# Patient Record
Sex: Female | Born: 1945
Health system: Southern US, Community
[De-identification: ages and names within clinical notes are randomized; demographics above are authoritative.]

## PROBLEM LIST (undated history)

## (undated) DIAGNOSIS — E785 Hyperlipidemia, unspecified: Secondary | ICD-10-CM

## (undated) DIAGNOSIS — M797 Fibromyalgia: Secondary | ICD-10-CM

## (undated) DIAGNOSIS — F32A Depression, unspecified: Secondary | ICD-10-CM

## (undated) DIAGNOSIS — N189 Chronic kidney disease, unspecified: Secondary | ICD-10-CM

## (undated) DIAGNOSIS — R519 Headache, unspecified: Secondary | ICD-10-CM

## (undated) DIAGNOSIS — F419 Anxiety disorder, unspecified: Secondary | ICD-10-CM

## (undated) DIAGNOSIS — F329 Major depressive disorder, single episode, unspecified: Secondary | ICD-10-CM

## (undated) DIAGNOSIS — K635 Polyp of colon: Secondary | ICD-10-CM

## (undated) DIAGNOSIS — N8110 Cystocele, unspecified: Secondary | ICD-10-CM

## (undated) DIAGNOSIS — I10 Essential (primary) hypertension: Secondary | ICD-10-CM

## (undated) DIAGNOSIS — R51 Headache: Secondary | ICD-10-CM

## (undated) DIAGNOSIS — E119 Type 2 diabetes mellitus without complications: Secondary | ICD-10-CM

## (undated) HISTORY — DX: Depression, unspecified: F32.A

## (undated) HISTORY — DX: Cystocele, unspecified: N81.10

## (undated) HISTORY — DX: Headache: R51

## (undated) HISTORY — DX: Type 2 diabetes mellitus without complications: E11.9

## (undated) HISTORY — DX: Chronic kidney disease, unspecified: N18.9

## (undated) HISTORY — DX: Hyperlipidemia, unspecified: E78.5

## (undated) HISTORY — DX: Polyp of colon: K63.5

## (undated) HISTORY — DX: Fibromyalgia: M79.7

## (undated) HISTORY — DX: Headache, unspecified: R51.9

## (undated) HISTORY — DX: Essential (primary) hypertension: I10

## (undated) HISTORY — DX: Anxiety disorder, unspecified: F41.9

---
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
## 1967-08-05 HISTORY — PX: WRIST SURGERY: SHX841

## 1998-07-19 ENCOUNTER — Other Ambulatory Visit: Admission: RE | Admit: 1998-07-19 | Discharge: 1998-07-19 | Payer: Self-pay | Admitting: *Deleted

## 1999-01-30 ENCOUNTER — Ambulatory Visit (HOSPITAL_COMMUNITY): Admission: RE | Admit: 1999-01-30 | Discharge: 1999-01-30 | Payer: Self-pay | Admitting: Gastroenterology

## 1999-07-22 ENCOUNTER — Other Ambulatory Visit: Admission: RE | Admit: 1999-07-22 | Discharge: 1999-07-22 | Payer: Self-pay | Admitting: *Deleted

## 2000-02-14 ENCOUNTER — Other Ambulatory Visit: Admission: RE | Admit: 2000-02-14 | Discharge: 2000-02-14 | Payer: Self-pay | Admitting: *Deleted

## 2000-08-11 ENCOUNTER — Other Ambulatory Visit: Admission: RE | Admit: 2000-08-11 | Discharge: 2000-08-11 | Payer: Self-pay | Admitting: *Deleted

## 2001-09-06 ENCOUNTER — Other Ambulatory Visit: Admission: RE | Admit: 2001-09-06 | Discharge: 2001-09-06 | Payer: Self-pay | Admitting: *Deleted

## 2002-09-08 ENCOUNTER — Other Ambulatory Visit: Admission: RE | Admit: 2002-09-08 | Discharge: 2002-09-08 | Payer: Self-pay | Admitting: *Deleted

## 2003-04-21 ENCOUNTER — Encounter: Payer: Self-pay | Admitting: Gastroenterology

## 2003-04-21 ENCOUNTER — Encounter: Admission: RE | Admit: 2003-04-21 | Discharge: 2003-04-21 | Payer: Self-pay | Admitting: Gastroenterology

## 2003-05-12 ENCOUNTER — Ambulatory Visit (HOSPITAL_COMMUNITY): Admission: RE | Admit: 2003-05-12 | Discharge: 2003-05-12 | Payer: Self-pay | Admitting: Gastroenterology

## 2007-08-19 ENCOUNTER — Other Ambulatory Visit: Admission: RE | Admit: 2007-08-19 | Discharge: 2007-08-19 | Payer: Self-pay | Admitting: Family Medicine

## 2008-08-14 LAB — CONVERTED CEMR LAB: Pap Smear: NORMAL

## 2008-10-31 ENCOUNTER — Other Ambulatory Visit: Admission: RE | Admit: 2008-10-31 | Discharge: 2008-10-31 | Payer: Self-pay | Admitting: Family Medicine

## 2009-01-04 ENCOUNTER — Encounter: Admission: RE | Admit: 2009-01-04 | Discharge: 2009-01-04 | Payer: Self-pay | Admitting: Family Medicine

## 2009-07-25 ENCOUNTER — Encounter: Payer: Self-pay | Admitting: Family Medicine

## 2009-07-25 ENCOUNTER — Ambulatory Visit: Payer: Self-pay | Admitting: Family Medicine

## 2009-07-25 DIAGNOSIS — I152 Hypertension secondary to endocrine disorders: Secondary | ICD-10-CM | POA: Insufficient documentation

## 2009-07-25 DIAGNOSIS — I10 Essential (primary) hypertension: Secondary | ICD-10-CM | POA: Insufficient documentation

## 2009-07-25 DIAGNOSIS — E1129 Type 2 diabetes mellitus with other diabetic kidney complication: Secondary | ICD-10-CM | POA: Insufficient documentation

## 2009-07-25 DIAGNOSIS — N952 Postmenopausal atrophic vaginitis: Secondary | ICD-10-CM

## 2009-07-25 DIAGNOSIS — E785 Hyperlipidemia, unspecified: Secondary | ICD-10-CM

## 2009-07-25 DIAGNOSIS — Z860100 Personal history of colon polyps, unspecified: Secondary | ICD-10-CM | POA: Insufficient documentation

## 2009-07-25 DIAGNOSIS — M79609 Pain in unspecified limb: Secondary | ICD-10-CM

## 2009-07-26 LAB — CONVERTED CEMR LAB
AST: 19 units/L (ref 0–37)
Albumin: 3.8 g/dL (ref 3.5–5.2)
Chloride: 110 meq/L (ref 96–112)
Cholesterol: 125 mg/dL (ref 0–200)
GFR calc non Af Amer: 59.77 mL/min (ref >60)
Hgb A1c MFr Bld: 6.6 % — ABNORMAL HIGH (ref 4.6–6.5)
LDL Cholesterol: 56 mg/dL (ref 0–99)
Potassium: 4.7 meq/L (ref 3.5–5.1)
Sodium: 144 meq/L (ref 135–145)
Total CHOL/HDL Ratio: 4
Total Protein: 7.5 g/dL (ref 6.0–8.3)

## 2009-11-21 ENCOUNTER — Ambulatory Visit: Payer: Self-pay | Admitting: Family Medicine

## 2009-11-21 DIAGNOSIS — R5381 Other malaise: Secondary | ICD-10-CM | POA: Insufficient documentation

## 2009-11-21 DIAGNOSIS — R5383 Other fatigue: Secondary | ICD-10-CM

## 2009-11-21 DIAGNOSIS — G4700 Insomnia, unspecified: Secondary | ICD-10-CM

## 2009-11-21 DIAGNOSIS — F5104 Psychophysiologic insomnia: Secondary | ICD-10-CM | POA: Insufficient documentation

## 2009-11-21 LAB — CONVERTED CEMR LAB
Basophils Absolute: 0 10*3/uL (ref 0.0–0.1)
CO2: 30 meq/L (ref 19–32)
Calcium: 10 mg/dL (ref 8.4–10.5)
Chloride: 111 meq/L (ref 96–112)
Creatinine, Ser: 1 mg/dL (ref 0.4–1.2)
Eosinophils Relative: 5.9 % — ABNORMAL HIGH (ref 0.0–5.0)
Glucose, Bld: 91 mg/dL (ref 70–99)
HCT: 35.3 % — ABNORMAL LOW (ref 36.0–46.0)
Hemoglobin: 12.1 g/dL (ref 12.0–15.0)
Hgb A1c MFr Bld: 6.6 % — ABNORMAL HIGH (ref 4.6–6.5)
Lymphs Abs: 1.7 10*3/uL (ref 0.7–4.0)
MCHC: 34.3 g/dL (ref 30.0–36.0)
Monocytes Relative: 8.7 % (ref 3.0–12.0)
Neutrophils Relative %: 60.9 % (ref 43.0–77.0)
Platelets: 284 10*3/uL (ref 150.0–400.0)
Potassium: 5.7 meq/L — ABNORMAL HIGH (ref 3.5–5.1)
RDW: 13.7 % (ref 11.5–14.6)
Sodium: 148 meq/L — ABNORMAL HIGH (ref 135–145)
TSH: 3.28 microintl units/mL (ref 0.35–5.50)
WBC: 7.3 10*3/uL (ref 4.5–10.5)

## 2009-11-30 ENCOUNTER — Ambulatory Visit: Payer: Self-pay | Admitting: Family Medicine

## 2009-11-30 DIAGNOSIS — E875 Hyperkalemia: Secondary | ICD-10-CM

## 2009-12-04 LAB — CONVERTED CEMR LAB
BUN: 17 mg/dL (ref 6–23)
Chloride: 107 meq/L (ref 96–112)
Creatinine, Ser: 0.9 mg/dL (ref 0.4–1.2)
Potassium: 5 meq/L (ref 3.5–5.1)

## 2010-01-02 ENCOUNTER — Ambulatory Visit: Payer: Self-pay | Admitting: Family Medicine

## 2010-01-02 DIAGNOSIS — J209 Acute bronchitis, unspecified: Secondary | ICD-10-CM | POA: Insufficient documentation

## 2010-01-09 ENCOUNTER — Telehealth: Payer: Self-pay | Admitting: Family Medicine

## 2010-02-11 ENCOUNTER — Telehealth: Payer: Self-pay | Admitting: Family Medicine

## 2010-02-12 ENCOUNTER — Encounter: Payer: Self-pay | Admitting: Family Medicine

## 2010-02-20 ENCOUNTER — Telehealth (INDEPENDENT_AMBULATORY_CARE_PROVIDER_SITE_OTHER): Payer: Self-pay | Admitting: *Deleted

## 2010-02-21 ENCOUNTER — Ambulatory Visit: Payer: Self-pay | Admitting: Family Medicine

## 2010-02-21 ENCOUNTER — Telehealth (INDEPENDENT_AMBULATORY_CARE_PROVIDER_SITE_OTHER): Payer: Self-pay | Admitting: *Deleted

## 2010-02-24 LAB — CONVERTED CEMR LAB
Bilirubin, Direct: 0.1 mg/dL (ref 0.0–0.3)
Chloride: 110 meq/L (ref 96–112)
Cholesterol: 169 mg/dL (ref 0–200)
Creatinine,U: 230.8 mg/dL
Glucose, Bld: 125 mg/dL — ABNORMAL HIGH (ref 70–99)
Hgb A1c MFr Bld: 7.1 % — ABNORMAL HIGH (ref 4.6–6.5)
LDL Cholesterol: 97 mg/dL (ref 0–99)
Microalb, Ur: 2.6 mg/dL — ABNORMAL HIGH (ref 0.0–1.9)
Potassium: 4.8 meq/L (ref 3.5–5.1)
Sodium: 145 meq/L (ref 135–145)
Total CHOL/HDL Ratio: 5
Triglycerides: 197 mg/dL — ABNORMAL HIGH (ref 0.0–149.0)
VLDL: 39.4 mg/dL (ref 0.0–40.0)

## 2010-02-28 ENCOUNTER — Ambulatory Visit: Payer: Self-pay | Admitting: Family Medicine

## 2010-02-28 ENCOUNTER — Other Ambulatory Visit: Admission: RE | Admit: 2010-02-28 | Discharge: 2010-02-28 | Payer: Self-pay | Admitting: Family Medicine

## 2010-03-06 ENCOUNTER — Encounter: Payer: Self-pay | Admitting: Family Medicine

## 2010-03-06 LAB — CONVERTED CEMR LAB: Pap Smear: NEGATIVE

## 2010-05-09 ENCOUNTER — Encounter: Payer: Self-pay | Admitting: Family Medicine

## 2010-05-31 ENCOUNTER — Ambulatory Visit: Payer: Self-pay | Admitting: Family Medicine

## 2010-05-31 LAB — CONVERTED CEMR LAB
HDL: 30.8 mg/dL — ABNORMAL LOW (ref >39.00)
Hgb A1c MFr Bld: 7.1 % — ABNORMAL HIGH (ref 4.6–6.5)
Total CHOL/HDL Ratio: 6
Triglycerides: 140 mg/dL (ref 0.0–149.0)

## 2010-06-03 ENCOUNTER — Ambulatory Visit: Payer: Self-pay | Admitting: Family Medicine

## 2010-07-10 ENCOUNTER — Encounter: Payer: Self-pay | Admitting: Family Medicine

## 2010-07-25 ENCOUNTER — Telehealth: Payer: Self-pay | Admitting: Family Medicine

## 2010-08-06 ENCOUNTER — Ambulatory Visit
Admission: RE | Admit: 2010-08-06 | Discharge: 2010-08-06 | Payer: Self-pay | Source: Home / Self Care | Attending: Internal Medicine | Admitting: Internal Medicine

## 2010-08-06 DIAGNOSIS — M542 Cervicalgia: Secondary | ICD-10-CM | POA: Insufficient documentation

## 2010-08-16 ENCOUNTER — Ambulatory Visit
Admission: RE | Admit: 2010-08-16 | Discharge: 2010-08-16 | Payer: Self-pay | Source: Home / Self Care | Attending: Family Medicine | Admitting: Family Medicine

## 2010-08-21 ENCOUNTER — Ambulatory Visit (HOSPITAL_COMMUNITY)
Admission: RE | Admit: 2010-08-21 | Discharge: 2010-08-21 | Payer: Self-pay | Source: Home / Self Care | Attending: Family Medicine | Admitting: Family Medicine

## 2010-08-22 ENCOUNTER — Telehealth: Payer: Self-pay | Admitting: Family Medicine

## 2010-09-03 NOTE — Assessment & Plan Note (Signed)
Summary: PAP SMEAR AND CPX/CLE   Vital Signs:  Patient profile:   65 year old female Height:      64 inches Weight:      136 pounds BMI:     23.43 Temp:     97.6 degrees F oral Pulse rate:   60 / minute Pulse rhythm:   regular BP sitting:   100 / 66  (right arm) Cuff size:   regular  Vitals Entered By: Linde Gillis CMA Duncan Dull) (February 28, 2010 9:08 AM) CC: complete physical with pap   History of Present Illness: 65 yo here for CPX, pap.    DM II- diagnosed 10 years ago.  On Metformin 1500 mg daily, Januvia 100 mg daily, Glipizide 2.5 mg daily. Dilated eye exam 09/2007, no retinopathy. Hga1c increased this month to 7.1 (was 6.6 in 11/2009).    Had pneumovax on 08/19/2007. Admits to not walking as much or watching her diet.    HTN- at goal for diabetic. On Lisinopril 10 mg daily. No CP, SOB, blurred vision or headaches.  HLD- Stopped taking Simvastatin on her own 3 weeks ago because of myalgias.  Improved since she stopped taking it.  TG elevated, HDL low.  Well woman- no h/o abnormal pap smears.  Mammogram normal this month. Due for zostavax.  Current Medications (verified): 1)  Metformin Hcl 1000 Mg Tabs (Metformin Hcl) .... Take One Tablet By Mouth Once Daily 2)  Metformin Hcl 500 Mg Tabs (Metformin Hcl) .... Take One Tablet By Mouth Once Daily 3)  Januvia 100 Mg Tabs (Sitagliptin Phosphate) .... Take One Tablet By Mouth Once Daily 4)  Glipizide Xl 2.5 Mg Xr24h-Tab (Glipizide) .... Take One Tablet By Mouth Once Daily 5)  Lisinopril 10 Mg Tabs (Lisinopril) .... Take One Tablet By Mouth Once Daily 6)  Lorazepam 0.5 Mg Tabs (Lorazepam) .Marland Kitchen.. 1 Tab By Mouth At Bedtime As Needed Insomnia 7)  Niaspan 500 Mg Tbcr (Niacin (Antihyperlipidemic)) .Marland Kitchen.. 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 Never Smoked Drug use-no Regular exercise-yes  Risk Factors: Exercise: yes (07/25/2009)  Risk Factors: Smoking Status: never (07/25/2009)  Review of Systems      See HPI General:  Denies malaise. Eyes:  Denies blurring. ENT:  Denies difficulty swallowing. CV:  Denies chest pain or discomfort. Resp:  Denies shortness of breath. GI:  Denies abdominal pain, nausea, and vomiting. GU:  Denies abnormal vaginal bleeding, discharge, and dysuria. MS:  Denies joint pain, joint redness, and joint swelling. Derm:  Denies rash. Neuro:  Denies headaches. Psych:  Denies anxiety and depression. Endo:  Denies cold intolerance, excessive thirst, excessive urination, and heat intolerance. Heme:  Denies abnormal bruising and bleeding.  Physical Exam  General:  Well-developed,well-nourished,in no acute distress; alert,appropriate and cooperative throughout examination  Head:  Normocephalic and atraumatic without obvious abnormalities. No apparent alopecia or balding. Eyes:  vision grossly intact, pupils equal, pupils round, and pupils reactive to light.   Ears:  R ear normal and L ear normal.   Nose:  no external deformity.   Mouth:  good dentition.   Neck:  No deformities, masses, or tenderness noted. Chest Wall:  No deformities, masses, or tenderness noted. Breasts:  No mass, nodules, thickening, tenderness, bulging, retraction, inflamation, nipple discharge or skin changes noted.   Lungs:  Normal respiratory effort, chest expands symmetrically. Lungs are clear to auscultation, no crackles or wheezes. Heart:  Normal rate and regular rhythm. S1 and S2 normal without gallop, murmur, click, rub or other extra  sounds. Abdomen:  Bowel sounds positive,abdomen soft and non-tender without masses, organomegaly or hernias noted. Rectal:  no external abnormalities, no hemorrhoids, and normal sphincter tone.   Genitalia:  Pelvic Exam:        External: normal female genitalia without lesions or masses        Vagina: normal without lesions or masses        Cervix: normal without lesions or masses        Adnexa: normal bimanual exam without masses or fullness        Uterus: normal by palpation        Pap smear: performed Msk:  No deformity or scoliosis noted of thoracic or lumbar spine.   Feet- callusnormal ROM, no joint tenderness, and no joint swelling.   Extremities:  no edema Neurologic:  alert & oriented X3 and gait normal.   Skin:  Intact without suspicious lesions or rashes Psych:  Cognition and judgment appear intact. Alert and cooperative with normal attention span and concentration. No apparent delusions, illusions, hallucinations  Diabetes Management Exam:    Foot Exam (with socks and/or shoes not present):       Sensory-Pinprick/Light touch:          Left medial foot (L-4): normal          Left dorsal foot (L-5): normal          Left lateral foot (S-1): normal          Right medial foot (L-4): normal          Right dorsal foot (L-5): normal          Right lateral foot (S-1): normal       Sensory-Monofilament:          Left foot: normal          Right foot: normal       Inspection:          Left foot: normal          Right foot: normal       Nails:          Left foot: normal          Right foot: normal   Impression & Recommendations:  Problem # 1:  Preventive Health Care (ICD-V70.0) Reviewed preventive care protocols, scheduled due services, and updated immunizations Discussed nutrition, exercise, diet, and healthy lifestyle.  Set up DEXA today. Pap today.  Problem # 2:  DIABETES MELLITUS, TYPE II (ICD-250.00) Assessment: Deteriorated Discussed dietary changes, follow up in  one month. Her updated medication list for this problem includes:    Metformin Hcl 1000 Mg Tabs (Metformin hcl) .Marland Kitchen... Take one tablet by mouth once daily    Metformin Hcl 500 Mg Tabs (Metformin hcl) .Marland Kitchen... Take one tablet by mouth once daily    Januvia 100 Mg Tabs (Sitagliptin phosphate) .Marland Kitchen... Take one tablet by mouth once daily    Glipizide Xl 2.5 Mg Xr24h-tab (Glipizide) .Marland Kitchen... Take one tablet by mouth once daily    Lisinopril 10 Mg Tabs (Lisinopril) .Marland Kitchen... Take one tablet by mouth  once daily  Problem # 3:  HYPERLIPIDEMIA (ICD-272.4) Assessment: Unchanged Stop Simvastatin (pt already stopped it). Start Niaspan, follow up in one month. The following medications were removed from the medication list:    Simvastatin 40 Mg Tabs (Simvastatin) .Marland Kitchen... Take one table by mouth once daily Her updated medication list for this problem includes:    Niaspan 500 Mg Tbcr (Niacin (antihyperlipidemic)) .Marland Kitchen... 1 tab by mouth nightly  Complete Medication List: 1)  Metformin Hcl 1000 Mg Tabs (Metformin hcl) .... Take one tablet by mouth once daily 2)  Metformin Hcl 500 Mg Tabs (Metformin hcl) .... Take one tablet by mouth once daily 3)  Januvia 100 Mg Tabs (Sitagliptin phosphate) .... Take one tablet by mouth once daily 4)  Glipizide Xl 2.5 Mg Xr24h-tab (Glipizide) .... Take one tablet by mouth once daily 5)  Lisinopril 10 Mg Tabs (Lisinopril) .... Take one tablet by mouth once daily 6)  Lorazepam 0.5 Mg Tabs (Lorazepam) .Marland Kitchen.. 1 tab by mouth at bedtime as needed insomnia 7)  Niaspan 500 Mg Tbcr (Niacin (antihyperlipidemic)) .Marland Kitchen.. 1 tab by mouth nightly  Other Orders: Radiology Referral (Radiology)  Patient Instructions: 1)  Start taking Niaspan. 2)  Come in to see me in 3 months, fasting labs prior to appointment (a1c 250.00), fasting lipid panel (272.4). 3)  Stop by to see Aram Beecham on your way out to set up your bone density. 4)    Decrease added sugars, eliminate trans fats, increase fiber and limit alcohol.   All these changes together can drop cholesterol  by almost 50%. Prescriptions: NIASPAN 500 MG TBCR (NIACIN (ANTIHYPERLIPIDEMIC)) 1 tab by mouth nightly  #30 x 1   Entered and Authorized by:   Ruthe Mannan MD   Signed by:   Ruthe Mannan MD on 02/28/2010   Method used:   Electronically to        [facility]* (retail)       [address] [place], [place]  [address]       Ph: [phone] or [phone]       Fax: [phone]   RxID:   [id number]   Current Allergies (reviewed today): No known allergies   Last Mammogram:  normal (10/13/2008 11:43:21 AM) Mammogram Result Date:  02/12/2010 Mammogram Result:  normal Mammogram Next Due:  1 yr     Prevention & Chronic Care Immunizations   Influenza vaccine: Not documented    Tetanus booster: 08/19/2007: historical   Tetanus booster due: 08/18/2017    Pneumococcal vaccine: Not documented    H. zoster vaccine: Not documented   H. zoster vaccine deferral: Deferred  (02/28/2010)  Colorectal Screening   Hemoccult: Not documented   Hemoccult due: Not Indicated    Colonoscopy: polyps  (01/26/2008)   Colonoscopy due: 01/25/2013  Other Screening   Pap smear: normal  (08/14/2008)   Pap smear action/deferral: Ordered  (02/28/2010)   Pap smear due: 08/14/2010    Mammogram: normal  (02/12/2010)   Mammogram due: 02/13/2011    DXA bone density scan: Not documented   DXA bone density action/deferral: Ordered  (02/28/2010)   Smoking status: never  (07/25/2009)  Diabetes Mellitus   HgbA1C: 7.1  (02/21/2010)   Hemoglobin A1C due: 05/24/2010    Eye exam: normal  (11/10/2007)   Eye exam due: 11/2008    Foot exam: yes  (02/28/2010)   High risk foot: Not documented  Foot care education: Not documented   Foot exam due: 07/25/2010    Urine microalbumin/creatinine ratio: 1.1  (02/21/2010)   Urine microalbumin/cr due: 02/22/2011    Diabetes flowsheet reviewed?:  Yes   Progress toward A1C goal: Deteriorated  Lipids   Total Cholesterol: 169  (02/21/2010)   LDL: 97  (02/21/2010)   LDL Direct: Not documented   HDL: 32.40  (02/21/2010)   Triglycerides: 197.0  (02/21/2010)    SGOT (AST): 20  (02/21/2010)   SGPT (ALT): 20  (02/21/2010)   Alkaline phosphatase: 67  (02/21/2010)   Total bilirubin: 0.4  (02/21/2010)    Lipid flowsheet reviewed?: Yes   Progress toward LDL goal: Deteriorated    Stage of readiness to change (lipid management): Action  Hypertension   Last Blood Pressure: 100 / 66  (02/28/2010)   Serum creatinine: 0.9  (02/21/2010)   Serum potassium 4.8  (02/21/2010)  Self-Management Support :    Diabetes self-management support: Not documented    Hypertension self-management support: Not documented    Lipid self-management support: Not documented    Nursing Instructions: Pap smear today Schedule screening DXA bone density scan (see order)

## 2010-09-03 NOTE — Progress Notes (Signed)
Summary: wants to stop simvastatin  Phone Note Call from Patient Call back at Home Phone [phone]   Caller: Patient Call For: Ruthe Mannan MD Summary of Call: Pt has been having a lot of muscle pain and wants to stop taking her simvastin to see if the pain improves.  She has an appt with you at the end of the month and will follow up then.  Please advise. Initial call taken by: Lowella Petties CMA,  February 11, 2010 4:36 PM  Follow-up for Phone Call        ok, stopping it for 2 weeks should not be a problem. Ruthe Mannan MD  February 12, 2010 7:33 AM  Left message on machine for patient to return call.  Linde Gillis CMA Duncan Dull)  February 12, 2010 7:59 AM   Left message with spouse to have patient return call.  Linde Gillis CMA Duncan Dull)  February 12, 2010 2:09 PM   Additional Follow-up for Phone Call Additional follow up Details #1::        Advised pt. Additional Follow-up by: Lowella Petties CMA,  February 12, 2010 3:53 PM

## 2010-09-03 NOTE — Progress Notes (Signed)
----   Converted from flag ---- ---- 02/21/2010 9:18 AM, Ruthe Mannan MD wrote: yes  ---- 02/21/2010 9:06 AM, Liane Comber CMA (AAMA) wrote: pt asks about having a urine microalbumin done, is this ok? we did collect urine just in case. Thanks ------------------------------

## 2010-09-03 NOTE — Assessment & Plan Note (Signed)
Summary: 3 M F/U DLO   Vital Signs:  Patient profile:   65 year old female Height:      64 inches Weight:      139 pounds BMI:     23.95 Temp:     98.2 degrees F oral Pulse rate:   64 / minute Pulse rhythm:   regular BP sitting:   102 / 64  (left arm) Cuff size:   regular  Vitals Entered By: Linde Gillis CMA Duncan Dull) (June 03, 2010 9:04 AM) CC: 3 month follow up   History of Present Illness: 65 yo here for follow up DM, HLD. DM II- diagnosed 10 years ago.  On Metformin 500 mg daily, Januvia 100 mg daily, Glipizide 2.5 mg daily. Dilated eye exam 09/2007, no retinopathy. Hga1c remains elevated at 7.1.    Had pneumovax on 08/19/2007. Admits to not walking as much or watching her diet.    HTN- at goal for a diabetic. On Lisinopril 10 mg daily. No CP, SOB, blurred vision or headaches.  HLD-Could not tolerate Niaspan or statins.  LDL not at goal for a diabetic.   Admits to not eating well,under a great deal of stress at work.  Wants to retired early and hopes this will help her lipids and diabetes management.    Current Medications (verified): 1)  Metformin Hcl 500 Mg Tabs (Metformin Hcl) .... Take One Tablet By Mouth Two Times A Day 2)  Januvia 100 Mg Tabs (Sitagliptin Phosphate) .... Take One Tablet By Mouth Once Daily 3)  Glipizide Xl 2.5 Mg Xr24h-Tab (Glipizide) .... Take One Tablet By Mouth Once Daily 4)  Lisinopril 10 Mg Tabs (Lisinopril) .... Take One Tablet By Mouth Once Daily 5)  Lorazepam 0.5 Mg Tabs (Lorazepam) .Marland Kitchen.. 1 Tab By Mouth At Bedtime As Needed Insomnia  Allergies (verified): 1)  ! 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 Never Smoked Drug use-no Regular exercise-yes  Risk Factors: Exercise: yes (07/25/2009)  Risk Factors: Smoking Status: never (07/25/2009)  Review of Systems      See HPI General:  Denies malaise. Eyes:  Denies blurring. ENT:  Denies difficulty swallowing. CV:  Denies chest pain or discomfort. GU:  Denies urinary frequency and urinary hesitancy.  Physical Exam  General:  Well-developed,well-nourished,in no acute distress; alert,appropriate and cooperative throughout examination  Head:  Normocephalic and atraumatic without obvious abnormalities. No apparent alopecia or balding. Eyes:  vision grossly intact, pupils equal, pupils round, and pupils reactive to light.   Ears:  R ear normal and L ear normal.   Nose:  no external deformity.   Mouth:  good dentition.   Lungs:  Normal respiratory effort, chest expands symmetrically. Lungs are clear to auscultation, no crackles or wheezes. Heart:  Normal rate and regular rhythm. S1 and S2 normal without gallop, murmur, click, rub or other extra sounds. Extremities:  no edema Psych:  Cognition and judgment appear intact. Alert and cooperative with normal attention span and concentration. No apparent delusions, illusions, hallucinations   Impression & Recommendations:  Problem # 1:  DIABETES MELLITUS, TYPE II (ICD-250.00) Assessment Unchanged Still not at goal.  Will increase Metformin to 500 mg  two times a day.  Follow up in 3 months.  The following medications were removed from the medication list:    Metformin Hcl 1000 Mg Tabs (Metformin hcl) .Marland Kitchen... Take one tablet by mouth once daily Her updated medication list for this problem includes:    Metformin Hcl 500 Mg Tabs (Metformin hcl) .Marland Kitchen... Take one tablet by mouth two times a day    Januvia 100 Mg Tabs (Sitagliptin phosphate) .Marland Kitchen... Take one tablet by mouth once daily    Glipizide Xl 2.5 Mg Xr24h-tab  (Glipizide) .Marland Kitchen... Take one tablet by mouth once daily    Lisinopril 10 Mg Tabs (Lisinopril) .Marland Kitchen... Take one tablet by mouth once daily  Problem # 2:  HYPERTENSION (ICD-401.9) Assessment: Unchanged At goal, continue Lisinopril 10 mg daily.   Her updated medication list for this problem includes:    Lisinopril 10 Mg Tabs (Lisinopril) .Marland Kitchen... Take one tablet by mouth once daily  Problem # 3:  HYPERLIPIDEMIA (ICD-272.4) Assessment: Deteriorated Could not tolerate statins or Niacin.  Wants to work on diet.  Follow up in 3 months. The following medications were removed from the medication list:    Niaspan 500 Mg Tbcr (Niacin (antihyperlipidemic)) .Marland Kitchen... 1 tab by mouth nightly  Complete Medication List: 1)  Metformin Hcl 500 Mg Tabs (Metformin hcl) .... Take one tablet by mouth two times a day 2)  Januvia 100 Mg Tabs (Sitagliptin phosphate) .... Take one tablet by mouth once daily 3)  Glipizide Xl 2.5 Mg Xr24h-tab (Glipizide) .... Take one tablet by mouth once daily 4)  Lisinopril 10 Mg Tabs (Lisinopril) .... Take one tablet by mouth once daily 5)  Lorazepam 0.5 Mg Tabs (Lorazepam) .Marland Kitchen.. 1 tab by mouth at bedtime as needed insomnia  Patient Instructions: 1)  please make an appointment to come see me in 3 months. Prescriptions: METFORMIN HCL 500 MG TABS (METFORMIN HCL) take one tablet by mouth two times a day  #60 x 3   Entered and Authorized by:   Ruthe Mannan MD   Signed by:   Ruthe Mannan MD on 06/03/2010   Method used:   Electronically to        [facility]* (retail)       [address] [place], [place]  [address]       Ph: [phone] or [phone]       Fax: [phone]   RxID:   [id number]    Orders Added: 1)  Est. Patient Level IV [50093]    Current Allergies (reviewed today): ! NIACIN

## 2010-09-03 NOTE — Miscellaneous (Signed)
Summary: Controlled Substance Agreement  Controlled Substance Agreement   Imported By: Lanelle Bal 01/09/2010 08:38:44  _____________________________________________________________________  External Attachment:    Type:   Image     Comment:   External Document

## 2010-09-03 NOTE — Miscellaneous (Signed)
Summary: Vaccine Record/[facility]  Vaccine Record/[facility]   Imported By: Lanelle Bal 08/07/2009 08:58:54  _____________________________________________________________________  External Attachment:    Type:   Image     Comment:   External Document

## 2010-09-03 NOTE — Letter (Signed)
Summary: Records Dated 12-16-06 thru 02-09-08/[facility]   Records Dated 12-16-06 thru 02-09-08/[facility]   Imported By: Lanelle Bal 08/07/2009 08:57:21  _____________________________________________________________________  External Attachment:    Type:   Image     Comment:   External Document

## 2010-09-03 NOTE — Progress Notes (Signed)
Summary: still has cough  Phone Note Call from Patient Call back at Work Phone [phone]   Caller: Patient Call For: Ruthe Mannan MD Summary of Call: Pt was seen for bronchitis on 6/1.  She is better but still has cough with yellow mucous.  No fever.  Should she have more abx?  Uses [facility]. Initial call taken by: Lowella Petties CMA,  January 09, 2010 10:50 AM  Follow-up for Phone Call        I would not take more abx.  Can take over a month for cough to resolve. Ruthe Mannan MD  January 09, 2010 10:54 AM   Additional Follow-up for Phone Call Additional follow up Details #1::        Left message on home number for pt to call.  I tried work number multiple times but it was always busy.   Lowella Petties CMA  January 09, 2010 4:27 PM   Advised pt.              Lowella Petties CMA  January 10, 2010 8:28 AM

## 2010-09-03 NOTE — Letter (Signed)
Summary: Results Follow up Letter  [place] [facility]  [address] [place], [place] [address]   Phone: [phone]  Fax: [phone]    03/06/2010 MRN: [id number]  [facility] [address] [place], [place]  [address]  Dear Ms. Hagemann,  The following are the results of your recent test(s):  Test         Result    Pap Smear:        Normal __X___  Not Normal _____ Comments: ______________________________________________________ Cholesterol: LDL(Bad cholesterol):         Your goal is less than:         HDL (Good cholesterol):       Your goal is more than: Comments:  ______________________________________________________ Mammogram:        Normal _____  Not Normal _____ Comments:  ___________________________________________________________________ Hemoccult:        Normal _____  Not normal _______ Comments:    _____________________________________________________________________ Other Tests:    We routinely do not discuss normal results over the telephone.  If you desire a copy of the results, or you have any questions about this information we can discuss them at your next office visit.   Sincerely,      Ruthe Mannan, MD

## 2010-09-03 NOTE — Progress Notes (Signed)
----   Converted from flag ---- ---- 02/20/2010 4:32 PM, Ruthe Mannan MD wrote: BMET (401.9), flp (272.4), a1c (250.00), liver function tests (272.4)  ---- 02/20/2010 1:07 PM, Mills Koller wrote: Patient is coming in the AM for fasting CPX labs, need orders and dx.Thanks, Terri ------------------------------

## 2010-09-03 NOTE — Letter (Signed)
Summary: Marlaine Hind MD Optometrist  Marlaine Hind MD Optometrist   Imported By: Lanelle Bal 05/21/2010 09:39:53  _____________________________________________________________________  External Attachment:    Type:   Image     Comment:   External Document

## 2010-09-03 NOTE — Miscellaneous (Signed)
Summary: freestyle test strips  Clinical Lists Changes  Medications: Added new medication of * FREESTYLE FREEDOM LYTE TEST STRIPS check blood sugar once a day - Signed Rx of FREESTYLE FREEDOM LYTE TEST STRIPS check blood sugar once a day;  #100 x prn;  Signed;  Entered by: Lowella Petties CMA, AAMA;  Authorized by: Lowella Petties CMA, AAMA;  Method used: Telephoned to [facility]*, [address], [place], [place]  [address], Ph: [phone] or [phone], Fax: [phone]    Prescriptions: FREESTYLE FREEDOM LYTE TEST STRIPS check blood sugar once a day  #100 x prn   Entered and Authorized by:   Lowella Petties CMA, AAMA   Signed by:   Lowella Petties CMA, AAMA on 07/10/2010   Method used:   Telephoned to ...       [facility]* (retail)       [address] [place], [place]  [address]       Ph: [phone] or [phone]       Fax: [phone]   RxID:   [id number]   Prior Medications: METFORMIN HCL 500 MG TABS (METFORMIN HCL) take one tablet by mouth two times a day JANUVIA 100 MG TABS (SITAGLIPTIN PHOSPHATE) take one tablet by mouth once daily GLIPIZIDE XL 2.5 MG XR24H-TAB (GLIPIZIDE) take one tablet by mouth once daily LISINOPRIL 10 MG TABS (LISINOPRIL) take one tablet by mouth once daily LORAZEPAM 0.5 MG TABS (LORAZEPAM) 1 tab by mouth at bedtime as needed insomnia Current Allergies: ! NIACIN

## 2010-09-03 NOTE — Assessment & Plan Note (Signed)
Summary: FOLLOW-UP ON DIABETIC CARE/JRR   Vital Signs:  Patient profile:   65 year old female Height:      64 inches Weight:      136.38 pounds BMI:     23.49 Temp:     98.8 degrees F oral Pulse rate:   84 / minute Pulse rhythm:   regular BP sitting:   104 / 64  (left arm) Cuff size:   regular  Vitals Entered By: Lewanda Rife LPN (November 21, 2009 8:53 AM) CC: follow up on diabetic care   History of Present Illness: 65 yo here for follow DM.  DM II- diagnosed 10 years ago.  Fasting CBGs 90s-110. On Metformin 1500 mg daily, Januvia 100 mg daily, Glipizide 2.5 mg daily. Dilated eye exam 09/2007, no retinopathy. Hga1c was 6.5 in 12/2008, 6.6 in 07/2009.   Had pneumovax on 08/19/2007.  HTN- at goal for diabetic. On Lisinopril 10 mg daily. No CP, SOB, blurred vision or headaches.  Insomnia- difficulty falling asleep for over 6 months.  Has tried everything, including taking her husband's Remus Loffler which worked but made her feel hung over the next day.  Feels like her mind is racing, anxious about things she has to do.  Stopped reading, watching TV or eating before bed.  Once she is asleep, she can stay asleep.  Fatigue- very fatigued lately, not sure if its just from not sleeping well. NO CP or DOE.  No LE edema.  Current Medications (verified): 1)  Metformin Hcl 1000 Mg Tabs (Metformin Hcl) .... Take One Tablet By Mouth Once Daily 2)  Metformin Hcl 500 Mg Tabs (Metformin Hcl) .... Take One Tablet By Mouth Once Daily 3)  Januvia 100 Mg Tabs (Sitagliptin Phosphate) .... Take One Tablet By Mouth Once Daily 4)  Glipizide Xl 2.5 Mg Xr24h-Tab (Glipizide) .... Take One Tablet By Mouth Once Daily 5)  Simvastatin 40 Mg Tabs (Simvastatin) .... Take One Table By Mouth Once Daily 6)  Lisinopril 10 Mg Tabs (Lisinopril) .... Take One Tablet By Mouth Once Daily 7)  Femhrt Low Dose 0.5-2.5 Mg-Mcg Tabs (Norethindrone-Eth Estradiol) .... Take One Tablet By Mouth Once Daily 8)  Lorazepam 0.5 Mg Tabs  (Lorazepam) .Marland Kitchen.. 1 Tab By Mouth At Bedtime As Needed Insomnia  Allergies (verified): No Known Drug Allergies  Review of Systems      See HPI General:  Complains of fatigue; denies chills and fever. Eyes:  Denies blurring. ENT:  Denies difficulty swallowing. CV:  Denies chest pain or discomfort. Resp:  Denies shortness of breath. GI:  Denies abdominal pain and change in bowel habits. GU:  Denies abnormal vaginal bleeding. MS:  Denies muscle weakness. Derm:  Denies rash. Endo:  Denies excessive thirst and excessive urination.  Physical Exam  General:  Well-developed,well-nourished,in no acute distress; alert,appropriate and cooperative throughout examination Eyes:  No corneal or conjunctival inflammation noted. EOMI. Perrla. Funduscopic exam benign, without hemorrhages, exudates or papilledema. Vision grossly normal. Ears:  External ear exam shows no significant lesions or deformities.  Otoscopic examination reveals clear canals, tympanic membranes are intact bilaterally without bulging, retraction, inflammation or discharge. Hearing is grossly normal bilaterally. Mouth:  Oral mucosa and oropharynx without lesions or exudates.  Teeth in good repair. Lungs:  Normal respiratory effort, chest expands symmetrically. Lungs are clear to auscultation, no crackles or wheezes. Heart:  Normal rate and regular rhythm. S1 and S2 normal without gallop, murmur, click, rub or other extra sounds. Abdomen:  Bowel sounds positive,abdomen soft and non-tender without masses,  organomegaly or hernias noted. Extremities:  no edema Psych:  Cognition and judgment appear intact. Alert and cooperative with normal attention span and concentration. No apparent delusions, illusions, hallucinations   Impression & Recommendations:  Problem # 1:  INSOMNIA (ICD-780.52) Assessment New Most likely related to anxiety.  will try low dose lorazepam as needed at bedtime as she does not need something long acting (see  HPI).  Problem # 2:  FATIGUE (ICD-780.79) Assessment: New LIkely related to #1.  will check BMET, CBC, TSH to rule out other causes. Orders: Venipuncture (37169) TLB-BMP (Basic Metabolic Panel-BMET) (80048-METABOL) TLB-CBC Platelet - w/Differential (85025-CBCD) TLB-TSH (Thyroid Stimulating Hormone) (84443-TSH)  Problem # 3:  DIABETES MELLITUS, TYPE II (ICD-250.00) Assessment: Unchanged Recheck TSH today. Her updated medication list for this problem includes:    Metformin Hcl 1000 Mg Tabs (Metformin hcl) .Marland Kitchen... Take one tablet by mouth once daily    Metformin Hcl 500 Mg Tabs (Metformin hcl) .Marland Kitchen... Take one tablet by mouth once daily    Januvia 100 Mg Tabs (Sitagliptin phosphate) .Marland Kitchen... Take one tablet by mouth once daily    Glipizide Xl 2.5 Mg Xr24h-tab (Glipizide) .Marland Kitchen... Take one tablet by mouth once daily    Lisinopril 10 Mg Tabs (Lisinopril) .Marland Kitchen... Take one tablet by mouth once daily  Problem # 4:  HYPERTENSION (ICD-401.9) Assessment: Unchanged stable.  Continue low dose Lisinopril. Her updated medication list for this problem includes:    Lisinopril 10 Mg Tabs (Lisinopril) .Marland Kitchen... Take one tablet by mouth once daily  Complete Medication List: 1)  Metformin Hcl 1000 Mg Tabs (Metformin hcl) .... Take one tablet by mouth once daily 2)  Metformin Hcl 500 Mg Tabs (Metformin hcl) .... Take one tablet by mouth once daily 3)  Januvia 100 Mg Tabs (Sitagliptin phosphate) .... Take one tablet by mouth once daily 4)  Glipizide Xl 2.5 Mg Xr24h-tab (Glipizide) .... Take one tablet by mouth once daily 5)  Simvastatin 40 Mg Tabs (Simvastatin) .... Take one table by mouth once daily 6)  Lisinopril 10 Mg Tabs (Lisinopril) .... Take one tablet by mouth once daily 7)  Femhrt Low Dose 0.5-2.5 Mg-mcg Tabs (Norethindrone-eth estradiol) .... Take one tablet by mouth once daily 8)  Lorazepam 0.5 Mg Tabs (Lorazepam) .Marland Kitchen.. 1 tab by mouth at bedtime as needed insomnia  Other Orders: TLB-A1C / Hgb A1C  (Glycohemoglobin) (83036-A1C) Prescriptions: LORAZEPAM 0.5 MG TABS (LORAZEPAM) 1 tab by mouth at bedtime as needed insomnia  #30 x 0   Entered and Authorized by:   Ruthe Mannan MD   Signed by:   Ruthe Mannan MD on 11/21/2009   Method used:   Print then Give to Patient   RxID:   [id number]   Current Allergies (reviewed today): No known allergies

## 2010-09-03 NOTE — Assessment & Plan Note (Signed)
Summary: ONE WEEK FOLLOW UP/NT   Vital Signs:  Patient profile:   65 year old female Height:      64 inches Weight:      136.38 pounds BMI:     23.49 Temp:     98 degrees F oral Pulse rate:   76 / minute Pulse rhythm:   regular BP sitting:   102 / 72  (left arm) Cuff size:   regular  Vitals Entered By: Delilah Shan CMA Duncan Dull) (November 30, 2009 12:13 PM) CC: One week follow up   History of Present Illness: 65 yo here for follow up labs.  Potassium was elevated at 5.7.  Given 15 g of Kayexelate and advised to return today for follow up. BMET on 07/25/09 showed potassium of 4.7.  Glucose was normal. Admits to eating some Bananas and other foods with potassium but not excessive.  She is on Lisinopril 10 mg daily.  Current Medications (verified): 1)  Metformin Hcl 1000 Mg Tabs (Metformin Hcl) .... Take One Tablet By Mouth Once Daily 2)  Metformin Hcl 500 Mg Tabs (Metformin Hcl) .... Take One Tablet By Mouth Once Daily 3)  Januvia 100 Mg Tabs (Sitagliptin Phosphate) .... Take One Tablet By Mouth Once Daily 4)  Glipizide Xl 2.5 Mg Xr24h-Tab (Glipizide) .... Take One Tablet By Mouth Once Daily 5)  Simvastatin 40 Mg Tabs (Simvastatin) .... Take One Table By Mouth Once Daily 6)  Lisinopril 10 Mg Tabs (Lisinopril) .... Take One Tablet By Mouth Once Daily 7)  Femhrt Low Dose 0.5-2.5 Mg-Mcg Tabs (Norethindrone-Eth Estradiol) .... Take One Tablet By Mouth Once Daily 8)  Lorazepam 0.5 Mg Tabs (Lorazepam) .Marland Kitchen.. 1 Tab By Mouth At Bedtime As Needed Insomnia 9)  Kayexalate  Powd (Sodium Polystyrene Sulfonate) .Marland Kitchen.. 15 Grams Now, Do Not Repeat  Allergies (verified): No Known Drug Allergies  Review of Systems      See HPI CV:  Denies chest pain or discomfort, difficulty breathing at night, difficulty breathing while lying down, fainting, fatigue, leg cramps with exertion, near fainting, palpitations, shortness of breath with exertion, swelling of feet, swelling of hands, and weight gain. Resp:   Denies shortness of breath.  Physical Exam  General:  Well-developed,well-nourished,in no acute distress; alert,appropriate and cooperative throughout examination Lungs:  Normal respiratory effort, chest expands symmetrically. Lungs are clear to auscultation, no crackles or wheezes. Heart:  Normal rate and regular rhythm. S1 and S2 normal without gallop, murmur, click, rub or other extra sounds. Abdomen:  Bowel sounds positive,abdomen soft and non-tender without masses, organomegaly or hernias noted. Extremities:  no edema Psych:  Cognition and judgment appear intact. Alert and cooperative with normal attention span and concentration. No apparent delusions, illusions, hallucinations   Impression & Recommendations:  Problem # 1:  HYPERKALEMIA (ICD-276.7) Assessment New Time spent with patient 25 minutes, more than 50% of this time was spent counseling patient on hyperkalemia.  She is very anxious about the cause.  Renal function is normal and glucose was ok.  I suspect this was a combination of lab error and dietary potassium intake.  Will repeat today, if remains elevated, d/c ACEI.  BP was 102/72 and could certainly tolerate being off the ACEI.  I would like to verify results before doing that given the kidney protection it provides her as a diabetic.  Orders: Venipuncture (16109) TLB-BMP (Basic Metabolic Panel-BMET) (80048-METABOL)  Complete Medication List: 1)  Metformin Hcl 1000 Mg Tabs (Metformin hcl) .... Take one tablet by mouth once daily 2)  Metformin Hcl 500 Mg Tabs (Metformin hcl) .... Take one tablet by mouth once daily 3)  Januvia 100 Mg Tabs (Sitagliptin phosphate) .... Take one tablet by mouth once daily 4)  Glipizide Xl 2.5 Mg Xr24h-tab (Glipizide) .... Take one tablet by mouth once daily 5)  Simvastatin 40 Mg Tabs (Simvastatin) .... Take one table by mouth once daily 6)  Lisinopril 10 Mg Tabs (Lisinopril) .... Take one tablet by mouth once daily 7)  Femhrt Low Dose  0.5-2.5 Mg-mcg Tabs (Norethindrone-eth estradiol) .... Take one tablet by mouth once daily 8)  Lorazepam 0.5 Mg Tabs (Lorazepam) .Marland Kitchen.. 1 tab by mouth at bedtime as needed insomnia 9)  Kayexalate Powd (Sodium polystyrene sulfonate) .Marland Kitchen.. 15 grams now, do not repeat  Current Allergies (reviewed today): No known allergies

## 2010-09-03 NOTE — Assessment & Plan Note (Signed)
Summary: BRONCHITIS?  DLO   Vital Signs:  Patient profile:   65 year old female Height:      64 inches Weight:      136.13 pounds BMI:     23.45 Temp:     98.3 degrees F oral Pulse rate:   72 / minute Pulse rhythm:   regular BP sitting:   120 / 80  (left arm) Cuff size:   regular  Vitals Entered By: Linde Gillis CMA Duncan Dull) (January 02, 2010 2:59 PM) CC: ? bronchitis   History of Present Illness: 65 yo here for 1 1/2 week of progressive URI symptoms.  Started with runny nose, sinus congestion and pressure. Last few days, has worseing cough, productive of thick sputum and wheezing. No fevers or shortness of breath.  OTC cough medicine not helping.    Current Medications (verified): 1)  Metformin Hcl 1000 Mg Tabs (Metformin Hcl) .... Take One Tablet By Mouth Once Daily 2)  Metformin Hcl 500 Mg Tabs (Metformin Hcl) .... Take One Tablet By Mouth Once Daily 3)  Januvia 100 Mg Tabs (Sitagliptin Phosphate) .... Take One Tablet By Mouth Once Daily 4)  Glipizide Xl 2.5 Mg Xr24h-Tab (Glipizide) .... Take One Tablet By Mouth Once Daily 5)  Simvastatin 40 Mg Tabs (Simvastatin) .... Take One Table By Mouth Once Daily 6)  Lisinopril 10 Mg Tabs (Lisinopril) .... Take One Tablet By Mouth Once Daily 7)  Femhrt Low Dose 0.5-2.5 Mg-Mcg Tabs (Norethindrone-Eth Estradiol) .... Take One Tablet By Mouth Once Daily 8)  Lorazepam 0.5 Mg Tabs (Lorazepam) .Marland Kitchen.. 1 Tab By Mouth At Bedtime As Needed Insomnia 9)  Kayexalate  Powd (Sodium Polystyrene Sulfonate) .Marland Kitchen.. 15 Grams Now, Do Not Repeat 10)  Azithromycin 250 Mg  Tabs (Azithromycin) .... 2 By  Mouth Today and Then 1 Daily For 4 Days  Allergies (verified): No Known Drug Allergies  Review of Systems      See HPI General:  Denies fever. CV:  Denies chest pain or discomfort. Resp:  Complains of cough, sputum productive, and wheezing; denies shortness of breath.  Physical Exam  General:  Well-developed,well-nourished,in no acute distress;  alert,appropriate and cooperative throughout examination non toxic appearing, afebrile Ears:  External ear exam shows no significant lesions or deformities.  Otoscopic examination reveals clear canals, tympanic membranes are intact bilaterally without bulging, retraction, inflammation or discharge. Hearing is grossly normal bilaterally. Mouth:  pharyngeal erythema.   Lungs:  normal respiratory effort and no intercostal retractions.   bilateral basal exp wheezes, no crackles. Heart:  Normal rate and regular rhythm. S1 and S2 normal without gallop, murmur, click, rub or other extra sounds. Extremities:  no edema Psych:  Cognition and judgment appear intact. Alert and cooperative with normal attention span and concentration. No apparent delusions, illusions, hallucinations   Impression & Recommendations:  Problem # 1:  ACUTE BRONCHITIS (ICD-466.0) Assessment New Zpack, Ventolin as needed wheezing, cough. See pt instructions for details. Her updated medication list for this problem includes:    Azithromycin 250 Mg Tabs (Azithromycin) .Marland Kitchen... 2 by  mouth today and then 1 daily for 4 days    Proair Hfa 108 (90 Base) Mcg/act Aers (Albuterol sulfate) .Marland Kitchen... 2 inh q4h as needed shortness of breath  Complete Medication List: 1)  Metformin Hcl 1000 Mg Tabs (Metformin hcl) .... Take one tablet by mouth once daily 2)  Metformin Hcl 500 Mg Tabs (Metformin hcl) .... Take one tablet by mouth once daily 3)  Januvia 100 Mg Tabs (Sitagliptin phosphate) .Marland KitchenMarland KitchenMarland Kitchen  Take one tablet by mouth once daily 4)  Glipizide Xl 2.5 Mg Xr24h-tab (Glipizide) .... Take one tablet by mouth once daily 5)  Simvastatin 40 Mg Tabs (Simvastatin) .... Take one table by mouth once daily 6)  Lisinopril 10 Mg Tabs (Lisinopril) .... Take one tablet by mouth once daily 7)  Femhrt Low Dose 0.5-2.5 Mg-mcg Tabs (Norethindrone-eth estradiol) .... Take one tablet by mouth once daily 8)  Lorazepam 0.5 Mg Tabs (Lorazepam) .Marland Kitchen.. 1 tab by mouth at  bedtime as needed insomnia 9)  Kayexalate Powd (Sodium polystyrene sulfonate) .Marland Kitchen.. 15 grams now, do not repeat 10)  Azithromycin 250 Mg Tabs (Azithromycin) .... 2 by  mouth today and then 1 daily for 4 days 11)  Proair Hfa 108 (90 Base) Mcg/act Aers (Albuterol sulfate) .... 2 inh q4h as needed shortness of breath  Patient Instructions: 1)  Use ventolin inhaler - 2 puffs every 4-6 hours as needed for wheezing or cough. 2)  Take Zpack as directed. 3)  Call if no improvement in 5-7 days, sooner if increasing cough, fever, or new symptoms ( shortness of breath, chest pain) .  Prescriptions: AZITHROMYCIN 250 MG  TABS (AZITHROMYCIN) 2 by  mouth today and then 1 daily for 4 days  #6 x 0   Entered and Authorized by:   Ruthe Mannan MD   Signed by:   Ruthe Mannan MD on 01/02/2010   Method used:   Electronically to        [facility]* (retail)       [address] [place], [place]  [address]       Ph: [phone] or [phone]       Fax: [phone]   RxID:   [id number]   Current Allergies (reviewed today): No known allergies

## 2010-09-04 ENCOUNTER — Encounter: Payer: Self-pay | Admitting: Family Medicine

## 2010-09-04 ENCOUNTER — Telehealth: Payer: Self-pay | Admitting: Family Medicine

## 2010-09-05 ENCOUNTER — Ambulatory Visit: Payer: Self-pay | Admitting: Family Medicine

## 2010-09-05 ENCOUNTER — Encounter (INDEPENDENT_AMBULATORY_CARE_PROVIDER_SITE_OTHER): Payer: Self-pay | Admitting: *Deleted

## 2010-09-05 ENCOUNTER — Other Ambulatory Visit (INDEPENDENT_AMBULATORY_CARE_PROVIDER_SITE_OTHER): Payer: BC Managed Care – PPO

## 2010-09-05 ENCOUNTER — Other Ambulatory Visit: Payer: Self-pay | Admitting: Family Medicine

## 2010-09-05 DIAGNOSIS — E119 Type 2 diabetes mellitus without complications: Secondary | ICD-10-CM

## 2010-09-05 LAB — HEMOGLOBIN A1C: Hgb A1c MFr Bld: 6.6 % — ABNORMAL HIGH (ref 4.6–6.5)

## 2010-09-05 LAB — BASIC METABOLIC PANEL
CO2: 27 mEq/L (ref 19–32)
Chloride: 108 mEq/L (ref 96–112)
Creatinine, Ser: 1.1 mg/dL (ref 0.4–1.2)
Potassium: 5.8 mEq/L — ABNORMAL HIGH (ref 3.5–5.1)

## 2010-09-05 NOTE — Progress Notes (Signed)
Summary: refill request for lorazepam  Phone Note Refill Request Message from:  Fax from Pharmacy  Refills Requested: Medication #1:  LORAZEPAM 0.5 MG TABS 1 tab by mouth at bedtime as needed insomnia   Last Refilled: 11/21/2009 Faxed request from [facility], [phone].  Initial call taken by: Lowella Petties CMA, AAMA,  July 25, 2010 4:42 PM  Follow-up for Phone Call        Called to pharmacy. Follow-up by: Lowella Petties CMA, AAMA,  July 26, 2010 8:54 AM    Prescriptions: LORAZEPAM 0.5 MG TABS (LORAZEPAM) 1 tab by mouth at bedtime as needed insomnia  #30 x 0   Entered and Authorized by:   Ruthe Mannan MD   Signed by:   Ruthe Mannan MD on 07/26/2010   Method used:   Telephoned to ...       [facility]* (retail)       [address] [place], [place]  [address]       Ph: [phone] or [phone]       Fax: [phone]   RxID:   [id number]

## 2010-09-05 NOTE — Progress Notes (Signed)
Summary: wants to change from [place]  Phone Note Call from Patient Call back at Home Phone [phone]   Caller: Patient Call For: Ruthe Mannan MD Summary of Call: Pt is asking if she can change from [place] to something else in generic.  She has recently retired and is trying to save money.  Uses [facility].                   Lowella Petties CMA, AAMA  August 22, 2010 12:39 PM   Follow-up for Phone Call        unfortuatenly there is nothing in that class of drug that is generic.  We could increase her metformin or her glipizide.  Other than that, only choice at this point would be to add insulin. Ruthe Mannan MD  August 22, 2010 12:42 PM  Advised pt, she said she will discuss this with you at her next office visit. Follow-up by: Lowella Petties CMA, AAMA,  August 22, 2010 3:36 PM  Additional Follow-up for Phone Call Additional follow up Details #1::        ok. Ruthe Mannan MD  August 22, 2010 3:40 PM

## 2010-09-05 NOTE — Assessment & Plan Note (Signed)
Summary: ONE WK FOLLOW UP / LFW   Vital Signs:  Patient profile:   65 year old female Weight:      143.75 pounds Temp:     98.3 degrees F oral Pulse rate:   78 / minute Pulse rhythm:   regular BP sitting:   104 / 80  (left arm) Cuff size:   regular  Vitals Entered By: Selena Batten Dance CMA (AAMA) (August 16, 2010 2:01 PM) CC: 1 week recheck   History of Present Illness: CC: L neck/shoulder pain  with meds much better now but still feeling discomfort there.  Feels soreness underneath shoulder bone.  No more tingling/numbness of left fingers.  Still feels twinge when turning certain way.  Last visit thought cervical radiculopathy with ? C7 given decreased triceps reflex.  treated conservatively with scheduled NSAID/muscle relaxant for 1 wk.  Cspine xray with DDD at C6-7 and multifacet arthropathy.  No SOB, chest pain or pressure.  No shooting pain down L arm.  No numbness down arms.  No weakness in arms/hands.  Denies recent trauma/injury, although has been packing some heavy crates recently.    stable weight.  no f/c.  not immunosuppressed.  mild reflux with naprosyn, taking with food.  taking tums to help as well.  flexeril not making her sleepy.  Current Medications (verified): 1)  Metformin Hcl 500 Mg Tabs (Metformin Hcl) .... Take One Tablet By Mouth Two Times A Day 2)  Januvia 100 Mg Tabs (Sitagliptin Phosphate) .... Take One Tablet By Mouth Once Daily 3)  Glipizide Xl 2.5 Mg Xr24h-Tab (Glipizide) .... Take One Tablet By Mouth Once Daily 4)  Lisinopril 10 Mg Tabs (Lisinopril) .... Take One Tablet By Mouth Once Daily 5)  Lorazepam 0.5 Mg Tabs (Lorazepam) .Marland Kitchen.. 1 Tab By Mouth At Bedtime As Needed Insomnia 6)  Freestyle Freedom Lyte Test Strips .... Check Blood Sugar Once A Day 7)  Flexeril 10 Mg Tabs (Cyclobenzaprine Hcl) .... Take One By Mouth Two Times A Day X 10 Days Then As Needed Muscle Spasm 8)  Naprosyn 500 Mg Tabs (Naproxen) .... One By Mouth Two Times A Day With Food X 10  Days Then As Needed Neck Pain  Allergies: 1)  ! 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 Never Smoked Drug use-no Regular exercise-yes  Review of Systems       per HPI  Physical Exam  General:  Well-developed,well-nourished,in no acute distress; alert,appropriate and cooperative throughout examination.  improved movement  Mouth:  good dentition.  MMM Neck:  No deformities, masses, or tenderness noted.  see MSK exam Msk:  neck - improved ROM compared to last visit.  no lhermitte phenomenon.  midline no spinal tenderness.  negative spurling now.  + L tight lower cervical paraspinous mm but better than last visit  R shoulder - FROM, no deformity L shoulder - FROM, no pain/weakness with testing of SITS, neg empty can sign.  mild pain with hawkins.  limited motion with scratching back compared to R side.  no wasting/atrophy of muscles Pulses:  2+ rad pulses Neurologic:  2+ DTRs x diminished L triceps reflex.  sensation intact, strength intact.  bilaterally equally strong wrist flexors/extensors, elbow extensors/flexors, and hand intrinsics   Impression & Recommendations:  Problem # 1:  NECK PAIN, LEFT (ICD-723.1) much improved with conservative therapy.  still some L triceps reflex weakness pointing to lower motor neuron issue but no evident weakness on strength testing.  continue conservative therapy for now.  red flags to return discussed as well as to call us if not improving as expected.  Her updated medication list for this problem includes:    Flexeril 10 Mg Tabs (Cyclobenzaprine hcl) .Marland Kitchen... Take one by mouth two times a day x 10 days then as needed muscle spasm    Naprosyn 500 Mg Tabs (Naproxen) ..... One by mouth two times a day with food x 10 days then  as needed neck pain  Complete Medication List: 1)  Metformin Hcl 500 Mg Tabs (Metformin hcl) .... Take one tablet by mouth two times a day 2)  Januvia 100 Mg Tabs (Sitagliptin phosphate) .... Take one tablet by mouth once daily 3)  Glipizide Xl 2.5 Mg Xr24h-tab (Glipizide) .... Take one tablet by mouth once daily 4)  Lisinopril 10 Mg Tabs (Lisinopril) .... Take one tablet by mouth once daily 5)  Lorazepam 0.5 Mg Tabs (Lorazepam) .Marland Kitchen.. 1 tab by mouth at bedtime as needed insomnia 6)  Freestyle Freedom Lyte Test Strips  .... Check blood sugar once a day 7)  Flexeril 10 Mg Tabs (Cyclobenzaprine hcl) .... Take one by mouth two times a day x 10 days then as needed muscle spasm 8)  Naprosyn 500 Mg Tabs (Naproxen) .... One by mouth two times a day with food x 10 days then as needed neck pain  Patient Instructions: 1)  Continue naprosyn and flexeril for the next week, then just use as needed. 2)  Return if not improving as expected or if any worsening (numbness, shooting pains, weakness). 3)  Good to see you today.   Orders Added: 1)  Est. Patient Level III [[address]]    Current Allergies (reviewed today): ! NIACIN

## 2010-09-05 NOTE — Assessment & Plan Note (Signed)
Summary: PAIN DOWN LEFT SIDE OF BODY X 1 WK/   Vital Signs:  Patient profile:   65 year old female Weight:      140.25 pounds Temp:     98.1 degrees F oral Pulse rate:   80 / minute Pulse rhythm:   regular BP sitting:   108 / 78  (left arm) Cuff size:   regular  Vitals Entered By: Selena Batten Dance CMA (AAMA) (August 06, 2010 11:35 AM) CC: Pain in neck and down left arm   History of Present Illness: CC: L neck/shoulder pain  progressively worsening L shoulder/neck pain for 6 wks.  Mainly shoulder, also feels some prickly pain in L hip and in L arm.  + L fingers tingle.  Took advil 800mg  last night.  Has been increasing dose.  Bengay rub helps some as well.  No tylenol.  + L chest soreness, reproducible with palpation.  Started with L neck pain.  Worse with movements.  not pleuritic.  improved with advil.  no current jaw pain.  No SOB, sweating.  + nausea off and on for last 4 months.  No shooting pain down L arm.  No numbness down arms.  No weakness in arms/hands.  Denies recent trauma/injury, although has been packing some heavy crates recently.  + fall 2 years ago, hit back of head, no problems after that.    on lorazepam for insomnia, rarely uses.  doesn't help pain.  hasn't been very active as far as exercise.  stable weight.  no f/c.  not immunosuppressed.  Current Medications (verified): 1)  Metformin Hcl 500 Mg Tabs (Metformin Hcl) .... Take One Tablet By Mouth Two Times A Day 2)  Januvia 100 Mg Tabs (Sitagliptin Phosphate) .... Take One Tablet By Mouth Once Daily 3)  Glipizide Xl 2.5 Mg Xr24h-Tab (Glipizide) .... Take One Tablet By Mouth Once Daily 4)  Lisinopril 10 Mg Tabs (Lisinopril) .... Take One Tablet By Mouth Once Daily 5)  Lorazepam 0.5 Mg Tabs (Lorazepam) .Marland Kitchen.. 1 Tab By Mouth At Bedtime As Needed Insomnia 6)  Freestyle Freedom Lyte Test Strips .... Check Blood Sugar Once A Day  Allergies: 1)  ! 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 Never Smoked Drug use-no Regular exercise-yes  Review of Systems       per HPI  Physical Exam  General:  Well-developed,well-nourished,in no acute distress; alert,appropriate and cooperative throughout examination.  somewhat stiff  Head:  Normocephalic and atraumatic without obvious abnormalities. No apparent alopecia or balding. Mouth:  good dentition.  MMM Neck:  No deformities, masses, or tenderness noted.  see MSK exam Chest Wall:  + tender to palpation L upper outer chest, reproducible Lungs:  Normal respiratory effort, chest expands symmetrically. Lungs are clear to auscultation, no crackles or wheezes. Heart:  Normal rate and regular rhythm. S1 and S2 normal without gallop, murmur, click, rub or other extra sounds. Msk:  neck - limited ROM lateral rotation to left, also in flexion/extension.  no lhermitte phenomenon.  midline no spinal tenderness.  + spurling with test L.  + L tight lower cervical paraspinous mm.  pain relieved with shoulder abduction test  R shoulder - FROM, no deformity L shoulder - FROM, no pain/weakness with testing of SITS, neg empty can sign.  mild pain with hawkins.  limited motion  with scratching back compared to R side.  no wasting/atrophy of muscles Neurologic:  2+ DTRs x diminished L triceps reflex.  sensation intact, strength intact.  bilaterally equally strong wrist flexors/extensors, elbow extensors/flexors, and hand intrinsics   Impression & Recommendations:  Problem # 1:  NECK PAIN, LEFT (ICD-723.1) presumed shoulder/arm pain coming from neck.  cervical radiculopathy - positive spurling and relief with shoulder abduction.  ? C7 given decreased triceps.  no clinical evidence of weakness today.  rec conservative treatment with scheduled  NSAID/muscle relaxant for 1 wk.  Return in 1-2 wks for f/u, re eval L triceps reflex.  If pain worse, course of steroids.  If not improving with conservative therapy, likely NSG referral for eval.  consider trial of neck collar if no better.  considered cardiac but very musculoskeletal and reproducible on palpation.  Orders: T-Cervical Spine Comp 4 Views [id number])  Her updated medication list for this problem includes:    Flexeril 10 Mg Tabs (Cyclobenzaprine hcl) .Marland Kitchen... Take one by mouth two times a day x 10 days then as needed muscle spasm    Naprosyn 500 Mg Tabs (Naproxen) ..... One by mouth two times a day with food x 10 days then as needed neck pain  Complete Medication List: 1)  Metformin Hcl 500 Mg Tabs (Metformin hcl) .... Take one tablet by mouth two times a day 2)  Januvia 100 Mg Tabs (Sitagliptin phosphate) .... Take one tablet by mouth once daily 3)  Glipizide Xl 2.5 Mg Xr24h-tab (Glipizide) .... Take one tablet by mouth once daily 4)  Lisinopril 10 Mg Tabs (Lisinopril) .... Take one tablet by mouth once daily 5)  Lorazepam 0.5 Mg Tabs (Lorazepam) .Marland Kitchen.. 1 tab by mouth at bedtime as needed insomnia 6)  Freestyle Freedom Lyte Test Strips  .... Check blood sugar once a day 7)  Flexeril 10 Mg Tabs (Cyclobenzaprine hcl) .... Take one by mouth two times a day x 10 days then as needed muscle spasm 8)  Naprosyn 500 Mg Tabs (Naproxen) .... One by mouth two times a day with food x 10 days then as needed neck pain  Patient Instructions: 1)  return in 1-2 wks for follow up. 2)  Xray of neck today. 3)  Treat with naprosyn 500mg  twice daily as well as flexeril twice daily for next week, then as needed. 4)  Stop advil as I have prescribed naprosyn. 5)  Call us if worsening, any numbness/weakness of L hand, or not improving as expected. 6)  If worsening pain, call us for steroid course. 7)  we will call you at [phone] if any change in plan based on radiologist read. Prescriptions: NAPROSYN  500 MG TABS (NAPROXEN) one by mouth two times a day with food x 10 days then as needed neck pain  #30 x 0   Entered and Authorized by:   Eustaquio Boyden  MD   Signed by:   Eustaquio Boyden  MD on 08/06/2010   Method used:   Electronically to        [facility]* (retail)       [address] [place], [place]  [address]       Ph: [phone] or [phone]       Fax: [phone]   RxID:   [id number] FLEXERIL 10 MG TABS (CYCLOBENZAPRINE HCL) take one by mouth two times a day x 10 days then  as needed muscle spasm  #30 x 0   Entered and Authorized by:   Eustaquio Boyden  MD   Signed by:   Eustaquio Boyden  MD on 08/06/2010   Method used:   Electronically to        [facility]* (retail)       [address] [place], [place]  [address]       Ph: [phone] or [phone]       Fax: [phone]   RxID:   [id number]    Orders Added: 1)  T-Cervical Spine Comp 4 Views [72050TC] 2)  Est. Patient Level III [[address]]    Current Allergies (reviewed today): ! NIACIN  Appended Document: PAIN DOWN LEFT SIDE OF BODY X 1 WK/ on my read of c-spine, nothing acute.  await rad read, if abnl, change in plan, will call pt.

## 2010-09-06 ENCOUNTER — Encounter: Payer: Self-pay | Admitting: Family Medicine

## 2010-09-06 ENCOUNTER — Ambulatory Visit (INDEPENDENT_AMBULATORY_CARE_PROVIDER_SITE_OTHER): Payer: BC Managed Care – PPO | Admitting: Family Medicine

## 2010-09-06 DIAGNOSIS — E119 Type 2 diabetes mellitus without complications: Secondary | ICD-10-CM

## 2010-09-06 DIAGNOSIS — E875 Hyperkalemia: Secondary | ICD-10-CM

## 2010-09-09 ENCOUNTER — Ambulatory Visit: Payer: Self-pay | Admitting: Family Medicine

## 2010-09-09 LAB — CONVERTED CEMR LAB
Glucose, Bld: 119 mg/dL — ABNORMAL HIGH (ref 70–99)
Potassium: 4.7 meq/L (ref 3.5–5.3)
Sodium: 142 meq/L (ref 135–145)

## 2010-09-11 NOTE — Assessment & Plan Note (Signed)
Summary: diabetes follow up   Vital Signs:  Patient profile:   65 year old female Height:      64 inches Weight:      139.50 pounds BMI:     24.03 Temp:     98.4 degrees F oral Pulse rate:   71 / minute Pulse rhythm:   regular BP sitting:   138 / 80  (left arm) Cuff size:   regular  Vitals Entered By: Linde Gillis CMA Duncan Dull) (September 06, 2010 2:53 PM) CC: diabetes follow up   History of Present Illness: 65 yo here for follow up DM, HLD. DM II- diagnosed 10 years ago.  On Metformin 500 mg daily, Januvia 100 mg daily, Glipizide 2.5 mg daily. a1c is much improved, now 6.6 ( was 7.1)  Feels it is much better because she finally retired.  Much less stress in her life.  Potassium was elevated at 5.8.  No CP or SOB.  Not on any potassium sparing diuretics.  Does not eat that many bananas.  Current Medications (verified): 1)  Metformin Hcl 500 Mg Tabs (Metformin Hcl) .... Take One Tablet By Mouth Two Times A Day 2)  Januvia 100 Mg Tabs (Sitagliptin Phosphate) .... Take One Tablet By Mouth Once Daily 3)  Glipizide Xl 2.5 Mg Xr24h-Tab (Glipizide) .... Take One Tablet By Mouth Once Daily 4)  Lisinopril 10 Mg Tabs (Lisinopril) .... Take One Tablet By Mouth Once Daily 5)  Lorazepam 0.5 Mg Tabs (Lorazepam) .Marland Kitchen.. 1 Tab By Mouth At Bedtime As Needed Insomnia 6)  Freestyle Freedom Lyte Test Strips .... Check Blood Sugar Once A Day 7)  Flexeril 10 Mg Tabs (Cyclobenzaprine Hcl) .... Take One By Mouth Two Times A Day X 10 Days Then As Needed Muscle Spasm 8)  Naprosyn 500 Mg Tabs (Naproxen) .... One By Mouth Two Times A Day With Food X 10 Days Then As Needed Neck Pain  Allergies: 1)  ! 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 Never Smoked Drug use-no Regular exercise-yes  Risk Factors: Exercise: yes (07/25/2009)  Risk Factors: Smoking Status: never (07/25/2009)  Review of Systems      See HPI General:  Denies malaise. Eyes:  Denies blurring. CV:  Denies chest pain or discomfort.  Physical Exam  General:  Well-developed,well-nourished,in no acute distress; alert,appropriate and cooperative throughout examination.  Lungs:  Normal respiratory effort, chest expands symmetrically. Lungs are clear to auscultation, no crackles or wheezes. Heart:  Normal rate and regular rhythm. S1 and S2 normal without gallop, murmur, click, rub or other extra sounds. Psych:  Cognition and judgment appear intact. Alert and cooperative with normal attention span and concentration. No apparent delusions, illusions, hallucinations   Impression & Recommendations:  Problem # 1:  DIABETES MELLITUS, TYPE II (ICD-250.00) Assessment Improved Congratulated her on her success! Follow up in 3 months, if continues to improve or stay at goal, will d/c Januvia (too costly). Her updated medication list for this problem includes:    Metformin Hcl 500 Mg Tabs (Metformin hcl) .Marland Kitchen... Take one tablet by mouth two times a day    Januvia 100 Mg Tabs (Sitagliptin phosphate) .Marland Kitchen... Take one  tablet by mouth once daily    Glipizide Xl 2.5 Mg Xr24h-tab (Glipizide) .Marland Kitchen... Take one tablet by mouth once daily    Lisinopril 10 Mg Tabs (Lisinopril) .Marland Kitchen... Take one tablet by mouth once daily  Problem # 2:  HYPERKALEMIA (ICD-276.7) Assessment: New recheck BMET today to verify value, may be lab error. advised no food high in potassium- such as dried fruits, beans, bananas. Orders: T-Basic Metabolic Panel [id number])  Complete Medication List: 1)  Metformin Hcl 500 Mg Tabs (Metformin hcl) .... Take one tablet by mouth  two times a day 2)  Januvia 100 Mg Tabs (Sitagliptin phosphate) .... Take one tablet by mouth once daily 3)  Glipizide Xl 2.5 Mg Xr24h-tab (Glipizide) .... Take one tablet by mouth once daily 4)  Lisinopril 10 Mg Tabs (Lisinopril) .... Take one tablet by mouth once daily 5)  Lorazepam 0.5 Mg Tabs (Lorazepam) .Marland Kitchen.. 1 tab by mouth at bedtime as needed insomnia 6)  Freestyle Freedom Lyte Test Strips  .... Check blood sugar once a day 7)  Flexeril 10 Mg Tabs (Cyclobenzaprine hcl) .... Take one by mouth two times a day x 10 days then as needed muscle spasm 8)  Naprosyn 500 Mg Tabs (Naproxen) .... One by mouth two times a day with food x 10 days then as needed neck pain  Patient Instructions: 1)  come back in for fasting labs in 3 months- a1c, BMET (250.00), and lipid panel (272.4)   Orders Added: 1)  T-Basic Metabolic Panel [[id number]] 2)  Est. Patient Level IV [[address]782]    Current Allergies (reviewed today): ! NIACIN

## 2010-09-11 NOTE — Progress Notes (Signed)
Summary: does she need labs  Phone Note Call from Patient Call back at Home Phone [phone]   Caller: Patient Call For: Ruthe Mannan MD Summary of Call: Patient has an appt tomorrow for diabetes follow up. She is asking if she needed to have labs done before coming in. If so should she reschedule tomorrows appt or could she just do labs when she comes in tomrrow.Please advise.  Initial call taken by: Melody Comas,  September 04, 2010 9:21 AM  Follow-up for Phone Call        either one but it is often easier to reschedule the appt for tomorrow so labs can be discussed at her appt.  if she would like to do it that way, please make a lab appt - a1c (250.00), BMET. Ruthe Mannan MD  September 04, 2010 9:41 AM  F/U appt rescheduled for Friday. Lab appt scheduled for tomorrow  morning.  Follow-up by: Melody Comas,  September 04, 2010 9:55 AM

## 2010-10-10 ENCOUNTER — Ambulatory Visit (INDEPENDENT_AMBULATORY_CARE_PROVIDER_SITE_OTHER): Payer: BC Managed Care – PPO | Admitting: Family Medicine

## 2010-10-10 ENCOUNTER — Telehealth: Payer: Self-pay | Admitting: Family Medicine

## 2010-10-10 ENCOUNTER — Encounter: Payer: Self-pay | Admitting: Family Medicine

## 2010-10-10 DIAGNOSIS — M542 Cervicalgia: Secondary | ICD-10-CM

## 2010-10-15 NOTE — Assessment & Plan Note (Signed)
Summary: arm pain, tingling fingers x 3 weeks/alc   Vital Signs:  Patient profile:   65 year old female Height:      64 inches Weight:      142.50 pounds BMI:     24.55 Temp:     98.7 degrees F oral Pulse rate:   72 / minute Pulse rhythm:   regular BP sitting:   122 / 84  (left arm) Cuff size:   regular  Vitals Entered By: Linde Gillis CMA Duncan Dull) (October 10, 2010 11:43 AM) CC: arm pain, tingling in fingers   History of Present Illness: Here for persistent left shoulder/neck pain.   Saw Dr. Reece Agar on 08/06/2010 for these symptoms. cervical spine film unremarkable except for some minimal degenerative disc disease at C6-C7. Given Naprosyn and Flexeril and had some mild improvement of symptoms.  She is concerned that it's just not going away.  no current jaw pain.  No SOB, sweating. Had had some intermittent left sided CP.   No  palpitations.  Not worsened by exertion or relieved by rest.     No numbness down arms.  No weakness in arms/hands.  Denies recent trauma/injury, although has been packing some heavy crates recently.  + fall 2 years ago, hit back of head, no problems after that.      Current Medications (verified): 1)  Metformin Hcl 500 Mg Tabs (Metformin Hcl) .... Take One Tablet By Mouth Two Times A Day 2)  Januvia 100 Mg Tabs (Sitagliptin Phosphate) .... Take One Tablet By Mouth Once Daily 3)  Glipizide Xl 2.5 Mg Xr24h-Tab (Glipizide) .... Take One Tablet By Mouth Once Daily 4)  Lisinopril 10 Mg Tabs (Lisinopril) .... Take One Tablet By Mouth Once Daily 5)  Lorazepam 0.5 Mg Tabs (Lorazepam) .Marland Kitchen.. 1 Tab By Mouth At Bedtime As Needed Insomnia 6)  Freestyle Freedom Lyte Test Strips .... Check Blood Sugar Once A Day 7)  Flexeril 10 Mg Tabs (Cyclobenzaprine Hcl) .... Take One By Mouth Two Times A Day X 10 Days Then As Needed Muscle Spasm 8)  Naprosyn 500 Mg Tabs (Naproxen) .... One By Mouth Two Times A Day With Food X 10 Days Then As Needed Neck Pain  Allergies: 1)  ! 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 Never Smoked Drug use-no Regular exercise-yes  Risk Factors: Exercise: yes (07/25/2009)  Risk Factors: Smoking Status: never (07/25/2009)  Review of Systems      See HPI General:  Denies malaise. Eyes:  Denies blurring. CV:  Denies chest pain or discomfort. MS:  Denies muscle weakness.  Physical Exam  General:  Well-developed,well-nourished,in no acute distress; alert,appropriate and cooperative throughout examination.  Msk:  neck -   no lhermitte phenomenon.  midline no spinal tenderness.  positive spurling.  + L tight lower cervical paraspinous   R shoulder - FROM, no deformity L shoulder - FROM, no pain/weakness with testing of SITS, neg empty can sign. Neurologic:  2+ DTRs x diminished L triceps reflex.  sensation intact, strength intact.  bilaterally equally strong wrist flexors/extensors, elbow extensors/flexors, and hand intrinsics Psych:  Cognition and judgment appear intact. Alert and cooperative with normal  attention span and concentration. No apparent delusions, illusions, hallucinations   Impression & Recommendations:  Problem # 1:  NECK PAIN, LEFT (ICD-723.1) Assessment Unchanged seems most consistent with cervical radiculopathy. will try short course of prednisone, refer to NSG if no improvement. EKG NSR, refer to cardiology for stress test/further work up.  Although this is unlikely cardiac, she does have risk factors. Her updated medication list for this problem includes:    Flexeril 10 Mg Tabs (Cyclobenzaprine hcl) .Marland Kitchen... Take one by  mouth two times a day x 10 days then as needed muscle spasm    Naprosyn 500 Mg Tabs (Naproxen) ..... One by mouth two times a day with food x 10 days then as needed neck pain  Orders: EKG w/ Interpretation (93000) Cardiology Referral (Cardiology)  Complete Medication List: 1)  Metformin Hcl 500 Mg Tabs (Metformin hcl) .... Take one tablet by mouth two times a day 2)  Januvia 100 Mg Tabs (Sitagliptin phosphate) .... Take one tablet by mouth once daily 3)  Glipizide Xl 2.5 Mg Xr24h-tab (Glipizide) .... Take one tablet by mouth once daily 4)  Lisinopril 10 Mg Tabs (Lisinopril) .... Take one tablet by mouth once daily 5)  Lorazepam 0.5 Mg Tabs (Lorazepam) .Marland Kitchen.. 1 tab by mouth at bedtime as needed insomnia 6)  Freestyle Freedom Lyte Test Strips  .... Check blood sugar once a day 7)  Flexeril 10 Mg Tabs (Cyclobenzaprine hcl) .... Take one by mouth two times a day x 10 days then as needed muscle spasm 8)  Naprosyn 500 Mg Tabs (Naproxen) .... One by mouth two times a day with food x 10 days then as needed neck pain 9)  Prednisone 20 Mg Tabs (Prednisone) .... 3 tabs by mouth x 3 days, 2 tabs by mouth x 2 days, 1 tab by mouth x 2 days, 1/2 tab by mouth x 1 day.  dispense qs 10)  Femhrt Low Dose 0.5-2.5 Mg-mcg Tabs (Norethindrone-eth estradiol) .Marland Kitchen.. 1 tab by mouth daily. 11)  Omeprazole 20 Mg Cpdr (Omeprazole) .... One by mouth daily  Patient Instructions: 1)  Please stop by to see Shirlee Limerick on your way out.   Prescriptions: OMEPRAZOLE 20 MG CPDR (OMEPRAZOLE) one by mouth daily  #90 x 3   Entered and Authorized by:   Ruthe Mannan MD   Signed by:   Ruthe Mannan MD on 10/10/2010   Method used:   Electronically to        [facility]* (retail)       [address] [place], [place]  [address]       Ph: [phone] or [phone]       Fax: [phone]   RxID:   [id number] FEMHRT LOW DOSE 0.5-2.5 MG-MCG TABS (NORETHINDRONE-ETH ESTRADIOL) 1  tab by mouth daily.  #30 x 3   Entered and Authorized by:   Ruthe Mannan MD   Signed by:   Ruthe Mannan MD on 10/10/2010   Method used:   Electronically to        [facility]* (retail)       [address] [place], [place]  [address]       Ph: [phone] or [phone]       Fax: [phone]   RxID:   [id number] PREDNISONE 20  MG TABS (PREDNISONE) 3 tabs by mouth x 3 days, 2 tabs by mouth x 2 days, 1 tab by mouth x 2 days, 1/2 tab by mouth x 1 day.  dispense qs  #1 x 0   Entered and Authorized by:   Ruthe Mannan MD   Signed by:   Ruthe Mannan MD on 10/10/2010   Method used:   Electronically to        [facility]* (retail)       [address] [place], [place]  [address]       Ph: [phone] or [phone]       Fax: [phone]   RxID:   [id number]    Orders Added: 1)  EKG w/ Interpretation [93000] 2)  Est. Patient Level IV [[address]] 3)  Cardiology Referral [Cardiology]    Current Allergies (reviewed today): ! NIACIN

## 2010-10-15 NOTE — Progress Notes (Signed)
Summary: doesnt want referral  Phone Note Call from Patient   Caller: Patient Call For: Ruthe Mannan MD Summary of Call: Pt was seen this morning and was to be referred to cardiologist.  She doesnt want this referral, states her husband is in the hospital and she has to watch her money.  States she is going to take the meds given.        Lowella Petties CMA, AAMA  October 10, 2010 2:36 PM   Follow-up for Phone Call        ok I will forward to [facility]. Ruthe Mannan MD  October 10, 2010 2:41 PM

## 2010-10-21 ENCOUNTER — Telehealth: Payer: Self-pay | Admitting: Family Medicine

## 2010-10-21 DIAGNOSIS — R0781 Pleurodynia: Secondary | ICD-10-CM | POA: Insufficient documentation

## 2010-10-21 DIAGNOSIS — R079 Chest pain, unspecified: Secondary | ICD-10-CM | POA: Insufficient documentation

## 2010-10-31 NOTE — Progress Notes (Signed)
Summary: leg cramps, pain under breast  Phone Note Call from Patient Call back at Home Phone [phone] Call back at [phone]   Caller: Patient Call For: Ruthe Mannan MD Summary of Call: Patient wanted to let you know that she has severe leg pain on saturday night. She had trouble with leg cramping for about 12 hrs., then yesterday she was having pain under her breast that lasted all day. She says that she just wanted you to know.  Initial call taken by: Melody Comas,  October 21, 2010 3:57 PM  Follow-up for Phone Call        How is she feeling now?  Does she need to be seen? Ruthe Mannan MD  October 21, 2010 4:02 PM   She says that she is fine now, she is just asking if she should have a stress test done. Please advise.  Melody Comas  October 21, 2010 4:29 PM   Additional Follow-up for Phone Call Additional follow up Details #1::        yes, cardiology referral placed. Ruthe Mannan MD  October 21, 2010 4:31 PM   Patient notified. Additional Follow-up by: Melody Comas,  October 21, 2010 4:35 PM  New Problems: CHEST PAIN UNSPECIFIED (ICD-786.50)   New Problems: CHEST PAIN UNSPECIFIED (ICD-786.50)

## 2010-11-19 ENCOUNTER — Institutional Professional Consult (permissible substitution): Payer: BC Managed Care – PPO | Admitting: Cardiovascular Disease

## 2010-11-23 ENCOUNTER — Encounter: Payer: Self-pay | Admitting: Cardiovascular Disease

## 2010-11-26 ENCOUNTER — Institutional Professional Consult (permissible substitution): Payer: BC Managed Care – PPO | Admitting: Cardiovascular Disease

## 2010-12-18 ENCOUNTER — Other Ambulatory Visit: Payer: Self-pay | Admitting: *Deleted

## 2010-12-18 MED ORDER — SITAGLIPTIN PHOSPHATE 100 MG PO TABS: 100.0000 mg | ORAL_TABLET | Freq: Every day | ORAL | Status: DC

## 2010-12-20 NOTE — Op Note (Signed)
NAMESAMYA, Wanda [name]                            ACCOUNT NO.:  [id number]   MEDICAL RECORD NO.:  [id number]                   PATIENT TYPE:  AMB   LOCATION:  ENDO                                 FACILITY:  [facility]   PHYSICIAN:  Anselmo Rod, M.D.               DATE OF BIRTH:  11-27-1945   DATE OF PROCEDURE:  05/12/2003  DATE OF DISCHARGE:                                 OPERATIVE REPORT   PROCEDURE:  Screening colonoscopy.   ENDOSCOPIST:  Charna Elizabeth, M.D.   INSTRUMENT USED:  Olympus video colonoscope.   INDICATIONS FOR PROCEDURE:  65 year old white female with a personal history  of colonic polyps and guaiac positive stool undergoing colonoscopy to rule  out colonic polyps, masses, etc.   PREPROCEDURE PREPARATION:  Informed consent was obtained from the patient.  The patient was fasted for eight hours prior to the procedure and prepped  with a bottle of magnesium citrate and a gallon of GoLYTELY the night prior  to the procedure.  The patient could not consume the entire gallon because  of nausea associated with the GoLYTELY and only consumed about half the  gallon of GoLYTELY.   PREPROCEDURE PHYSICAL:  Patient with stable vital signs.  Neck supple.  Chest clear to auscultation.  S1 and S2 regular.  Abdomen soft with normal  bowel sounds.   DESCRIPTION OF PROCEDURE:  The patient was placed in the left lateral  decubitus position, sedated with 70 mg of Demerol and 7 mg Versed  intravenously.  Once the patient was adequately sedated, maintained on low  flow oxygen and continuous cardiac monitoring, the Olympus video colonoscope  was advanced into the rectum to the cecum with difficulty, there was a large  amount of stool in the colon, multiple washes were done.  No large polyps or  masses were seen, however, small lesions could have been missed secondary to  a relatively poor prep.  The terminal ileum appeared normal.  The  appendiceal orifice and ileocecal valve were  visualized and photographed.  Small internal hemorrhoids were appreciated on retroflexion of the rectum.   IMPRESSION:  1. Inadequate visualization of the colon secondary to poor prep.  2. Normal appearing terminal ileum.  3. Small internal hemorrhoids.   RECOMMENDATIONS:  1. Repeat guaiacs on an outpatient basis.  2.     Continue high fiber diet with liberal fluid intake.  3. Repeat colonoscopy in the near future.  4. Outpatient follow up in the next two weeks for further recommendations.                                               Anselmo Rod, M.D.    JNM/MEDQ  D:  05/12/2003  T:  05/12/2003  Job:  [id number]  cc:   Lilla Shook, M.D.  [address], [address]  [place]  [place]  [address]  Fax: [phone]   Pershing Cox, M.D.  [address]  [address]  [place]  [place] [address]  Fax: [phone]

## 2010-12-31 ENCOUNTER — Other Ambulatory Visit: Payer: Self-pay | Admitting: *Deleted

## 2010-12-31 MED ORDER — METFORMIN HCL 500 MG PO TABS: 500.0000 mg | ORAL_TABLET | Freq: Two times a day (BID) | ORAL | Status: DC

## 2011-01-02 ENCOUNTER — Ambulatory Visit (INDEPENDENT_AMBULATORY_CARE_PROVIDER_SITE_OTHER): Payer: BC Managed Care – PPO | Admitting: Family Medicine

## 2011-01-02 ENCOUNTER — Other Ambulatory Visit: Payer: Self-pay | Admitting: Family Medicine

## 2011-01-02 ENCOUNTER — Encounter: Payer: Self-pay | Admitting: Family Medicine

## 2011-01-02 VITALS — BP 110/70 | HR 82 | Temp 98.6°F | Ht 64.0 in | Wt 141.0 lb

## 2011-01-02 DIAGNOSIS — E119 Type 2 diabetes mellitus without complications: Secondary | ICD-10-CM

## 2011-01-02 DIAGNOSIS — I10 Essential (primary) hypertension: Secondary | ICD-10-CM

## 2011-01-02 LAB — BASIC METABOLIC PANEL
BUN: 17 mg/dL (ref 6–23)
CO2: 27 mEq/L (ref 19–32)
Calcium: 9.6 mg/dL (ref 8.4–10.5)
Creatinine, Ser: 1 mg/dL (ref 0.4–1.2)

## 2011-01-02 LAB — HEMOGLOBIN A1C: Hgb A1c MFr Bld: 7.2 % — ABNORMAL HIGH (ref 4.6–6.5)

## 2011-01-02 NOTE — Assessment & Plan Note (Signed)
Stable.  Recheck a1c today. 

## 2011-01-02 NOTE — Assessment & Plan Note (Signed)
Stable on Lisinopril 10 mg daily. Recheck BMET today.

## 2011-01-02 NOTE — Progress Notes (Signed)
65 yo here for follow up DM, HLD. DM II- diagnosed 10 years ago.  On Metformin 500 mg daily, Januvia 100 mg daily, Glipizide 2.5 mg daily.  Lab Results  Component Value Date   HGBA1C 6.6* 09/05/2010     Feels it is much better because she finally retired.  Much less stress in her life. Walking more.  Does not check CBGs but denies any symptoms of hypoglycemia.  The PMH, PSH, Social History, Family History, Medications, and allergies have been reviewed in [facility], and have been updated if relevant.   Physical Exam BP 110/70  Pulse 82  Temp(Src) 98.6 F (37 C) (Oral)  Ht 5\' 4"  (1.626 m)  Wt 141 lb (63.957 kg)  BMI 24.20 kg/m2  General:  Well-developed,well-nourished,in no acute distress; alert,appropriate and cooperative throughout examination. Neck:  Supple, no adenopathy Lungs:  Normal respiratory effort, chest expands symmetrically. Lungs are clear to auscultation, no crackles or wheezes. Heart:  Normal rate and regular rhythm. S1 and S2 normal without gallop, murmur, click, rub or other extra sounds. Psych:  Cognition and judgment appear intact. Alert and cooperative with normal attention span and concentration. No apparent delusions, illusions, hallucinations   I

## 2011-02-28 ENCOUNTER — Encounter: Payer: Self-pay | Admitting: *Deleted

## 2011-03-03 ENCOUNTER — Encounter: Payer: Self-pay | Admitting: Family Medicine

## 2011-03-05 ENCOUNTER — Other Ambulatory Visit: Payer: Self-pay | Admitting: *Deleted

## 2011-03-05 MED ORDER — LORAZEPAM 0.5 MG PO TABS: 0.5000 mg | ORAL_TABLET | Freq: Three times a day (TID) | ORAL | Status: DC

## 2011-03-05 NOTE — Telephone Encounter (Signed)
Okay to refill? 

## 2011-03-05 NOTE — Telephone Encounter (Signed)
Rx called in as directed.   

## 2011-03-17 ENCOUNTER — Other Ambulatory Visit: Payer: Self-pay | Admitting: Family Medicine

## 2011-03-17 DIAGNOSIS — I10 Essential (primary) hypertension: Secondary | ICD-10-CM

## 2011-03-17 DIAGNOSIS — E785 Hyperlipidemia, unspecified: Secondary | ICD-10-CM

## 2011-03-17 DIAGNOSIS — E119 Type 2 diabetes mellitus without complications: Secondary | ICD-10-CM

## 2011-03-26 ENCOUNTER — Other Ambulatory Visit (INDEPENDENT_AMBULATORY_CARE_PROVIDER_SITE_OTHER): Payer: BC Managed Care – PPO | Admitting: Family Medicine

## 2011-03-26 DIAGNOSIS — E119 Type 2 diabetes mellitus without complications: Secondary | ICD-10-CM

## 2011-03-26 DIAGNOSIS — I10 Essential (primary) hypertension: Secondary | ICD-10-CM

## 2011-03-26 DIAGNOSIS — E785 Hyperlipidemia, unspecified: Secondary | ICD-10-CM

## 2011-03-26 LAB — BASIC METABOLIC PANEL
Chloride: 111 mEq/L (ref 96–112)
Creatinine, Ser: 0.9 mg/dL (ref 0.4–1.2)

## 2011-03-26 LAB — LIPID PANEL
LDL Cholesterol: 121 mg/dL — ABNORMAL HIGH (ref 0–99)
Total CHOL/HDL Ratio: 5
Triglycerides: 177 mg/dL — ABNORMAL HIGH (ref 0.0–149.0)

## 2011-03-26 LAB — HEMOGLOBIN A1C: Hgb A1c MFr Bld: 6.9 % — ABNORMAL HIGH (ref 4.6–6.5)

## 2011-04-01 ENCOUNTER — Ambulatory Visit (INDEPENDENT_AMBULATORY_CARE_PROVIDER_SITE_OTHER)
Admission: RE | Admit: 2011-04-01 | Discharge: 2011-04-01 | Disposition: A | Discharge status: Home / Self Care | Payer: BC Managed Care – PPO | Source: Ambulatory Visit | Attending: Family Medicine | Admitting: Family Medicine

## 2011-04-01 ENCOUNTER — Encounter: Payer: Self-pay | Admitting: Family Medicine

## 2011-04-01 ENCOUNTER — Ambulatory Visit (INDEPENDENT_AMBULATORY_CARE_PROVIDER_SITE_OTHER): Payer: BC Managed Care – PPO | Admitting: Family Medicine

## 2011-04-01 VITALS — BP 140/80 | HR 96 | Temp 98.7°F | Wt 141.5 lb

## 2011-04-01 DIAGNOSIS — M542 Cervicalgia: Secondary | ICD-10-CM

## 2011-04-01 DIAGNOSIS — M7540 Impingement syndrome of unspecified shoulder: Secondary | ICD-10-CM

## 2011-04-01 MED ORDER — HYDROCODONE-ACETAMINOPHEN 5-500 MG PO TABS: 1.0000 | ORAL_TABLET | Freq: Four times a day (QID) | ORAL | Status: DC | PRN

## 2011-04-01 MED ORDER — CYCLOBENZAPRINE HCL 10 MG PO TABS: 10.0000 mg | ORAL_TABLET | Freq: Three times a day (TID) | ORAL | Status: DC | PRN

## 2011-04-01 NOTE — Patient Instructions (Signed)
  Try to avoid painful activities (overhead activities, lifting with extended arm) as much as possible. vicodin and flexeril as needed for pain.  Please stop by to see Shirlee Limerick on your way out.

## 2011-04-01 NOTE — Progress Notes (Signed)
65 yo here for left neck and shoulder pain x 6 days.  Mainly shoulder, also feels some prickly pain in L hip and in L arm. + L fingers tingle. Took advil 800mg  last night.  Started with L neck pain. Worse with movements. nNo SOB, sweating.   Saw Dr. Reece Agar for same symptoms in 08/2010. treated conservatively with scheduled NSAID/muscle relaxant for 1 wk. Cspine xray with DDD at C6-7 and multifacet arthropathy. Symptoms resolved.  No recent trauma or injury. Did not keep appt for DEXA scan.  Patient Active Problem List  Diagnoses  . Type II or unspecified type diabetes mellitus without mention of complication, not stated as uncontrolled  . HYPERLIPIDEMIA  . HYPERKALEMIA  . HYPERTENSION  . ACUTE BRONCHITIS  . VAGINITIS, ATROPHIC  . FOOT PAIN, BILATERAL  . INSOMNIA  . FATIGUE  . COLONIC POLYPS, HX OF  . NECK PAIN, LEFT  . CHEST PAIN UNSPECIFIED  . Shoulder impingement   Past Medical History  Diagnosis Date  . DM (diabetes mellitus)   . Hyperlipidemia   . HTN (hypertension)   . Colon polyps    Past Surgical History  Procedure Date  . Wrist surgery 1969   History  Substance Use Topics  . Smoking status: Never Smoker   . Smokeless tobacco: Not on file  . Alcohol Use: No   Family History  Problem Relation Age of Onset  . Heart attack     Allergies  Allergen Reactions  . Niacin     REACTION: unbearable hot flashes   Current Outpatient Prescriptions on File Prior to Visit  Medication Sig Dispense Refill  . glipiZIDE (GLUCOTROL) 2.5 MG 24 hr tablet Take 2.5 mg by mouth daily.        Marland Kitchen lisinopril (PRINIVIL,ZESTRIL) 10 MG tablet Take 10 mg by mouth daily.        Marland Kitchen LORazepam (ATIVAN) 0.5 MG tablet Take 1 tablet (0.5 mg total) by mouth every 8 (eight) hours.  90 tablet  0  . metFORMIN (GLUCOPHAGE) 500 MG tablet Take 1 tablet (500 mg total) by mouth 2 (two) times daily with a meal.  60 tablet  6  . omeprazole (PRILOSEC) 20 MG capsule Take 20 mg by mouth daily.        .  sitaGLIPtan (JANUVIA) 100 MG tablet Take 1 tablet (100 mg total) by mouth daily.  30 tablet  6  . norethindrone-ethinyl estradiol (FEMHRT LOW DOSE) 0.5-2.5 MG-MCG per tablet Take 1 tablet by mouth daily.         The PMH, PSH, Social History, Family History, Medications, and allergies have been reviewed in [facility], and have been updated if relevant.  ROS: See HPI  Physical exam: BP 140/80  Pulse 96  Temp(Src) 98.7 F (37.1 C) (Oral)  Wt 141 lb 8 oz (64.184 kg) General: Well-developed,well-nourished,in no acute distress; alert,appropriate and cooperative throughout examination. somewhat stiff  Head: Normocephalic and atraumatic without obvious abnormalities. No apparent alopecia or balding.  Mouth: good dentition. MMM  Neck: No deformities, masses, or tenderness noted. see MSK exam  Chest Wall: + tender to palpation L upper outer chest, reproducible  Lungs: Normal respiratory effort, chest expands symmetrically. Lungs are clear to auscultation, no crackles or wheezes.  Heart: Normal rate and regular rhythm. S1 and S2 normal without gallop, murmur, click, rub or other extra sounds.  Msk: neck - limited ROM lateral rotation to left, also in flexion/extension.  midline no spinal tenderness. + spurling with test L. + L tight lower  cervical paraspinous R shoulder - FROM, no deformity  L shoulder - FROM, no pain/weakness with testing of SITS, neg empty can sign.  Neurologic: 2+ DTRs x diminished L triceps reflex. sensation intact, strength intact. bilaterally equally strong wrist flexors/extensors, elbow extensors/flexors, and hand intrinsics  Assessment and Plan:   1. NECK PAIN, LEFT   Deteriorated.  Likely related to DJD. Try conservative treatment again, refer to ortho for further evaluation and treatment given recurrent nature. DG Cervical Spine Complete, Ambulatory referral to Orthopedic Surgery, cyclobenzaprine (FLEXERIL) 10 MG tablet, HYDROcodone-acetaminophen (VICODIN) 5-500 MG per tablet   2. Shoulder impingement  Ambulatory referral to Orthopedic Surgery

## 2011-04-02 ENCOUNTER — Encounter: Payer: BC Managed Care – PPO | Admitting: Family Medicine

## 2011-04-03 ENCOUNTER — Encounter: Payer: Self-pay | Admitting: Family Medicine

## 2011-04-03 ENCOUNTER — Ambulatory Visit (INDEPENDENT_AMBULATORY_CARE_PROVIDER_SITE_OTHER): Payer: BC Managed Care – PPO | Admitting: Family Medicine

## 2011-04-03 VITALS — BP 120/70 | HR 73 | Temp 98.3°F | Ht 64.0 in | Wt 144.5 lb

## 2011-04-03 DIAGNOSIS — I10 Essential (primary) hypertension: Secondary | ICD-10-CM

## 2011-04-03 DIAGNOSIS — E785 Hyperlipidemia, unspecified: Secondary | ICD-10-CM

## 2011-04-03 DIAGNOSIS — E119 Type 2 diabetes mellitus without complications: Secondary | ICD-10-CM

## 2011-04-03 DIAGNOSIS — M542 Cervicalgia: Secondary | ICD-10-CM

## 2011-04-03 DIAGNOSIS — Z0000 Encounter for general adult medical examination without abnormal findings: Secondary | ICD-10-CM

## 2011-04-03 NOTE — Patient Instructions (Signed)
Try restarting your Fish oil. Check with your insurance to see if they will cover the shingles shot.

## 2011-04-03 NOTE — Progress Notes (Signed)
65 yo here for CPX, pap.    DM II- diagnosed 10 years ago.  On Metformin 1500 mg daily, Januvia 100 mg daily, Glipizide 2.5 mg daily. Lab Results  Component Value Date   HGBA1C 6.9* 03/26/2011    HTN- at goal for diabetic. On Lisinopril 10 mg daily. No CP, SOB, blurred vision or headaches.  HLD-  Not at goal for a diabetic. Lab Results  Component Value Date   CHOL 194 03/26/2011   CHOL 177 05/31/2010   CHOL 169 02/21/2010   Lab Results  Component Value Date   HDL 37.60* 03/26/2011   HDL 30.80* 05/31/2010   HDL 32.40* 02/21/2010   Lab Results  Component Value Date   LDLCALC 121* 03/26/2011   LDLCALC 118* 05/31/2010   LDLCALC 97 02/21/2010   Lab Results  Component Value Date   TRIG 177.0* 03/26/2011   TRIG 140.0 05/31/2010   TRIG 197.0* 02/21/2010    Well woman- no h/o abnormal pap smears.  Mammogram normal this month. Due for zostavax.   Review of Systems       See HPI Patient reports no  vision/ hearing changes,anorexia, weight change, fever ,adenopathy, persistant / recurrent hoarseness, swallowing issues, chest pain, edema,persistant / recurrent cough, hemoptysis, dyspnea(rest, exertional, paroxysmal nocturnal), gastrointestinal  bleeding (melena, rectal bleeding), abdominal pain, excessive heart burn, GU symptoms(dysuria, hematuria, pyuria, voiding/incontinence  Issues) syncope, focal weakness, severe memory loss, concerning skin lesions, depression, anxiety, abnormal bruising/bleeding, major joint swelling, breast masses or abnormal vaginal bleeding.    Heme:  Denies abnormal bruising and bleeding.  Physical Exam BP 120/70  Pulse 73  Temp(Src) 98.3 F (36.8 C) (Oral)  Ht 5\' 4"  (1.626 m)  Wt 144 lb 8 oz (65.545 kg)  BMI 24.80 kg/m2  General:  Well-developed,well-nourished,in no acute distress; alert,appropriate and cooperative throughout examination Head:  Normocephalic and atraumatic without obvious abnormalities. No apparent alopecia or balding. Eyes:  vision  grossly intact, pupils equal, pupils round, and pupils reactive to light.   Ears:  R ear normal and L ear normal.   Nose:  no external deformity.   Mouth:  good dentition.   Neck:  No deformities, masses, or tenderness noted. Chest Wall:  No deformities, masses, or tenderness noted. Breasts:  No mass, nodules, thickening, tenderness, bulging, retraction, inflamation, nipple discharge or skin changes noted.   Lungs:  Normal respiratory effort, chest expands symmetrically. Lungs are clear to auscultation, no crackles or wheezes. Heart:  Normal rate and regular rhythm. S1 and S2 normal without gallop, murmur, click, rub or other extra sounds. Abdomen:  Bowel sounds positive,abdomen soft and non-tender without masses, organomegaly or hernias noted. Msk:  No deformity or scoliosis noted of thoracic or lumbar spine.   Feet- callusnormal ROM, no joint tenderness, and no joint swelling.   Extremities:  no edema Neurologic:  alert & oriented X3 and gait normal.   Skin:  Intact without suspicious lesions or rashes Psych:  Cognition and judgment appear intact. Alert and cooperative with normal attention span and concentration. No apparent delusions, illusions, hallucinations  Diabetes Management Exam:     Foot Exam (with socks and/or shoes not present):       Sensory-Pinprick/Light touch:          Left medial foot (L-4): normal          Left dorsal foot (L-5): normal          Left lateral foot (S-1): normal          Right medial  foot (L-4): normal          Right dorsal foot (L-5): normal          Right lateral foot (S-1): normal       Sensory-Monofilament:          Left foot: normal          Right foot: normal       Inspection:          Left foot: normal          Right foot: normal       Nails:          Left foot: normal          Right foot: normal  Assessment and Plan: 1. HYPERTENSION   Stable.  Continue lisinopril 10 mg daily.  2. HYPERLIPIDEMIA   Deteriorated- not at goal for  diabetic. Will add fish oil as she was intolerant to statins and niacin.  3. Type II or unspecified type diabetes mellitus without mention of complication, not stated as uncontrolled   Improved.  Continue Metformin and Glipizide.  4. Routine general medical examination at a health care facility   Reviewed preventive care protocols, scheduled due services, and updated immunizations Discussed nutrition, exercise, diet, and healthy lifestyle.  IFOB ordered today.  She will call insurance about Zostavax.   5. NECK PAIN, LEFT   Improved.

## 2011-04-08 ENCOUNTER — Telehealth: Payer: Self-pay | Admitting: *Deleted

## 2011-04-08 NOTE — Telephone Encounter (Signed)
Yes can have burning pain (coming from nerve).  When is ortho appt?  rec not exherting left arm.  If worsening pain, to let us know.

## 2011-04-08 NOTE — Telephone Encounter (Signed)
Patient was seen last week with a pinched nerve and now has a burning sensation at times with the pain that comes and goes. Patient wants to know if this is normal with a pinched nerve?

## 2011-04-09 NOTE — Telephone Encounter (Signed)
Message left notifying patient that it is normal to have burning pain with a pinched nerve. Advised not to exert her left arm and to call if pain worsens. Also advised to call and let us know when her ortho appt is scheduled and if she has any questions.

## 2011-04-15 ENCOUNTER — Other Ambulatory Visit: Payer: BC Managed Care – PPO

## 2011-04-16 ENCOUNTER — Encounter: Payer: Self-pay | Admitting: *Deleted

## 2011-04-16 ENCOUNTER — Other Ambulatory Visit: Payer: Self-pay | Admitting: Family Medicine

## 2011-04-16 DIAGNOSIS — Z1211 Encounter for screening for malignant neoplasm of colon: Secondary | ICD-10-CM

## 2011-05-05 ENCOUNTER — Other Ambulatory Visit: Payer: Self-pay | Admitting: *Deleted

## 2011-05-05 DIAGNOSIS — M542 Cervicalgia: Secondary | ICD-10-CM

## 2011-05-05 MED ORDER — CYCLOBENZAPRINE HCL 10 MG PO TABS: 10.0000 mg | ORAL_TABLET | Freq: Three times a day (TID) | ORAL | Status: DC | PRN

## 2011-05-05 MED ORDER — LISINOPRIL 10 MG PO TABS: 10.0000 mg | ORAL_TABLET | Freq: Every day | ORAL | Status: DC

## 2011-05-05 MED ORDER — LORAZEPAM 0.5 MG PO TABS: 0.5000 mg | ORAL_TABLET | Freq: Three times a day (TID) | ORAL | Status: DC

## 2011-05-05 MED ORDER — HYDROCODONE-ACETAMINOPHEN 5-500 MG PO TABS: 1.0000 | ORAL_TABLET | Freq: Four times a day (QID) | ORAL | Status: DC | PRN

## 2011-05-05 MED ORDER — METFORMIN HCL 500 MG PO TABS: 500.0000 mg | ORAL_TABLET | Freq: Two times a day (BID) | ORAL | Status: DC

## 2011-05-05 MED ORDER — SITAGLIPTIN PHOSPHATE 100 MG PO TABS: 100.0000 mg | ORAL_TABLET | Freq: Every day | ORAL | Status: DC

## 2011-05-05 MED ORDER — GLIPIZIDE ER 2.5 MG PO TB24: 2.5000 mg | ORAL_TABLET | Freq: Every day | ORAL | Status: DC

## 2011-05-05 NOTE — Telephone Encounter (Signed)
Ok to send 90 day supply. 

## 2011-05-05 NOTE — Telephone Encounter (Signed)
Patient is requesting a 90 day supply

## 2011-05-05 NOTE — Telephone Encounter (Signed)
Received faxed refill request for patients medications to be sent to [facility].  They were sent to [facility] first by mistake.  They were all resent electronically except for Hydrocodone and Ativan.  They were printed and faxed to [facility].

## 2011-06-10 ENCOUNTER — Telehealth: Payer: Self-pay | Admitting: *Deleted

## 2011-06-10 NOTE — Telephone Encounter (Signed)
There have been questions about it causing increased risk of pancreatitis (inflammation of the pancreas) and pancreatic cancer but studies have been overall inconclusive. For most patients who are at low risk for cancer and sugars are under good control on Januvia, I have not been concerned. If she in uncomfortable, we can switch to something else.

## 2011-06-10 NOTE — Telephone Encounter (Signed)
Patient advised as instructed via telephone.  She will keep this in mind and discuss with you further at her next appt.

## 2011-06-10 NOTE — Telephone Encounter (Signed)
Patient stated that she has been hearing things on the tv about patients on Januvia has an increased risk of pancreatic cancer.  She would like to know your thoughts and opinion about this.  Patient stated that we can leave a detailed message on her machine at home if she doesn't answer.

## 2011-07-21 ENCOUNTER — Telehealth: Payer: Self-pay | Admitting: *Deleted

## 2011-07-21 DIAGNOSIS — E785 Hyperlipidemia, unspecified: Secondary | ICD-10-CM

## 2011-07-21 DIAGNOSIS — E119 Type 2 diabetes mellitus without complications: Secondary | ICD-10-CM

## 2011-07-21 NOTE — Telephone Encounter (Signed)
Orders entered

## 2011-07-21 NOTE — Telephone Encounter (Signed)
Request to have labs done prior to 01.09.13 OV.

## 2011-07-22 NOTE — Telephone Encounter (Signed)
Patient advised as instructed via telephone.  She will call back to schedule lab appt. 

## 2011-08-07 ENCOUNTER — Other Ambulatory Visit (INDEPENDENT_AMBULATORY_CARE_PROVIDER_SITE_OTHER): Payer: BC Managed Care – PPO

## 2011-08-07 DIAGNOSIS — E785 Hyperlipidemia, unspecified: Secondary | ICD-10-CM

## 2011-08-07 DIAGNOSIS — E119 Type 2 diabetes mellitus without complications: Secondary | ICD-10-CM

## 2011-08-07 LAB — LIPID PANEL
HDL: 37.1 mg/dL — ABNORMAL LOW (ref >39.00)
Total CHOL/HDL Ratio: 5
Triglycerides: 180 mg/dL — ABNORMAL HIGH (ref 0.0–149.0)
VLDL: 36 mg/dL (ref 0.0–40.0)

## 2011-08-07 LAB — BASIC METABOLIC PANEL
CO2: 26 mEq/L (ref 19–32)
Glucose, Bld: 81 mg/dL (ref 70–99)
Potassium: 5.4 mEq/L — ABNORMAL HIGH (ref 3.5–5.1)
Sodium: 145 mEq/L (ref 135–145)

## 2011-08-07 LAB — HEMOGLOBIN A1C: Hgb A1c MFr Bld: 6.6 % — ABNORMAL HIGH (ref 4.6–6.5)

## 2011-08-13 ENCOUNTER — Encounter: Payer: Self-pay | Admitting: Family Medicine

## 2011-08-13 ENCOUNTER — Ambulatory Visit (INDEPENDENT_AMBULATORY_CARE_PROVIDER_SITE_OTHER): Payer: BC Managed Care – PPO | Admitting: Family Medicine

## 2011-08-13 VITALS — BP 120/70 | HR 68 | Temp 97.8°F | Wt 144.5 lb

## 2011-08-13 DIAGNOSIS — E119 Type 2 diabetes mellitus without complications: Secondary | ICD-10-CM

## 2011-08-13 NOTE — Patient Instructions (Signed)
Great to see you. Your a1c was 6.6!!  Great work.  Please schedule an appointment for a physical in 3 months.

## 2011-08-13 NOTE — Progress Notes (Signed)
66 yo here for follow up DM, HLD. DM II- diagnosed 10 years ago.  On Metformin 500 mg daily, Januvia 100 mg daily, Glipizide 2.5 mg daily.  Lab Results  Component Value Date   HGBA1C 6.6* 08/07/2011     Walking more.  Does not check CBGs but denies any symptoms of hypoglycemia.  Wt Readings from Last 3 Encounters:  08/13/11 144 lb 8 oz (65.545 kg)  04/03/11 144 lb 8 oz (65.545 kg)  04/01/11 141 lb 8 oz (64.184 kg)   HLD- really working diet.  Lab Results  Component Value Date   CHOL 178 08/07/2011   HDL 37.10* 08/07/2011   LDLCALC 105* 08/07/2011   TRIG 180.0* 08/07/2011   CHOLHDL 5 08/07/2011     The PMH, PSH, Social History, Family History, Medications, and allergies have been reviewed in [facility], and have been updated if relevant.   Physical Exam BP 120/70  Pulse 68  Temp(Src) 97.8 F (36.6 C) (Oral)  Wt 144 lb 8 oz (65.545 kg)  General:  Well-developed,well-nourished,in no acute distress; alert,appropriate and cooperative throughout examination. Neck:  Supple, no adenopathy Lungs:  Normal respiratory effort, chest expands symmetrically. Lungs are clear to auscultation, no crackles or wheezes. Heart:  Normal rate and regular rhythm. S1 and S2 normal without gallop, murmur, click, rub or other extra sounds. Psych:  Cognition and judgment appear intact. Alert and cooperative with normal attention span and concentration. No apparent delusions, illusions, hallucinations   Assessment and Plan: 1. Type II or unspecified type diabetes mellitus without mention of complication, not stated as uncontrolled    Improved.  Continue current meds.  Follow up in 3 months.

## 2011-08-22 ENCOUNTER — Other Ambulatory Visit: Payer: Self-pay | Admitting: *Deleted

## 2011-08-22 MED ORDER — GLIPIZIDE ER 2.5 MG PO TB24: 2.5000 mg | ORAL_TABLET | Freq: Every day | ORAL | Status: DC

## 2011-08-22 MED ORDER — LISINOPRIL 10 MG PO TABS: 10.0000 mg | ORAL_TABLET | Freq: Every day | ORAL | Status: DC

## 2011-11-03 ENCOUNTER — Other Ambulatory Visit: Payer: Self-pay | Admitting: Family Medicine

## 2011-11-03 DIAGNOSIS — I10 Essential (primary) hypertension: Secondary | ICD-10-CM

## 2011-11-03 DIAGNOSIS — E119 Type 2 diabetes mellitus without complications: Secondary | ICD-10-CM

## 2011-11-03 DIAGNOSIS — E785 Hyperlipidemia, unspecified: Secondary | ICD-10-CM

## 2011-11-07 ENCOUNTER — Other Ambulatory Visit: Payer: BC Managed Care – PPO

## 2011-11-13 ENCOUNTER — Encounter: Payer: BC Managed Care – PPO | Admitting: Family Medicine

## 2011-11-24 ENCOUNTER — Other Ambulatory Visit (INDEPENDENT_AMBULATORY_CARE_PROVIDER_SITE_OTHER): Payer: BC Managed Care – PPO

## 2011-11-24 DIAGNOSIS — I10 Essential (primary) hypertension: Secondary | ICD-10-CM

## 2011-11-24 DIAGNOSIS — E119 Type 2 diabetes mellitus without complications: Secondary | ICD-10-CM

## 2011-11-24 DIAGNOSIS — E785 Hyperlipidemia, unspecified: Secondary | ICD-10-CM

## 2011-11-24 LAB — COMPREHENSIVE METABOLIC PANEL
Alkaline Phosphatase: 85 U/L (ref 39–117)
CO2: 25 mEq/L (ref 19–32)
Creatinine, Ser: 1.1 mg/dL (ref 0.4–1.2)
GFR: 55.46 mL/min — ABNORMAL LOW (ref >60.00)
Glucose, Bld: 140 mg/dL — ABNORMAL HIGH (ref 70–99)
Sodium: 142 mEq/L (ref 135–145)
Total Bilirubin: 0.5 mg/dL (ref 0.3–1.2)
Total Protein: 7.5 g/dL (ref 6.0–8.3)

## 2011-11-24 LAB — LIPID PANEL
Cholesterol: 209 mg/dL — ABNORMAL HIGH (ref 0–200)
HDL: 37.7 mg/dL — ABNORMAL LOW (ref >39.00)
Total CHOL/HDL Ratio: 6
VLDL: 38.2 mg/dL (ref 0.0–40.0)

## 2011-11-25 ENCOUNTER — Other Ambulatory Visit: Payer: Self-pay | Admitting: *Deleted

## 2011-11-25 MED ORDER — SODIUM POLYSTYRENE SULFONATE PO POWD: Freq: Once | ORAL | Status: DC

## 2011-11-27 ENCOUNTER — Encounter: Payer: BC Managed Care – PPO | Admitting: Family Medicine

## 2011-11-27 ENCOUNTER — Ambulatory Visit (INDEPENDENT_AMBULATORY_CARE_PROVIDER_SITE_OTHER): Payer: BC Managed Care – PPO | Admitting: Family Medicine

## 2011-11-27 ENCOUNTER — Encounter: Payer: Self-pay | Admitting: Family Medicine

## 2011-11-27 VITALS — BP 128/70 | HR 68 | Temp 98.1°F | Wt 143.0 lb

## 2011-11-27 DIAGNOSIS — R944 Abnormal results of kidney function studies: Secondary | ICD-10-CM

## 2011-11-27 DIAGNOSIS — E785 Hyperlipidemia, unspecified: Secondary | ICD-10-CM

## 2011-11-27 DIAGNOSIS — E875 Hyperkalemia: Secondary | ICD-10-CM

## 2011-11-27 DIAGNOSIS — I10 Essential (primary) hypertension: Secondary | ICD-10-CM

## 2011-11-27 DIAGNOSIS — R5381 Other malaise: Secondary | ICD-10-CM

## 2011-11-27 DIAGNOSIS — E119 Type 2 diabetes mellitus without complications: Secondary | ICD-10-CM

## 2011-11-27 DIAGNOSIS — R079 Chest pain, unspecified: Secondary | ICD-10-CM

## 2011-11-27 DIAGNOSIS — E1121 Type 2 diabetes mellitus with diabetic nephropathy: Secondary | ICD-10-CM | POA: Insufficient documentation

## 2011-11-27 DIAGNOSIS — R5383 Other fatigue: Secondary | ICD-10-CM

## 2011-11-27 DIAGNOSIS — Z0000 Encounter for general adult medical examination without abnormal findings: Secondary | ICD-10-CM

## 2011-11-27 DIAGNOSIS — F4320 Adjustment disorder, unspecified: Secondary | ICD-10-CM

## 2011-11-27 LAB — BASIC METABOLIC PANEL
BUN: 20 mg/dL (ref 6–23)
CO2: 28 mEq/L (ref 19–32)
Calcium: 9.7 mg/dL (ref 8.4–10.5)
Creatinine, Ser: 1 mg/dL (ref 0.4–1.2)
GFR: 60.01 mL/min (ref >60.00)
Glucose, Bld: 145 mg/dL — ABNORMAL HIGH (ref 70–99)
Sodium: 143 mEq/L (ref 135–145)

## 2011-11-27 LAB — CBC WITH DIFFERENTIAL/PLATELET
Lymphocytes Relative: 25.7 % (ref 12.0–46.0)
Lymphs Abs: 1.5 10*3/uL (ref 0.7–4.0)
MCHC: 32.8 g/dL (ref 30.0–36.0)
Monocytes Absolute: 0.5 10*3/uL (ref 0.1–1.0)
Monocytes Relative: 8.2 % (ref 3.0–12.0)
Neutrophils Relative %: 60.4 % (ref 43.0–77.0)
Platelets: 252 10*3/uL (ref 150.0–400.0)
RDW: 13.8 % (ref 11.5–14.6)

## 2011-11-27 MED ORDER — BUSPIRONE HCL 15 MG PO TABS: 7.5000 mg | ORAL_TABLET | Freq: Two times a day (BID) | ORAL | Status: DC

## 2011-11-27 NOTE — Patient Instructions (Signed)
Good to see you. Please stop by to see Wanda [name] on your way out set up your kidney appointment. We will call you with your lab results.

## 2011-11-27 NOTE — Progress Notes (Signed)
66 yo here for 3 month follow up.  Hyperkalemia- potassium elevated to 5.4 this week. Given rx for kayexelate 15 grams po x 1. GFR decreased to 55.46.  Last year, had elevated potassium and drop in GFR, all resolved once her diabetes was under control. Does endorse some "chest tightness" a couple of days ago, non exertional.  No SOB. Pos FH of CAD.   DM II- diagnosed 10 years ago. Deteriorated.  On Metformin 1500 mg daily, Januvia 100 mg daily, Glipizide 2.5 mg daily. Admits to not taking care of herself because she was worried about her husband who has been having illnesses not yet diagnosed.  Lab Results  Component Value Date   HGBA1C 7.3* 11/24/2011    HTN- at goal for diabetic. On Lisinopril 10 mg daily. No CP, SOB, blurred vision or headaches.  HLD-   Lab Results  Component Value Date   CHOL 209* 11/24/2011   HDL 37.70* 11/24/2011   LDLCALC 105* 08/07/2011   LDLDIRECT 128.1 11/24/2011   TRIG 191.0* 11/24/2011   CHOLHDL 6 11/24/2011    Anxiety- more anxious and tearful lately. Worried about her husband.  No SI or HI but feels like she is not herself.  Patient Active Problem List  Diagnoses  . Type II or unspecified type diabetes mellitus without mention of complication, not stated as uncontrolled  . HYPERLIPIDEMIA  . HYPERKALEMIA  . HYPERTENSION  . ACUTE BRONCHITIS  . VAGINITIS, ATROPHIC  . FOOT PAIN, BILATERAL  . INSOMNIA  . FATIGUE  . COLONIC POLYPS, HX OF  . NECK PAIN, LEFT  . CHEST PAIN UNSPECIFIED  . Shoulder impingement  . Routine general medical examination at a health care facility  . Decreased GFR  . Adjustment disorder   Past Medical History  Diagnosis Date  . DM (diabetes mellitus)   . Hyperlipidemia   . HTN (hypertension)   . Colon polyps    Past Surgical History  Procedure Date  . Wrist surgery 1969   History  Substance Use Topics  . Smoking status: Never Smoker   . Smokeless tobacco: Not on file  . Alcohol Use: No   Family History    Problem Relation Age of Onset  . Heart attack     Allergies  Allergen Reactions  . Niacin     REACTION: unbearable hot flashes   Current Outpatient Prescriptions on File Prior to Visit  Medication Sig Dispense Refill  . cyclobenzaprine (FLEXERIL) 10 MG tablet Take 1 tablet (10 mg total) by mouth every 8 (eight) hours as needed for muscle spasms.  90 tablet  3  . glipiZIDE (GLUCOTROL XL) 2.5 MG 24 hr tablet Take 1 tablet (2.5 mg total) by mouth daily.  90 tablet  3  . HYDROcodone-acetaminophen (VICODIN) 5-500 MG per tablet Take 1 tablet by mouth every 6 (six) hours as needed for pain.  90 tablet  0  . lisinopril (PRINIVIL,ZESTRIL) 10 MG tablet Take 1 tablet (10 mg total) by mouth daily.  90 tablet  3  . LORazepam (ATIVAN) 0.5 MG tablet Take 1 tablet (0.5 mg total) by mouth every 8 (eight) hours.  90 tablet  0  . metFORMIN (GLUCOPHAGE) 500 MG tablet Take 1 tablet (500 mg total) by mouth 2 (two) times daily with a meal.  180 tablet  3  . omeprazole (PRILOSEC) 20 MG capsule Take 20 mg by mouth daily.        . sodium polystyrene (KAYEXALATE) powder Take by mouth once. Take 15 grams by  mouth once.  15 g  0   The PMH, PSH, Social History, Family History, Medications, and allergies have been reviewed in [facility], and have been updated if relevant.   Review of Systems       See HPI  Physical Exam BP 128/70  Pulse 68  Temp(Src) 98.1 F (36.7 C) (Oral)  Wt 143 lb (64.864 kg)  General:  Well-developed,well-nourished,in no acute distress; alert,appropriate and cooperative throughout examination Head:  Normocephalic and atraumatic without obvious abnormalities. No apparent alopecia or balding. Eyes:  vision grossly intact, pupils equal, pupils round, and pupils reactive to light.   Ears:  R ear normal and L ear normal.   Nose:  no external deformity.   Mouth:  good dentition.   Neck:  No deformities, masses, or tenderness noted. Chest Wall:  No deformities, masses, or tenderness  noted. Breasts:  No mass, nodules, thickening, tenderness, bulging, retraction, inflamation, nipple discharge or skin changes noted.   Lungs:  Normal respiratory effort, chest expands symmetrically. Lungs are clear to auscultation, no crackles or wheezes. Heart:  Normal rate and regular rhythm. S1 and S2 normal without gallop, murmur, click, rub or other extra sounds. Abdomen:  Bowel sounds positive,abdomen soft and non-tender without masses, organomegaly or hernias noted. Msk:  No deformity or scoliosis noted of thoracic or lumbar spine.   Feet- callusnormal ROM, no joint tenderness, and no joint swelling.   Extremities:  no edema Neurologic:  alert & oriented X3 and gait normal.   Skin:  Intact without suspicious lesions or rashes Psych:  Cognition and judgment appear intact. Alert and cooperative with normal attention span and concentration. No apparent delusions, illusions, hallucinations  Diabetes Management Exam:     Foot Exam (with socks and/or shoes not present):       Sensory-Pinprick/Light touch:          Left medial foot (L-4): normal          Left dorsal foot (L-5): normal          Left lateral foot (S-1): normal          Right medial foot (L-4): normal          Right dorsal foot (L-5): normal          Right lateral foot (S-1): normal       Sensory-Monofilament:          Left foot: normal          Right foot: normal       Inspection:          Left foot: normal          Right foot: normal       Nails:          Left foot: normal          Right foot: normal  Assessment and Plan:   1. Type II or unspecified type diabetes mellitus without mention of complication, not stated as uncontrolled  Deteriorated.  Samples of Januvia given to her, she is motivated to restart her medication. Follow up a1c in 3 months.   2. HYPERTENSION  Stable on Lisinopril at current dose.   3. HYPERLIPIDEMIA  Deteriorated likely due diet. She is motivated to make changes, no rx currently.   Recheck lipids in 3 months.   4. HYPERKALEMIA  New - likely multifactorial but increased glucose playing a roll.  EKG nsr today. S/p one dose of kayexelate today, will refer to renal for further work up (  also has decreased GFR). Basic Metabolic Panel, Ambulatory referral to Nephrology  5. Decreased GFR  See above. Ambulatory referral to Nephrology  6. Chest pain  New- resolved.  EKG reassuring- NSR. Unlikely cardiac.  Likely due to anxiety.  See below.  Pt to let me know if further symptoms develop. EKG 12-Lead  7. Adjustment disorder  New. Discussed tx options- she would like to try daily med- will start buspar 15 mg twice daily .  Follow up in 1 month.

## 2011-11-28 ENCOUNTER — Telehealth: Payer: Self-pay

## 2011-11-28 MED ORDER — SODIUM POLYSTYRENE SULFONATE PO POWD: Freq: Once | ORAL | Status: AC

## 2011-11-28 NOTE — Telephone Encounter (Signed)
Patient notified as instructed by telephone with lab results 11/27/11. Please see lab result note. Kayexalate powder #15 gm x 0 sent to [facility] as instructed.Pt notified while on phone.

## 2011-11-28 NOTE — Progress Notes (Signed)
Opened in error

## 2011-12-01 ENCOUNTER — Other Ambulatory Visit (INDEPENDENT_AMBULATORY_CARE_PROVIDER_SITE_OTHER): Payer: BC Managed Care – PPO

## 2011-12-01 DIAGNOSIS — E875 Hyperkalemia: Secondary | ICD-10-CM

## 2011-12-01 LAB — BASIC METABOLIC PANEL
BUN: 15 mg/dL (ref 6–23)
Calcium: 9.4 mg/dL (ref 8.4–10.5)
GFR: 62.18 mL/min (ref >60.00)
Glucose, Bld: 107 mg/dL — ABNORMAL HIGH (ref 70–99)

## 2012-01-02 ENCOUNTER — Telehealth: Payer: Self-pay | Admitting: Family Medicine

## 2012-01-02 NOTE — Telephone Encounter (Signed)
Caller: Wanda [name]/Patient; Phone Number: [phone]; Message from caller: Returning call to [facility] regarding prescription for diabetic test strips.

## 2012-01-02 NOTE — Telephone Encounter (Signed)
Patient states that she needs a prescription for diabetic test strips so her insurance will cover. Please send prescription to [facility] pharmacy.

## 2012-01-02 NOTE — Telephone Encounter (Signed)
Left message on pt's home number for her to call back.  Cell number she left isn't a working number.

## 2012-01-05 NOTE — Telephone Encounter (Signed)
Left message asking pt to call back. 

## 2012-01-08 MED ORDER — GLUCOSE BLOOD VI STRP: ORAL_STRIP | Status: AC

## 2012-01-08 NOTE — Telephone Encounter (Signed)
Spoke with patient, she uses Freestyle Lite strips.

## 2012-02-23 ENCOUNTER — Telehealth: Payer: Self-pay | Admitting: Family Medicine

## 2012-02-23 ENCOUNTER — Other Ambulatory Visit (INDEPENDENT_AMBULATORY_CARE_PROVIDER_SITE_OTHER): Payer: BC Managed Care – PPO

## 2012-02-23 DIAGNOSIS — E119 Type 2 diabetes mellitus without complications: Secondary | ICD-10-CM

## 2012-02-23 LAB — BASIC METABOLIC PANEL
BUN: 33 mg/dL — ABNORMAL HIGH (ref 6–23)
Chloride: 105 mEq/L (ref 96–112)
Creatinine, Ser: 1.4 mg/dL — ABNORMAL HIGH (ref 0.4–1.2)
GFR: 40.2 mL/min — ABNORMAL LOW (ref >60.00)
Glucose, Bld: 236 mg/dL — ABNORMAL HIGH (ref 70–99)

## 2012-02-23 NOTE — Telephone Encounter (Signed)
Message copied by Excell Seltzer on Mon Feb 23, 2012 12:22 PM ------      Message from: Alvina Chou      Created: Mon Feb 23, 2012 11:07 AM      Regarding: add on lab for Dr Elmer Sow pt.       Patient wanted a bmp added, has had abnormal in the past and it hasn't been checked since April. Thanks, Camelia Eng

## 2012-02-23 NOTE — Addendum Note (Signed)
Addended by: Alvina Chou on: 02/23/2012 02:09 PM   Modules accepted: Orders

## 2012-02-24 ENCOUNTER — Telehealth: Payer: Self-pay | Admitting: *Deleted

## 2012-02-24 NOTE — Telephone Encounter (Signed)
Advised patient of lab results.  She has been taking chlorthalidone for 2-3 months, one 25 mg tablet a day, given to her by her kidney doctor and she is asking if she should continue this, since she has not been drinking much fluid.Marland Kitchen  Per Dr. Dayton Martes, lab report sent to Dr. Thedore Mins for him to evaluate.

## 2012-02-26 ENCOUNTER — Ambulatory Visit (INDEPENDENT_AMBULATORY_CARE_PROVIDER_SITE_OTHER): Payer: BC Managed Care – PPO | Admitting: Family Medicine

## 2012-02-26 VITALS — BP 100/64 | HR 76 | Temp 97.7°F | Wt 140.0 lb

## 2012-02-26 DIAGNOSIS — E119 Type 2 diabetes mellitus without complications: Secondary | ICD-10-CM

## 2012-02-26 DIAGNOSIS — R944 Abnormal results of kidney function studies: Secondary | ICD-10-CM

## 2012-02-26 LAB — COMPREHENSIVE METABOLIC PANEL
Alkaline Phosphatase: 90 U/L (ref 39–117)
Creatinine, Ser: 1.4 mg/dL — ABNORMAL HIGH (ref 0.4–1.2)
Glucose, Bld: 142 mg/dL — ABNORMAL HIGH (ref 70–99)
Sodium: 140 mEq/L (ref 135–145)
Total Bilirubin: 0.5 mg/dL (ref 0.3–1.2)
Total Protein: 7.8 g/dL (ref 6.0–8.3)

## 2012-02-26 MED ORDER — SITAGLIPTIN PHOSPHATE 100 MG PO TABS: 100.0000 mg | ORAL_TABLET | Freq: Every day | ORAL | Status: DC

## 2012-02-26 NOTE — Progress Notes (Signed)
66 yo here for 3 month follow up.   DM II- diagnosed 10 years ago. Deteriorated.  On Metformin 1500 mg daily, Januvia 100 mg daily, Glipizide 2.5 mg daily. Admits to not taking care of herself because she was worried about her husband who has been having illnesses and she does eat more sugar than she should. Does not check CBGs regularly.  Lab Results  Component Value Date   HGBA1C 7.4* 02/23/2012    HTN- at goal for diabetic. On Lisinopril 10 mg daily. No CP, SOB, blurred vision or headaches.  HLD-   Lab Results  Component Value Date   CHOL 209* 11/24/2011   HDL 37.70* 11/24/2011   LDLCALC 105* 08/07/2011   LDLDIRECT 128.1 11/24/2011   TRIG 191.0* 11/24/2011   CHOLHDL 6 11/24/2011    CRI- Renal function declined this month.  Was placed on low dose thiazide diuretic at her last office visit with her nephrologist due to hyperkalemia. Admits to not drinking much water during the day. Labs forwarded to pt's nephrologist, Dr. Thedore Mins. Lab Results  Component Value Date   CREATININE 1.4* 02/23/2012       Patient Active Problem List  Diagnosis  . Type II or unspecified type diabetes mellitus without mention of complication, not stated as uncontrolled  . HYPERLIPIDEMIA  . HYPERKALEMIA  . HYPERTENSION  . ACUTE BRONCHITIS  . VAGINITIS, ATROPHIC  . FOOT PAIN, BILATERAL  . INSOMNIA  . FATIGUE  . COLONIC POLYPS, HX OF  . NECK PAIN, LEFT  . CHEST PAIN UNSPECIFIED  . Shoulder impingement  . Routine general medical examination at a health care facility  . Decreased GFR  . Adjustment disorder   Past Medical History  Diagnosis Date  . DM (diabetes mellitus)   . Hyperlipidemia   . HTN (hypertension)   . Colon polyps    Past Surgical History  Procedure Date  . Wrist surgery 1969   History  Substance Use Topics  . Smoking status: Never Smoker   . Smokeless tobacco: Not on file  . Alcohol Use: No   Family History  Problem Relation Age of Onset  . Heart attack      Allergies  Allergen Reactions  . Niacin     REACTION: unbearable hot flashes   Current Outpatient Prescriptions on File Prior to Visit  Medication Sig Dispense Refill  . busPIRone (BUSPAR) 15 MG tablet Take 0.5 tablets (7.5 mg total) by mouth 2 (two) times daily.  30 tablet  3  . cyclobenzaprine (FLEXERIL) 10 MG tablet Take 1 tablet (10 mg total) by mouth every 8 (eight) hours as needed for muscle spasms.  90 tablet  3  . glipiZIDE (GLUCOTROL XL) 2.5 MG 24 hr tablet Take 1 tablet (2.5 mg total) by mouth daily.  90 tablet  3  . glucose blood (FREESTYLE LITE) test strip Check blood sugar once daily  50 each  11  . HYDROcodone-acetaminophen (VICODIN) 5-500 MG per tablet Take 1 tablet by mouth every 6 (six) hours as needed for pain.  90 tablet  0  . lisinopril (PRINIVIL,ZESTRIL) 10 MG tablet Take 1 tablet (10 mg total) by mouth daily.  90 tablet  3  . LORazepam (ATIVAN) 0.5 MG tablet Take 1 tablet (0.5 mg total) by mouth every 8 (eight) hours.  90 tablet  0  . metFORMIN (GLUCOPHAGE) 500 MG tablet Take 1 tablet (500 mg total) by mouth 2 (two) times daily with a meal.  180 tablet  3  . omeprazole (PRILOSEC)  20 MG capsule Take 20 mg by mouth daily.         The PMH, PSH, Social History, Family History, Medications, and allergies have been reviewed in [facility], and have been updated if relevant.   Review of Systems       See HPI  Physical Exam BP 100/64  Pulse 76  Temp 97.7 F (36.5 C)  Wt 140 lb (63.504 kg)  General:  Well-developed,well-nourished,in no acute distress; alert,appropriate and cooperative throughout examination Head:  Normocephalic and atraumatic without obvious abnormalities. No apparent alopecia or balding. Eyes:  vision grossly intact, pupils equal, pupils round, and pupils reactive to light.   Ears:  R ear normal and L ear normal.   Nose:  no external deformity.   Mouth:  good dentition.   Neck:  No deformities, masses, or tenderness noted. Chest Wall:  No deformities,  masses, or tenderness noted. Breasts:  No mass, nodules, thickening, tenderness, bulging, retraction, inflamation, nipple discharge or skin changes noted.   Lungs:  Normal respiratory effort, chest expands symmetrically. Lungs are clear to auscultation, no crackles or wheezes. Heart:  Normal rate and regular rhythm. S1 and S2 normal without gallop, murmur, click, rub or other extra sounds.   Extremities:  no edema Neurologic:  alert & oriented X3 and gait normal.   Skin:  Intact without suspicious lesions or rashes Psych:  Cognition and judgment appear intact. Alert and cooperative with normal attention span and concentration. No apparent delusions, illusions, hallucinations  Assessment and Plan:   1. Type II or unspecified type diabetes mellitus without mention of complication, not stated as uncontrolled  Deteriorated.   Admits to non compliance with diet. Rather than increasing meds, will refer for diabetic teaching. The patient indicates understanding of these issues and agrees with the plan.    2. HYPERTENSION  Stable on Lisinopril at current dose.   3. Decreased GFR  Advised holding her chlorthalidone. Increase water consumption. Recheck renal function. Labs forwarded to nephrology.

## 2012-02-26 NOTE — Patient Instructions (Addendum)
Please stop by to see Wanda [name] on your way out.   Please hold your diuretic. Keep drinking a lot of water. Since we are holding your diuretic, please increase foods higher in potassium (see list). Follow up labs in 3 months.

## 2012-02-27 ENCOUNTER — Telehealth: Payer: Self-pay | Admitting: Family Medicine

## 2012-02-27 NOTE — Telephone Encounter (Signed)
Called pt to advise her of lab results.

## 2012-03-02 ENCOUNTER — Other Ambulatory Visit: Payer: Self-pay | Admitting: Family Medicine

## 2012-03-02 DIAGNOSIS — E875 Hyperkalemia: Secondary | ICD-10-CM

## 2012-03-03 ENCOUNTER — Encounter: Payer: Self-pay | Admitting: Family Medicine

## 2012-03-04 ENCOUNTER — Encounter: Payer: BC Managed Care – PPO | Attending: Family Medicine | Admitting: *Deleted

## 2012-03-04 ENCOUNTER — Encounter: Payer: Self-pay | Admitting: *Deleted

## 2012-03-04 ENCOUNTER — Other Ambulatory Visit: Payer: BC Managed Care – PPO

## 2012-03-04 ENCOUNTER — Encounter: Payer: Self-pay | Admitting: Family Medicine

## 2012-03-04 ENCOUNTER — Other Ambulatory Visit (INDEPENDENT_AMBULATORY_CARE_PROVIDER_SITE_OTHER): Payer: BC Managed Care – PPO

## 2012-03-04 VITALS — Ht 64.0 in | Wt 142.0 lb

## 2012-03-04 DIAGNOSIS — E875 Hyperkalemia: Secondary | ICD-10-CM

## 2012-03-04 DIAGNOSIS — Z713 Dietary counseling and surveillance: Secondary | ICD-10-CM | POA: Insufficient documentation

## 2012-03-04 DIAGNOSIS — E119 Type 2 diabetes mellitus without complications: Secondary | ICD-10-CM | POA: Insufficient documentation

## 2012-03-04 LAB — BASIC METABOLIC PANEL
BUN: 19 mg/dL (ref 6–23)
GFR: 50.44 mL/min — ABNORMAL LOW (ref >60.00)
Glucose, Bld: 98 mg/dL (ref 70–99)
Potassium: 4.7 mEq/L (ref 3.5–5.1)

## 2012-03-04 NOTE — Patient Instructions (Signed)
Goals:  Follow Diabetes Meal Plan as instructed  Eat 3 meals and 2 snacks, every 3-5 hrs  Limit carbohydrate intake to 30-45 grams carbohydrate/meal  Limit carbohydrate intake to 15 grams carbohydrate/snack  Add lean protein foods to meals/snacks  Monitor glucose levels as instructed by your doctor  Aim for 30 mins of physical activity daily  Bring food record and glucose log to your next nutrition visit 

## 2012-03-04 NOTE — Progress Notes (Signed)
  HgA1C: 7.4%  Patient was seen on 03/04/12 for the first of a series of three diabetes self-management courses at [facility]. The following learning objectives were met by the patient during this course:   Defines the role of glucose and insulin  Identifies type of diabetes and pathophysiology  Defines the diagnostic criteria for diabetes and prediabetes  States the risk factors for Type 2 Diabetes  States the symptoms of Type 2 Diabetes  Defines Type 2 Diabetes treatment goals  Defines Type 2 Diabetes treatment options  States the rationale for glucose monitoring  Identifies A1C, glucose targets, and testing times  Identifies proper sharps disposal  Defines the purpose of a diabetes food plan  Identifies carbohydrate food groups  Defines effects of carbohydrate foods on glucose levels  Identifies carbohydrate choices/grams/food labels  States benefits of physical activity and effect on glucose  Review of suggested activity guidelines  Handouts given during class include:  Type 2 Diabetes: Basics Book  My Food Plan Book  Food and Activity Log   Follow-Up Plan: Attend core 2 and core 3 classes

## 2012-03-05 ENCOUNTER — Telehealth: Payer: Self-pay | Admitting: *Deleted

## 2012-03-05 MED ORDER — METFORMIN HCL ER (MOD) 1000 MG PO TB24: 1000.0000 mg | ORAL_TABLET | Freq: Every day | ORAL | Status: DC

## 2012-03-05 NOTE — Telephone Encounter (Signed)
Rx sent to her pharmacy 

## 2012-03-05 NOTE — Telephone Encounter (Signed)
Pt wants to change from metformin 500 mg's to extended release, for once a day dosing.  She is going to school and forgets to take this in the morning.  Please advise.

## 2012-03-09 ENCOUNTER — Telehealth: Payer: Self-pay

## 2012-03-09 NOTE — Telephone Encounter (Signed)
Triage Record Num: [id number] Operator: Rebeca Allegra Patient Name: Wanda [name] Call Date & Time: 03/08/2012 5:59:26PM Patient Phone: [phone] PCP: Patient Gender: Female PCP Fax : Patient DOB: [birth year] Practice Name: [facility] Reason for Call: Caller: Darria/Patient; PCP: Ruthe Mannan Nestor Ramp); CB#: [phone]; Call regarding Missed a call; from the office 03/05/12. RN looked in EPIC for a note re: call. A note re: a Metformin rx (sent to pharmacy) from 03/05/12 was found. Message relayed to pt. Protocol(s) Used: Office Note Recommended Outcome per Protocol: Information Noted and Sent to Office Reason for Outcome: Caller information to office Care Advice: ~ 08/

## 2012-03-09 NOTE — Telephone Encounter (Signed)
I had called patient to give her lab results, results given.

## 2012-03-25 ENCOUNTER — Ambulatory Visit: Payer: BC Managed Care – PPO

## 2012-04-08 ENCOUNTER — Ambulatory Visit: Payer: BC Managed Care – PPO

## 2012-04-09 ENCOUNTER — Encounter: Payer: Self-pay | Admitting: Family Medicine

## 2012-04-09 ENCOUNTER — Ambulatory Visit (INDEPENDENT_AMBULATORY_CARE_PROVIDER_SITE_OTHER): Payer: BC Managed Care – PPO | Admitting: Family Medicine

## 2012-04-09 VITALS — BP 130/74 | HR 96 | Temp 98.6°F | Wt 142.0 lb

## 2012-04-09 DIAGNOSIS — R05 Cough: Secondary | ICD-10-CM

## 2012-04-09 MED ORDER — ALBUTEROL SULFATE HFA 108 (90 BASE) MCG/ACT IN AERS: 2.0000 | INHALATION_SPRAY | Freq: Four times a day (QID) | RESPIRATORY_TRACT | Status: DC | PRN

## 2012-04-09 MED ORDER — AZITHROMYCIN 250 MG PO TABS: ORAL_TABLET | ORAL | Status: AC

## 2012-04-09 NOTE — Progress Notes (Signed)
Tuesday night she had ST and attributed that to drainage. Since last night, she had more cough, ear pain, rhinorrhea.  No fevers.  She has hot flashes and sweats at baseline.  She can get a deep breath but it triggers a cough.  Leaving town soon. No sick contacts.  Nonsmoker.  Sugar has been controlled.   Meds, vitals, and allergies reviewed.   ROS: See HPI.  Otherwise, noncontributory.  GEN: nad, alert and oriented HEENT: mucous membranes moist, tm w/o erythema, nasal exam w/o erythema, clear discharge noted,  OP with cobblestoning NECK: supple w/o LA CV: rrr.   PULM: ctab, no inc wob EXT: no edema SKIN: no acute rash

## 2012-04-09 NOTE — Patient Instructions (Signed)
Use the inhaler now for the cough and start the antibiotics in a few days if not improved.  Take care.  Get some rest and drink plenty of fluids in the meantime.

## 2012-04-11 DIAGNOSIS — R051 Acute cough: Secondary | ICD-10-CM | POA: Insufficient documentation

## 2012-04-11 DIAGNOSIS — R05 Cough: Secondary | ICD-10-CM | POA: Insufficient documentation

## 2012-04-11 NOTE — Assessment & Plan Note (Signed)
Likely viral, supportive tx in meantime and use SABA prn.  She agrees.  If persisting >1 week, then start abx, she'll hold rx for now.  She agrees.  Nontoxic.

## 2012-05-03 ENCOUNTER — Ambulatory Visit (INDEPENDENT_AMBULATORY_CARE_PROVIDER_SITE_OTHER): Payer: BC Managed Care – PPO | Admitting: Family Medicine

## 2012-05-03 VITALS — BP 140/70 | HR 84 | Temp 98.2°F | Wt 142.0 lb

## 2012-05-03 DIAGNOSIS — M79609 Pain in unspecified limb: Secondary | ICD-10-CM

## 2012-05-03 DIAGNOSIS — M79621 Pain in right upper arm: Secondary | ICD-10-CM

## 2012-05-03 NOTE — Progress Notes (Signed)
Subjective:    Patient ID: Wanda [name], female    DOB: 08-22-1945, 66 y.o.   MRN: [id number]  HPI  Very pleasant 66 yo female with h/o DM, HLD here for 6 months of bilateral axilla heaviness and pain. She feels right >left.  Also feels like right axilla is swollen. Not warm. Non smoker.  Neg mammogram in 02/2012.  No known injury.    Patient Active Problem List  Diagnosis  . Type II or unspecified type diabetes mellitus without mention of complication, not stated as uncontrolled  . HYPERLIPIDEMIA  . HYPERKALEMIA  . HYPERTENSION  . ACUTE BRONCHITIS  . VAGINITIS, ATROPHIC  . FOOT PAIN, BILATERAL  . INSOMNIA  . FATIGUE  . COLONIC POLYPS, HX OF  . NECK PAIN, LEFT  . CHEST PAIN UNSPECIFIED  . Shoulder impingement  . Routine general medical examination at a health care facility  . Decreased GFR  . Adjustment disorder  . Cough   Past Medical History  Diagnosis Date  . DM (diabetes mellitus)   . Hyperlipidemia   . HTN (hypertension)   . Colon polyps    Past Surgical History  Procedure Date  . Wrist surgery 1969   History  Substance Use Topics  . Smoking status: Never Smoker   . Smokeless tobacco: Not on file  . Alcohol Use: No   Family History  Problem Relation Age of Onset  . Heart attack    . Hypertension Mother   . Hyperlipidemia Mother   . Stroke Mother    Allergies  Allergen Reactions  . Niacin     REACTION: unbearable hot flashes   Current Outpatient Prescriptions on File Prior to Visit  Medication Sig Dispense Refill  . albuterol (PROVENTIL HFA;VENTOLIN HFA) 108 (90 BASE) MCG/ACT inhaler Inhale 2 puffs into the lungs every 6 (six) hours as needed (cough).  1 Inhaler  0  . busPIRone (BUSPAR) 15 MG tablet Take 0.5 tablets (7.5 mg total) by mouth 2 (two) times daily.  30 tablet  3  . chlorthalidone (HYGROTON) 25 MG tablet Take 25 mg by mouth daily.      Marland Kitchen glipiZIDE (GLUCOTROL XL) 2.5 MG 24 hr tablet Take 1 tablet (2.5 mg total) by mouth daily.  90  tablet  3  . glucose blood (FREESTYLE LITE) test strip Check blood sugar once daily  50 each  11  . lisinopril (PRINIVIL,ZESTRIL) 10 MG tablet Take 1 tablet (10 mg total) by mouth daily.  90 tablet  3  . metFORMIN (GLUMETZA) 1000 MG (MOD) 24 hr tablet Take 1 tablet (1,000 mg total) by mouth daily with breakfast.  30 tablet  6  . omeprazole (PRILOSEC) 20 MG capsule Take 20 mg by mouth daily.        . sitaGLIPtin (JANUVIA) 100 MG tablet Take 1 tablet (100 mg total) by mouth daily.  90 tablet  3   The PMH, PSH, Social History, Family History, Medications, and allergies have been reviewed in [facility], and have been updated if relevant.   Review of Systems See HPI    Objective:   Physical Exam BP 140/70  Pulse 84  Temp 98.2 F (36.8 C)  Wt 142 lb (64.411 kg)  General:  Well-developed,well-nourished,in no acute distress; alert,appropriate and cooperative throughout examination Head:  normocephalic and atraumatic.   Eyes:  vision grossly intact, pupils equal, pupils round, and pupils reactive to light.   Ears:  R ear normal and L ear normal.   Nose:  no external deformity.  Mouth:  good dentition.   Neck:  No deformities, masses, or tenderness noted. Lungs:  Normal respiratory effort, chest expands symmetrically. Lungs are clear to auscultation, no crackles or wheezes. Heart:  Normal rate and regular rhythm. S1 and S2 normal without gallop, murmur, click, rub or other extra sounds.  Extremities:  No clubbing, cyanosis, edema, or deformity noted with normal full range of motion of all joints.   Neurologic:  alert & oriented X3 and gait normal.   Skin:  Intact without suspicious lesions or rashes Axillary Nodes:  No palpable lymphadenopathy Psych:  Cognition and judgment appear intact. Alert and cooperative with normal attention span and concentration. No apparent delusions, illusions, hallucinations        Assessment & Plan:   1. Pain in right axilla  [place] Upper Ext Art Bilat   New -  having some symptoms in left axilla as well. No palpable abnormality. Given description and duration of symptoms, will order [place] of right axilla with bilateral arterial U/S. The patient indicates understanding of these issues and agrees with the plan.

## 2012-05-03 NOTE — Patient Instructions (Addendum)
Let's get an ultrasound of your right upper arm pit.- they will look at the arteries too. Please stop by to see Shirlee Limerick on your way out.   Please go back to an original deodorant.

## 2012-05-05 ENCOUNTER — Ambulatory Visit
Admission: RE | Admit: 2012-05-05 | Discharge: 2012-05-05 | Disposition: A | Discharge status: Home / Self Care | Payer: BC Managed Care – PPO | Source: Ambulatory Visit | Attending: Family Medicine | Admitting: Family Medicine

## 2012-05-05 DIAGNOSIS — M79621 Pain in right upper arm: Secondary | ICD-10-CM

## 2012-05-26 ENCOUNTER — Other Ambulatory Visit: Payer: Self-pay | Admitting: Family Medicine

## 2012-05-28 ENCOUNTER — Other Ambulatory Visit (INDEPENDENT_AMBULATORY_CARE_PROVIDER_SITE_OTHER): Payer: BC Managed Care – PPO

## 2012-05-28 DIAGNOSIS — E1129 Type 2 diabetes mellitus with other diabetic kidney complication: Secondary | ICD-10-CM

## 2012-05-28 LAB — COMPREHENSIVE METABOLIC PANEL
ALT: 15 U/L (ref 0–35)
CO2: 25 mEq/L (ref 19–32)
Calcium: 9.3 mg/dL (ref 8.4–10.5)
Chloride: 108 mEq/L (ref 96–112)
GFR: 55.37 mL/min — ABNORMAL LOW (ref >60.00)
Potassium: 4.6 mEq/L (ref 3.5–5.1)
Sodium: 140 mEq/L (ref 135–145)
Total Protein: 7.5 g/dL (ref 6.0–8.3)

## 2012-05-28 LAB — HEMOGLOBIN A1C: Hgb A1c MFr Bld: 6.5 % (ref 4.6–6.5)

## 2012-05-29 ENCOUNTER — Other Ambulatory Visit: Payer: Self-pay | Admitting: Family Medicine

## 2012-06-01 ENCOUNTER — Encounter: Payer: BC Managed Care – PPO | Attending: Family Medicine | Admitting: *Deleted

## 2012-06-01 ENCOUNTER — Encounter: Payer: Self-pay | Admitting: *Deleted

## 2012-06-01 DIAGNOSIS — Z713 Dietary counseling and surveillance: Secondary | ICD-10-CM | POA: Insufficient documentation

## 2012-06-01 DIAGNOSIS — E119 Type 2 diabetes mellitus without complications: Secondary | ICD-10-CM | POA: Insufficient documentation

## 2012-06-02 NOTE — Progress Notes (Signed)
  Patient was seen on 06/01/12 for the second of a series of three diabetes self-management courses at the [facility]. The following learning objectives were met by the patient during this course:   Explain basic nutrition maintenance and quality assurance  Describe causes, symptoms and treatment of hypoglycemia and hyperglycemia  Explain how to manage diabetes during illness  Describe the importance of good nutrition for health and healthy eating strategies  List strategies to follow meal plan when dining out  Describe the effects of alcohol on glucose and how to use it safely  Describe problem solving skills for day-to-day glucose challenges  Describe strategies to use when treatment plan needs to change  Identify important factors involved in successful weight loss  Describe ways to remain physically active  Describe the impact of regular activity on insulin resistance   Handouts given in class:  Refrigerator magnet for Sick Day Guidelines  [facility] Oral medication/insulin handout  Follow-Up Plan: Patient will attend the final class of the ADA Diabetes Self-Care Education.   

## 2012-06-02 NOTE — Patient Instructions (Signed)
Goals:  Follow Diabetes Meal Plan as instructed  Eat 3 meals and 2 snacks, every 3-5 hrs  Limit carbohydrate intake to 30-45 grams carbohydrate/meal  Limit carbohydrate intake to 15 grams carbohydrate/snack  Add lean protein foods to meals/snacks  Monitor glucose levels as instructed by your doctor  Aim for 30 mins of physical activity daily  Bring food record and glucose log to your next nutrition visit 

## 2012-06-03 ENCOUNTER — Encounter: Payer: BC Managed Care – PPO | Admitting: *Deleted

## 2012-06-03 ENCOUNTER — Ambulatory Visit (INDEPENDENT_AMBULATORY_CARE_PROVIDER_SITE_OTHER): Payer: BC Managed Care – PPO

## 2012-06-03 DIAGNOSIS — Z23 Encounter for immunization: Secondary | ICD-10-CM

## 2012-06-03 NOTE — Patient Instructions (Signed)
Goals:  Follow Diabetes Meal Plan as instructed  Eat 3 meals and 2 snacks, every 3-5 hrs  Limit carbohydrate intake to 30-45 grams carbohydrate/meal  Limit carbohydrate intake to 15 grams carbohydrate/snack  Add lean protein foods to meals/snacks  Monitor glucose levels as instructed by your doctor  Aim for 30 mins of physical activity daily  Bring food record and glucose log to your next nutrition visit 

## 2012-06-03 NOTE — Progress Notes (Signed)
  Patient was seen on 06/03/12 for the third of a series of three diabetes self-management courses at the [facility]. The following learning objectives were met by the patient during this course:    Describe how diabetes changes over time   Identify diabetes complications and ways to prevent them   Describe strategies that can promote heart health including lowering blood pressure and cholesterol   Describe strategies to lower dietary fat and sodium in the diet   Identify physical activities that benefit cardiovascular health   Evaluate success in meeting personal goal   Describe the belief that they can live successfully with diabetes day to day   Establish 2-3 goals that they will plan to diligently work on until they return for the free 55-month follow-up visit  The following handouts were given in class:  3 Month Follow Up Visit handout  Goal setting handout  Class evaluation form  Your patient has established the following 3 month goals for diabetes self-care:  Be active 150 minutes per week  Test glucose 1 time a day every day  Follow-Up Plan: Patient will attend a 3 month follow-up visit for diabetes self-management education.

## 2012-07-29 ENCOUNTER — Other Ambulatory Visit: Payer: Self-pay | Admitting: Family Medicine

## 2012-09-02 ENCOUNTER — Ambulatory Visit: Payer: BC Managed Care – PPO | Admitting: *Deleted

## 2012-09-15 ENCOUNTER — Other Ambulatory Visit: Payer: Self-pay | Admitting: Family Medicine

## 2012-09-15 ENCOUNTER — Ambulatory Visit: Payer: BC Managed Care – PPO | Admitting: *Deleted

## 2012-10-01 ENCOUNTER — Other Ambulatory Visit: Payer: Self-pay | Admitting: Family Medicine

## 2012-10-01 ENCOUNTER — Other Ambulatory Visit (INDEPENDENT_AMBULATORY_CARE_PROVIDER_SITE_OTHER): Payer: BC Managed Care – PPO

## 2012-10-01 DIAGNOSIS — E1129 Type 2 diabetes mellitus with other diabetic kidney complication: Secondary | ICD-10-CM

## 2012-10-01 DIAGNOSIS — E1059 Type 1 diabetes mellitus with other circulatory complications: Secondary | ICD-10-CM

## 2012-10-01 LAB — COMPREHENSIVE METABOLIC PANEL
AST: 18 U/L (ref 0–37)
Alkaline Phosphatase: 78 U/L (ref 39–117)
BUN: 17 mg/dL (ref 6–23)
Calcium: 9.3 mg/dL (ref 8.4–10.5)
Creatinine, Ser: 1.1 mg/dL (ref 0.4–1.2)

## 2012-10-04 ENCOUNTER — Other Ambulatory Visit (INDEPENDENT_AMBULATORY_CARE_PROVIDER_SITE_OTHER): Payer: BC Managed Care – PPO

## 2012-10-04 DIAGNOSIS — E785 Hyperlipidemia, unspecified: Secondary | ICD-10-CM

## 2012-10-04 LAB — LIPID PANEL
HDL: 31.4 mg/dL — ABNORMAL LOW (ref >39.00)
Triglycerides: 215 mg/dL — ABNORMAL HIGH (ref 0.0–149.0)

## 2012-10-04 LAB — LDL CHOLESTEROL, DIRECT: Direct LDL: 113.8 mg/dL

## 2012-10-08 ENCOUNTER — Ambulatory Visit: Payer: BC Managed Care – PPO | Admitting: Family Medicine

## 2012-10-22 ENCOUNTER — Encounter: Payer: Self-pay | Admitting: Family Medicine

## 2012-10-22 ENCOUNTER — Ambulatory Visit (INDEPENDENT_AMBULATORY_CARE_PROVIDER_SITE_OTHER): Payer: BC Managed Care – PPO | Admitting: Family Medicine

## 2012-10-22 DIAGNOSIS — E1129 Type 2 diabetes mellitus with other diabetic kidney complication: Secondary | ICD-10-CM

## 2012-10-22 DIAGNOSIS — E785 Hyperlipidemia, unspecified: Secondary | ICD-10-CM

## 2012-10-22 DIAGNOSIS — R944 Abnormal results of kidney function studies: Secondary | ICD-10-CM

## 2012-10-22 NOTE — Patient Instructions (Addendum)
Good to see you. Please have labs- a1c, CMET, lipid panel checked in 3 months.

## 2012-10-22 NOTE — Progress Notes (Signed)
67 yo here for 3 month follow up.   DM II- diagnosed 10 years ago. a1c has improved.   On Metformin 1500 mg daily, Januvia 100 mg daily, Glipizide 2.5 mg daily. Admits to not checking CBGs daily.  Checked it last week- was 80 fasting.  Denies any episodes of hypoglycemia.   Lab Results  Component Value Date   HGBA1C 6.8* 10/01/2012    HTN- at goal for diabetic. On Lisinopril 10 mg daily. No CP, SOB, blurred vision or headaches.  HLD-  LDL improved! Lab Results  Component Value Date   CHOL 179 10/04/2012   HDL 31.40* 10/04/2012   LDLCALC 105* 08/07/2011   LDLDIRECT 113.8 10/04/2012   TRIG 215.0* 10/04/2012   CHOLHDL 6 10/04/2012    CRI- Renal function improving.    Lab Results  Component Value Date   CREATININE 1.1 10/01/2012       Patient Active Problem List  Diagnosis  . Diabetes with renal manifestations(250.4)  . HYPERLIPIDEMIA  . HYPERKALEMIA  . HYPERTENSION  . ACUTE BRONCHITIS  . VAGINITIS, ATROPHIC  . FOOT PAIN, BILATERAL  . INSOMNIA  . FATIGUE  . COLONIC POLYPS, HX OF  . NECK PAIN, LEFT  . CHEST PAIN UNSPECIFIED  . Shoulder impingement  . Routine general medical examination at a health care facility  . Decreased GFR  . Adjustment disorder  . Cough   Past Medical History  Diagnosis Date  . DM (diabetes mellitus)   . Hyperlipidemia   . HTN (hypertension)   . Colon polyps    Past Surgical History  Procedure Laterality Date  . Wrist surgery  1969   History  Substance Use Topics  . Smoking status: Never Smoker   . Smokeless tobacco: Not on file  . Alcohol Use: No   Family History  Problem Relation Age of Onset  . Heart attack    . Hypertension Mother   . Hyperlipidemia Mother   . Stroke Mother    Allergies  Allergen Reactions  . Niacin     REACTION: unbearable hot flashes   Current Outpatient Prescriptions on File Prior to Visit  Medication Sig Dispense Refill  . GLIPIZIDE XL 2.5 MG 24 hr tablet TAKE 1 TABLET BY MOUTH ONCE A DAY  90 tablet   1  . glucose blood (FREESTYLE LITE) test strip Check blood sugar once daily  50 each  11  . JANUVIA 100 MG tablet TAKE 1 TABLET BY MOUTH ONCE DAILY.  90 tablet  3  . lisinopril (PRINIVIL,ZESTRIL) 10 MG tablet TAKE 1 TABLET ONCE A DAY  90 tablet  1  . metFORMIN (GLUMETZA) 1000 MG (MOD) 24 hr tablet Take 1 tablet (1,000 mg total) by mouth daily with breakfast.  30 tablet  6  . omeprazole (PRILOSEC) 20 MG capsule Take 20 mg by mouth daily.        . busPIRone (BUSPAR) 15 MG tablet TAKE 1/2 TABLET (7.5 MG TOTAL) BY MOUTH 2 (TWO) TIMES DAILY.  30 tablet  2   No current facility-administered medications on file prior to visit.   The PMH, PSH, Social History, Family History, Medications, and allergies have been reviewed in [facility], and have been updated if relevant.   Review of Systems       See HPI  Physical Exam BP 108/62  Pulse 72  Temp(Src) 98.2 F (36.8 C)  Wt 142 lb (64.411 kg)  BMI 24.36 kg/m2 Wt Readings from Last 3 Encounters:  10/22/12 142 lb (64.411 kg)  05/03/12  142 lb (64.411 kg)  04/09/12 142 lb (64.411 kg)     General:  Well-developed,well-nourished,in no acute distress; alert,appropriate and cooperative throughout examination Head:  Normocephalic and atraumatic without obvious abnormalities. No apparent alopecia or balding. Eyes:  vision grossly intact, pupils equal, pupils round, and pupils reactive to light.   Ears:  R ear normal and L ear normal.   Nose:  no external deformity.   Mouth:  good dentition.   Neck:  No deformities, masses, or tenderness noted. Chest Wall:  No deformities, masses, or tenderness noted. Breasts:  No mass, nodules, thickening, tenderness, bulging, retraction, inflamation, nipple discharge or skin changes noted.   Lungs:  Normal respiratory effort, chest expands symmetrically. Lungs are clear to auscultation, no crackles or wheezes. Heart:  Normal rate and regular rhythm. S1 and S2 normal without gallop, murmur, click, rub or other extra sounds.    Extremities:  no edema Neurologic:  alert & oriented X3 and gait normal.   Skin:  Intact without suspicious lesions or rashes Psych:  Cognition and judgment appear intact. Alert and cooperative with normal attention span and concentration. No apparent delusions, illusions, hallucinations  Assessment and Plan:  1. Diabetes with renal manifestations(250.4) Improved.  Continue current meds.  Follow up in 3 months.  2. HYPERLIPIDEMIA Improved with diet and line dancing! Follow up in 3 months.  3. Decreased GFR Improving.

## 2012-11-01 ENCOUNTER — Other Ambulatory Visit: Payer: Self-pay | Admitting: Family Medicine

## 2012-11-03 ENCOUNTER — Telehealth: Payer: Self-pay | Admitting: *Deleted

## 2012-11-03 MED ORDER — METFORMIN HCL ER 500 MG PO TB24: ORAL_TABLET | ORAL | Status: DC

## 2012-11-03 NOTE — Telephone Encounter (Signed)
Dosage changed in chart, new script called to pharmacy.

## 2012-11-03 NOTE — Telephone Encounter (Signed)
Yes ok to change

## 2012-11-03 NOTE — Telephone Encounter (Signed)
Pharmacy faxed over a request to change metformin er 1000 mg to metformin er 500 mg 2 tabs qd due to cost to patient. Is this ok?

## 2012-11-18 ENCOUNTER — Encounter: Payer: Self-pay | Admitting: Family Medicine

## 2012-12-08 IMAGING — CR DG CERVICAL SPINE COMPLETE 4+V
5 series · 5 of 5 positions shown · non-contrast
Comparison: 08/06/2010.

CLINICAL DATA: History of pain

CERVICAL SPINE - COMPLETE 4+ VIEW

[view not recorded (1 of 5)]
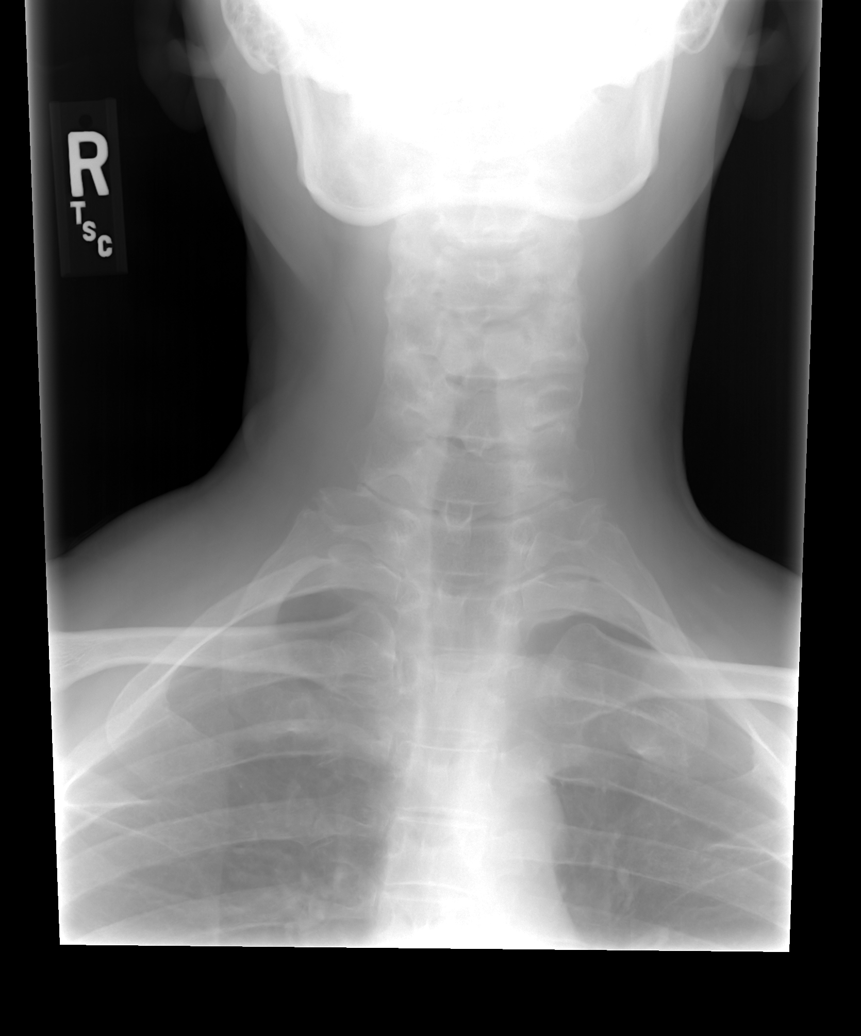

[view not recorded (2 of 5)]
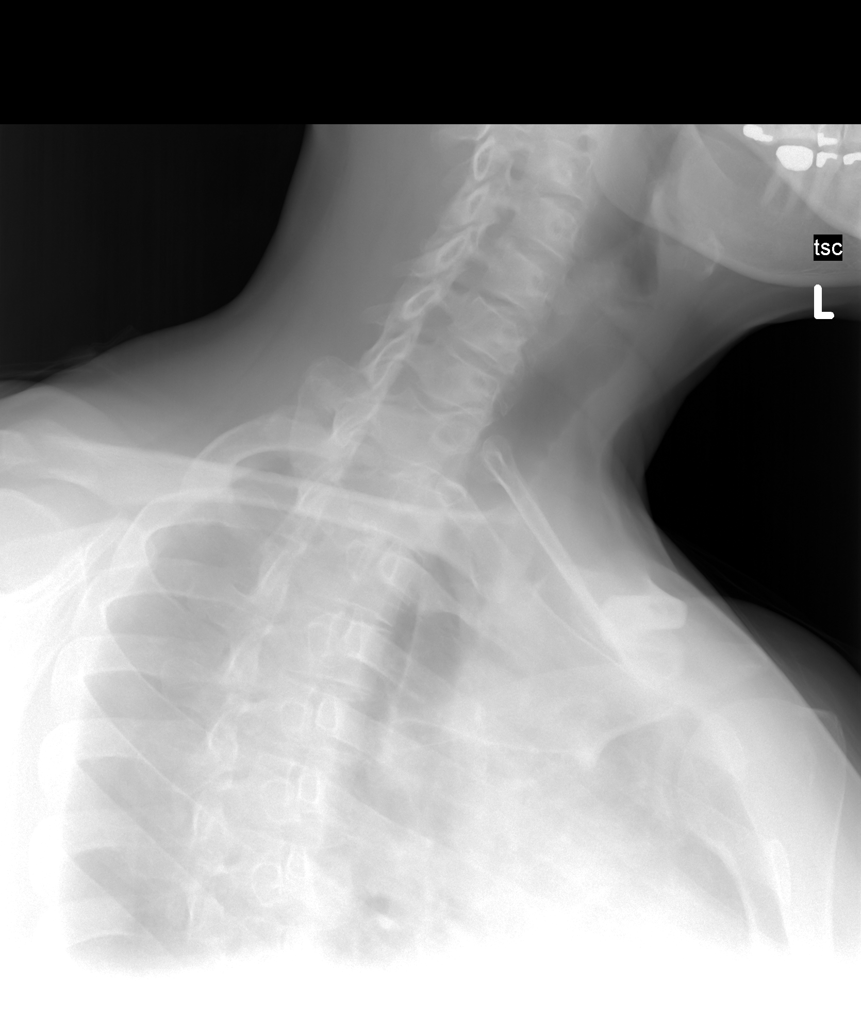

[view not recorded (3 of 5)]
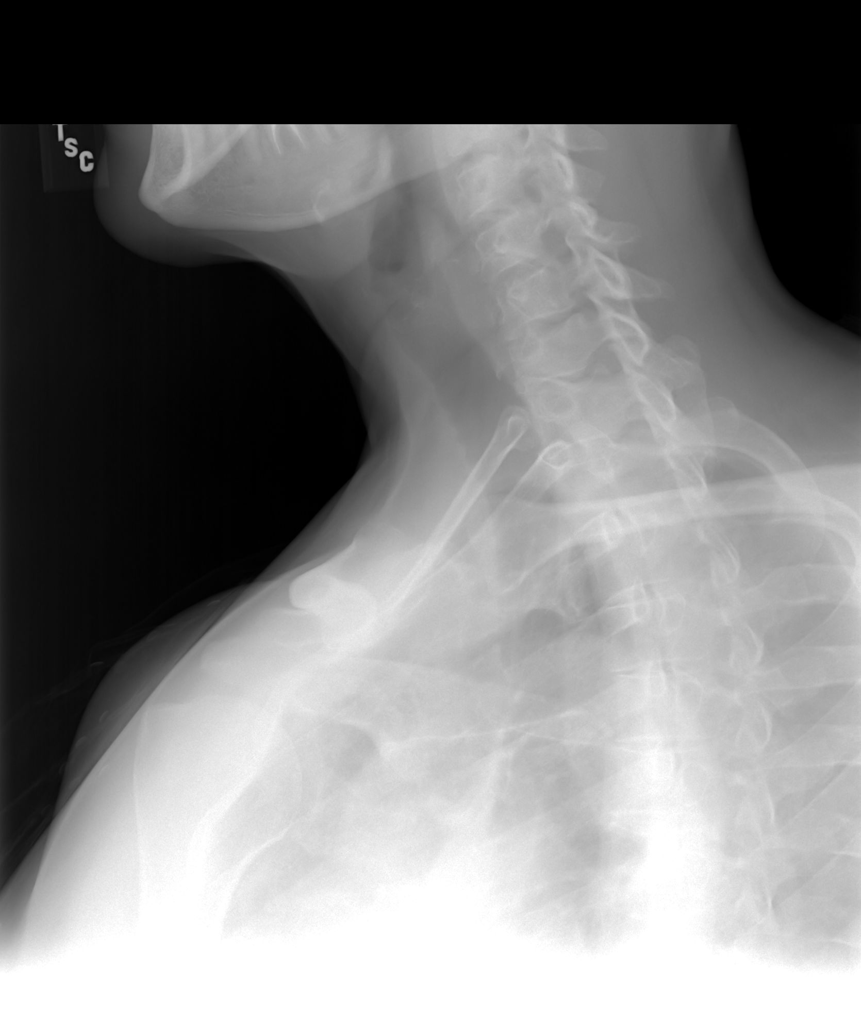

[view not recorded (4 of 5)]
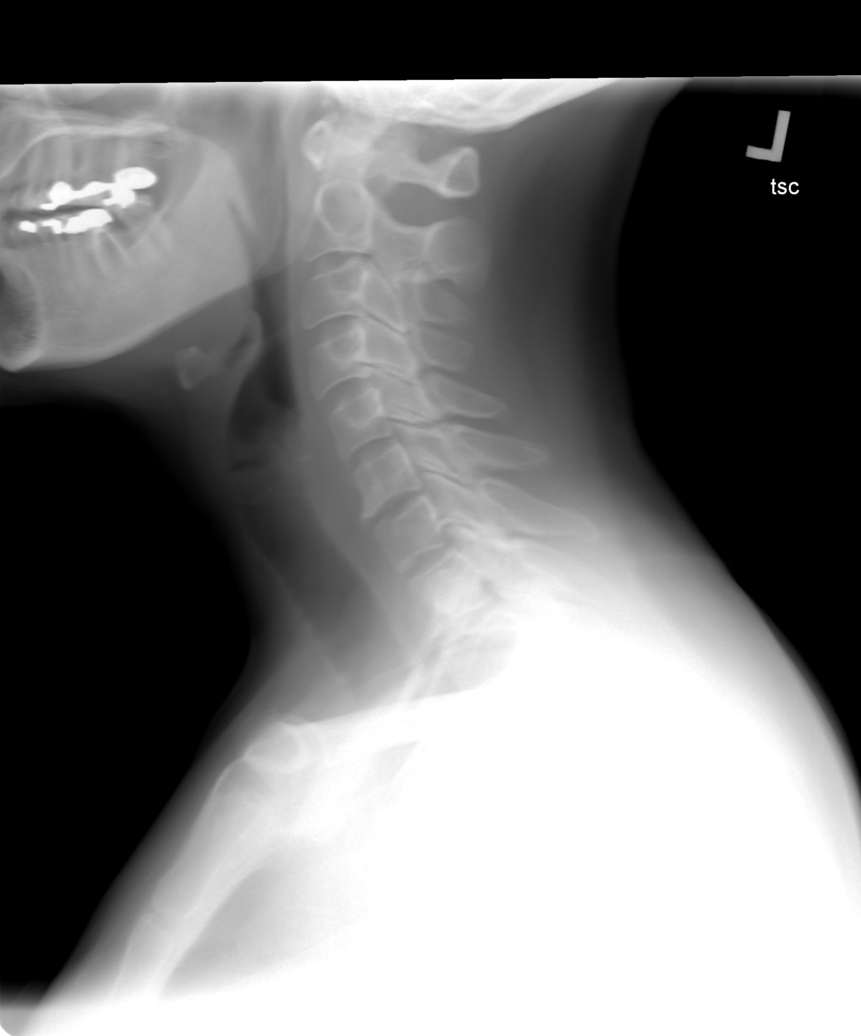

[view not recorded (5 of 5)]
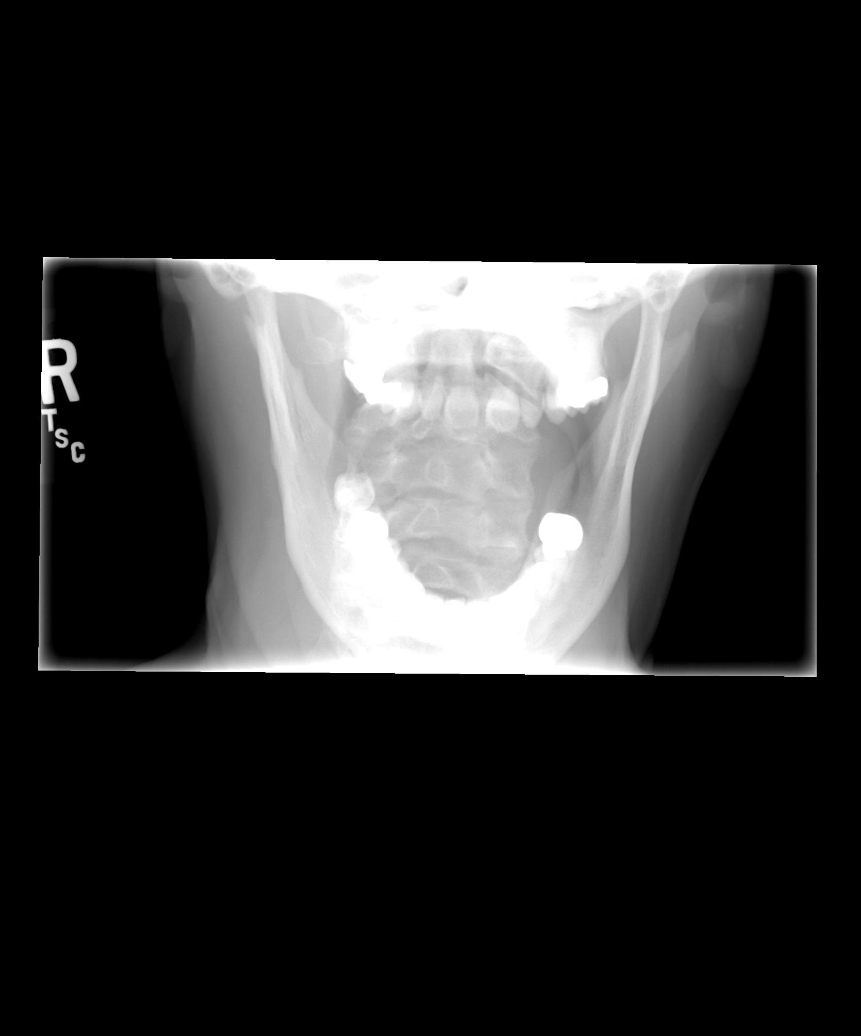

[5 of 5 positions shown; findings below may reference images not displayed]

FINDINGS: No prevertebral soft tissue swelling is evident.
Intervertebral disc spaces are maintained except for slight
narrowing at C6-C7.  There is minimal beginning osteophyte
formation involving the anterior inferior aspect of the C6
vertebral body representing minimal degenerative spondylosis.
Alignment is normal.  No fracture or bony destruction is seen.  The
foramina appear patent.  No cervical ribs are seen.
IMPRESSION: Minimal beginning degenerative spondylosis and degenerative disc
disease changes of C6-C7 level.  This is not significantly changed
from the previous study.  Normal otherwise.

## 2012-12-15 ENCOUNTER — Encounter: Payer: Self-pay | Admitting: Family Medicine

## 2013-01-11 ENCOUNTER — Telehealth: Payer: Self-pay | Admitting: *Deleted

## 2013-01-11 NOTE — Telephone Encounter (Signed)
Pt is coming in next month for visit to clear her for colonoscopy and follow up with diabetes. She's asking if she should have her diabetic labs drawn prior to this visit.  She needs the colonoscopy clearance because when she went to have her colonoscopy she told Dr Loreta Ave that she has been having some chest pain.  She says it been a long while since she has seen a cardiologist.  She requested to wait until 7/22 for her appt here.

## 2013-01-11 NOTE — Telephone Encounter (Signed)
Yes ok to check a1c and bmet prior to visit (250.00)

## 2013-01-12 NOTE — Telephone Encounter (Signed)
Left message asking patient to call back to schedule lab appt.

## 2013-01-26 ENCOUNTER — Other Ambulatory Visit (INDEPENDENT_AMBULATORY_CARE_PROVIDER_SITE_OTHER): Payer: Medicare Other

## 2013-01-26 DIAGNOSIS — E785 Hyperlipidemia, unspecified: Secondary | ICD-10-CM

## 2013-01-26 DIAGNOSIS — E1059 Type 1 diabetes mellitus with other circulatory complications: Secondary | ICD-10-CM

## 2013-01-26 LAB — COMPREHENSIVE METABOLIC PANEL
ALT: 14 U/L (ref 0–35)
AST: 19 U/L (ref 0–37)
Albumin: 3.9 g/dL (ref 3.5–5.2)
Alkaline Phosphatase: 71 U/L (ref 39–117)
Glucose, Bld: 93 mg/dL (ref 70–99)
Potassium: 4.7 mEq/L (ref 3.5–5.1)
Sodium: 142 mEq/L (ref 135–145)
Total Protein: 7.6 g/dL (ref 6.0–8.3)

## 2013-01-26 LAB — LIPID PANEL
LDL Cholesterol: 111 mg/dL — ABNORMAL HIGH (ref 0–99)
VLDL: 33.6 mg/dL (ref 0.0–40.0)

## 2013-01-31 ENCOUNTER — Encounter: Payer: Self-pay | Admitting: Family Medicine

## 2013-01-31 ENCOUNTER — Ambulatory Visit (INDEPENDENT_AMBULATORY_CARE_PROVIDER_SITE_OTHER): Payer: Medicare Other | Admitting: Family Medicine

## 2013-01-31 VITALS — BP 130/72 | HR 72 | Temp 97.5°F | Wt 139.0 lb

## 2013-01-31 DIAGNOSIS — E1129 Type 2 diabetes mellitus with other diabetic kidney complication: Secondary | ICD-10-CM

## 2013-01-31 DIAGNOSIS — Z01810 Encounter for preprocedural cardiovascular examination: Secondary | ICD-10-CM

## 2013-01-31 DIAGNOSIS — R079 Chest pain, unspecified: Secondary | ICD-10-CM

## 2013-01-31 MED ORDER — BUSPIRONE HCL 15 MG PO TABS: ORAL_TABLET | ORAL | Status: DC

## 2013-01-31 NOTE — Progress Notes (Signed)
67 yo here for 3 month follow up and for preo op clearance.  Scheduled to have colonoscopy but told Dr. Loreta Ave she was having intermittent chest pain and was sent here for clearance. She says CP has resolved and thinks it was anxiety.  Was working at a very stressful job at th  Time. No SOB.  DM II- diagnosed 10 years ago. a1c has deteriorated a little.   On Metformin 1500 mg daily, Januvia 100 mg daily, Glipizide 2.5 mg daily. Admits to not checking CBGs daily.  Checked it last week- was 80 fasting.  Denies any episodes of hypoglycemia.\\ She is walking more.   Lab Results  Component Value Date   HGBA1C 7.1* 01/26/2013    HTN- at goal for diabetic. On Lisinopril 10 mg daily. No CP, SOB, blurred vision or headaches.  HLD-  LDL stable and HDL improved!  Lab Results  Component Value Date   CHOL 184 01/26/2013   HDL 39.10 01/26/2013   LDLCALC 111* 01/26/2013   LDLDIRECT 113.8 10/04/2012   TRIG 168.0* 01/26/2013   CHOLHDL 5 01/26/2013    CRI- Renal function stable.  Lab Results  Component Value Date   CREATININE 1.1 01/26/2013       Patient Active Problem List   Diagnosis Date Noted  . Cough 04/11/2012  . Decreased GFR 11/27/2011  . Adjustment disorder 11/27/2011  . Routine general medical examination at a health care facility 04/03/2011  . Shoulder impingement 04/01/2011  . CHEST PAIN UNSPECIFIED 10/21/2010  . NECK PAIN, LEFT 08/06/2010  . ACUTE BRONCHITIS 01/02/2010  . HYPERKALEMIA 11/30/2009  . INSOMNIA 11/21/2009  . FATIGUE 11/21/2009  . Diabetes with renal manifestations(250.4) 07/25/2009  . HYPERLIPIDEMIA 07/25/2009  . HYPERTENSION 07/25/2009  . VAGINITIS, ATROPHIC 07/25/2009  . FOOT PAIN, BILATERAL 07/25/2009  . COLONIC POLYPS, HX OF 07/25/2009   Past Medical History  Diagnosis Date  . DM (diabetes mellitus)   . Hyperlipidemia   . HTN (hypertension)   . Colon polyps    Past Surgical History  Procedure Laterality Date  . Wrist surgery  1969   History   Substance Use Topics  . Smoking status: Never Smoker   . Smokeless tobacco: Not on file  . Alcohol Use: No   Family History  Problem Relation Age of Onset  . Heart attack    . Hypertension Mother   . Hyperlipidemia Mother   . Stroke Mother    Allergies  Allergen Reactions  . Niacin     REACTION: unbearable hot flashes   Current Outpatient Prescriptions on File Prior to Visit  Medication Sig Dispense Refill  . aspirin 81 MG tablet Take 81 mg by mouth daily.      Marland Kitchen GLIPIZIDE XL 2.5 MG 24 hr tablet TAKE 1 TABLET BY MOUTH ONCE A DAY  90 tablet  1  . JANUVIA 100 MG tablet TAKE 1 TABLET BY MOUTH ONCE DAILY.  90 tablet  3  . lisinopril (PRINIVIL,ZESTRIL) 10 MG tablet TAKE 1 TABLET ONCE A DAY  90 tablet  1  . metFORMIN (GLUCOPHAGE-XR) 500 MG 24 hr tablet Take two tablets by mouth once daily with breakfast.  60 tablet  5  . omeprazole (PRILOSEC) 20 MG capsule Take 20 mg by mouth daily.         No current facility-administered medications on file prior to visit.   The PMH, PSH, Social History, Family History, Medications, and allergies have been reviewed in [facility], and have been updated if relevant.   Review  of Systems       See HPI  Physical Exam BP 130/72  Pulse 72  Temp(Src) 97.5 F (36.4 C)  Wt 139 lb (63.05 kg)  BMI 23.85 kg/m2 Wt Readings from Last 3 Encounters:  01/31/13 139 lb (63.05 kg)  10/22/12 142 lb (64.411 kg)  05/03/12 142 lb (64.411 kg)     General:  Well-developed,well-nourished,in no acute distress; alert,appropriate and cooperative throughout examination Head:  Normocephalic and atraumatic without obvious abnormalities. No apparent alopecia or balding. Eyes:  vision grossly intact, pupils equal, pupils round, and pupils reactive to light.   Ears:  R ear normal and L ear normal.   Nose:  no external deformity.   Mouth:  good dentition.   Neck:  No deformities, masses, or tenderness noted. Chest Wall:  No deformities, masses, or tenderness noted. Breasts:   No mass, nodules, thickening, tenderness, bulging, retraction, inflamation, nipple discharge or skin changes noted.   Lungs:  Normal respiratory effort, chest expands symmetrically. Lungs are clear to auscultation, no crackles or wheezes. Heart:  Normal rate and regular rhythm. S1 and S2 normal without gallop, murmur, click, rub or other extra sounds.   Extremities:  no edema Neurologic:  alert & oriented X3 and gait normal.   Skin:  Intact without suspicious lesions or rashes Psych:  Cognition and judgment appear intact. Alert and cooperative with normal attention span and concentration. No apparent delusions, illusions, hallucinations  Assessment and Plan:  1. Diabetes with renal manifestations(250.4) Overall, stable.  Continue current meds.  Follow up in 3 months.  2. HYPERLIPIDEMIA Improved with diet and line dancing! Follow up in 3 months.  3. Decreased GFR Stable.  4.  Pre op clearance- EKG reassuring- NSR.  No prior issues with sedation and symptoms resolved.  She is cleared for colonoscopy.

## 2013-02-22 ENCOUNTER — Ambulatory Visit: Payer: BC Managed Care – PPO | Admitting: Family Medicine

## 2013-02-23 ENCOUNTER — Other Ambulatory Visit: Payer: BC Managed Care – PPO

## 2013-03-09 ENCOUNTER — Other Ambulatory Visit: Payer: Self-pay | Admitting: *Deleted

## 2013-03-09 ENCOUNTER — Other Ambulatory Visit: Payer: Self-pay

## 2013-03-09 MED ORDER — GLUCOSE BLOOD VI STRP: ORAL_STRIP | Status: DC

## 2013-03-17 ENCOUNTER — Encounter: Payer: Self-pay | Admitting: Family Medicine

## 2013-03-24 LAB — HM COLONOSCOPY: HM Colonoscopy: NORMAL

## 2013-04-18 ENCOUNTER — Other Ambulatory Visit: Payer: Self-pay | Admitting: Family Medicine

## 2013-04-19 ENCOUNTER — Other Ambulatory Visit: Payer: Self-pay | Admitting: Family Medicine

## 2013-04-19 ENCOUNTER — Ambulatory Visit (INDEPENDENT_AMBULATORY_CARE_PROVIDER_SITE_OTHER): Payer: Medicare Other | Admitting: *Deleted

## 2013-04-19 DIAGNOSIS — Z111 Encounter for screening for respiratory tuberculosis: Secondary | ICD-10-CM

## 2013-04-20 ENCOUNTER — Other Ambulatory Visit: Payer: Self-pay | Admitting: *Deleted

## 2013-04-20 MED ORDER — GLIPIZIDE ER 2.5 MG PO TB24: ORAL_TABLET | ORAL | Status: DC

## 2013-04-21 ENCOUNTER — Other Ambulatory Visit: Payer: Self-pay | Admitting: Family Medicine

## 2013-04-21 DIAGNOSIS — E785 Hyperlipidemia, unspecified: Secondary | ICD-10-CM

## 2013-04-21 DIAGNOSIS — I10 Essential (primary) hypertension: Secondary | ICD-10-CM

## 2013-04-22 ENCOUNTER — Other Ambulatory Visit (INDEPENDENT_AMBULATORY_CARE_PROVIDER_SITE_OTHER): Payer: Medicare Other

## 2013-04-22 DIAGNOSIS — Z0000 Encounter for general adult medical examination without abnormal findings: Secondary | ICD-10-CM

## 2013-04-22 DIAGNOSIS — I10 Essential (primary) hypertension: Secondary | ICD-10-CM

## 2013-04-22 DIAGNOSIS — E785 Hyperlipidemia, unspecified: Secondary | ICD-10-CM

## 2013-04-22 DIAGNOSIS — R5381 Other malaise: Secondary | ICD-10-CM

## 2013-04-22 DIAGNOSIS — E875 Hyperkalemia: Secondary | ICD-10-CM

## 2013-04-22 DIAGNOSIS — E1129 Type 2 diabetes mellitus with other diabetic kidney complication: Secondary | ICD-10-CM

## 2013-04-22 LAB — COMPREHENSIVE METABOLIC PANEL
AST: 16 U/L (ref 0–37)
Albumin: 3.9 g/dL (ref 3.5–5.2)
Alkaline Phosphatase: 69 U/L (ref 39–117)
BUN: 18 mg/dL (ref 6–23)
Creatinine, Ser: 1.1 mg/dL (ref 0.4–1.2)
Glucose, Bld: 102 mg/dL — ABNORMAL HIGH (ref 70–99)
Potassium: 5.2 mEq/L — ABNORMAL HIGH (ref 3.5–5.1)

## 2013-04-22 LAB — LIPID PANEL
HDL: 33.7 mg/dL — ABNORMAL LOW (ref >39.00)
VLDL: 41.2 mg/dL — ABNORMAL HIGH (ref 0.0–40.0)

## 2013-04-22 LAB — TB SKIN TEST: TB Skin Test: NEGATIVE

## 2013-04-22 LAB — HEMOGLOBIN A1C: Hgb A1c MFr Bld: 6.9 % — ABNORMAL HIGH (ref 4.6–6.5)

## 2013-04-22 LAB — LDL CHOLESTEROL, DIRECT: Direct LDL: 110.3 mg/dL

## 2013-04-25 ENCOUNTER — Ambulatory Visit (INDEPENDENT_AMBULATORY_CARE_PROVIDER_SITE_OTHER): Payer: Medicare Other | Admitting: Family Medicine

## 2013-04-25 ENCOUNTER — Encounter: Payer: Self-pay | Admitting: Family Medicine

## 2013-04-25 VITALS — BP 108/68 | HR 68 | Temp 97.9°F | Ht 64.75 in | Wt 138.8 lb

## 2013-04-25 DIAGNOSIS — E1129 Type 2 diabetes mellitus with other diabetic kidney complication: Secondary | ICD-10-CM

## 2013-04-25 DIAGNOSIS — E785 Hyperlipidemia, unspecified: Secondary | ICD-10-CM

## 2013-04-25 DIAGNOSIS — I10 Essential (primary) hypertension: Secondary | ICD-10-CM

## 2013-04-25 DIAGNOSIS — Z23 Encounter for immunization: Secondary | ICD-10-CM

## 2013-04-25 DIAGNOSIS — E875 Hyperkalemia: Secondary | ICD-10-CM

## 2013-04-25 NOTE — Addendum Note (Signed)
Addended by: Janalyn Harder on: 04/25/2013 08:53 AM   Modules accepted: Orders

## 2013-04-25 NOTE — Progress Notes (Signed)
67 yo pleasant female here for 3 month follow up.   DM II- diagnosed 10 years ago. a1c has improved! On Metformin 1500 mg daily, Januvia 100 mg daily, Glipizide 2.5 mg daily. Admits to not checking CBGs daily.  Checked it yesterday- was 70 fasting.  Denies any episodes of hypoglycemia. Continues to lose weight.   Lab Results  Component Value Date   HGBA1C 6.9* 04/22/2013    HTN- at goal for diabetic. On Lisinopril 10 mg daily. No CP, SOB, blurred vision or headaches.  HLD-  LDL improved! Lab Results  Component Value Date   CHOL 183 04/22/2013   HDL 33.70* 04/22/2013   LDLCALC 111* 01/26/2013   LDLDIRECT 110.3 04/22/2013   TRIG 206.0* 04/22/2013   CHOLHDL 5 04/22/2013    CRI- Cr stable.    Lab Results  Component Value Date   CREATININE 1.1 04/22/2013    Hyperkalemia-  Slightly elevated.  Has been eating more dried fruits.   Patient Active Problem List   Diagnosis Date Noted  . Pre-operative cardiovascular examination 01/31/2013  . Decreased GFR 11/27/2011  . Adjustment disorder 11/27/2011  . Routine general medical examination at a health care facility 04/03/2011  . Shoulder impingement 04/01/2011  . CHEST PAIN UNSPECIFIED 10/21/2010  . NECK PAIN, LEFT 08/06/2010  . HYPERKALEMIA 11/30/2009  . INSOMNIA 11/21/2009  . FATIGUE 11/21/2009  . Diabetes with renal manifestations(250.4) 07/25/2009  . HYPERLIPIDEMIA 07/25/2009  . HYPERTENSION 07/25/2009  . FOOT PAIN, BILATERAL 07/25/2009  . COLONIC POLYPS, HX OF 07/25/2009   Past Medical History  Diagnosis Date  . DM (diabetes mellitus)   . Hyperlipidemia   . HTN (hypertension)   . Colon polyps    Past Surgical History  Procedure Laterality Date  . Wrist surgery  1969   History  Substance Use Topics  . Smoking status: Never Smoker   . Smokeless tobacco: Not on file  . Alcohol Use: No   Family History  Problem Relation Age of Onset  . Heart attack    . Hypertension Mother   . Hyperlipidemia Mother   . Stroke  Mother    Allergies  Allergen Reactions  . Niacin     REACTION: unbearable hot flashes   Current Outpatient Prescriptions on File Prior to Visit  Medication Sig Dispense Refill  . aspirin 81 MG tablet Take 81 mg by mouth daily.      . busPIRone (BUSPAR) 15 MG tablet TAKE 1/2 TABLET (7.5 MG TOTAL) BY MOUTH 2 (TWO) TIMES DAILY.  60 tablet  2  . glipiZIDE (GLUCOTROL XL) 2.5 MG 24 hr tablet TAKE 1 TABLET BY MOUTH ONCE A DAY  90 tablet  1  . glucose blood (FREESTYLE LITE) test strip Use as instructed  50 each  5  . JANUVIA 100 MG tablet TAKE 1 TABLET BY MOUTH ONCE DAILY.  90 tablet  3  . lisinopril (PRINIVIL,ZESTRIL) 10 MG tablet TAKE 1 TABLET ONCE A DAY  90 tablet  1  . metFORMIN (GLUCOPHAGE-XR) 500 MG 24 hr tablet Take two tablets by mouth once daily with breakfast.  60 tablet  5  . omeprazole (PRILOSEC) 20 MG capsule Take 20 mg by mouth daily.         No current facility-administered medications on file prior to visit.   The PMH, PSH, Social History, Family History, Medications, and allergies have been reviewed in [facility], and have been updated if relevant.   Review of Systems       See HPI  Physical  Exam BP 108/68  Pulse 68  Temp(Src) 97.9 F (36.6 C) (Oral)  Ht 5' 4.75" (1.645 m)  Wt 138 lb 12 oz (62.937 kg)  BMI 23.26 kg/m2 Wt Readings from Last 3 Encounters:  04/25/13 138 lb 12 oz (62.937 kg)  01/31/13 139 lb (63.05 kg)  10/22/12 142 lb (64.411 kg)   General:  Well-developed,well-nourished,in no acute distress; alert,appropriate and cooperative throughout examination Head:  Normocephalic and atraumatic without obvious abnormalities. No apparent alopecia or balding. Eyes:  vision grossly intact, pupils equal, pupils round, and pupils reactive to light.   Ears:  R ear normal and L ear normal.   Nose:  no external deformity.   Mouth:  good dentition.   Neck:  No deformities, masses, or tenderness noted. Chest Wall:  No deformities, masses, or tenderness noted. Breasts:  No  mass, nodules, thickening, tenderness, bulging, retraction, inflamation, nipple discharge or skin changes noted.   Lungs:  Normal respiratory effort, chest expands symmetrically. Lungs are clear to auscultation, no crackles or wheezes. Heart:  Normal rate and regular rhythm. S1 and S2 normal without gallop, murmur, click, rub or other extra sounds.   Extremities:  no edema Neurologic:  alert & oriented X3 and gait normal.   Skin:  Intact without suspicious lesions or rashes Psych:  Cognition and judgment appear intact. Alert and cooperative with normal attention span and concentration. No apparent delusions, illusions, hallucinations  Diabetic foot exam: Normal inspection No skin breakdown No calluses  Normal DP pulses Normal sensation to light touch and monofilament Nails normal   Assessment and Plan:  1. HYPERTENSION Stable.  No changes.  2. HYPERLIPIDEMIA Improved.  No changes.  3. HYPERKALEMIA Deteriorated.  Given list of potassium rich foods to cut back on. The patient indicates understanding of these issues and agrees with the plan. Follow up in 3 months.  4. Diabetes with renal manifestations(250.4) Improving. Foot exam and pneumovax today.

## 2013-04-25 NOTE — Patient Instructions (Addendum)
Foods Rich in Potassium Food / Potassium (mg)  Apricots, dried,  cup / 378 mg   Apricots, raw, 1 cup halves / 401 mg   Avocado,  / 487 mg   Banana, 1 large / 487 mg   Beef, lean, round, 3 oz / 202 mg   Cantaloupe, 1 cup cubes / 427 mg   Dates, medjool, 5 whole / 835 mg   Ham, cured, 3 oz / 212 mg   Lentils, dried,  cup / 458 mg   Lima beans, frozen,  cup / 258 mg   Orange, 1 large / 333 mg   Orange juice, 1 cup / 443 mg   Peaches, dried,  cup / 398 mg   Peas, split, cooked,  cup / 355 mg   Potato, boiled, 1 medium / 515 mg   Prunes, dried, uncooked,  cup / 318 mg   Raisins,  cup / 309 mg   Salmon, pink, raw, 3 oz / 275 mg   Sardines, canned , 3 oz / 338 mg   Tomato, raw, 1 medium / 292 mg   Tomato juice, 6 oz / 417 mg   Turkey, 3 oz / 349 mg  Document Released: 07/21/2005 Document Revised: 04/02/2011 Document Reviewed: 12/04/2008 [employer] Patient Information 2012 [employer]. 

## 2013-05-16 ENCOUNTER — Other Ambulatory Visit: Payer: Self-pay | Admitting: Family Medicine

## 2013-06-09 ENCOUNTER — Other Ambulatory Visit: Payer: Self-pay

## 2013-06-16 ENCOUNTER — Other Ambulatory Visit: Payer: Self-pay | Admitting: Family Medicine

## 2013-08-01 ENCOUNTER — Other Ambulatory Visit: Payer: Self-pay | Admitting: Internal Medicine

## 2013-08-01 DIAGNOSIS — E119 Type 2 diabetes mellitus without complications: Secondary | ICD-10-CM

## 2013-08-05 ENCOUNTER — Other Ambulatory Visit (INDEPENDENT_AMBULATORY_CARE_PROVIDER_SITE_OTHER): Payer: Medicare Other

## 2013-08-05 DIAGNOSIS — E119 Type 2 diabetes mellitus without complications: Secondary | ICD-10-CM

## 2013-08-05 LAB — COMPREHENSIVE METABOLIC PANEL
ALT: 16 U/L (ref 0–35)
AST: 18 U/L (ref 0–37)
Albumin: 3.9 g/dL (ref 3.5–5.2)
Alkaline Phosphatase: 71 U/L (ref 39–117)
BUN: 18 mg/dL (ref 6–23)
CO2: 28 mEq/L (ref 19–32)
CREATININE: 1.1 mg/dL (ref 0.4–1.2)
Calcium: 9.2 mg/dL (ref 8.4–10.5)
Chloride: 109 mEq/L (ref 96–112)
GFR: 53.99 mL/min — ABNORMAL LOW (ref >60.00)
GLUCOSE: 83 mg/dL (ref 70–99)
Potassium: 4.7 mEq/L (ref 3.5–5.1)
Sodium: 142 mEq/L (ref 135–145)
Total Bilirubin: 0.6 mg/dL (ref 0.3–1.2)
Total Protein: 7.1 g/dL (ref 6.0–8.3)

## 2013-08-05 LAB — MICROALBUMIN / CREATININE URINE RATIO
CREATININE, U: 195.8 mg/dL
Microalb Creat Ratio: 1 mg/g (ref 0.0–30.0)
Microalb, Ur: 1.9 mg/dL (ref 0.0–1.9)

## 2013-08-05 LAB — HEMOGLOBIN A1C: Hgb A1c MFr Bld: 7.1 % — ABNORMAL HIGH (ref 4.6–6.5)

## 2013-08-10 ENCOUNTER — Encounter: Payer: Self-pay | Admitting: Family Medicine

## 2013-08-10 ENCOUNTER — Ambulatory Visit (INDEPENDENT_AMBULATORY_CARE_PROVIDER_SITE_OTHER): Payer: Medicare Other | Admitting: Family Medicine

## 2013-08-10 DIAGNOSIS — Z23 Encounter for immunization: Secondary | ICD-10-CM

## 2013-08-10 DIAGNOSIS — E1129 Type 2 diabetes mellitus with other diabetic kidney complication: Secondary | ICD-10-CM

## 2013-08-10 DIAGNOSIS — I10 Essential (primary) hypertension: Secondary | ICD-10-CM

## 2013-08-10 DIAGNOSIS — R944 Abnormal results of kidney function studies: Secondary | ICD-10-CM

## 2013-08-10 NOTE — Assessment & Plan Note (Signed)
Deteriorated but still reasonable control. She will work on diet. No changes today. Follow up in 3 months.

## 2013-08-10 NOTE — Progress Notes (Signed)
Pre-visit discussion using our clinic review tool. No additional management support is needed unless otherwise documented below in the visit note.  

## 2013-08-10 NOTE — Assessment & Plan Note (Signed)
Well controlled. On ACEI for renal protection.

## 2013-08-10 NOTE — Assessment & Plan Note (Signed)
Kidney function stable. On ACEI.

## 2013-08-10 NOTE — Progress Notes (Signed)
68 yo pleasant female here for 3 month follow up.   DM II- diagnosed 10 years ago. a1c deteriorated but still reasonable. Lab Results  Component Value Date   HGBA1C 7.1* 08/05/2013    On Metformin 1500 mg daily, Januvia 100 mg daily, Glipizide 2.5 mg daily. Admits to not checking CBGs daily.  Checked it a few days ago- was 83 fasting.  Denies any episodes of hypoglycemia. Has gained a few pounds over the holidays.  Wt Readings from Last 3 Encounters:  08/10/13 143 lb 12 oz (65.205 kg)  04/25/13 138 lb 12 oz (62.937 kg)  01/31/13 139 lb (63.05 kg)     HTN- at goal for diabetic. BP Readings from Last 3 Encounters:  08/10/13 106/64  04/25/13 108/68  01/31/13 130/72    On Lisinopril 10 mg daily. No CP, SOB, blurred vision or headaches.   CRI- Cr stable.    Lab Results  Component Value Date   CREATININE 1.1 08/05/2013      Patient Active Problem List   Diagnosis Date Noted  . Decreased GFR 11/27/2011  . Adjustment disorder 11/27/2011  . HYPERKALEMIA 11/30/2009  . INSOMNIA 11/21/2009  . FATIGUE 11/21/2009  . Diabetes with renal manifestations(250.4) 07/25/2009  . HYPERLIPIDEMIA 07/25/2009  . HYPERTENSION 07/25/2009  . COLONIC POLYPS, HX OF 07/25/2009   Past Medical History  Diagnosis Date  . DM (diabetes mellitus)   . Hyperlipidemia   . HTN (hypertension)   . Colon polyps    Past Surgical History  Procedure Laterality Date  . Wrist surgery  1969   History  Substance Use Topics  . Smoking status: Never Smoker   . Smokeless tobacco: Never Used  . Alcohol Use: No   Family History  Problem Relation Age of Onset  . Heart attack    . Hypertension Mother   . Hyperlipidemia Mother   . Stroke Mother    Allergies  Allergen Reactions  . Niacin     REACTION: unbearable hot flashes   Current Outpatient Prescriptions on File Prior to Visit  Medication Sig Dispense Refill  . aspirin 81 MG tablet Take 81 mg by mouth daily.      . busPIRone (BUSPAR) 15 MG  tablet TAKE 1/2 TABLET (7.5 MG TOTAL) BY MOUTH 2 (TWO) TIMES DAILY.  60 tablet  2  . glipiZIDE (GLUCOTROL XL) 2.5 MG 24 hr tablet TAKE 1 TABLET BY MOUTH ONCE A DAY  90 tablet  1  . glucose blood (FREESTYLE LITE) test strip Use as instructed  50 each  5  . JANUVIA 100 MG tablet TAKE 1 TABLET BY MOUTH ONCE DAILY.  90 tablet  1  . lisinopril (PRINIVIL,ZESTRIL) 10 MG tablet TAKE 1 TABLET BY MOUTH ONCE A DAY.  90 tablet  1  . metFORMIN (GLUCOPHAGE-XR) 500 MG 24 hr tablet Take two tablets by mouth once daily with breakfast.  60 tablet  5  . omeprazole (PRILOSEC) 20 MG capsule Take 20 mg by mouth daily as needed.        No current facility-administered medications on file prior to visit.   The PMH, PSH, Social History, Family History, Medications, and allergies have been reviewed in [facility], and have been updated if relevant.   Review of Systems       See HPI  Physical Exam BP 106/64  Pulse 62  Temp(Src) 97.6 F (36.4 C) (Oral)  Ht 5\' 5"  (1.651 m)  Wt 143 lb 12 oz (65.205 kg)  BMI 23.92 kg/m2  SpO2 97%  Wt Readings from Last 3 Encounters:  08/10/13 143 lb 12 oz (65.205 kg)  04/25/13 138 lb 12 oz (62.937 kg)  01/31/13 139 lb (63.05 kg)   General:  Well-developed,well-nourished,in no acute distress; alert,appropriate and cooperative throughout examination Head:  Normocephalic and atraumatic without obvious abnormalities. No apparent alopecia or balding. Eyes:  vision grossly intact, pupils equal, pupils round, and pupils reactive to light.   Lungs:  Normal respiratory effort, chest expands symmetrically. Lungs are clear to auscultation, no crackles or wheezes. Heart:  Normal rate and regular rhythm. S1 and S2 normal without gallop, murmur, click, rub or other extra sounds.   Extremities:  no edema Neurologic:  alert & oriented X3 and gait normal.   Skin:  Intact without suspicious lesions or rashes Psych:  Cognition and judgment appear intact. Alert and cooperative with normal attention span  and concentration. No apparent delusions, illusions, hallucinations    Assessment and Plan:

## 2013-08-10 NOTE — Patient Instructions (Signed)
Great to see you. Happy New Year. Please come to see me again in 3 months.

## 2013-08-11 ENCOUNTER — Telehealth: Payer: Self-pay | Admitting: Family Medicine

## 2013-08-11 ENCOUNTER — Telehealth: Payer: Self-pay

## 2013-08-11 NOTE — Telephone Encounter (Signed)
Relevant patient education assigned to patient using Emmi. ° °

## 2013-08-11 NOTE — Telephone Encounter (Signed)
Pt sent front office a MyChart message about colonoscopy done in August, 2014 by Dr. Juanita Craver [phone]). The results were suppose to have been sent to Dr Deborra Medina. Can you check to see if Dr. Deborra Medina has received those results and if the patient needs to come in for a follow up?

## 2013-08-11 NOTE — Telephone Encounter (Signed)
Lm on pts vm requesting a cb 

## 2013-08-15 NOTE — Telephone Encounter (Signed)
Spoke to pts husband and requested pt call back to office

## 2013-08-16 NOTE — Telephone Encounter (Signed)
Spoke to pt and informed her that we had not received colonoscopy results; states that she will have them faxed over

## 2013-10-06 ENCOUNTER — Other Ambulatory Visit: Payer: Self-pay | Admitting: Family Medicine

## 2013-11-07 ENCOUNTER — Telehealth: Payer: Self-pay | Admitting: Family Medicine

## 2013-11-07 DIAGNOSIS — E119 Type 2 diabetes mellitus without complications: Secondary | ICD-10-CM

## 2013-11-07 NOTE — Telephone Encounter (Signed)
Patient has an appointment for a follow up on 11/16/13.  Patient wants to know if you want her to have lab work done before her appointment.

## 2013-11-08 NOTE — Telephone Encounter (Signed)
Yes she needs an a1c and CMET (250.00).

## 2013-11-08 NOTE — Telephone Encounter (Signed)
Lm on pts vm informing her future orders have been entered for her to complete before OV

## 2013-11-09 ENCOUNTER — Ambulatory Visit: Payer: Medicare Other | Admitting: Family Medicine

## 2013-11-16 ENCOUNTER — Ambulatory Visit: Payer: Medicare Other | Admitting: Family Medicine

## 2013-12-01 ENCOUNTER — Other Ambulatory Visit: Payer: Medicare Other

## 2013-12-02 ENCOUNTER — Other Ambulatory Visit (INDEPENDENT_AMBULATORY_CARE_PROVIDER_SITE_OTHER): Payer: Medicare Other

## 2013-12-02 DIAGNOSIS — I10 Essential (primary) hypertension: Secondary | ICD-10-CM

## 2013-12-02 DIAGNOSIS — E875 Hyperkalemia: Secondary | ICD-10-CM

## 2013-12-02 DIAGNOSIS — E119 Type 2 diabetes mellitus without complications: Secondary | ICD-10-CM

## 2013-12-02 LAB — COMPREHENSIVE METABOLIC PANEL
ALBUMIN: 3.7 g/dL (ref 3.5–5.2)
ALT: 20 U/L (ref 0–35)
AST: 19 U/L (ref 0–37)
Alkaline Phosphatase: 77 U/L (ref 39–117)
BILIRUBIN TOTAL: 0.4 mg/dL (ref 0.3–1.2)
BUN: 17 mg/dL (ref 6–23)
CO2: 25 meq/L (ref 19–32)
Calcium: 9.5 mg/dL (ref 8.4–10.5)
Chloride: 108 mEq/L (ref 96–112)
Creatinine, Ser: 1 mg/dL (ref 0.4–1.2)
GFR: 57.61 mL/min — AB (ref >60.00)
Glucose, Bld: 133 mg/dL — ABNORMAL HIGH (ref 70–99)
POTASSIUM: 4.7 meq/L (ref 3.5–5.1)
SODIUM: 140 meq/L (ref 135–145)
TOTAL PROTEIN: 7.1 g/dL (ref 6.0–8.3)

## 2013-12-02 LAB — HEMOGLOBIN A1C: HEMOGLOBIN A1C: 7 % — AB (ref 4.6–6.5)

## 2013-12-05 ENCOUNTER — Ambulatory Visit (INDEPENDENT_AMBULATORY_CARE_PROVIDER_SITE_OTHER): Payer: Medicare Other | Admitting: Family Medicine

## 2013-12-05 ENCOUNTER — Encounter: Payer: Self-pay | Admitting: Family Medicine

## 2013-12-05 VITALS — BP 116/74 | HR 67 | Temp 97.6°F | Ht 65.5 in | Wt 143.2 lb

## 2013-12-05 DIAGNOSIS — R350 Frequency of micturition: Secondary | ICD-10-CM

## 2013-12-05 DIAGNOSIS — E1129 Type 2 diabetes mellitus with other diabetic kidney complication: Secondary | ICD-10-CM

## 2013-12-05 DIAGNOSIS — K219 Gastro-esophageal reflux disease without esophagitis: Secondary | ICD-10-CM

## 2013-12-05 DIAGNOSIS — R944 Abnormal results of kidney function studies: Secondary | ICD-10-CM

## 2013-12-05 LAB — POCT URINALYSIS DIPSTICK
Bilirubin, UA: NEGATIVE
Glucose, UA: NEGATIVE
Ketones, UA: NEGATIVE
NITRITE UA: NEGATIVE
PH UA: 5
Spec Grav, UA: >=1.03
Urobilinogen, UA: 0.2

## 2013-12-05 NOTE — Assessment & Plan Note (Signed)
Restart omeprazole.

## 2013-12-05 NOTE — Assessment & Plan Note (Addendum)
UA with trace LE.  Reassurance provided but will send for cx.

## 2013-12-05 NOTE — Patient Instructions (Signed)
Good to see you. Let's continue your current medications, restart your prilosec and follow up in 3 months.

## 2013-12-05 NOTE — Assessment & Plan Note (Signed)
GFR improved.

## 2013-12-05 NOTE — Assessment & Plan Note (Signed)
Stable on current rx. Hopefully once she retires, she can make better food choices and increase physical activity. Follow up in 3 months.

## 2013-12-05 NOTE — Progress Notes (Signed)
Pre visit review using our clinic review tool, if applicable. No additional management support is needed unless otherwise documented below in the visit note. 

## 2013-12-05 NOTE — Addendum Note (Signed)
Addended by: Emelia Salisbury C on: 12/05/2013 09:20 AM   Modules accepted: Orders

## 2013-12-05 NOTE — Addendum Note (Signed)
Addended by: Emelia Salisbury C on: 12/05/2013 09:28 AM   Modules accepted: Orders

## 2013-12-05 NOTE — Progress Notes (Signed)
68 yo pleasant female here for 3 month follow up.   DM II- diagnosed 10 years ago. a1c improved but she feels it is not well controlled.  Lab Results  Component Value Date   HGBA1C 7.0* 12/02/2013    On Metformin 1500 mg daily, Januvia 100 mg daily, Glipizide 2.5 mg daily. Admits to not checking CBGs daily.  Checked it a few days ago- was 83 fasting.  Denies any episodes of hypoglycemia. Has gained a few pounds and feels she is not sticking well to her diet.  Work is stressful but she is quitting in 18 days!   Wt Readings from Last 3 Encounters:  12/05/13 143 lb 4 oz (64.978 kg)  08/10/13 143 lb 12 oz (65.205 kg)  04/25/13 138 lb 12 oz (62.937 kg)   Reflux has been worse but stopped taking PPI.  HTN- at goal for diabetic. BP Readings from Last 3 Encounters:  12/05/13 116/74  08/10/13 106/64  04/25/13 108/68    On Lisinopril 10 mg daily. No CP, SOB, blurred vision or headaches.   CRI- Cr stable/improved.   Feels she has increased urinary frequency and incomplete bladder emptying.  No dysuria. Lab Results  Component Value Date   CREATININE 1.0 12/02/2013      Patient Active Problem List   Diagnosis Date Noted  . GERD (gastroesophageal reflux disease) 12/05/2013  . Decreased GFR 11/27/2011  . Adjustment disorder 11/27/2011  . HYPERKALEMIA 11/30/2009  . INSOMNIA 11/21/2009  . FATIGUE 11/21/2009  . Diabetes mellitus with renal manifestation 07/25/2009  . HYPERLIPIDEMIA 07/25/2009  . HYPERTENSION 07/25/2009  . COLONIC POLYPS, HX OF 07/25/2009   Past Medical History  Diagnosis Date  . DM (diabetes mellitus)   . Hyperlipidemia   . HTN (hypertension)   . Colon polyps    Past Surgical History  Procedure Laterality Date  . Wrist surgery  1969   History  Substance Use Topics  . Smoking status: Never Smoker   . Smokeless tobacco: Never Used  . Alcohol Use: No   Family History  Problem Relation Age of Onset  . Heart attack    . Hypertension Mother   .  Hyperlipidemia Mother   . Stroke Mother    Allergies  Allergen Reactions  . Niacin     REACTION: unbearable hot flashes   Current Outpatient Prescriptions on File Prior to Visit  Medication Sig Dispense Refill  . aspirin 81 MG tablet Take 81 mg by mouth daily.      . busPIRone (BUSPAR) 15 MG tablet TAKE 1/2 TABLET (7.5 MG TOTAL) BY MOUTH 2 (TWO) TIMES DAILY.  60 tablet  2  . glipiZIDE (GLUCOTROL XL) 2.5 MG 24 hr tablet TAKE 1 TABLET BY MOUTH ONCE A DAY  90 tablet  1  . glucose blood (FREESTYLE LITE) test strip Use as instructed  50 each  5  . JANUVIA 100 MG tablet TAKE 1 TABLET BY MOUTH ONCE DAILY.  90 tablet  1  . lisinopril (PRINIVIL,ZESTRIL) 10 MG tablet TAKE 1 TABLET BY MOUTH ONCE A DAY.  90 tablet  1  . metFORMIN (GLUCOPHAGE-XR) 500 MG 24 hr tablet TAKE 2 TABLETS BY MOUTH EVERY MORNING WITH BREAKFAST.  60 tablet  3  . omeprazole (PRILOSEC) 20 MG capsule Take 20 mg by mouth daily as needed.        No current facility-administered medications on file prior to visit.   The PMH, PSH, Social History, Family History, Medications, and allergies have been reviewed in [facility], and  have been updated if relevant.   Review of Systems       See HPI  Physical Exam BP 116/74  Pulse 67  Temp(Src) 97.6 F (36.4 C) (Oral)  Ht 5' 5.5" (1.664 m)  Wt 143 lb 4 oz (64.978 kg)  BMI 23.47 kg/m2  SpO2 98% Wt Readings from Last 3 Encounters:  12/05/13 143 lb 4 oz (64.978 kg)  08/10/13 143 lb 12 oz (65.205 kg)  04/25/13 138 lb 12 oz (62.937 kg)   General:  Well-developed,well-nourished,in no acute distress; alert,appropriate and cooperative throughout examination Head:  Normocephalic and atraumatic without obvious abnormalities. No apparent alopecia or balding. Eyes:  vision grossly intact, pupils equal, pupils round, and pupils reactive to light.   Lungs:  Normal respiratory effort, chest expands symmetrically. Lungs are clear to auscultation, no crackles or wheezes. Heart:  Normal rate and  regular rhythm. S1 and S2 normal without gallop, murmur, click, rub or other extra sounds.   Abd:  Soft, NT, no CVA or suprapubic tenderness Extremities:  no edema Neurologic:  alert & oriented X3 and gait normal.   Skin:  Intact without suspicious lesions or rashes Psych:  Cognition and judgment appear intact. Alert and cooperative with normal attention span and concentration. No apparent delusions, illusions, hallucinations.

## 2013-12-08 LAB — URINE CULTURE: Colony Count: >=100000

## 2013-12-09 MED ORDER — CIPROFLOXACIN HCL 250 MG PO TABS: 250.0000 mg | ORAL_TABLET | Freq: Two times a day (BID) | ORAL | Status: DC

## 2013-12-09 NOTE — Addendum Note (Signed)
Addended by: Modena Nunnery on: 12/09/2013 09:38 AM   Modules accepted: Orders

## 2014-01-10 ENCOUNTER — Telehealth: Payer: Self-pay | Admitting: Family Medicine

## 2014-01-10 NOTE — Telephone Encounter (Signed)
Pt rec'd info from her insurance, [facility], that if she receives a CPE prior to the end of June she will receive a $75 gift card.  However, you have no available time for the month of June for a CPE. Can you accommodate her an apptmt for CPE prior to the end of June? Thank you.

## 2014-01-10 NOTE — Telephone Encounter (Signed)
Yes but not last appt of day please.

## 2014-01-11 NOTE — Telephone Encounter (Signed)
Scheduled for 01/25/2014

## 2014-01-13 ENCOUNTER — Other Ambulatory Visit: Payer: Self-pay | Admitting: Family Medicine

## 2014-01-13 ENCOUNTER — Other Ambulatory Visit (INDEPENDENT_AMBULATORY_CARE_PROVIDER_SITE_OTHER): Payer: Medicare Other

## 2014-01-13 DIAGNOSIS — E1129 Type 2 diabetes mellitus with other diabetic kidney complication: Secondary | ICD-10-CM

## 2014-01-13 DIAGNOSIS — E875 Hyperkalemia: Secondary | ICD-10-CM

## 2014-01-13 DIAGNOSIS — I10 Essential (primary) hypertension: Secondary | ICD-10-CM

## 2014-01-13 DIAGNOSIS — Z0000 Encounter for general adult medical examination without abnormal findings: Secondary | ICD-10-CM

## 2014-01-13 DIAGNOSIS — R5383 Other fatigue: Secondary | ICD-10-CM

## 2014-01-13 DIAGNOSIS — E785 Hyperlipidemia, unspecified: Secondary | ICD-10-CM

## 2014-01-13 DIAGNOSIS — R5381 Other malaise: Secondary | ICD-10-CM

## 2014-01-13 LAB — LIPID PANEL
Cholesterol: 209 mg/dL — ABNORMAL HIGH (ref 0–200)
HDL: 35 mg/dL — AB (ref >39.00)
LDL Cholesterol: 118 mg/dL — ABNORMAL HIGH (ref 0–99)
NonHDL: 174
TRIGLYCERIDES: 279 mg/dL — AB (ref 0.0–149.0)
Total CHOL/HDL Ratio: 6
VLDL: 55.8 mg/dL — ABNORMAL HIGH (ref 0.0–40.0)

## 2014-01-13 LAB — COMPREHENSIVE METABOLIC PANEL
ALT: 14 U/L (ref 0–35)
AST: 18 U/L (ref 0–37)
Albumin: 3.9 g/dL (ref 3.5–5.2)
Alkaline Phosphatase: 77 U/L (ref 39–117)
BUN: 18 mg/dL (ref 6–23)
CALCIUM: 9.9 mg/dL (ref 8.4–10.5)
CO2: 27 mEq/L (ref 19–32)
CREATININE: 1 mg/dL (ref 0.4–1.2)
Chloride: 109 mEq/L (ref 96–112)
GFR: 61.77 mL/min (ref >60.00)
Glucose, Bld: 164 mg/dL — ABNORMAL HIGH (ref 70–99)
Potassium: 5.4 mEq/L — ABNORMAL HIGH (ref 3.5–5.1)
Sodium: 142 mEq/L (ref 135–145)
Total Bilirubin: 0.4 mg/dL (ref 0.2–1.2)
Total Protein: 7.2 g/dL (ref 6.0–8.3)

## 2014-01-13 LAB — CBC WITH DIFFERENTIAL/PLATELET
BASOS PCT: 0.8 % (ref 0.0–3.0)
Basophils Absolute: 0.1 10*3/uL (ref 0.0–0.1)
Eosinophils Absolute: 0.4 10*3/uL (ref 0.0–0.7)
Eosinophils Relative: 5.9 % — ABNORMAL HIGH (ref 0.0–5.0)
HEMATOCRIT: 38.2 % (ref 36.0–46.0)
HEMOGLOBIN: 12.6 g/dL (ref 12.0–15.0)
Lymphocytes Relative: 29 % (ref 12.0–46.0)
Lymphs Abs: 1.9 10*3/uL (ref 0.7–4.0)
MCHC: 33.1 g/dL (ref 30.0–36.0)
MCV: 93 fl (ref 78.0–100.0)
MONOS PCT: 7.3 % (ref 3.0–12.0)
Monocytes Absolute: 0.5 10*3/uL (ref 0.1–1.0)
NEUTROS ABS: 3.7 10*3/uL (ref 1.4–7.7)
Neutrophils Relative %: 57 % (ref 43.0–77.0)
Platelets: 274 10*3/uL (ref 150.0–400.0)
RBC: 4.11 Mil/uL (ref 3.87–5.11)
RDW: 13.5 % (ref 11.5–15.5)
WBC: 6.6 10*3/uL (ref 4.0–10.5)

## 2014-01-13 LAB — HEMOGLOBIN A1C: Hgb A1c MFr Bld: 7.3 % — ABNORMAL HIGH (ref 4.6–6.5)

## 2014-01-13 LAB — TSH: TSH: 4.48 u[IU]/mL (ref 0.35–4.50)

## 2014-01-13 MED ORDER — SODIUM POLYSTYRENE SULFONATE 15 GM/60ML PO SUSP: 15.0000 g | Freq: Once | ORAL | Status: DC

## 2014-01-17 NOTE — Addendum Note (Signed)
Addended by: Modena Nunnery on: 01/17/2014 01:56 PM   Modules accepted: Orders

## 2014-01-23 ENCOUNTER — Other Ambulatory Visit (INDEPENDENT_AMBULATORY_CARE_PROVIDER_SITE_OTHER): Payer: Medicare Other

## 2014-01-23 DIAGNOSIS — E875 Hyperkalemia: Secondary | ICD-10-CM

## 2014-01-23 LAB — BASIC METABOLIC PANEL
BUN: 17 mg/dL (ref 6–23)
CO2: 28 meq/L (ref 19–32)
Calcium: 9.4 mg/dL (ref 8.4–10.5)
Chloride: 110 mEq/L (ref 96–112)
Creatinine, Ser: 1 mg/dL (ref 0.4–1.2)
GFR: 61.03 mL/min (ref >60.00)
Glucose, Bld: 139 mg/dL — ABNORMAL HIGH (ref 70–99)
POTASSIUM: 4.7 meq/L (ref 3.5–5.1)
SODIUM: 144 meq/L (ref 135–145)

## 2014-01-24 ENCOUNTER — Encounter: Payer: Self-pay | Admitting: *Deleted

## 2014-01-25 ENCOUNTER — Encounter: Payer: Self-pay | Admitting: Family Medicine

## 2014-01-25 ENCOUNTER — Other Ambulatory Visit (HOSPITAL_COMMUNITY)
Admission: RE | Admit: 2014-01-25 | Discharge: 2014-01-25 | Disposition: A | Discharge status: Home / Self Care | Payer: Medicare Other | Source: Ambulatory Visit | Attending: Family Medicine | Admitting: Family Medicine

## 2014-01-25 ENCOUNTER — Ambulatory Visit (INDEPENDENT_AMBULATORY_CARE_PROVIDER_SITE_OTHER): Payer: Medicare Other | Admitting: Family Medicine

## 2014-01-25 VITALS — BP 128/70 | HR 75 | Temp 97.6°F | Ht 65.0 in | Wt 143.0 lb

## 2014-01-25 DIAGNOSIS — H5712 Ocular pain, left eye: Secondary | ICD-10-CM

## 2014-01-25 DIAGNOSIS — E875 Hyperkalemia: Secondary | ICD-10-CM

## 2014-01-25 DIAGNOSIS — R94120 Abnormal auditory function study: Secondary | ICD-10-CM | POA: Insufficient documentation

## 2014-01-25 DIAGNOSIS — N183 Chronic kidney disease, stage 3 unspecified: Secondary | ICD-10-CM

## 2014-01-25 DIAGNOSIS — Z86010 Personal history of colonic polyps: Secondary | ICD-10-CM

## 2014-01-25 DIAGNOSIS — E0822 Diabetes mellitus due to underlying condition with diabetic chronic kidney disease: Secondary | ICD-10-CM

## 2014-01-25 DIAGNOSIS — E785 Hyperlipidemia, unspecified: Secondary | ICD-10-CM

## 2014-01-25 DIAGNOSIS — H5710 Ocular pain, unspecified eye: Secondary | ICD-10-CM

## 2014-01-25 DIAGNOSIS — I10 Essential (primary) hypertension: Secondary | ICD-10-CM

## 2014-01-25 DIAGNOSIS — Z1151 Encounter for screening for human papillomavirus (HPV): Secondary | ICD-10-CM | POA: Insufficient documentation

## 2014-01-25 DIAGNOSIS — Z01419 Encounter for gynecological examination (general) (routine) without abnormal findings: Secondary | ICD-10-CM | POA: Insufficient documentation

## 2014-01-25 DIAGNOSIS — Z0000 Encounter for general adult medical examination without abnormal findings: Secondary | ICD-10-CM

## 2014-01-25 LAB — COMPREHENSIVE METABOLIC PANEL
ALBUMIN: 4.1 g/dL (ref 3.5–5.2)
ALT: 15 U/L (ref 0–35)
AST: 19 U/L (ref 0–37)
Alkaline Phosphatase: 80 U/L (ref 39–117)
BUN: 16 mg/dL (ref 6–23)
CHLORIDE: 110 meq/L (ref 96–112)
CO2: 28 meq/L (ref 19–32)
Calcium: 9.8 mg/dL (ref 8.4–10.5)
Creatinine, Ser: 1 mg/dL (ref 0.4–1.2)
GFR: 58.25 mL/min — AB (ref >60.00)
Glucose, Bld: 115 mg/dL — ABNORMAL HIGH (ref 70–99)
POTASSIUM: 5.1 meq/L (ref 3.5–5.1)
SODIUM: 143 meq/L (ref 135–145)
TOTAL PROTEIN: 7.5 g/dL (ref 6.0–8.3)
Total Bilirubin: 0.3 mg/dL (ref 0.2–1.2)

## 2014-01-25 LAB — SEDIMENTATION RATE: SED RATE: 28 mm/h — AB (ref 0–22)

## 2014-01-25 MED ORDER — GLIPIZIDE ER 5 MG PO TB24: ORAL_TABLET | ORAL | Status: DC

## 2014-01-25 NOTE — Patient Instructions (Addendum)
Check with your insurance to see if they will cover the shingles shot.  We are increasing your Glipizide to 5 mg daily- ok to take two of your 2.5 mg tablets until you run out.  We will call you also with an audiology referral.

## 2014-01-25 NOTE — Assessment & Plan Note (Signed)
At goal for diabetic. On ACEI

## 2014-01-25 NOTE — Assessment & Plan Note (Signed)
The patients weight, height, BMI and visual acuity have been recorded in the chart I have made referrals, counseling and provided education to the patient based review of the above and I have provided the pt with a written personalized care plan for preventive services.  Pap smear today. She will call insurance company about zostavax.

## 2014-01-25 NOTE — Assessment & Plan Note (Signed)
Low suspicion for TA so will not start pred yet or refer for biopsy.  Will check SED rate today, if elevated ,will consider ENT referral. Seems more consistent with occipital neuralgia likely due to previous shingles infection. Advised zostavax.  She will check with insurance company.

## 2014-01-25 NOTE — Assessment & Plan Note (Signed)
Not quite at goal but she is actively working on diet and lifestyle changes. Recheck lipids in 3 months. No changes today to rx.

## 2014-01-25 NOTE — Assessment & Plan Note (Signed)
Refer to audiology.

## 2014-01-25 NOTE — Progress Notes (Signed)
68 yo pleasant female here for annual medicare wellness visit.  I have personally reviewed the Medicare Annual Wellness questionnaire and have noted 1. The patient's medical and social history 2. Their use of alcohol, tobacco or illicit drugs 3. Their current medications and supplements 4. The patient's functional ability including ADL's, fall risks, home safety risks and hearing or visual             impairment. 5. Diet and physical activities 6. Evidence for depression or mood disorders  Mammogram 03/16/13 Pneumovax 04/25/13 Tdap 08/09/07 Pap 2011 LMP 10 years ago.  No post menopausal bleeding. Colonoscopy 03/24/2013- Dr. Collene Mares- 10 year recall. Has never had zostavax. Eye exam UTD  End of life wishes discussed and updated in Social History.  Pain lateral to left eye- intermittent for this past year. Per pt, eye doctor was not concerned last year.  At times, does seem to radiate down to her cheek.  No visual changes.  Did have a bad case of shingles in same distribution when she was in her 38s.  Denies any new body aches but always has "aches and pain."  DM II- diagnosed 10 years ago.  a1c deteriorated this month. Urine micro neg 08/10/2013  Lab Results  Component Value Date   HGBA1C 7.3* 01/13/2014    On Metformin 1500 mg daily, Januvia 100 mg daily, Glipizide 2.5 mg daily. Admits to not checking CBGs daily. Denies any episodes of hypoglycemia. Has gained a few pounds and feels she is not sticking well to her diet.  She just started going to the gym and wants to improve her diet and lifestyle now that she retired. Wt Readings from Last 3 Encounters:  01/25/14 143 lb (64.864 kg)  12/05/13 143 lb 4 oz (64.978 kg)  08/10/13 143 lb 12 oz (65.205 kg)     CKD III- due to diabetes, HTN- has seen Dr. Candiss Norse in past.  Has not been seen by nephrologist since 2013. Lab Results  Component Value Date   CREATININE 1.0 01/23/2014     Potassium was elevated but improved on labs done two days  ago, s/p one dose of kayexelate. Lab Results  Component Value Date   NA 144 01/23/2014   K 4.7 01/23/2014   CL 110 01/23/2014   CO2 28 01/23/2014     Wt Readings from Last 3 Encounters:  01/25/14 143 lb (64.864 kg)  12/05/13 143 lb 4 oz (64.978 kg)  08/10/13 143 lb 12 oz (65.205 kg)   HTN- at goal for diabetic. BP Readings from Last 3 Encounters:  01/25/14 128/70  12/05/13 116/74  08/10/13 106/64    On Lisinopril 10 mg daily. No CP, SOB, blurred vision or headaches.   CRI- Cr stable/improved.   Feels she has increased urinary frequency and incomplete bladder emptying.  No dysuria. Lab Results  Component Value Date   CREATININE 1.0 01/23/2014      Patient Active Problem List   Diagnosis Date Noted  . Failed hearing screening 01/25/2014  . Pain around left eye 01/25/2014  . GERD (gastroesophageal reflux disease) 12/05/2013  . Urinary frequency 12/05/2013  . CKD (chronic kidney disease) stage 3, GFR 30-59 ml/min 11/27/2011  . Adjustment disorder 11/27/2011  . HYPERKALEMIA 11/30/2009  . INSOMNIA 11/21/2009  . FATIGUE 11/21/2009  . Diabetes mellitus with renal manifestation 07/25/2009  . HYPERLIPIDEMIA 07/25/2009  . HYPERTENSION 07/25/2009  . COLONIC POLYPS, HX OF 07/25/2009   Past Medical History  Diagnosis Date  . DM (diabetes mellitus)   .  Hyperlipidemia   . HTN (hypertension)   . Colon polyps    Past Surgical History  Procedure Laterality Date  . Wrist surgery  1969   History  Substance Use Topics  . Smoking status: Never Smoker   . Smokeless tobacco: Never Used  . Alcohol Use: No   Family History  Problem Relation Age of Onset  . Heart attack    . Hypertension Mother   . Hyperlipidemia Mother   . Stroke Mother    Allergies  Allergen Reactions  . Niacin     REACTION: unbearable hot flashes   Current Outpatient Prescriptions on File Prior to Visit  Medication Sig Dispense Refill  . aspirin 81 MG tablet Take 81 mg by mouth daily.      .  busPIRone (BUSPAR) 15 MG tablet TAKE 1/2 TABLET (7.5 MG TOTAL) BY MOUTH 2 (TWO) TIMES DAILY.  60 tablet  2  . glucose blood (FREESTYLE LITE) test strip Use as instructed  50 each  5  . JANUVIA 100 MG tablet TAKE 1 TABLET BY MOUTH ONCE DAILY.  90 tablet  1  . lisinopril (PRINIVIL,ZESTRIL) 10 MG tablet TAKE 1 TABLET BY MOUTH ONCE A DAY.  90 tablet  1  . metFORMIN (GLUCOPHAGE-XR) 500 MG 24 hr tablet TAKE 2 TABLETS BY MOUTH EVERY MORNING WITH BREAKFAST.  60 tablet  3  . omeprazole (PRILOSEC) 20 MG capsule Take 20 mg by mouth daily as needed.       . sodium polystyrene (KAYEXALATE) 15 GM/60ML suspension Take 60 mLs (15 g total) by mouth once.  500 mL  0   No current facility-administered medications on file prior to visit.   The PMH, PSH, Social History, Family History, Medications, and allergies have been reviewed in [facility], and have been updated if relevant.   Review of Systems       See HPI Patient reports no  vision/ hearing changes,anorexia, weight change, fever ,adenopathy, persistant / recurrent hoarseness, swallowing issues, chest pain, edema,persistant / recurrent cough, hemoptysis, dyspnea(rest, exertional, paroxysmal nocturnal), gastrointestinal  bleeding (melena, rectal bleeding), abdominal pain, excessive heart burn, GU symptoms(dysuria, hematuria, pyuria, voiding/incontinence  Issues) syncope, focal weakness, severe memory loss, concerning skin lesions, depression, anxiety, abnormal bruising/bleeding, major joint swelling, breast masses or abnormal vaginal bleeding.     Physical Exam BP 128/70  Pulse 75  Temp(Src) 97.6 F (36.4 C) (Oral)  Ht 5\' 5"  (1.651 m)  Wt 143 lb (64.864 kg)  BMI 23.80 kg/m2  SpO2 95% Wt Readings from Last 3 Encounters:  01/25/14 143 lb (64.864 kg)  12/05/13 143 lb 4 oz (64.978 kg)  08/10/13 143 lb 12 oz (65.205 kg)   General:  Well-developed,well-nourished,in no acute distress; alert,appropriate and cooperative throughout examination Head:  normocephalic  and atraumatic.   Eyes:  vision grossly intact, pupils equal, pupils round, and pupils reactive to light.   No abnormality or pain noted around temporal artery Ears:  R ear normal and L ear normal.   Nose:  no external deformity.   Mouth:  good dentition.   Neck:  No deformities, masses, or tenderness noted. Breasts:  No mass, nodules, thickening, tenderness, bulging, retraction, inflamation, nipple discharge or skin changes noted.   Lungs:  Normal respiratory effort, chest expands symmetrically. Lungs are clear to auscultation, no crackles or wheezes. Heart:  Normal rate and regular rhythm. S1 and S2 normal without gallop, murmur, click, rub or other extra sounds. Abdomen:  Bowel sounds positive,abdomen soft and non-tender without masses, organomegaly or hernias  noted. Rectal:  no external abnormalities.   Genitalia:  Pelvic Exam:        External: normal female genitalia without lesions or masses        Vagina: normal without lesions or masses        Cervix: normal without lesions or masses        Adnexa: normal bimanual exam without masses or fullness        Uterus: normal by palpation        Pap smear: performed Msk:  No deformity or scoliosis noted of thoracic or lumbar spine.   Extremities:  No clubbing, cyanosis, edema, or deformity noted with normal full range of motion of all joints.   Neurologic:  alert & oriented X3 and gait normal.   Skin:  Intact without suspicious lesions or rashes Cervical Nodes:  No lymphadenopathy noted Axillary Nodes:  No palpable lymphadenopathy Psych:  Cognition and judgment appear intact. Alert and cooperative with normal attention span and concentration. No apparent delusions, illusions, hallucinations\

## 2014-01-25 NOTE — Assessment & Plan Note (Signed)
Deteriorated. Increase glipizide to 5 mg ER daily. Follow up in 3 months. The patient indicates understanding of these issues and agrees with the plan.

## 2014-01-25 NOTE — Assessment & Plan Note (Signed)
Stable. Neg urine micro in past year.

## 2014-01-25 NOTE — Progress Notes (Signed)
Pre visit review using our clinic review tool, if applicable. No additional management support is needed unless otherwise documented below in the visit note. 

## 2014-01-25 NOTE — Assessment & Plan Note (Signed)
Improved s/p one dose of kayexalate  Recheck again today.

## 2014-01-27 ENCOUNTER — Encounter: Payer: Self-pay | Admitting: *Deleted

## 2014-01-27 LAB — CYTOLOGY - PAP

## 2014-02-08 ENCOUNTER — Other Ambulatory Visit: Payer: Self-pay | Admitting: Family Medicine

## 2014-03-15 ENCOUNTER — Other Ambulatory Visit: Payer: Self-pay | Admitting: Family Medicine

## 2014-03-28 ENCOUNTER — Encounter: Payer: Self-pay | Admitting: Family Medicine

## 2014-04-26 ENCOUNTER — Other Ambulatory Visit (INDEPENDENT_AMBULATORY_CARE_PROVIDER_SITE_OTHER): Payer: Medicare Other

## 2014-04-26 DIAGNOSIS — E875 Hyperkalemia: Secondary | ICD-10-CM

## 2014-04-26 DIAGNOSIS — N189 Chronic kidney disease, unspecified: Secondary | ICD-10-CM

## 2014-04-26 DIAGNOSIS — I10 Essential (primary) hypertension: Secondary | ICD-10-CM

## 2014-04-26 DIAGNOSIS — E1329 Other specified diabetes mellitus with other diabetic kidney complication: Secondary | ICD-10-CM

## 2014-04-26 DIAGNOSIS — E785 Hyperlipidemia, unspecified: Secondary | ICD-10-CM

## 2014-04-26 DIAGNOSIS — E0822 Diabetes mellitus due to underlying condition with diabetic chronic kidney disease: Secondary | ICD-10-CM

## 2014-04-26 LAB — COMPREHENSIVE METABOLIC PANEL
ALK PHOS: 81 U/L (ref 39–117)
ALT: 18 U/L (ref 0–35)
AST: 20 U/L (ref 0–37)
Albumin: 3.8 g/dL (ref 3.5–5.2)
BUN: 17 mg/dL (ref 6–23)
CO2: 26 mEq/L (ref 19–32)
CREATININE: 1.1 mg/dL (ref 0.4–1.2)
Calcium: 9.3 mg/dL (ref 8.4–10.5)
Chloride: 111 mEq/L (ref 96–112)
GFR: 53.87 mL/min — ABNORMAL LOW (ref >60.00)
Glucose, Bld: 95 mg/dL (ref 70–99)
Potassium: 4.9 mEq/L (ref 3.5–5.1)
Sodium: 141 mEq/L (ref 135–145)
Total Bilirubin: 0.3 mg/dL (ref 0.2–1.2)
Total Protein: 7.4 g/dL (ref 6.0–8.3)

## 2014-04-26 LAB — LIPID PANEL
CHOL/HDL RATIO: 6
Cholesterol: 199 mg/dL (ref 0–200)
HDL: 31 mg/dL — AB (ref >39.00)
NonHDL: 168
TRIGLYCERIDES: 340 mg/dL — AB (ref 0.0–149.0)
VLDL: 68 mg/dL — ABNORMAL HIGH (ref 0.0–40.0)

## 2014-04-26 LAB — HEMOGLOBIN A1C: Hgb A1c MFr Bld: 7 % — ABNORMAL HIGH (ref 4.6–6.5)

## 2014-04-26 LAB — LDL CHOLESTEROL, DIRECT: Direct LDL: 106.9 mg/dL

## 2014-05-03 ENCOUNTER — Ambulatory Visit (INDEPENDENT_AMBULATORY_CARE_PROVIDER_SITE_OTHER): Payer: Medicare Other | Admitting: Family Medicine

## 2014-05-03 ENCOUNTER — Encounter: Payer: Self-pay | Admitting: Family Medicine

## 2014-05-03 VITALS — BP 118/68 | HR 68 | Temp 98.0°F | Wt 145.8 lb

## 2014-05-03 DIAGNOSIS — F411 Generalized anxiety disorder: Secondary | ICD-10-CM

## 2014-05-03 DIAGNOSIS — E875 Hyperkalemia: Secondary | ICD-10-CM

## 2014-05-03 DIAGNOSIS — E785 Hyperlipidemia, unspecified: Secondary | ICD-10-CM

## 2014-05-03 DIAGNOSIS — N183 Chronic kidney disease, stage 3 unspecified: Secondary | ICD-10-CM

## 2014-05-03 DIAGNOSIS — I10 Essential (primary) hypertension: Secondary | ICD-10-CM

## 2014-05-03 DIAGNOSIS — E1169 Type 2 diabetes mellitus with other specified complication: Secondary | ICD-10-CM | POA: Insufficient documentation

## 2014-05-03 DIAGNOSIS — N189 Chronic kidney disease, unspecified: Secondary | ICD-10-CM

## 2014-05-03 DIAGNOSIS — Z23 Encounter for immunization: Secondary | ICD-10-CM

## 2014-05-03 DIAGNOSIS — E1329 Other specified diabetes mellitus with other diabetic kidney complication: Secondary | ICD-10-CM

## 2014-05-03 DIAGNOSIS — K219 Gastro-esophageal reflux disease without esophagitis: Secondary | ICD-10-CM

## 2014-05-03 DIAGNOSIS — F4321 Adjustment disorder with depressed mood: Secondary | ICD-10-CM | POA: Insufficient documentation

## 2014-05-03 DIAGNOSIS — E0822 Diabetes mellitus due to underlying condition with diabetic chronic kidney disease: Secondary | ICD-10-CM

## 2014-05-03 MED ORDER — PRAVASTATIN SODIUM 10 MG PO TABS: 10.0000 mg | ORAL_TABLET | Freq: Every day | ORAL | Status: DC

## 2014-05-03 NOTE — Assessment & Plan Note (Signed)
Well controlled on current rx. No changes. 

## 2014-05-03 NOTE — Assessment & Plan Note (Signed)
Improved but cholesterol not at goal for diabetic. See below.

## 2014-05-03 NOTE — Addendum Note (Signed)
Addended by: Modena Nunnery on: 05/03/2014 11:30 AM   Modules accepted: Orders

## 2014-05-03 NOTE — Assessment & Plan Note (Signed)
Deteriorated. She did not realize she was not taking her buspar properly. Advised to take as directed- 7.5 mg twice daily. Call or return to clinic prn if these symptoms worsen or fail to improve as anticipated. The patient indicates understanding of these issues and agrees with the plan.

## 2014-05-03 NOTE — Progress Notes (Signed)
Subjective:   Patient ID: Wanda [name], female    DOB: 18-Jan-1946, 68 y.o.   MRN: [id number]  Wanda [name] is a pleasant 68 y.o. year old female who presents to clinic today with Follow-up and Anxiety  on 05/03/2014  HPI:  DM II- diagnosed 10 years ago.  Urine micro neg 08/10/2013.  Lab Results  Component Value Date   HGBA1C 7.0* 04/26/2014    On Metformin 1500 mg daily, Januvia 100 mg daily, Glipizide 5 mg daily. Admits to not checking CBGs daily. Denies any episodes of hypoglycemia.  Triglycerides deteriorated.  Was fasting for this sample, per pt. Lab Results  Component Value Date   CHOL 199 04/26/2014   HDL 31.00* 04/26/2014   LDLCALC 118* 01/13/2014   LDLDIRECT 106.9 04/26/2014   TRIG 340.0* 04/26/2014   CHOLHDL 6 04/26/2014    Wt Readings from Last 3 Encounters:  05/03/14 145 lb 12 oz (66.112 kg)  01/25/14 143 lb (64.864 kg)  12/05/13 143 lb 4 oz (64.978 kg)     CKD III- due to diabetes, HTN- has seen Dr. Candiss Norse. Lab Results  Component Value Date   CREATININE 1.1 04/26/2014     Lab Results  Component Value Date   NA 141 04/26/2014   K 4.9 04/26/2014   CL 111 04/26/2014   CO2 26 04/26/2014     HTN- at goal for diabetic. On Lisinopril 10 mg daily. No CP, SOB, blurred vision or headaches.  Anxiety- has only been taking her buspar 7.5 mg in the morning.  She has noticed she is more "nervous."  Not sleeping as well.  Retired but she does have some financial stressors.  Appetite good. Denies feeling depressed. No SI or HI. Wt Readings from Last 3 Encounters:  05/03/14 145 lb 12 oz (66.112 kg)  01/25/14 143 lb (64.864 kg)  12/05/13 143 lb 4 oz (64.978 kg)      Current Outpatient Prescriptions on File Prior to Visit  Medication Sig Dispense Refill  . aspirin 81 MG tablet Take 81 mg by mouth daily.      . busPIRone (BUSPAR) 15 MG tablet TAKE 1/2 TABLET (7.5 MG TOTAL) BY MOUTH 2 (TWO) TIMES DAILY.  60 tablet  2  . glipiZIDE (GLUCOTROL XL) 5 MG 24 hr tablet TAKE 1  TABLET BY MOUTH ONCE A DAY  90 tablet  1  . glucose blood (FREESTYLE LITE) test strip Use as instructed  50 each  5  . JANUVIA 100 MG tablet TAKE 1 TABLET BY MOUTH ONCE DAILY.  90 tablet  1  . lisinopril (PRINIVIL,ZESTRIL) 10 MG tablet TAKE 1 TABLET BY MOUTH ONCE A DAY.  90 tablet  1  . metFORMIN (GLUCOPHAGE-XR) 500 MG 24 hr tablet TAKE 2 TABLETS BY MOUTH EVERY MORNING WITH BREAKFAST.  60 tablet  3  . omeprazole (PRILOSEC) 20 MG capsule Take 20 mg by mouth daily as needed.       . sodium polystyrene (KAYEXALATE) 15 GM/60ML suspension Take 60 mLs (15 g total) by mouth once.  500 mL  0   No current facility-administered medications on file prior to visit.    Allergies  Allergen Reactions  . Niacin     REACTION: unbearable hot flashes    Past Medical History  Diagnosis Date  . DM (diabetes mellitus)   . Hyperlipidemia   . HTN (hypertension)   . Colon polyps     Past Surgical History  Procedure Laterality Date  . Wrist surgery  1969  Family History  Problem Relation Age of Onset  . Heart attack    . Hypertension Mother   . Hyperlipidemia Mother   . Stroke Mother     History   Social History  . Marital Status: Married    Spouse Name: N/A    Number of Children: N/A  . Years of Education: N/A   Occupational History  . Not on file.   Social History Main Topics  . Smoking status: Never Smoker   . Smokeless tobacco: Never Used  . Alcohol Use: No  . Drug Use: No  . Sexual Activity: Not on file   Other Topics Concern  . Not on file   Social History Narrative  . No narrative on file   The PMH, PSH, Social History, Family History, Medications, and allergies have been reviewed in [facility], and have been updated if relevant.  Review of Systems See HPI No CP No SOB No DOE No LE edema No muscle aches No HA No SI or HI     Objective:    BP 118/68  Pulse 68  Temp(Src) 98 F (36.7 C) (Oral)  Wt 145 lb 12 oz (66.112 kg)  SpO2 98%   Physical Exam  Nursing  note and vitals reviewed. Constitutional: She is oriented to person, place, and time. She appears well-developed and well-nourished. No distress.  HENT:  Head: Normocephalic.  Neck: Normal range of motion.  Cardiovascular: Normal rate and regular rhythm.   Pulmonary/Chest: Effort normal and breath sounds normal. No respiratory distress. She has no wheezes.  Musculoskeletal: Normal range of motion.  Neurological: She is alert and oriented to person, place, and time.  Skin: Skin is warm and dry.  Psychiatric: She has a normal mood and affect. Her behavior is normal. Judgment and thought content normal.          Assessment & Plan:   HYPERKALEMIA  HYPERLIPIDEMIA  HYPERTENSION  CKD (chronic kidney disease) stage 3, GFR 30-59 ml/min  Diabetes mellitus due to underlying condition with diabetic chronic kidney disease  Gastroesophageal reflux disease, esophagitis presence not specified  Generalized anxiety disorder No Follow-up on file.

## 2014-05-03 NOTE — Assessment & Plan Note (Signed)
Deteriorated- She was intolerant to lipitor (myalgais). Agrees to start pravachol 10 mg daily. Follow up in 2 months.

## 2014-05-03 NOTE — Progress Notes (Signed)
Pre visit review using our clinic review tool, if applicable. No additional management support is needed unless otherwise documented below in the visit note. 

## 2014-05-03 NOTE — Assessment & Plan Note (Signed)
Cr stable. Continue ACEI for renal protection.

## 2014-05-03 NOTE — Patient Instructions (Addendum)
It was great to see you. Please make sure you are taking the second tablet of buspar.  We are adding pravastatin 10 mg daily.  Please follow up in 2 months to recheck cholesterol.

## 2014-05-26 ENCOUNTER — Other Ambulatory Visit: Payer: Self-pay | Admitting: Family Medicine

## 2014-06-23 ENCOUNTER — Encounter: Payer: Self-pay | Admitting: Family Medicine

## 2014-06-23 ENCOUNTER — Telehealth: Payer: Self-pay | Admitting: Family Medicine

## 2014-06-23 ENCOUNTER — Emergency Department (HOSPITAL_COMMUNITY)
Admission: EM | Admit: 2014-06-23 | Discharge: 2014-06-23 | Disposition: A | Discharge status: Home / Self Care | Payer: Medicare Other | Source: Home / Self Care | Attending: Family Medicine | Admitting: Family Medicine

## 2014-06-23 ENCOUNTER — Encounter (HOSPITAL_COMMUNITY): Payer: Self-pay | Admitting: Emergency Medicine

## 2014-06-23 DIAGNOSIS — G44209 Tension-type headache, unspecified, not intractable: Secondary | ICD-10-CM

## 2014-06-23 DIAGNOSIS — B029 Zoster without complications: Secondary | ICD-10-CM | POA: Diagnosis not present

## 2014-06-23 DIAGNOSIS — R1010 Upper abdominal pain, unspecified: Secondary | ICD-10-CM

## 2014-06-23 DIAGNOSIS — B019 Varicella without complication: Secondary | ICD-10-CM

## 2014-06-23 LAB — CBC WITH DIFFERENTIAL/PLATELET
BASOS PCT: 1 % (ref 0–1)
Basophils Absolute: 0.1 10*3/uL (ref 0.0–0.1)
EOS ABS: 0.4 10*3/uL (ref 0.0–0.7)
EOS PCT: 5 % (ref 0–5)
HCT: 38.4 % (ref 36.0–46.0)
Hemoglobin: 12.6 g/dL (ref 12.0–15.0)
Lymphocytes Relative: 29 % (ref 12–46)
Lymphs Abs: 2 10*3/uL (ref 0.7–4.0)
MCH: 30.1 pg (ref 26.0–34.0)
MCHC: 32.8 g/dL (ref 30.0–36.0)
MCV: 91.6 fL (ref 78.0–100.0)
Monocytes Absolute: 0.5 10*3/uL (ref 0.1–1.0)
Monocytes Relative: 8 % (ref 3–12)
NEUTROS PCT: 57 % (ref 43–77)
Neutro Abs: 3.8 10*3/uL (ref 1.7–7.7)
PLATELETS: 281 10*3/uL (ref 150–400)
RBC: 4.19 MIL/uL (ref 3.87–5.11)
RDW: 12.6 % (ref 11.5–15.5)
WBC: 6.7 10*3/uL (ref 4.0–10.5)

## 2014-06-23 LAB — POCT URINALYSIS DIP (DEVICE)
Bilirubin Urine: NEGATIVE
GLUCOSE, UA: NEGATIVE mg/dL
Ketones, ur: NEGATIVE mg/dL
Nitrite: NEGATIVE
Protein, ur: NEGATIVE mg/dL
SPECIFIC GRAVITY, URINE: 1.02 (ref 1.005–1.030)
Urobilinogen, UA: 0.2 mg/dL (ref 0.0–1.0)
pH: 5 (ref 5.0–8.0)

## 2014-06-23 LAB — URINALYSIS, ROUTINE W REFLEX MICROSCOPIC
BILIRUBIN URINE: NEGATIVE
Glucose, UA: NEGATIVE mg/dL
Ketones, ur: NEGATIVE mg/dL
NITRITE: NEGATIVE
PH: 5 (ref 5.0–8.0)
Protein, ur: NEGATIVE mg/dL
SPECIFIC GRAVITY, URINE: 1.014 (ref 1.005–1.030)
UROBILINOGEN UA: 0.2 mg/dL (ref 0.0–1.0)

## 2014-06-23 LAB — COMPREHENSIVE METABOLIC PANEL
ALBUMIN: 4.1 g/dL (ref 3.5–5.2)
ALT: 17 U/L (ref 0–35)
ANION GAP: 14 (ref 5–15)
AST: 18 U/L (ref 0–37)
Alkaline Phosphatase: 94 U/L (ref 39–117)
BUN: 17 mg/dL (ref 6–23)
CALCIUM: 9.9 mg/dL (ref 8.4–10.5)
CO2: 24 mEq/L (ref 19–32)
Chloride: 105 mEq/L (ref 96–112)
Creatinine, Ser: 1 mg/dL (ref 0.50–1.10)
GFR calc non Af Amer: 57 mL/min — ABNORMAL LOW (ref >90)
GFR, EST AFRICAN AMERICAN: 67 mL/min — AB (ref >90)
Glucose, Bld: 94 mg/dL (ref 70–99)
POTASSIUM: 4.1 meq/L (ref 3.7–5.3)
SODIUM: 143 meq/L (ref 137–147)
Total Bilirubin: 0.3 mg/dL (ref 0.3–1.2)
Total Protein: 8 g/dL (ref 6.0–8.3)

## 2014-06-23 LAB — URINE MICROSCOPIC-ADD ON

## 2014-06-23 LAB — LIPASE, BLOOD: LIPASE: 50 U/L (ref 11–59)

## 2014-06-23 MED ORDER — VALACYCLOVIR HCL 1 G PO TABS: 1000.0000 mg | ORAL_TABLET | Freq: Three times a day (TID) | ORAL | Status: AC

## 2014-06-23 MED ORDER — HYDROCODONE-ACETAMINOPHEN 5-325 MG PO TABS: 1.0000 | ORAL_TABLET | ORAL | Status: DC | PRN

## 2014-06-23 NOTE — Discharge Instructions (Signed)
Abdominal Pain, Women °Abdominal (stomach, pelvic, or belly) pain can be caused by many things. It is important to tell your doctor: °· The location of the pain. °· Does it come and go or is it present all the time? °· Are there things that start the pain (eating certain foods, exercise)? °· Are there other symptoms associated with the pain (fever, nausea, vomiting, diarrhea)? °All of this is helpful to know when trying to find the cause of the pain. °CAUSES  °· Stomach: virus or bacteria infection, or ulcer. °· Intestine: appendicitis (inflamed appendix), regional ileitis (Crohn's disease), ulcerative colitis (inflamed colon), irritable bowel syndrome, diverticulitis (inflamed diverticulum of the colon), or cancer of the stomach or intestine. °· Gallbladder disease or stones in the gallbladder. °· Kidney disease, kidney stones, or infection. °· Pancreas infection or cancer. °· Fibromyalgia (pain disorder). °· Diseases of the female organs: °· Uterus: fibroid (non-cancerous) tumors or infection. °· Fallopian tubes: infection or tubal pregnancy. °· Ovary: cysts or tumors. °· Pelvic adhesions (scar tissue). °· Endometriosis (uterus lining tissue growing in the pelvis and on the pelvic organs). °· Pelvic congestion syndrome (female organs filling up with blood just before the menstrual period). °· Pain with the menstrual period. °· Pain with ovulation (producing an egg). °· Pain with an IUD (intrauterine device, birth control) in the uterus. °· Cancer of the female organs. °· Functional pain (pain not caused by a disease, may improve without treatment). °· Psychological pain. °· Depression. °DIAGNOSIS  °Your doctor will decide the seriousness of your pain by doing an examination. °· Blood tests. °· X-rays. °· Ultrasound. °· CT scan (computed tomography, special type of X-ray). °· MRI (magnetic resonance imaging). °· Cultures, for infection. °· Barium enema (dye inserted in the large intestine, to better view it with  X-rays). °· Colonoscopy (looking in intestine with a lighted tube). °· Laparoscopy (minor surgery, looking in abdomen with a lighted tube). °· Major abdominal exploratory surgery (looking in abdomen with a large incision). °TREATMENT  °The treatment will depend on the cause of the pain.  °· Many cases can be observed and treated at home. °· Over-the-counter medicines recommended by your caregiver. °· Prescription medicine. °· Antibiotics, for infection. °· Birth control pills, for painful periods or for ovulation pain. °· Hormone treatment, for endometriosis. °· Nerve blocking injections. °· Physical therapy. °· Antidepressants. °· Counseling with a psychologist or psychiatrist. °· Minor or major surgery. °HOME CARE INSTRUCTIONS  °· Do not take laxatives, unless directed by your caregiver. °· Take over-the-counter pain medicine only if ordered by your caregiver. Do not take aspirin because it can cause an upset stomach or bleeding. °· Try a clear liquid diet (broth or water) as ordered by your caregiver. Slowly move to a bland diet, as tolerated, if the pain is related to the stomach or intestine. °· Have a thermometer and take your temperature several times a day, and record it. °· Bed rest and sleep, if it helps the pain. °· Avoid sexual intercourse, if it causes pain. °· Avoid stressful situations. °· Keep your follow-up appointments and tests, as your caregiver orders. °· If the pain does not go away with medicine or surgery, you may try: °· Acupuncture. °· Relaxation exercises (yoga, meditation). °· Group therapy. °· Counseling. °SEEK MEDICAL CARE IF:  °· You notice certain foods cause stomach pain. °· Your home care treatment is not helping your pain. °· You need stronger pain medicine. °· You want your IUD removed. °· You feel faint or   lightheaded.  You develop nausea and vomiting.  You develop a rash.  You are having side effects or an allergy to your medicine. SEEK IMMEDIATE MEDICAL CARE IF:   Your  pain does not go away or gets worse.  You have a fever.  Your pain is felt only in portions of the abdomen. The right side could possibly be appendicitis. The left lower portion of the abdomen could be colitis or diverticulitis.  You are passing blood in your stools (bright red or black tarry stools, with or without vomiting).  You have blood in your urine.  You develop chills, with or without a fever.  You pass out. MAKE SURE YOU:   Understand these instructions.  Will watch your condition.  Will get help right away if you are not doing well or get worse. Document Released: 05/18/2007 Document Revised: 12/05/2013 Document Reviewed: 06/07/2009 [facility] Patient Information 2015 [place], [place]. This information is not intended to replace advice given to you by your health care provider. Make sure you discuss any questions you have with your health care provider.  Shingles Shingles (herpes zoster) is an infection that is caused by the same virus that causes chickenpox (varicella). The infection causes a painful skin rash and fluid-filled blisters, which eventually break open, crust over, and heal. It may occur in any area of the body, but it usually affects only one side of the body or face. The pain of shingles usually lasts about 1 month. However, some people with shingles may develop long-term (chronic) pain in the affected area of the body. Shingles often occurs many years after the person had chickenpox. It is more common:  In people older than 50 years.  In people with weakened immune systems, such as those with HIV, AIDS, or cancer.  In people taking medicines that weaken the immune system, such as transplant medicines.  In people under great stress. CAUSES  Shingles is caused by the varicella zoster virus (VZV), which also causes chickenpox. After a person is infected with the virus, it can remain in the person's body for years in an inactive state (dormant). To cause  shingles, the virus reactivates and breaks out as an infection in a nerve root. The virus can be spread from person to person (contagious) through contact with open blisters of the shingles rash. It will only spread to people who have not had chickenpox. When these people are exposed to the virus, they may develop chickenpox. They will not develop shingles. Once the blisters scab over, the person is no longer contagious and cannot spread the virus to others. SIGNS AND SYMPTOMS  Shingles shows up in stages. The initial symptoms may be pain, itching, and tingling in an area of the skin. This pain is usually described as burning, stabbing, or throbbing.In a few days or weeks, a painful red rash will appear in the area where the pain, itching, and tingling were felt. The rash is usually on one side of the body in a band or belt-like pattern. Then, the rash usually turns into fluid-filled blisters. They will scab over and dry up in approximately 2-3 weeks. Flu-like symptoms may also occur with the initial symptoms, the rash, or the blisters. These may include:  Fever.  Chills.  Headache.  Upset stomach. DIAGNOSIS  Your health care provider will perform a skin exam to diagnose shingles. Skin scrapings or fluid samples may also be taken from the blisters. This sample will be examined under a microscope or sent to a  lab for further testing. TREATMENT  There is no specific cure for shingles. Your health care provider will likely prescribe medicines to help you manage the pain, recover faster, and avoid long-term problems. This may include antiviral drugs, anti-inflammatory drugs, and pain medicines. HOME CARE INSTRUCTIONS   Take a cool bath or apply cool compresses to the area of the rash or blisters as directed. This may help with the pain and itching.   Take medicines only as directed by your health care provider.   Rest as directed by your health care provider.  Keep your rash and blisters  clean with mild soap and cool water or as directed by your health care provider.  Do not pick your blisters or scratch your rash. Apply an anti-itch cream or numbing creams to the affected area as directed by your health care provider.  Keep your shingles rash covered with a loose bandage (dressing).  Avoid skin contact with:  Babies.   Pregnant women.   Children with eczema.   Elderly people with transplants.   People with chronic illnesses, such as leukemia or AIDS.   Wear loose-fitting clothing to help ease the pain of material rubbing against the rash.  Keep all follow-up visits as directed by your health care provider.If the area involved is on your face, you may receive a referral for a specialist, such as an eye doctor (ophthalmologist) or an ear, nose, and throat (ENT) doctor. Keeping all follow-up visits will help you avoid eye problems, chronic pain, or disability.  SEEK IMMEDIATE MEDICAL CARE IF:   You have facial pain, pain around the eye area, or loss of feeling on one side of your face.  You have ear pain or ringing in your ear.  You have loss of taste.  Your pain is not relieved with prescribed medicines.   Your redness or swelling spreads.   You have more pain and swelling.  Your condition is worsening or has changed.   You have a fever. MAKE SURE YOU:  Understand these instructions.  Will watch your condition.  Will get help right away if you are not doing well or get worse. Document Released: 07/21/2005 Document Revised: 12/05/2013 Document Reviewed: 03/04/2012 [facility] Patient Information 2015 [place], [place]. This information is not intended to replace advice given to you by your health care provider. Make sure you discuss any questions you have with your health care provider.  Tension Headache A tension headache is a feeling of pain, pressure, or aching often felt over the front and sides of the head. The pain can be dull or can feel  tight (constricting). It is the most common type of headache. Tension headaches are not normally associated with nausea or vomiting and do not get worse with physical activity. Tension headaches can last 30 minutes to several days.  CAUSES  The exact cause is not known, but it may be caused by chemicals and hormones in the brain that lead to pain. Tension headaches often begin after stress, anxiety, or depression. Other triggers may include:  Alcohol.  Caffeine (too much or withdrawal).  Respiratory infections (colds, flu, sinus infections).  Dental problems or teeth clenching.  Fatigue.  Holding your head and neck in one position too long while using a computer. SYMPTOMS   Pressure around the head.   Dull, aching head pain.   Pain felt over the front and sides of the head.   Tenderness in the muscles of the head, neck, and shoulders. DIAGNOSIS  A tension  headache is often diagnosed based on:   Symptoms.   Physical examination.   A CT scan or MRI of your head. These tests may be ordered if symptoms are severe or unusual. TREATMENT  Medicines may be given to help relieve symptoms.  HOME CARE INSTRUCTIONS   Only take over-the-counter or prescription medicines for pain or discomfort as directed by your caregiver.   Lie down in a dark, quiet room when you have a headache.   Keep a journal to find out what may be triggering your headaches. For example, write down:  What you eat and drink.  How much sleep you get.  Any change to your diet or medicines.  Try massage or other relaxation techniques.   Ice packs or heat applied to the head and neck can be used. Use these 3 to 4 times per day for 15 to 20 minutes each time, or as needed.   Limit stress.   Sit up straight, and do not tense your muscles.   Quit smoking if you smoke.  Limit alcohol use.  Decrease the amount of caffeine you drink, or stop drinking caffeine.  Eat and exercise regularly.  Get  7 to 9 hours of sleep, or as recommended by your caregiver.  Avoid excessive use of pain medicine as recurrent headaches can occur.  SEEK MEDICAL CARE IF:   You have problems with the medicines you were prescribed.  Your medicines do not work.  You have a change from the usual headache.  You have nausea or vomiting. SEEK IMMEDIATE MEDICAL CARE IF:   Your headache becomes severe.  You have a fever.  You have a stiff neck.  You have loss of vision.  You have muscular weakness or loss of muscle control.  You lose your balance or have trouble walking.  You feel faint or pass out.  You have severe symptoms that are different from your first symptoms. MAKE SURE YOU:   Understand these instructions.  Will watch your condition.  Will get help right away if you are not doing well or get worse. Document Released: 07/21/2005 Document Revised: 10/13/2011 Document Reviewed: 07/11/2011 [facility] Patient Information 2015 [place], [place]. This information is not intended to replace advice given to you by your health care provider. Make sure you discuss any questions you have with your health care provider.

## 2014-06-23 NOTE — Telephone Encounter (Signed)
Wanda [name] with [facility] said pt had RUOQ abd pain for 1 week; radiates to back;pain level now 7-8 and at times has sharp pain that does not last; pt has difficulty focusing and h/a. [facility] has ED disposition; Dr Darnell Level said OK for pt to go to [facility] for evaluation; Jenny Reichmann voiced understanding and will notify pt.

## 2014-06-23 NOTE — ED Provider Notes (Signed)
CSN: [id number]     Arrival date & time 06/23/14  1332 History   First MD Initiated Contact with Patient 06/23/14 1446     Chief Complaint  Patient presents with  . Chest Pain    radiates down her abdomen, and around her right side.    (Consider location/radiation/quality/duration/timing/severity/associated sxs/prior Treatment) HPI          68 year old female presents for evaluation of headache, abdominal pain, radiating around to her back. This initially started yesterday. She describes it as a dull ache as well as intermittent stinging, stabbing sensation. It is occasionally 10 of 10 in severity and then will go back down to 6 out of 10 in severity. She has never had this before. No history of any stomach surgeries or kidney stones. She has a strong family history of cholelithiasis and cholecystitis. No fever, chills, NVD, chest pain, shortness of breath. She has a history of left facial varicella-zoster.  Has HA for 1 week, on and off, as well/     Past Medical History  Diagnosis Date  . DM (diabetes mellitus)   . Hyperlipidemia   . HTN (hypertension)   . Colon polyps    Past Surgical History  Procedure Laterality Date  . Wrist surgery  1969   Family History  Problem Relation Age of Onset  . Heart attack    . Hypertension Mother   . Hyperlipidemia Mother   . Stroke Mother    History  Substance Use Topics  . Smoking status: Never Smoker   . Smokeless tobacco: Never Used  . Alcohol Use: No   OB History    No data available     Review of Systems  Constitutional: Negative for fever and chills.  Gastrointestinal: Positive for abdominal pain and constipation (chronic). Negative for nausea, vomiting and diarrhea.  Musculoskeletal: Positive for back pain.  Neurological: Positive for headaches.    Allergies  Niacin  Home Medications   Prior to Admission medications   Medication Sig Start Date End Date Taking? Authorizing Provider  aspirin 81 MG tablet Take 81 mg by  mouth daily.   Yes Historical Provider, MD  busPIRone (BUSPAR) 15 MG tablet TAKE 1/2 TABLET BY MOUTH 2 TIMES DAILY. 05/26/14  Yes Lucille Passy, MD  glipiZIDE (GLUCOTROL XL) 5 MG 24 hr tablet TAKE 1 TABLET BY MOUTH ONCE A DAY 01/25/14  Yes Lucille Passy, MD  glucose blood (FREESTYLE LITE) test strip Use as instructed 03/09/13  Yes Lucille Passy, MD  JANUVIA 100 MG tablet TAKE 1 TABLET BY MOUTH ONCE DAILY. 02/08/14  Yes Lucille Passy, MD  lisinopril (PRINIVIL,ZESTRIL) 10 MG tablet TAKE 1 TABLET BY MOUTH ONCE A DAY. 10/06/13  Yes Lucille Passy, MD  metFORMIN (GLUCOPHAGE-XR) 500 MG 24 hr tablet TAKE 2 TABLETS BY MOUTH EVERY MORNING WITH BREAKFAST. 03/15/14  Yes Lucille Passy, MD  omeprazole (PRILOSEC) 20 MG capsule Take 20 mg by mouth daily as needed.    Yes Historical Provider, MD  pravastatin (PRAVACHOL) 10 MG tablet Take 1 tablet (10 mg total) by mouth daily. 05/03/14  Yes Lucille Passy, MD  sodium polystyrene (KAYEXALATE) 15 GM/60ML suspension Take 60 mLs (15 g total) by mouth once. 01/13/14  Yes Lucille Passy, MD  HYDROcodone-acetaminophen (NORCO) 5-325 MG per tablet Take 1 tablet by mouth every 4 (four) hours as needed for moderate pain. 06/23/14   Liam Graham, PA-C  valACYclovir (VALTREX) 1000 MG tablet Take 1 tablet (1,000 mg total)  by mouth 3 (three) times daily. 06/23/14 07/07/14  Freeman Caldron Anthonella Klausner, PA-C   BP 162/77 mmHg  Pulse 88  Temp(Src) 97.5 F (36.4 C) (Oral)  Resp 18  SpO2 100% Physical Exam  Constitutional: She is oriented to person, place, and time. Vital signs are normal. She appears well-developed and well-nourished. No distress.  HENT:  Head: Normocephalic and atraumatic.  Cardiovascular: Normal rate, regular rhythm, normal heart sounds and intact distal pulses.   Pulmonary/Chest: Effort normal and breath sounds normal. No respiratory distress. She has no wheezes. She has no rales.  Abdominal: Soft. Normal appearance. She exhibits no distension and no mass. There is no hepatosplenomegaly.  There is tenderness in the right upper quadrant and epigastric area. There is no rigidity, no rebound, no guarding, no CVA tenderness, no tenderness at McBurney's point and negative Murphy's sign. No hernia.  Neurological: She is alert and oriented to person, place, and time. She has normal strength. Coordination normal.  Skin: Skin is warm and dry. Rash noted. Rash is papular. She is not diaphoretic.     Psychiatric: She has a normal mood and affect. Judgment normal.  Nursing note and vitals reviewed.   ED Course  Procedures (including critical care time) Labs Review Labs Reviewed  COMPREHENSIVE METABOLIC PANEL - Abnormal; Notable for the following:    GFR calc non Af Amer 57 (*)    GFR calc Af Amer 67 (*)    All other components within normal limits  URINALYSIS, ROUTINE W REFLEX MICROSCOPIC - Abnormal; Notable for the following:    Hgb urine dipstick TRACE (*)    Leukocytes, UA SMALL (*)    All other components within normal limits  URINE MICROSCOPIC-ADD ON - Abnormal; Notable for the following:    Squamous Epithelial / LPF FEW (*)    Bacteria, UA FEW (*)    All other components within normal limits  POCT URINALYSIS DIP (DEVICE) - Abnormal; Notable for the following:    Hgb urine dipstick TRACE (*)    Leukocytes, UA TRACE (*)    All other components within normal limits  CBC WITH DIFFERENTIAL  LIPASE, BLOOD    Imaging Review No results found.   MDM   1. Varicella zoster   2. Pain of upper abdomen   3. Tension headache     labs are all within normal limits, no leukocytosis. Liver enzymes are not elevated. Lipase is normal. Urine microscopy was normal. Her symptoms could all be explained by early outbreak of shingles, especially with the small confluence of erythematous papules on the back that is in the nerve distribution where she is having the abdominal pain and back pain. The case was discussed with the attending, Dr. Georgina Snell, who is in agreement. Treat with Valtrex and  analgesics. Emergency department if worsening  Meds ordered this encounter  Medications  . valACYclovir (VALTREX) 1000 MG tablet    Sig: Take 1 tablet (1,000 mg total) by mouth 3 (three) times daily.    Dispense:  21 tablet    Refill:  0    Order Specific Question:  Supervising Provider    Answer:  Billy Fischer [phone]  . HYDROcodone-acetaminophen (NORCO) 5-325 MG per tablet    Sig: Take 1 tablet by mouth every 4 (four) hours as needed for moderate pain.    Dispense:  20 tablet    Refill:  0    Order Specific Question:  Supervising Provider    Answer:  Ihor Gully D [phone]  Liam Graham, PA-C 06/23/14 1720

## 2014-06-23 NOTE — ED Notes (Signed)
Pt states she has been very sluggish for a while, along with a headache.  She complains of lower chest pain that radiates down her abdomen and around her right side to her back.  She also says she gets pains "that feel like i am being stabbed with a comb/pick."

## 2014-06-23 NOTE — Telephone Encounter (Signed)
agree

## 2014-06-23 NOTE — Telephone Encounter (Signed)
Patient Information:  Caller Name: Oretha  Phone: [phone]  Patient: Wanda [name], Heming  Gender: Female  DOB: Dec 15, 1945  Age: 68 Years  PCP: Arnette Norris [facility])  Office Follow Up:  Does the office need to follow up with this patient?: No  Instructions For The Office: N/A  RN Note:  Called office and spoke to nurse At[place] who consulted with Dr.Gutierrez--> he advised ok to send to UC.  Symptoms  Reason For Call & Symptoms: Calling about constant upper right side abd pain radiating around to back getting worse--not able to perform daily activities, sluggish, headache, trouble sleeping and difficulty focusing. Pain rated "7-8". Has had abd tenderness x 1 week but getting worse. Pt sent a message via MyChart to Dr.Aron this am and stated that she was going to stop Pravastatin--> Dr.Aron advised pt to see her sooner if sxs worsen or go to UC if over the weekend. Was started on Pravastatin on 05/03/14. Has an appt with PCP on 07/03/14.  Reviewed Health History In EMR: Yes  Reviewed Medications In EMR: Yes  Reviewed Allergies In EMR: Yes  Reviewed Surgeries / Procedures: Yes  Date of Onset of Symptoms: 06/22/2014  Guideline(s) Used:  Abdominal Pain - Female  Abdominal Pain - Upper  Disposition Per Guideline:   Go to ED Now  Reason For Disposition Reached:   Pain lasting > 10 minutes and over 12 years old  Advice Given:  N/A  Patient Will Follow Care Advice:  YES

## 2014-07-03 ENCOUNTER — Ambulatory Visit (INDEPENDENT_AMBULATORY_CARE_PROVIDER_SITE_OTHER)
Admission: RE | Admit: 2014-07-03 | Discharge: 2014-07-03 | Disposition: A | Discharge status: Home / Self Care | Payer: Medicare Other | Source: Ambulatory Visit | Attending: Family Medicine | Admitting: Family Medicine

## 2014-07-03 ENCOUNTER — Ambulatory Visit (INDEPENDENT_AMBULATORY_CARE_PROVIDER_SITE_OTHER): Payer: Medicare Other | Admitting: Family Medicine

## 2014-07-03 ENCOUNTER — Encounter: Payer: Self-pay | Admitting: Family Medicine

## 2014-07-03 VITALS — BP 124/66 | HR 72 | Temp 97.9°F | Wt 142.8 lb

## 2014-07-03 DIAGNOSIS — W19XXXA Unspecified fall, initial encounter: Secondary | ICD-10-CM

## 2014-07-03 DIAGNOSIS — N183 Chronic kidney disease, stage 3 unspecified: Secondary | ICD-10-CM

## 2014-07-03 DIAGNOSIS — Y92009 Unspecified place in unspecified non-institutional (private) residence as the place of occurrence of the external cause: Secondary | ICD-10-CM

## 2014-07-03 DIAGNOSIS — E1121 Type 2 diabetes mellitus with diabetic nephropathy: Secondary | ICD-10-CM

## 2014-07-03 DIAGNOSIS — M25532 Pain in left wrist: Secondary | ICD-10-CM

## 2014-07-03 DIAGNOSIS — Y92099 Unspecified place in other non-institutional residence as the place of occurrence of the external cause: Secondary | ICD-10-CM

## 2014-07-03 DIAGNOSIS — I10 Essential (primary) hypertension: Secondary | ICD-10-CM

## 2014-07-03 DIAGNOSIS — E785 Hyperlipidemia, unspecified: Secondary | ICD-10-CM

## 2014-07-03 LAB — COMPREHENSIVE METABOLIC PANEL
ALBUMIN: 4 g/dL (ref 3.5–5.2)
ALT: 25 U/L (ref 0–35)
AST: 28 U/L (ref 0–37)
Alkaline Phosphatase: 78 U/L (ref 39–117)
BUN: 20 mg/dL (ref 6–23)
CO2: 23 mEq/L (ref 19–32)
Calcium: 9.2 mg/dL (ref 8.4–10.5)
Chloride: 107 mEq/L (ref 96–112)
Creatinine, Ser: 1.2 mg/dL (ref 0.4–1.2)
GFR: 46.34 mL/min — ABNORMAL LOW (ref >60.00)
GLUCOSE: 195 mg/dL — AB (ref 70–99)
POTASSIUM: 4.2 meq/L (ref 3.5–5.1)
Sodium: 139 mEq/L (ref 135–145)
TOTAL PROTEIN: 7.4 g/dL (ref 6.0–8.3)
Total Bilirubin: 0.6 mg/dL (ref 0.2–1.2)

## 2014-07-03 LAB — LIPID PANEL
CHOL/HDL RATIO: 6
Cholesterol: 153 mg/dL (ref 0–200)
HDL: 25.8 mg/dL — AB (ref >39.00)
NONHDL: 127.2
Triglycerides: 287 mg/dL — ABNORMAL HIGH (ref 0.0–149.0)
VLDL: 57.4 mg/dL — ABNORMAL HIGH (ref 0.0–40.0)

## 2014-07-03 LAB — LDL CHOLESTEROL, DIRECT: LDL DIRECT: 81.6 mg/dL

## 2014-07-03 NOTE — Progress Notes (Signed)
Subjective:   Patient ID: Wanda [name], female    DOB: 1946-08-01, 68 y.o.   MRN: [id number]  Wanda [name] is a pleasant 68 y.o. year old female who presents to clinic today with Follow-up and Herpes Zoster  on 07/03/2014  HPI:  Had shingles on 11/20- was seen at Urgent Care. Given valtrex, pain is improving.  Last Wednesday, slipped on wet floor landed on out stretched hand.  Now lateral wrist is painful.  Was swollen but that has resolved.  Feels grip strength is weak.  DM II- diagnosed 10 years ago.  Urine micro neg 08/10/2013.  Lab Results  Component Value Date   HGBA1C 7.0* 04/26/2014    On Metformin 1500 mg daily, Januvia 100 mg daily, Glipizide 5 mg daily. Admits to not checking CBGs daily. Denies any episodes of hypoglycemia.  HLD- LDL was not at goal.  On Pravachol (started in September)  Lab Results  Component Value Date   CHOL 199 04/26/2014   HDL 31.00* 04/26/2014   LDLCALC 118* 01/13/2014   LDLDIRECT 106.9 04/26/2014   TRIG 340.0* 04/26/2014   CHOLHDL 6 04/26/2014    Wt Readings from Last 3 Encounters:  07/03/14 142 lb 12 oz (64.751 kg)  05/03/14 145 lb 12 oz (66.112 kg)  01/25/14 143 lb (64.864 kg)     CKD III- due to diabetes, HTN- has seen Dr. Candiss Norse. Lab Results  Component Value Date   CREATININE 1.00 06/23/2014     Lab Results  Component Value Date   NA 143 06/23/2014   K 4.1 06/23/2014   CL 105 06/23/2014   CO2 24 06/23/2014    HTN- at goal for diabetic. On Lisinopril 10 mg daily. No CP, SOB, blurred vision or headaches.   Current Outpatient Prescriptions on File Prior to Visit  Medication Sig Dispense Refill  . aspirin 81 MG tablet Take 81 mg by mouth daily.    . busPIRone (BUSPAR) 15 MG tablet TAKE 1/2 TABLET BY MOUTH 2 TIMES DAILY. 60 tablet 2  . glipiZIDE (GLUCOTROL XL) 5 MG 24 hr tablet TAKE 1 TABLET BY MOUTH ONCE A DAY 90 tablet 1  . glucose blood (FREESTYLE LITE) test strip Use as instructed 50 each 5  .  HYDROcodone-acetaminophen (NORCO) 5-325 MG per tablet Take 1 tablet by mouth every 4 (four) hours as needed for moderate pain. 20 tablet 0  . JANUVIA 100 MG tablet TAKE 1 TABLET BY MOUTH ONCE DAILY. 90 tablet 1  . lisinopril (PRINIVIL,ZESTRIL) 10 MG tablet TAKE 1 TABLET BY MOUTH ONCE A DAY. 90 tablet 1  . metFORMIN (GLUCOPHAGE-XR) 500 MG 24 hr tablet TAKE 2 TABLETS BY MOUTH EVERY MORNING WITH BREAKFAST. 60 tablet 3  . omeprazole (PRILOSEC) 20 MG capsule Take 20 mg by mouth daily as needed.     . pravastatin (PRAVACHOL) 10 MG tablet Take 1 tablet (10 mg total) by mouth daily. 30 tablet 3  . sodium polystyrene (KAYEXALATE) 15 GM/60ML suspension Take 60 mLs (15 g total) by mouth once. 500 mL 0  . valACYclovir (VALTREX) 1000 MG tablet Take 1 tablet (1,000 mg total) by mouth 3 (three) times daily. 21 tablet 0   No current facility-administered medications on file prior to visit.    Allergies  Allergen Reactions  . Niacin     REACTION: unbearable hot flashes    Past Medical History  Diagnosis Date  . DM (diabetes mellitus)   . Hyperlipidemia   . HTN (hypertension)   . Colon polyps  Past Surgical History  Procedure Laterality Date  . Wrist surgery  1969    Family History  Problem Relation Age of Onset  . Heart attack    . Hypertension Mother   . Hyperlipidemia Mother   . Stroke Mother     History   Social History  . Marital Status: Married    Spouse Name: N/A    Number of Children: N/A  . Years of Education: N/A   Occupational History  . Not on file.   Social History Main Topics  . Smoking status: Never Smoker   . Smokeless tobacco: Never Used  . Alcohol Use: No  . Drug Use: No  . Sexual Activity: Not on file   Other Topics Concern  . Not on file   Social History Narrative   The PMH, PSH, Social History, Family History, Medications, and allergies have been reviewed in [facility], and have been updated if relevant.  Review of Systems See HPI No CP No SOB No  DOE No LE edema No muscle aches No HA No SI or HI     Objective:    BP 124/66 mmHg  Pulse 72  Temp(Src) 97.9 F (36.6 C) (Oral)  Wt 142 lb 12 oz (64.751 kg)  SpO2 98%   Physical Exam  Constitutional: She appears well-developed and well-nourished. No distress.  Musculoskeletal:       Left wrist: She exhibits decreased range of motion and bony tenderness. She exhibits no swelling, no effusion, no crepitus, no deformity and no laceration.  TTP over lateral wrist, no tenderness over snuff box  Skin: Skin is warm and dry. No rash noted.  Psychiatric: She has a normal mood and affect. Her behavior is normal. Judgment and thought content normal.  Nursing note and vitals reviewed.         Assessment & Plan:   Essential hypertension  Type 2 diabetes mellitus with diabetic nephropathy  CKD (chronic kidney disease) stage 3, GFR 30-59 ml/min No Follow-up on file.

## 2014-07-03 NOTE — Progress Notes (Signed)
Pre visit review using our clinic review tool, if applicable. No additional management support is needed unless otherwise documented below in the visit note. 

## 2014-07-03 NOTE — Assessment & Plan Note (Signed)
New- recheck labs today since she has not had labs drawn since she started Pravachol.

## 2014-07-03 NOTE — Assessment & Plan Note (Signed)
Well controlled- at goal for diabetic. No changes made today. 

## 2014-07-03 NOTE — Assessment & Plan Note (Signed)
No TTP over snuff box. Will still xray today given TTP and decreased strength.

## 2014-07-03 NOTE — Patient Instructions (Signed)
Great to see you. I will call you with your results from today. 

## 2014-07-03 NOTE — Assessment & Plan Note (Signed)
Now with wrist pain. See below.

## 2014-07-04 ENCOUNTER — Encounter: Payer: Self-pay | Admitting: Family Medicine

## 2014-07-04 LAB — HM DIABETES EYE EXAM

## 2014-07-10 ENCOUNTER — Encounter: Payer: Self-pay | Admitting: Family Medicine

## 2014-07-10 ENCOUNTER — Ambulatory Visit (INDEPENDENT_AMBULATORY_CARE_PROVIDER_SITE_OTHER): Payer: Medicare Other | Admitting: Family Medicine

## 2014-07-10 ENCOUNTER — Ambulatory Visit (INDEPENDENT_AMBULATORY_CARE_PROVIDER_SITE_OTHER)
Admission: RE | Admit: 2014-07-10 | Discharge: 2014-07-10 | Disposition: A | Discharge status: Home / Self Care | Payer: Medicare Other | Source: Ambulatory Visit | Attending: Family Medicine | Admitting: Family Medicine

## 2014-07-10 VITALS — BP 120/64 | HR 78 | Temp 98.1°F | Ht 65.0 in | Wt 144.2 lb

## 2014-07-10 DIAGNOSIS — M25532 Pain in left wrist: Secondary | ICD-10-CM

## 2014-07-10 NOTE — Progress Notes (Signed)
Dr. Frederico Hamman T. Wandy Bossler, MD, [facility] [place], [address] Phone: [phone] Fax: [phone]  07/10/2014  Patient: Wanda [name], MRN: [id number], DOB: Sep 29, 1945, 68 y.o.  Primary Physician:  Arnette Norris, MD  Chief Complaint: Follow-up  Subjective:   Wanda [name] is a 68 y.o. very pleasant female patient who presents with the following:  Dr. Deborra Medina asked me to evaluate.  Fell face first, and under the body, for the most part the sharp pains have gone away.   The patient initially fell approximately 2 weeks ago, and then she came to see my partner approximately 9 days ago, and she was ultimately placed in a thumb spica splint.  At the time of that evaluation, radiology had some concern for potential scaphoid fracture and recommended follow-up films versus potential advanced imaging.  Today, the patient states she is still feeling some pain in her wrist as well as in her distal forearm, but it is notably better and her swelling and pain is better  Past Medical History, Surgical History, Social History, Family History, Problem List, Medications, and Allergies have been reviewed and updated if relevant.  GEN: No fevers, chills. Nontoxic. Primarily MSK c/o today. MSK: Detailed in the HPI GI: tolerating PO intake without difficulty Neuro: No numbness, parasthesias, or tingling associated. Otherwise the pertinent positives of the ROS are noted above.   Objective:   BP 120/64 mmHg  Pulse 78  Temp(Src) 98.1 F (36.7 C) (Oral)  Ht 5\' 5"  (1.651 m)  Wt 144 lb 4 oz (65.431 kg)  BMI 24.00 kg/m2   GEN: WDWN, NAD, Non-toxic, Alert & Oriented x 3 HEENT: Atraumatic, Normocephalic.  Ears and Nose: No external deformity. EXTR: No clubbing/cyanosis/edema NEURO: Normal gait.  PSYCH: Normally interactive. Conversant. Not depressed or anxious appearing.  Calm demeanor.    LEFT wrist: Nontender throughout all fingers.  All MCP,  PIP, and DIP joints are all nontender.  Nontender throughout all metacarpals.  Minimal swelling in the carpal region.  Mild tenderness at the scaphoid and through much of the carpal region.  Mild to minimal tenderness along the distal radius and ulna.  Grip is intact.  Neurovascularly intact.  Radiology: Dg Wrist Complete Left  07/11/2014   CLINICAL DATA:  Followup potential scaphoid fracture  EXAM: LEFT WRIST - COMPLETE 3+ VIEW  COMPARISON:  07/03/2014  FINDINGS: Osseous mineralization normal for technique.  Joint spaces preserved.  No acute fracture, dislocation or bone destruction.  Specifically no scaphoid fracture identified.  IMPRESSION: Normal exam.   Electronically Signed   By: Lavonia Dana M.D.   On: 07/11/2014 02:24   Dg Wrist Complete Left  07/03/2014   CLINICAL DATA:  Left wrist pain after a fall.Fall at home, initial encounter W19.XXXA, Y92.099 (ICD-10-CM).  EXAM: LEFT WRIST - COMPLETE 3+ VIEW  COMPARISON:  None.  FINDINGS: Subtle lucency through the distal scaphoid on the initial image. Not confirmed on other views. No other fracture identified. Mild radiocarpal joint space narrowing.  IMPRESSION: Possible nondisplaced distal scaphoid fracture, only apparent on one view. Correlate with point tenderness. Depending on clinical concern, consider further evaluation with MRI or plain film followup in 7 days.   Electronically Signed   By: Abigail Miyamoto M.D.   On: 07/03/2014 11:47     Assessment and Plan:   Wrist pain, left - Plan: DG Wrist Complete Left  I reviewed the patient's films myself, and do not see any occult fracture.  I  spoke to bone radiology, and they also agreed that the patient does not have any evidence of occult scaphoid fracture.  Titrate all of her thumb spica splint over the next 10-14 days.  Advance activity as tolerated.  If she is still having significant problems in 3 or 4 weeks, follow-up with me and we can potentially do a wrist magnetic resonance imaging if still  significantly symptomatic.  New Prescriptions   No medications on file   Orders Placed This Encounter  Procedures  . DG Wrist Complete Left    Signed,  Calahan Pak T. Shyhiem Beeney, MD   Patient's Medications  New Prescriptions   No medications on file  Previous Medications   ASPIRIN 81 MG TABLET    Take 81 mg by mouth daily.   BUSPIRONE (BUSPAR) 15 MG TABLET    TAKE 1/2 TABLET BY MOUTH 2 TIMES DAILY.   GLIPIZIDE (GLUCOTROL XL) 5 MG 24 HR TABLET    TAKE 1 TABLET BY MOUTH ONCE A DAY   GLUCOSE BLOOD (FREESTYLE LITE) TEST STRIP    Use as instructed   HYDROCODONE-ACETAMINOPHEN (NORCO) 5-325 MG PER TABLET    Take 1 tablet by mouth every 4 (four) hours as needed for moderate pain.   JANUVIA 100 MG TABLET    TAKE 1 TABLET BY MOUTH ONCE DAILY.   LISINOPRIL (PRINIVIL,ZESTRIL) 10 MG TABLET    TAKE 1 TABLET BY MOUTH ONCE A DAY.   METFORMIN (GLUCOPHAGE-XR) 500 MG 24 HR TABLET    TAKE 2 TABLETS BY MOUTH EVERY MORNING WITH BREAKFAST.   OMEPRAZOLE (PRILOSEC) 20 MG CAPSULE    Take 20 mg by mouth daily as needed.    PRAVASTATIN (PRAVACHOL) 10 MG TABLET    Take 1 tablet (10 mg total) by mouth daily.   SODIUM POLYSTYRENE (KAYEXALATE) 15 GM/60ML SUSPENSION    Take 60 mLs (15 g total) by mouth once.  Modified Medications   No medications on file  Discontinued Medications   No medications on file

## 2014-07-10 NOTE — Progress Notes (Signed)
Pre visit review using our clinic review tool, if applicable. No additional management support is needed unless otherwise documented below in the visit note. 

## 2014-08-07 ENCOUNTER — Encounter: Payer: Self-pay | Admitting: Family Medicine

## 2014-08-23 ENCOUNTER — Other Ambulatory Visit: Payer: Self-pay | Admitting: Family Medicine

## 2014-09-08 ENCOUNTER — Other Ambulatory Visit: Payer: Self-pay | Admitting: Family Medicine

## 2014-09-08 NOTE — Telephone Encounter (Signed)
Last f/u appt 06/2014. A1C not ordered. Ok to refill?

## 2014-09-11 ENCOUNTER — Telehealth: Payer: Self-pay | Admitting: Family Medicine

## 2014-09-11 NOTE — Telephone Encounter (Signed)
Pt dropped off health examination certificate to be filled out On waynette's desk

## 2014-09-11 NOTE — Telephone Encounter (Signed)
Form placed in Dr Aron's inbox for completion  

## 2014-09-12 NOTE — Telephone Encounter (Signed)
Spoke to pt and informed her form to be mailed to her location in provided envelope; copy sent for scanning.

## 2014-09-12 NOTE — Telephone Encounter (Signed)
Form signed and in my box. 

## 2014-09-13 ENCOUNTER — Ambulatory Visit: Payer: Self-pay | Admitting: Family Medicine

## 2014-09-20 ENCOUNTER — Other Ambulatory Visit: Payer: Self-pay | Admitting: Internal Medicine

## 2014-09-20 DIAGNOSIS — E785 Hyperlipidemia, unspecified: Secondary | ICD-10-CM

## 2014-09-27 ENCOUNTER — Other Ambulatory Visit (INDEPENDENT_AMBULATORY_CARE_PROVIDER_SITE_OTHER): Payer: Medicare Other

## 2014-09-27 DIAGNOSIS — E785 Hyperlipidemia, unspecified: Secondary | ICD-10-CM

## 2014-09-27 LAB — COMPREHENSIVE METABOLIC PANEL
ALBUMIN: 4.1 g/dL (ref 3.5–5.2)
ALT: 18 U/L (ref 0–35)
AST: 19 U/L (ref 0–37)
Alkaline Phosphatase: 87 U/L (ref 39–117)
BUN: 17 mg/dL (ref 6–23)
CO2: 28 mEq/L (ref 19–32)
Calcium: 9.6 mg/dL (ref 8.4–10.5)
Chloride: 110 mEq/L (ref 96–112)
Creatinine, Ser: 1.05 mg/dL (ref 0.40–1.20)
GFR: 55.58 mL/min — ABNORMAL LOW (ref >60.00)
Glucose, Bld: 93 mg/dL (ref 70–99)
Potassium: 4.4 mEq/L (ref 3.5–5.1)
SODIUM: 142 meq/L (ref 135–145)
TOTAL PROTEIN: 7.3 g/dL (ref 6.0–8.3)
Total Bilirubin: 0.5 mg/dL (ref 0.2–1.2)

## 2014-09-27 LAB — LIPID PANEL
CHOLESTEROL: 150 mg/dL (ref 0–200)
HDL: 35 mg/dL — ABNORMAL LOW (ref >39.00)
LDL Cholesterol: 85 mg/dL (ref 0–99)
NonHDL: 115
TRIGLYCERIDES: 152 mg/dL — AB (ref 0.0–149.0)
Total CHOL/HDL Ratio: 4
VLDL: 30.4 mg/dL (ref 0.0–40.0)

## 2014-10-02 ENCOUNTER — Ambulatory Visit (INDEPENDENT_AMBULATORY_CARE_PROVIDER_SITE_OTHER): Payer: Medicare Other | Admitting: Family Medicine

## 2014-10-02 ENCOUNTER — Encounter: Payer: Self-pay | Admitting: Family Medicine

## 2014-10-02 VITALS — BP 124/76 | HR 92 | Temp 98.1°F | Wt 143.5 lb

## 2014-10-02 DIAGNOSIS — I10 Essential (primary) hypertension: Secondary | ICD-10-CM

## 2014-10-02 DIAGNOSIS — N183 Chronic kidney disease, stage 3 unspecified: Secondary | ICD-10-CM

## 2014-10-02 DIAGNOSIS — K219 Gastro-esophageal reflux disease without esophagitis: Secondary | ICD-10-CM

## 2014-10-02 DIAGNOSIS — Z23 Encounter for immunization: Secondary | ICD-10-CM

## 2014-10-02 DIAGNOSIS — E1121 Type 2 diabetes mellitus with diabetic nephropathy: Secondary | ICD-10-CM

## 2014-10-02 DIAGNOSIS — E785 Hyperlipidemia, unspecified: Secondary | ICD-10-CM

## 2014-10-02 LAB — MICROALBUMIN / CREATININE URINE RATIO
CREATININE, U: 166.6 mg/dL
Microalb Creat Ratio: 0.8 mg/g (ref 0.0–30.0)
Microalb, Ur: 1.3 mg/dL (ref 0.0–1.9)

## 2014-10-02 LAB — HEMOGLOBIN A1C: Hgb A1c MFr Bld: 7.3 % — ABNORMAL HIGH (ref 4.6–6.5)

## 2014-10-02 NOTE — Assessment & Plan Note (Signed)
Well controlled on current rx. No changes made today. 

## 2014-10-02 NOTE — Assessment & Plan Note (Addendum)
Due for a1c today. Renal function improved. prevnar 13 given today.

## 2014-10-02 NOTE — Progress Notes (Signed)
Pre visit review using our clinic review tool, if applicable. No additional management support is needed unless otherwise documented below in the visit note. 

## 2014-10-02 NOTE — Patient Instructions (Addendum)
Good to see you. We will call you with your results from today.

## 2014-10-02 NOTE — Assessment & Plan Note (Signed)
At goal for diabetic. No changes made today. 

## 2014-10-02 NOTE — Addendum Note (Signed)
Addended by: Modena Nunnery on: 10/02/2014 02:51 PM   Modules accepted: Orders

## 2014-10-02 NOTE — Progress Notes (Signed)
Subjective:   Patient ID: Wanda [name], female    DOB: [birth year], 69 y.o.   MRN: [id number]  Wanda [name] is a pleasant 69 y.o. year old female who presents to clinic today with Follow-up and Heartburn  on 10/02/2014  HPI:  DM II- diagnosed 10 years ago.  Urine micro neg 08/10/2013.  On ACEI. Pneumovax 04/25/13 Unfortunately a1c was not ordered. Renal function improved. Lab Results  Component Value Date   HGBA1C 7.0* 04/26/2014    On Metformin 1500 mg daily, Januvia 100 mg daily, Glipizide 5 mg daily. Admits to not checking CBGs daily. Denies any episodes of hypoglycemia.  HLD- LDL now at goal.  HDL much better and TG improved. Lab Results  Component Value Date   CHOL 150 09/27/2014   HDL 35.00* 09/27/2014   LDLCALC 85 09/27/2014   LDLDIRECT 81.6 07/03/2014   TRIG 152.0* 09/27/2014   CHOLHDL 4 09/27/2014    Wt Readings from Last 3 Encounters:  10/02/14 143 lb 8 oz (65.091 kg)  07/10/14 144 lb 4 oz (65.431 kg)  07/03/14 142 lb 12 oz (64.751 kg)     CKD III- due to diabetes, HTN- has seen Dr. Candiss Norse. Lab Results  Component Value Date   CREATININE 1.05 09/27/2014     Lab Results  Component Value Date   NA 142 09/27/2014   K 4.4 09/27/2014   CL 110 09/27/2014   CO2 28 09/27/2014    HTN- at goal for diabetic. On Lisinopril 10 mg daily. No CP, SOB, blurred vision or headaches.   Current Outpatient Prescriptions on File Prior to Visit  Medication Sig Dispense Refill  . aspirin 81 MG tablet Take 81 mg by mouth daily.    Marland Kitchen glipiZIDE (GLUCOTROL XL) 5 MG 24 hr tablet TAKE 1 TABLET BY MOUTH ONCE A DAY 90 tablet 1  . glucose blood (FREESTYLE LITE) test strip Use as instructed 50 each 5  . HYDROcodone-acetaminophen (NORCO) 5-325 MG per tablet Take 1 tablet by mouth every 4 (four) hours as needed for moderate pain. 20 tablet 0  . JANUVIA 100 MG tablet TAKE 1 TABLET BY MOUTH ONCE DAILY. 90 tablet 3  . lisinopril (PRINIVIL,ZESTRIL) 10 MG tablet TAKE 1 TABLET BY MOUTH  ONCE A DAY. 90 tablet 3  . metFORMIN (GLUCOPHAGE-XR) 500 MG 24 hr tablet TAKE 2 TABLETS BY MOUTH EVERY MORNING WITH BREAKFAST. 60 tablet 3  . omeprazole (PRILOSEC) 20 MG capsule Take 20 mg by mouth daily as needed.     . pravastatin (PRAVACHOL) 10 MG tablet Take 1 tablet (10 mg total) by mouth daily. 30 tablet 3  . busPIRone (BUSPAR) 15 MG tablet TAKE 1/2 TABLET BY MOUTH 2 TIMES DAILY. (Patient not taking: Reported on 10/02/2014) 60 tablet 2   No current facility-administered medications on file prior to visit.    Allergies  Allergen Reactions  . Niacin     REACTION: unbearable hot flashes    Past Medical History  Diagnosis Date  . DM (diabetes mellitus)   . Hyperlipidemia   . HTN (hypertension)   . Colon polyps     Past Surgical History  Procedure Laterality Date  . Wrist surgery  1969    Family History  Problem Relation Age of Onset  . Heart attack    . Hypertension Mother   . Hyperlipidemia Mother   . Stroke Mother     History   Social History  . Marital Status: Married    Spouse Name: N/A  . Number of  Children: N/A  . Years of Education: N/A   Occupational History  . Not on file.   Social History Main Topics  . Smoking status: Never Smoker   . Smokeless tobacco: Never Used  . Alcohol Use: No  . Drug Use: No  . Sexual Activity: Not on file   Other Topics Concern  . Not on file   Social History Narrative   The PMH, PSH, Social History, Family History, Medications, and allergies have been reviewed in [facility], and have been updated if relevant.  Review of Systems  Constitutional: Negative.   HENT: Positive for congestion.   Eyes: Negative.   Respiratory: Negative.   Cardiovascular: Negative.   Endocrine: Negative.   Musculoskeletal: Negative.   Skin: Negative.   Neurological: Negative.   Hematological: Negative.   Psychiatric/Behavioral: Negative.        Objective:    BP 124/76 mmHg  Pulse 92  Temp(Src) 98.1 F (36.7 C) (Oral)  Wt 143 lb 8  oz (65.091 kg)  SpO2 97%   Physical Exam  Constitutional: She is oriented to person, place, and time. She appears well-developed and well-nourished. No distress.  HENT:  Head: Normocephalic.  Eyes: Conjunctivae are normal.  Neck: Normal range of motion.  Cardiovascular: Normal rate and regular rhythm.   Pulmonary/Chest: Effort normal and breath sounds normal.  Musculoskeletal:       Left wrist: She exhibits normal range of motion, no tenderness, no bony tenderness, no swelling, no effusion, no crepitus, no deformity and no laceration.  Neurological: She is alert and oriented to person, place, and time. No cranial nerve deficit.  Skin: Skin is warm and dry. No rash noted.  Psychiatric: She has a normal mood and affect. Her behavior is normal. Judgment and thought content normal.  Nursing note and vitals reviewed.         Assessment & Plan:   Essential hypertension  CKD (chronic kidney disease) stage 3, GFR 30-59 ml/min  Type 2 diabetes mellitus with diabetic nephropathy No Follow-up on file.

## 2014-10-20 ENCOUNTER — Other Ambulatory Visit: Payer: Self-pay | Admitting: Family Medicine

## 2014-10-23 ENCOUNTER — Other Ambulatory Visit: Payer: Self-pay | Admitting: Family Medicine

## 2015-01-27 ENCOUNTER — Other Ambulatory Visit: Payer: Self-pay | Admitting: Family Medicine

## 2015-02-07 ENCOUNTER — Telehealth: Payer: Self-pay | Admitting: Family Medicine

## 2015-02-07 DIAGNOSIS — E1121 Type 2 diabetes mellitus with diabetic nephropathy: Secondary | ICD-10-CM

## 2015-02-07 DIAGNOSIS — N183 Chronic kidney disease, stage 3 unspecified: Secondary | ICD-10-CM

## 2015-02-07 DIAGNOSIS — K219 Gastro-esophageal reflux disease without esophagitis: Secondary | ICD-10-CM

## 2015-02-07 DIAGNOSIS — I10 Essential (primary) hypertension: Secondary | ICD-10-CM

## 2015-02-07 NOTE — Telephone Encounter (Signed)
-----   Message from Ellamae Sia sent at 02/06/2015  4:02 PM EDT ----- Regarding: Lab orders for Thursday, 7.7.16 5 month f/u labs, Dr Hulen Shouts pt

## 2015-02-08 ENCOUNTER — Other Ambulatory Visit: Payer: Medicare Other

## 2015-02-12 ENCOUNTER — Other Ambulatory Visit (INDEPENDENT_AMBULATORY_CARE_PROVIDER_SITE_OTHER): Payer: Medicare Other

## 2015-02-12 DIAGNOSIS — E1121 Type 2 diabetes mellitus with diabetic nephropathy: Secondary | ICD-10-CM | POA: Diagnosis not present

## 2015-02-12 DIAGNOSIS — I10 Essential (primary) hypertension: Secondary | ICD-10-CM

## 2015-02-12 DIAGNOSIS — N183 Chronic kidney disease, stage 3 unspecified: Secondary | ICD-10-CM

## 2015-02-12 LAB — COMPREHENSIVE METABOLIC PANEL
ALBUMIN: 3.9 g/dL (ref 3.5–5.2)
ALT: 34 U/L (ref 0–35)
AST: 27 U/L (ref 0–37)
Alkaline Phosphatase: 84 U/L (ref 39–117)
BUN: 14 mg/dL (ref 6–23)
CHLORIDE: 108 meq/L (ref 96–112)
CO2: 27 mEq/L (ref 19–32)
CREATININE: 1.07 mg/dL (ref 0.40–1.20)
Calcium: 9.5 mg/dL (ref 8.4–10.5)
GFR: 54.32 mL/min — AB (ref >60.00)
Glucose, Bld: 154 mg/dL — ABNORMAL HIGH (ref 70–99)
POTASSIUM: 4.3 meq/L (ref 3.5–5.1)
SODIUM: 142 meq/L (ref 135–145)
Total Bilirubin: 0.4 mg/dL (ref 0.2–1.2)
Total Protein: 7.3 g/dL (ref 6.0–8.3)

## 2015-02-12 LAB — LIPID PANEL
Cholesterol: 151 mg/dL (ref 0–200)
HDL: 34.2 mg/dL — ABNORMAL LOW (ref >39.00)
NONHDL: 116.8
TRIGLYCERIDES: 215 mg/dL — AB (ref 0.0–149.0)
Total CHOL/HDL Ratio: 4
VLDL: 43 mg/dL — AB (ref 0.0–40.0)

## 2015-02-12 LAB — LDL CHOLESTEROL, DIRECT: Direct LDL: 81 mg/dL

## 2015-02-12 LAB — HEMOGLOBIN A1C: HEMOGLOBIN A1C: 7.1 % — AB (ref 4.6–6.5)

## 2015-02-14 ENCOUNTER — Ambulatory Visit (INDEPENDENT_AMBULATORY_CARE_PROVIDER_SITE_OTHER): Payer: Medicare Other | Admitting: Family Medicine

## 2015-02-14 ENCOUNTER — Encounter: Payer: Self-pay | Admitting: Family Medicine

## 2015-02-14 VITALS — BP 120/64 | HR 78 | Temp 97.8°F | Ht 65.0 in | Wt 145.5 lb

## 2015-02-14 DIAGNOSIS — I10 Essential (primary) hypertension: Secondary | ICD-10-CM

## 2015-02-14 DIAGNOSIS — E1121 Type 2 diabetes mellitus with diabetic nephropathy: Secondary | ICD-10-CM

## 2015-02-14 DIAGNOSIS — N183 Chronic kidney disease, stage 3 unspecified: Secondary | ICD-10-CM

## 2015-02-14 DIAGNOSIS — E785 Hyperlipidemia, unspecified: Secondary | ICD-10-CM | POA: Diagnosis not present

## 2015-02-14 NOTE — Patient Instructions (Signed)
Good to see you. Please stop by to see Wanda [name] on your way out or she can call you with your eye appointment.  Come see me in 6 months.

## 2015-02-14 NOTE — Assessment & Plan Note (Signed)
a1c improving. Referral placed for optho for diabetic eye exam. Foot exam today. UTD pneumococcal vaccines. On ACEI and statin. No changes in rxs today. Did advise increasing physical activity.

## 2015-02-14 NOTE — Assessment & Plan Note (Signed)
Well controlled- at goal for a diabetic. No changes made to rx today.

## 2015-02-14 NOTE — Assessment & Plan Note (Signed)
Renal function stable, continue ACEI.  Neg urine micro

## 2015-02-14 NOTE — Progress Notes (Signed)
Pre visit review using our clinic review tool, if applicable. No additional management support is needed unless otherwise documented below in the visit note. 

## 2015-02-14 NOTE — Progress Notes (Signed)
Subjective:   Patient ID: Wanda [name], female    DOB: [birth year], 69 y.o.   MRN: [id number]  Cerys Winget is a pleasant 69 y.o. year old female who presents to clinic today with Follow-up  on 02/14/2015  HPI:  DM II- diagnosed 10 years ago.  Urine micro neg 10/02/2014.  On ACEI. Pneumovax 04/25/13 Prevnar 13 10/02/14 Renal function improved. Due for eye exam. Lab Results  Component Value Date   HGBA1C 7.1* 02/12/2015   On Metformin 1500 mg daily, Januvia 100 mg daily, Glipizide 5 mg daily. Admits to not checking FSBS routinely. Denies any episodes of hypoglycemia.  HLD- LDL now at goal.  HDL much better and TG improved. Lab Results  Component Value Date   CHOL 151 02/12/2015   HDL 34.20* 02/12/2015   LDLCALC 85 09/27/2014   LDLDIRECT 81.0 02/12/2015   TRIG 215.0* 02/12/2015   CHOLHDL 4 02/12/2015    Wt Readings from Last 3 Encounters:  02/14/15 145 lb 8 oz (65.998 kg)  10/02/14 143 lb 8 oz (65.091 kg)  07/10/14 144 lb 4 oz (65.431 kg)     CKD III- due to diabetes, HTN- has seen Dr. Candiss Norse. Lab Results  Component Value Date   CREATININE 1.07 02/12/2015     Lab Results  Component Value Date   NA 142 02/12/2015   K 4.3 02/12/2015   CL 108 02/12/2015   CO2 27 02/12/2015    HTN- at goal for diabetic. On Lisinopril 10 mg daily. No CP, SOB, blurred vision or headaches.   Current Outpatient Prescriptions on File Prior to Visit  Medication Sig Dispense Refill  . aspirin 81 MG tablet Take 81 mg by mouth daily.    . busPIRone (BUSPAR) 15 MG tablet TAKE 1/2 TABLET BY MOUTH 2 TIMES DAILY. 60 tablet 2  . GLIPIZIDE XL 5 MG 24 hr tablet TAKE 1 TABLET BY MOUTH ONCE A DAY 90 tablet 1  . glucose blood (FREESTYLE LITE) test strip Use as instructed 50 each 5  . HYDROcodone-acetaminophen (NORCO) 5-325 MG per tablet Take 1 tablet by mouth every 4 (four) hours as needed for moderate pain. 20 tablet 0  . JANUVIA 100 MG tablet TAKE 1 TABLET BY MOUTH ONCE DAILY. 90 tablet 3  .  lisinopril (PRINIVIL,ZESTRIL) 10 MG tablet TAKE 1 TABLET BY MOUTH ONCE A DAY. 90 tablet 3  . metFORMIN (GLUCOPHAGE-XR) 500 MG 24 hr tablet TAKE 2 TABLETS BY MOUTH EVERY MORNING WITH BREAKFAST. 60 tablet 1  . omeprazole (PRILOSEC) 20 MG capsule Take 20 mg by mouth daily as needed.     . pravastatin (PRAVACHOL) 10 MG tablet TAKE 1 TABLET BY MOUTH DAILY. 30 tablet 5   No current facility-administered medications on file prior to visit.    Allergies  Allergen Reactions  . Niacin     REACTION: unbearable hot flashes    Past Medical History  Diagnosis Date  . DM (diabetes mellitus)   . Hyperlipidemia   . HTN (hypertension)   . Colon polyps     Past Surgical History  Procedure Laterality Date  . Wrist surgery  1969    Family History  Problem Relation Age of Onset  . Heart attack    . Hypertension Mother   . Hyperlipidemia Mother   . Stroke Mother     History   Social History  . Marital Status: Married    Spouse Name: N/A  . Number of Children: N/A  . Years of Education: N/A   Occupational  History  . Not on file.   Social History Main Topics  . Smoking status: Never Smoker   . Smokeless tobacco: Never Used  . Alcohol Use: No  . Drug Use: No  . Sexual Activity: Not on file   Other Topics Concern  . Not on file   Social History Narrative   The PMH, PSH, Social History, Family History, Medications, and allergies have been reviewed in [facility], and have been updated if relevant.  Review of Systems  Constitutional: Negative.   HENT: Negative.   Eyes: Negative.   Respiratory: Negative.   Cardiovascular: Negative.   Gastrointestinal: Negative.   Endocrine: Negative.   Genitourinary: Negative.   Musculoskeletal: Negative.   Skin: Negative.   Neurological: Negative.   Hematological: Negative.   Psychiatric/Behavioral: Negative.        Objective:    BP 120/64 mmHg  Pulse 78  Temp(Src) 97.8 F (36.6 C) (Oral)  Ht 5\' 5"  (1.651 m)  Wt 145 lb 8 oz (65.998 kg)   BMI 24.21 kg/m2  Wt Readings from Last 3 Encounters:  02/14/15 145 lb 8 oz (65.998 kg)  10/02/14 143 lb 8 oz (65.091 kg)  07/10/14 144 lb 4 oz (65.431 kg)    Physical Exam  Constitutional: She is oriented to person, place, and time. She appears well-developed and well-nourished. No distress.  HENT:  Head: Normocephalic.  Eyes: Conjunctivae are normal.  Neck: Normal range of motion.  Cardiovascular: Normal rate and regular rhythm.   Pulmonary/Chest: Effort normal and breath sounds normal.  Musculoskeletal:       Left wrist: She exhibits normal range of motion, no tenderness, no bony tenderness, no swelling, no effusion, no crepitus, no deformity and no laceration.  Neurological: She is alert and oriented to person, place, and time. No cranial nerve deficit.  Skin: Skin is warm and dry. No rash noted.  Psychiatric: She has a normal mood and affect. Her behavior is normal. Judgment and thought content normal.  Nursing note and vitals reviewed.         Assessment & Plan:   Type 2 diabetes mellitus with diabetic nephropathy  CKD (chronic kidney disease) stage 3, GFR 30-59 ml/min  Essential hypertension  HLD (hyperlipidemia) No Follow-up on file.

## 2015-04-02 ENCOUNTER — Other Ambulatory Visit: Payer: Self-pay | Admitting: Family Medicine

## 2015-04-16 ENCOUNTER — Other Ambulatory Visit: Payer: Self-pay | Admitting: Family Medicine

## 2015-04-27 LAB — HM DIABETES EYE EXAM

## 2015-05-08 ENCOUNTER — Encounter: Payer: Self-pay | Admitting: Family Medicine

## 2015-05-31 ENCOUNTER — Other Ambulatory Visit: Payer: Self-pay | Admitting: Family Medicine

## 2015-06-07 ENCOUNTER — Ambulatory Visit (INDEPENDENT_AMBULATORY_CARE_PROVIDER_SITE_OTHER): Payer: Medicare Other | Admitting: Family Medicine

## 2015-06-07 ENCOUNTER — Encounter: Payer: Self-pay | Admitting: Family Medicine

## 2015-06-07 VITALS — BP 132/82 | HR 92 | Temp 97.9°F | Wt 145.5 lb

## 2015-06-07 DIAGNOSIS — Z636 Dependent relative needing care at home: Secondary | ICD-10-CM | POA: Insufficient documentation

## 2015-06-07 MED ORDER — BUSPIRONE HCL 15 MG PO TABS: 15.0000 mg | ORAL_TABLET | Freq: Two times a day (BID) | ORAL | Status: DC

## 2015-06-07 NOTE — Progress Notes (Signed)
Pre visit review using our clinic review tool, if applicable. No additional management support is needed unless otherwise documented below in the visit note. 

## 2015-06-07 NOTE — Assessment & Plan Note (Signed)
>  25 minutes spent in face to face time with patient, >50% spent in counselling or coordination of care Advised to increase Buspar to 15 mg twice daily. Offered my support. She will continue to update [place].

## 2015-06-07 NOTE — Progress Notes (Signed)
Subjective:   Patient ID: Wanda [name], female    DOB: Jan 27, 1946, 69 y.o.   MRN: [id number]  Wanda [name] is a pleasant 69 y.o. year old female who presents to clinic today with Stress  on 06/07/2015  HPI: Husband unfortunately is not doing well. In the hospital, recently diagnosed with Lewy Body dementia and it is rapidly progressing.  Son is thinking of moving back in to her help her out.  Her husband Patrick Jupiter is now even having difficulty walking.  Last week, he was walking without much difficulty at all.  She cant believe how fast this is happening.  Tearful, not sleeping well.  No SI or HI.  "I just needed to talk to you."  Current Outpatient Prescriptions on File Prior to Visit  Medication Sig Dispense Refill  . aspirin 81 MG tablet Take 81 mg by mouth daily.    Marland Kitchen FREESTYLE LITE test strip USE AS INSTRUCTED DAILY. 50 each 5  . GLIPIZIDE XL 5 MG 24 hr tablet TAKE 1 TABLET BY MOUTH ONCE A DAY 90 tablet 1  . HYDROcodone-acetaminophen (NORCO) 5-325 MG per tablet Take 1 tablet by mouth every 4 (four) hours as needed for moderate pain. 20 tablet 0  . JANUVIA 100 MG tablet TAKE 1 TABLET BY MOUTH ONCE DAILY. 90 tablet 3  . lisinopril (PRINIVIL,ZESTRIL) 10 MG tablet TAKE 1 TABLET BY MOUTH ONCE A DAY. 90 tablet 3  . metFORMIN (GLUCOPHAGE-XR) 500 MG 24 hr tablet TAKE 2 TABLETS BY MOUTH EVERY MORNING WITH BREAKFAST. 60 tablet 1  . omeprazole (PRILOSEC) 20 MG capsule Take 20 mg by mouth daily as needed.     . pravastatin (PRAVACHOL) 10 MG tablet TAKE 1 TABLET BY MOUTH DAILY. 30 tablet 2   No current facility-administered medications on file prior to visit.    Allergies  Allergen Reactions  . Niacin     REACTION: unbearable hot flashes    Past Medical History  Diagnosis Date  . DM (diabetes mellitus) ([place])   . Hyperlipidemia   . HTN (hypertension)   . Colon polyps     Past Surgical History  Procedure Laterality Date  . Wrist surgery  1969    Family History  Problem Relation  Age of Onset  . Heart attack    . Hypertension Mother   . Hyperlipidemia Mother   . Stroke Mother     Social History   Social History  . Marital Status: Married    Spouse Name: N/A  . Number of Children: N/A  . Years of Education: N/A   Occupational History  . Not on file.   Social History Main Topics  . Smoking status: Never Smoker   . Smokeless tobacco: Never Used  . Alcohol Use: No  . Drug Use: No  . Sexual Activity: Not on file   Other Topics Concern  . Not on file   Social History Narrative   The PMH, PSH, Social History, Family History, Medications, and allergies have been reviewed in [facility], and have been updated if relevant.   Review of Systems  Psychiatric/Behavioral: Positive for sleep disturbance, dysphoric mood and decreased concentration. Negative for suicidal ideas, behavioral problems, confusion, self-injury and agitation. The patient is nervous/anxious.   All other systems reviewed and are negative.      Objective:    BP 132/82 mmHg  Pulse 92  Temp(Src) 97.9 F (36.6 C) (Oral)  Wt 145 lb 8 oz (65.998 kg)  SpO2 98%   Physical Exam  Constitutional:  She is oriented to person, place, and time. She appears well-developed and well-nourished.  tearful  HENT:  Head: Normocephalic.  Eyes: Conjunctivae are normal.  Cardiovascular: Normal rate.   Pulmonary/Chest: Effort normal.  Musculoskeletal: Normal range of motion.  Neurological: She is alert and oriented to person, place, and time. No cranial nerve deficit.  Skin: Skin is warm and dry.  Psychiatric: She has a normal mood and affect. Her behavior is normal. Thought content normal.  Nursing note and vitals reviewed.         Assessment & Plan:   No diagnosis found. No Follow-up on file.

## 2015-06-10 ENCOUNTER — Encounter: Payer: Self-pay | Admitting: Family Medicine

## 2015-06-14 ENCOUNTER — Other Ambulatory Visit: Payer: Self-pay | Admitting: *Deleted

## 2015-06-14 MED ORDER — GLIPIZIDE ER 5 MG PO TB24: 5.0000 mg | ORAL_TABLET | Freq: Every day | ORAL | Status: DC

## 2015-08-07 ENCOUNTER — Other Ambulatory Visit: Payer: Self-pay | Admitting: Internal Medicine

## 2015-08-07 DIAGNOSIS — E119 Type 2 diabetes mellitus without complications: Secondary | ICD-10-CM

## 2015-08-09 ENCOUNTER — Other Ambulatory Visit: Payer: Self-pay | Admitting: Family Medicine

## 2015-08-15 ENCOUNTER — Other Ambulatory Visit: Payer: Medicare Other

## 2015-08-16 ENCOUNTER — Other Ambulatory Visit (INDEPENDENT_AMBULATORY_CARE_PROVIDER_SITE_OTHER): Payer: Medicare Other

## 2015-08-16 DIAGNOSIS — E119 Type 2 diabetes mellitus without complications: Secondary | ICD-10-CM | POA: Diagnosis not present

## 2015-08-16 LAB — COMPREHENSIVE METABOLIC PANEL
ALK PHOS: 72 U/L (ref 39–117)
ALT: 17 U/L (ref 0–35)
AST: 17 U/L (ref 0–37)
Albumin: 4.1 g/dL (ref 3.5–5.2)
BILIRUBIN TOTAL: 0.4 mg/dL (ref 0.2–1.2)
BUN: 22 mg/dL (ref 6–23)
CHLORIDE: 110 meq/L (ref 96–112)
CO2: 25 meq/L (ref 19–32)
Calcium: 9.5 mg/dL (ref 8.4–10.5)
Creatinine, Ser: 0.98 mg/dL (ref 0.40–1.20)
GFR: 60.03 mL/min (ref >60.00)
GLUCOSE: 107 mg/dL — AB (ref 70–99)
POTASSIUM: 4.3 meq/L (ref 3.5–5.1)
SODIUM: 141 meq/L (ref 135–145)
Total Protein: 7.1 g/dL (ref 6.0–8.3)

## 2015-08-16 LAB — LIPID PANEL
CHOLESTEROL: 148 mg/dL (ref 0–200)
HDL: 32.4 mg/dL — AB (ref >39.00)
LDL Cholesterol: 83 mg/dL (ref 0–99)
NonHDL: 115.77
TRIGLYCERIDES: 165 mg/dL — AB (ref 0.0–149.0)
Total CHOL/HDL Ratio: 5
VLDL: 33 mg/dL (ref 0.0–40.0)

## 2015-08-16 LAB — HEMOGLOBIN A1C: Hgb A1c MFr Bld: 7.1 % — ABNORMAL HIGH (ref 4.6–6.5)

## 2015-08-20 ENCOUNTER — Ambulatory Visit (INDEPENDENT_AMBULATORY_CARE_PROVIDER_SITE_OTHER): Payer: Medicare Other | Admitting: Family Medicine

## 2015-08-20 ENCOUNTER — Encounter: Payer: Self-pay | Admitting: Family Medicine

## 2015-08-20 VITALS — BP 132/70 | HR 66 | Temp 97.8°F | Wt 143.5 lb

## 2015-08-20 DIAGNOSIS — E1121 Type 2 diabetes mellitus with diabetic nephropathy: Secondary | ICD-10-CM

## 2015-08-20 DIAGNOSIS — Z636 Dependent relative needing care at home: Secondary | ICD-10-CM

## 2015-08-20 DIAGNOSIS — N183 Chronic kidney disease, stage 3 unspecified: Secondary | ICD-10-CM

## 2015-08-20 DIAGNOSIS — E785 Hyperlipidemia, unspecified: Secondary | ICD-10-CM

## 2015-08-20 DIAGNOSIS — R0982 Postnasal drip: Secondary | ICD-10-CM

## 2015-08-20 DIAGNOSIS — I10 Essential (primary) hypertension: Secondary | ICD-10-CM

## 2015-08-20 MED ORDER — OMEPRAZOLE 20 MG PO CPDR: 20.0000 mg | DELAYED_RELEASE_CAPSULE | Freq: Every day | ORAL | Status: DC | PRN

## 2015-08-20 NOTE — Assessment & Plan Note (Signed)
Improving.

## 2015-08-20 NOTE — Assessment & Plan Note (Signed)
At goal for a diabetic. No changes made today. 

## 2015-08-20 NOTE — Assessment & Plan Note (Signed)
Unfortunately her husband's health is declining but she feels she is coping well. Offered my continued support.

## 2015-08-20 NOTE — Progress Notes (Signed)
Pre visit review using our clinic review tool, if applicable. No additional management support is needed unless otherwise documented below in the visit note. 

## 2015-08-20 NOTE — Progress Notes (Signed)
Subjective:   Patient ID: Wanda [name], female    DOB: 02/20/1946, 70 y.o.   MRN: [id number]  Wanda [name] is a pleasant 70 y.o. year old female who presents to clinic today with Follow-up and Nasal Congestion  on 08/20/2015  HPI:  She is doing ok. Still caring for her husband who has LBD but feels she is coping better with this.  Has had some runny nose, PND.  No sinus pain, fever, chills, cough or SOB.  DM II- diagnosed over 10 years ago.  Urine micro neg 10/02/2014.  On ACEI. Pneumovax 04/25/13 Prevnar 13 10/02/14 Renal function improved. a1c has been stable. Lab Results  Component Value Date   HGBA1C 7.1* 08/16/2015   On Metformin 1500 mg daily, Januvia 100 mg daily, Glipizide 5 mg daily. Admits to not checking FSBS routinely. Denies any episodes of hypoglycemia.  HLD- LDL  at goal.   Lab Results  Component Value Date   CHOL 148 08/16/2015   HDL 32.40* 08/16/2015   LDLCALC 83 08/16/2015   LDLDIRECT 81.0 02/12/2015   TRIG 165.0* 08/16/2015   CHOLHDL 5 08/16/2015    Wt Readings from Last 3 Encounters:  08/20/15 143 lb 8 oz (65.091 kg)  06/07/15 145 lb 8 oz (65.998 kg)  02/14/15 145 lb 8 oz (65.998 kg)     CKD III- due to diabetes, HTN- has seen Dr. Candiss Norse. GFR and CR now normal. Lab Results  Component Value Date   CREATININE 0.98 08/16/2015     Lab Results  Component Value Date   NA 141 08/16/2015   K 4.3 08/16/2015   CL 110 08/16/2015   CO2 25 08/16/2015    HTN- at goal for diabetic. On Lisinopril 10 mg daily. No CP, SOB, blurred vision or headaches.  Lab Results  Component Value Date   CREATININE 0.98 08/16/2015    Current Outpatient Prescriptions on File Prior to Visit  Medication Sig Dispense Refill  . busPIRone (BUSPAR) 15 MG tablet Take 1 tablet (15 mg total) by mouth 2 (two) times daily. 60 tablet 3  . FREESTYLE LITE test strip USE AS INSTRUCTED DAILY. 50 each 5  . glipiZIDE (GLIPIZIDE XL) 5 MG 24 hr tablet Take 1 tablet (5 mg total) by  mouth daily. 90 tablet 1  . JANUVIA 100 MG tablet TAKE 1 TABLET BY MOUTH ONCE DAILY. 90 tablet 3  . lisinopril (PRINIVIL,ZESTRIL) 10 MG tablet TAKE 1 TABLET BY MOUTH ONCE A DAY. 90 tablet 3  . metFORMIN (GLUCOPHAGE-XR) 500 MG 24 hr tablet Take 1 tablet (500 mg total) by mouth daily with breakfast. OFFICE VISIT WITH LABS REQUIRED FOR ADDITIONAL REFILLS 60 tablet 0  . pravastatin (PRAVACHOL) 10 MG tablet TAKE 1 TABLET BY MOUTH DAILY. 30 tablet 2   No current facility-administered medications on file prior to visit.    Allergies  Allergen Reactions  . Niacin     REACTION: unbearable hot flashes    Past Medical History  Diagnosis Date  . DM (diabetes mellitus) ([place])   . Hyperlipidemia   . HTN (hypertension)   . Colon polyps     Past Surgical History  Procedure Laterality Date  . Wrist surgery  1969    Family History  Problem Relation Age of Onset  . Heart attack    . Hypertension Mother   . Hyperlipidemia Mother   . Stroke Mother     Social History   Social History  . Marital Status: Married    Spouse Name: N/A  .  Number of Children: N/A  . Years of Education: N/A   Occupational History  . Not on file.   Social History Main Topics  . Smoking status: Never Smoker   . Smokeless tobacco: Never Used  . Alcohol Use: No  . Drug Use: No  . Sexual Activity: Not on file   Other Topics Concern  . Not on file   Social History Narrative   The PMH, PSH, Social History, Family History, Medications, and allergies have been reviewed in [facility], and have been updated if relevant.  Review of Systems  Constitutional: Negative.   HENT: Positive for congestion, postnasal drip and rhinorrhea. Negative for sinus pressure.   Eyes: Negative.   Respiratory: Negative.  Negative for cough, choking and chest tightness.   Cardiovascular: Negative.   Gastrointestinal: Negative.   Endocrine: Negative.   Genitourinary: Negative.   Musculoskeletal: Negative.   Skin: Negative.     Neurological: Negative.   Hematological: Negative.   Psychiatric/Behavioral: Negative.        Objective:    BP 132/70 mmHg  Pulse 66  Temp(Src) 97.8 F (36.6 C) (Oral)  Wt 143 lb 8 oz (65.091 kg)  SpO2 98%  Wt Readings from Last 3 Encounters:  08/20/15 143 lb 8 oz (65.091 kg)  06/07/15 145 lb 8 oz (65.998 kg)  02/14/15 145 lb 8 oz (65.998 kg)    Physical Exam  Constitutional: She is oriented to person, place, and time. She appears well-developed and well-nourished. No distress.  HENT:  Head: Normocephalic.  Right Ear: Hearing and tympanic membrane normal.  Left Ear: Hearing and tympanic membrane normal.  Nose: Rhinorrhea present. No sinus tenderness, nasal deformity, septal deviation or nasal septal hematoma. No epistaxis.  No foreign bodies. Right sinus exhibits no maxillary sinus tenderness and no frontal sinus tenderness. Left sinus exhibits no maxillary sinus tenderness and no frontal sinus tenderness.  Eyes: Conjunctivae are normal.  Neck: Normal range of motion.  Cardiovascular: Normal rate and regular rhythm.   Pulmonary/Chest: Effort normal and breath sounds normal.  Neurological: She is alert and oriented to person, place, and time. No cranial nerve deficit.  Skin: Skin is warm and dry. No rash noted.  Psychiatric: She has a normal mood and affect. Her behavior is normal. Judgment and thought content normal.  Nursing note and vitals reviewed.         Assessment & Plan:   HLD (hyperlipidemia)  Type 2 diabetes mellitus with diabetic nephropathy, without long-term current use of insulin (HCC)  CKD (chronic kidney disease) stage 3, GFR 30-59 ml/min  Caregiver stress No Follow-up on file.

## 2015-08-20 NOTE — Assessment & Plan Note (Signed)
a1c stable. Kidney function improved. No changes made today. Follow up in 6 months. The patient indicates understanding of these issues and agrees with the plan.

## 2015-08-20 NOTE — Assessment & Plan Note (Signed)
New- exam reassuring. No signs of infection. Advised supportive care with flonase and anhistamines. Call or return to clinic prn if these symptoms worsen or fail to improve as anticipated. The patient indicates understanding of these issues and agrees with the plan.

## 2015-08-20 NOTE — Patient Instructions (Signed)
Great to see you. Your labs look fantastic! 

## 2015-08-20 NOTE — Assessment & Plan Note (Signed)
At goal for diabetic. Continue ACEI. No changes made today.

## 2015-09-19 ENCOUNTER — Other Ambulatory Visit: Payer: Self-pay | Admitting: Family Medicine

## 2015-10-08 ENCOUNTER — Other Ambulatory Visit: Payer: Self-pay | Admitting: Family Medicine

## 2015-11-02 ENCOUNTER — Other Ambulatory Visit: Payer: Self-pay | Admitting: *Deleted

## 2015-11-02 MED ORDER — METFORMIN HCL ER 500 MG PO TB24: ORAL_TABLET | ORAL | Status: DC

## 2015-11-13 ENCOUNTER — Other Ambulatory Visit: Payer: Self-pay | Admitting: Family Medicine

## 2015-11-13 DIAGNOSIS — I10 Essential (primary) hypertension: Secondary | ICD-10-CM

## 2015-11-13 DIAGNOSIS — E1121 Type 2 diabetes mellitus with diabetic nephropathy: Secondary | ICD-10-CM

## 2015-11-14 ENCOUNTER — Other Ambulatory Visit (INDEPENDENT_AMBULATORY_CARE_PROVIDER_SITE_OTHER): Payer: Medicare Other

## 2015-11-14 DIAGNOSIS — E1121 Type 2 diabetes mellitus with diabetic nephropathy: Secondary | ICD-10-CM

## 2015-11-14 LAB — LIPID PANEL
CHOL/HDL RATIO: 5
CHOLESTEROL: 166 mg/dL (ref 0–200)
HDL: 32.5 mg/dL — ABNORMAL LOW (ref >39.00)
NonHDL: 133.08
TRIGLYCERIDES: 285 mg/dL — AB (ref 0.0–149.0)
VLDL: 57 mg/dL — ABNORMAL HIGH (ref 0.0–40.0)

## 2015-11-14 LAB — COMPREHENSIVE METABOLIC PANEL
ALBUMIN: 4 g/dL (ref 3.5–5.2)
ALK PHOS: 73 U/L (ref 39–117)
ALT: 14 U/L (ref 0–35)
AST: 14 U/L (ref 0–37)
BILIRUBIN TOTAL: 0.6 mg/dL (ref 0.2–1.2)
BUN: 16 mg/dL (ref 6–23)
CALCIUM: 9.7 mg/dL (ref 8.4–10.5)
CHLORIDE: 111 meq/L (ref 96–112)
CO2: 26 mEq/L (ref 19–32)
CREATININE: 1.13 mg/dL (ref 0.40–1.20)
GFR: 50.89 mL/min — ABNORMAL LOW (ref >60.00)
Glucose, Bld: 125 mg/dL — ABNORMAL HIGH (ref 70–99)
Potassium: 4.9 mEq/L (ref 3.5–5.1)
SODIUM: 143 meq/L (ref 135–145)
TOTAL PROTEIN: 7.5 g/dL (ref 6.0–8.3)

## 2015-11-14 LAB — LDL CHOLESTEROL, DIRECT: Direct LDL: 74 mg/dL

## 2015-11-14 LAB — HEMOGLOBIN A1C: Hgb A1c MFr Bld: 7.9 % — ABNORMAL HIGH (ref 4.6–6.5)

## 2015-11-19 ENCOUNTER — Ambulatory Visit (INDEPENDENT_AMBULATORY_CARE_PROVIDER_SITE_OTHER): Payer: Medicare Other | Admitting: Family Medicine

## 2015-11-19 ENCOUNTER — Encounter: Payer: Self-pay | Admitting: Family Medicine

## 2015-11-19 VITALS — BP 112/68 | HR 80 | Temp 97.8°F | Ht 65.0 in | Wt 143.2 lb

## 2015-11-19 DIAGNOSIS — N183 Chronic kidney disease, stage 3 unspecified: Secondary | ICD-10-CM

## 2015-11-19 DIAGNOSIS — I10 Essential (primary) hypertension: Secondary | ICD-10-CM | POA: Diagnosis not present

## 2015-11-19 DIAGNOSIS — E1121 Type 2 diabetes mellitus with diabetic nephropathy: Secondary | ICD-10-CM

## 2015-11-19 DIAGNOSIS — E785 Hyperlipidemia, unspecified: Secondary | ICD-10-CM | POA: Diagnosis not present

## 2015-11-19 MED ORDER — METFORMIN HCL ER 500 MG PO TB24: ORAL_TABLET | ORAL | Status: DC

## 2015-11-19 NOTE — Assessment & Plan Note (Signed)
Deteriorated. Discussed tx options with pt. Will increase Metformin to 1000 mg every morning and 500 mg every evening. Follow up in 3 months.

## 2015-11-19 NOTE — Progress Notes (Signed)
Subjective:   Patient ID: Wanda [name], female    DOB: 09-13-1945, 70 y.o.   MRN: [id number]  Mukta Svatos is a pleasant 70 y.o. year old female who presents to clinic today with Follow-up  on 11/19/2015  HPI:  She is doing ok. Still caring for her husband who has LBD but feels she is coping better with this.  Has had some runny nose, PND.  No sinus pain, fever, chills, cough or SOB.  DM II- diagnosed over 10 years ago.    On ACEI. Pneumovax 04/25/13 Prevnar 13 10/02/14 Renal function improved. a1c has been stable. Lab Results  Component Value Date   HGBA1C 7.9* 11/14/2015   On Metformin 1000 mg daily, Januvia 100 mg daily, Glipizide 5 mg daily. Admits to not checking FSBS routinely. Denies any episodes of hypoglycemia.  HLD- LDL  at goal.   Lab Results  Component Value Date   CHOL 166 11/14/2015   HDL 32.50* 11/14/2015   LDLCALC 83 08/16/2015   LDLDIRECT 74.0 11/14/2015   TRIG 285.0* 11/14/2015   CHOLHDL 5 11/14/2015    Wt Readings from Last 3 Encounters:  11/19/15 143 lb 4 oz (64.978 kg)  08/20/15 143 lb 8 oz (65.091 kg)  06/07/15 145 lb 8 oz (65.998 kg)     CKD III- due to diabetes, HTN- has seen Dr. Candiss Norse. GFR and CR now normal. Lab Results  Component Value Date   CREATININE 1.13 11/14/2015     Lab Results  Component Value Date   NA 143 11/14/2015   K 4.9 11/14/2015   CL 111 11/14/2015   CO2 26 11/14/2015    HTN- at goal for diabetic. On Lisinopril 10 mg daily. No CP, SOB, blurred vision or headaches.  Lab Results  Component Value Date   CREATININE 1.13 11/14/2015    Current Outpatient Prescriptions on File Prior to Visit  Medication Sig Dispense Refill  . busPIRone (BUSPAR) 15 MG tablet Take 1 tablet (15 mg total) by mouth 2 (two) times daily. (Patient taking differently: Take 15 mg by mouth daily. ) 60 tablet 3  . FREESTYLE LITE test strip USE AS INSTRUCTED DAILY. 50 each 5  . glipiZIDE (GLIPIZIDE XL) 5 MG 24 hr tablet Take 1 tablet (5 mg  total) by mouth daily. 90 tablet 1  . JANUVIA 100 MG tablet TAKE 1 TABLET BY MOUTH ONCE DAILY. 90 tablet 3  . lisinopril (PRINIVIL,ZESTRIL) 10 MG tablet TAKE 1 TABLET BY MOUTH ONCE A DAY. 90 tablet 3  . metFORMIN (GLUCOPHAGE-XR) 500 MG 24 hr tablet TAKE 2 TABLETS BY MOUTH DAILY WITH BREAKFAST. 60 tablet 6  . omeprazole (PRILOSEC) 20 MG capsule Take 1 capsule (20 mg total) by mouth daily as needed. 90 capsule 3  . pravastatin (PRAVACHOL) 10 MG tablet TAKE 1 TABLET BY MOUTH DAILY. 90 tablet 0   No current facility-administered medications on file prior to visit.    Allergies  Allergen Reactions  . Niacin     REACTION: unbearable hot flashes    Past Medical History  Diagnosis Date  . DM (diabetes mellitus) ([place])   . Hyperlipidemia   . HTN (hypertension)   . Colon polyps     Past Surgical History  Procedure Laterality Date  . Wrist surgery  1969    Family History  Problem Relation Age of Onset  . Heart attack    . Hypertension Mother   . Hyperlipidemia Mother   . Stroke Mother     Social History  Social History  . Marital Status: Married    Spouse Name: N/A  . Number of Children: N/A  . Years of Education: N/A   Occupational History  . Not on file.   Social History Main Topics  . Smoking status: Never Smoker   . Smokeless tobacco: Never Used  . Alcohol Use: No  . Drug Use: No  . Sexual Activity: Not on file   Other Topics Concern  . Not on file   Social History Narrative   The PMH, PSH, Social History, Family History, Medications, and allergies have been reviewed in [facility], and have been updated if relevant.  Review of Systems  Constitutional: Negative.   HENT: Negative for congestion, postnasal drip, rhinorrhea and sinus pressure.   Eyes: Negative.   Respiratory: Negative.  Negative for cough, choking and chest tightness.   Cardiovascular: Negative.   Gastrointestinal: Negative.   Endocrine: Negative.   Genitourinary: Negative.   Musculoskeletal:  Negative.   Skin: Negative.   Neurological: Negative.   Hematological: Negative.   Psychiatric/Behavioral: Negative.        Objective:    BP 112/68 mmHg  Pulse 80  Temp(Src) 97.8 F (36.6 C) (Oral)  Ht 5\' 5"  (1.651 m)  Wt 143 lb 4 oz (64.978 kg)  BMI 23.84 kg/m2  Wt Readings from Last 3 Encounters:  11/19/15 143 lb 4 oz (64.978 kg)  08/20/15 143 lb 8 oz (65.091 kg)  06/07/15 145 lb 8 oz (65.998 kg)    Physical Exam  Constitutional: She is oriented to person, place, and time. She appears well-developed and well-nourished. No distress.  HENT:  Head: Normocephalic.  Right Ear: Hearing and tympanic membrane normal.  Left Ear: Hearing and tympanic membrane normal.  Nose: Rhinorrhea present. No sinus tenderness, nasal deformity, septal deviation or nasal septal hematoma. No epistaxis.  No foreign bodies. Right sinus exhibits no maxillary sinus tenderness and no frontal sinus tenderness. Left sinus exhibits no maxillary sinus tenderness and no frontal sinus tenderness.  Eyes: Conjunctivae are normal.  Neck: Normal range of motion.  Cardiovascular: Normal rate and regular rhythm.   Pulmonary/Chest: Effort normal and breath sounds normal.  Neurological: She is alert and oriented to person, place, and time. No cranial nerve deficit.  Skin: Skin is warm and dry. No rash noted.  Psychiatric: She has a normal mood and affect. Her behavior is normal. Judgment and thought content normal.  Nursing note and vitals reviewed.         Assessment & Plan:   HLD (hyperlipidemia)  Essential hypertension  Type 2 diabetes mellitus with diabetic nephropathy, without long-term current use of insulin (HCC)  CKD (chronic kidney disease) stage 3, GFR 30-59 ml/min No Follow-up on file.

## 2015-11-19 NOTE — Progress Notes (Signed)
Pre visit review using our clinic review tool, if applicable. No additional management support is needed unless otherwise documented below in the visit note. 

## 2015-11-19 NOTE — Patient Instructions (Signed)
Good to see you. We are increasing your Metformin to 1000 mg every morning and 500 mg every evening.  Please return in 3 months.

## 2015-11-19 NOTE — Assessment & Plan Note (Signed)
LDL at goal for a diabetic.

## 2015-11-19 NOTE — Assessment & Plan Note (Signed)
At goal for a diabetic. No changes made to BP rxs today.

## 2016-01-24 ENCOUNTER — Other Ambulatory Visit: Payer: Self-pay | Admitting: Family Medicine

## 2016-02-19 ENCOUNTER — Other Ambulatory Visit: Payer: Self-pay | Admitting: Family Medicine

## 2016-02-19 ENCOUNTER — Telehealth: Payer: Self-pay

## 2016-02-19 DIAGNOSIS — E1121 Type 2 diabetes mellitus with diabetic nephropathy: Secondary | ICD-10-CM

## 2016-02-19 DIAGNOSIS — N183 Chronic kidney disease, stage 3 unspecified: Secondary | ICD-10-CM

## 2016-02-19 DIAGNOSIS — Z1159 Encounter for screening for other viral diseases: Secondary | ICD-10-CM

## 2016-02-19 DIAGNOSIS — Z01419 Encounter for gynecological examination (general) (routine) without abnormal findings: Secondary | ICD-10-CM

## 2016-02-19 DIAGNOSIS — E785 Hyperlipidemia, unspecified: Secondary | ICD-10-CM

## 2016-02-19 NOTE — Telephone Encounter (Signed)
Adding Hep C to CPE labs

## 2016-02-20 ENCOUNTER — Other Ambulatory Visit (INDEPENDENT_AMBULATORY_CARE_PROVIDER_SITE_OTHER): Payer: Medicare Other

## 2016-02-20 ENCOUNTER — Ambulatory Visit (INDEPENDENT_AMBULATORY_CARE_PROVIDER_SITE_OTHER): Payer: Medicare Other

## 2016-02-20 VITALS — BP 110/70 | HR 70 | Temp 97.7°F | Ht 64.25 in | Wt 139.2 lb

## 2016-02-20 DIAGNOSIS — E785 Hyperlipidemia, unspecified: Secondary | ICD-10-CM | POA: Diagnosis not present

## 2016-02-20 DIAGNOSIS — Z0000 Encounter for general adult medical examination without abnormal findings: Secondary | ICD-10-CM

## 2016-02-20 DIAGNOSIS — E1121 Type 2 diabetes mellitus with diabetic nephropathy: Secondary | ICD-10-CM

## 2016-02-20 DIAGNOSIS — Z01419 Encounter for gynecological examination (general) (routine) without abnormal findings: Secondary | ICD-10-CM

## 2016-02-20 DIAGNOSIS — Z1159 Encounter for screening for other viral diseases: Secondary | ICD-10-CM

## 2016-02-20 LAB — CBC WITH DIFFERENTIAL/PLATELET
BASOS PCT: 0.6 % (ref 0.0–3.0)
Basophils Absolute: 0 10*3/uL (ref 0.0–0.1)
EOS ABS: 0.3 10*3/uL (ref 0.0–0.7)
Eosinophils Relative: 4.8 % (ref 0.0–5.0)
HEMATOCRIT: 35.2 % — AB (ref 36.0–46.0)
Hemoglobin: 11.6 g/dL — ABNORMAL LOW (ref 12.0–15.0)
LYMPHS PCT: 28.2 % (ref 12.0–46.0)
Lymphs Abs: 1.9 10*3/uL (ref 0.7–4.0)
MCHC: 33.2 g/dL (ref 30.0–36.0)
MCV: 92.3 fl (ref 78.0–100.0)
Monocytes Absolute: 0.5 10*3/uL (ref 0.1–1.0)
Monocytes Relative: 7.1 % (ref 3.0–12.0)
NEUTROS ABS: 4.1 10*3/uL (ref 1.4–7.7)
Neutrophils Relative %: 59.3 % (ref 43.0–77.0)
PLATELETS: 244 10*3/uL (ref 150.0–400.0)
RBC: 3.81 Mil/uL — ABNORMAL LOW (ref 3.87–5.11)
RDW: 13.2 % (ref 11.5–15.5)
WBC: 6.9 10*3/uL (ref 4.0–10.5)

## 2016-02-20 LAB — COMPREHENSIVE METABOLIC PANEL
ALT: 16 U/L (ref 0–35)
AST: 16 U/L (ref 0–37)
Albumin: 4.1 g/dL (ref 3.5–5.2)
Alkaline Phosphatase: 72 U/L (ref 39–117)
BUN: 22 mg/dL (ref 6–23)
CALCIUM: 9.4 mg/dL (ref 8.4–10.5)
CHLORIDE: 109 meq/L (ref 96–112)
CO2: 25 meq/L (ref 19–32)
Creatinine, Ser: 1.12 mg/dL (ref 0.40–1.20)
GFR: 51.37 mL/min — AB (ref >60.00)
Glucose, Bld: 114 mg/dL — ABNORMAL HIGH (ref 70–99)
Potassium: 4.5 mEq/L (ref 3.5–5.1)
Sodium: 142 mEq/L (ref 135–145)
Total Bilirubin: 0.4 mg/dL (ref 0.2–1.2)
Total Protein: 7.2 g/dL (ref 6.0–8.3)

## 2016-02-20 LAB — TSH: TSH: 1.27 u[IU]/mL (ref 0.35–4.50)

## 2016-02-20 LAB — LDL CHOLESTEROL, DIRECT: Direct LDL: 86 mg/dL

## 2016-02-20 LAB — LIPID PANEL
CHOL/HDL RATIO: 5
Cholesterol: 159 mg/dL (ref 0–200)
HDL: 31.2 mg/dL — AB (ref >39.00)
NONHDL: 127.33
TRIGLYCERIDES: 222 mg/dL — AB (ref 0.0–149.0)
VLDL: 44.4 mg/dL — ABNORMAL HIGH (ref 0.0–40.0)

## 2016-02-20 LAB — HEMOGLOBIN A1C: Hgb A1c MFr Bld: 7.5 % — ABNORMAL HIGH (ref 4.6–6.5)

## 2016-02-20 NOTE — Progress Notes (Signed)
I reviewed health advisor's note, was available for consultation, and agree with documentation and plan.  

## 2016-02-20 NOTE — Progress Notes (Signed)
PCP notes:   Health maintenance:  Foot exam - will complete at CPE Shingles - will discuss with PCP considering hx of shingles Bone density - declined A1C - completed Hep C - completed Mammogram - per pt, not due until 03/2016  Abnormal screenings:   Hearing - failed  Patient concerns: Pt has multiple concerns: frequent headaches, pain over left eye, elevated AM blood glucose readings, bouts of constipation followed by diarrhea. Pt wants to discuss diabetes medications with regard to their effectiveness. Pt states she is also concerned about recent weight loss as this was not intentional. 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 Pt verbalized caregiver strain as she has been taking care of brother, husband, and handicapped son. Pt would like to discuss a neurology referral.   Nurse concerns: None. FYI: Pt did state she had numerous diabetic education courses and was aware of carb counting. Pt did state due to her schedule she does not always eat meals on a consistent basis.   Next PCP appt: 02/28/16 @1215 

## 2016-02-20 NOTE — Patient Instructions (Signed)
[name] , Thank you for taking time to come for your Medicare Wellness Visit. I appreciate your ongoing commitment to your health goals. Please review the following plan we discussed and let me know if I can assist you in the future.   These are the goals we discussed: Goals    . Increase physical activity     Starting 02/20/16, I will continue to exercise at gym at least 60 minutes as tolerated.        This is a list of the screening recommended for you and due dates:  Health Maintenance  Topic Date Due  . Complete foot exam   02/28/2016*  . Shingles Vaccine  02/19/2017*  . DEXA scan (bone density measurement)  02/20/2024*  . Flu Shot  03/04/2016  . Mammogram  04/03/2016  . Eye exam for diabetics  04/26/2016  . Hemoglobin A1C  08/22/2016  . Tetanus Vaccine  08/18/2017  . Colon Cancer Screening  03/25/2023  .  Hepatitis C: One time screening is recommended by Center for Disease Control  (CDC) for  adults born from [birth year] through 1963.   Completed  . Pneumonia vaccines  Completed  *Topic was postponed. The date shown is not the original due date.    Preventive Care for Adults  A healthy lifestyle and preventive care can promote health and wellness. Preventive health guidelines for adults include the following key practices.  . A routine yearly physical is a good way to check with your health care provider about your health and preventive screening. It is a chance to share any concerns and updates on your health and to receive a thorough exam.  . Visit your dentist for a routine exam and preventive care every 6 months. Brush your teeth twice a day and floss once a day. Good oral hygiene prevents tooth decay and gum disease.  . The frequency of eye exams is based on your age, health, family medical history, use  of contact lenses, and other factors. Follow your health care provider's ecommendations for frequency of eye exams.  . Eat a healthy diet. Foods like vegetables, fruits,  whole grains, low-fat dairy products, and lean protein foods contain the nutrients you need without too many calories. Decrease your intake of foods high in solid fats, added sugars, and salt. Eat the right amount of calories for you. Get information about a proper diet from your health care provider, if necessary.  . Regular physical exercise is one of the most important things you can do for your health. Most adults should get at least 150 minutes of moderate-intensity exercise (any activity that increases your heart rate and causes you to sweat) each week. In addition, most adults need muscle-strengthening exercises on 2 or more days a week.  Silver Sneakers may be a benefit available to you. To determine eligibility, you may visit the website: www.silversneakers.com or contact program at [phone] Mon-Fri between 8AM-8PM.   . Maintain a healthy weight. The body mass index (BMI) is a screening tool to identify possible weight problems. It provides an estimate of body fat based on height and weight. Your health care provider can find your BMI and can help you achieve or maintain a healthy weight.   For adults 20 years and older: ? A BMI below 18.5 is considered underweight. ? A BMI of 18.5 to 24.9 is normal. ? A BMI of 25 to 29.9 is considered overweight. ? A BMI of 30 and above is considered obese.   .Marland Kitchen  Maintain normal blood lipids and cholesterol levels by exercising and minimizing your intake of saturated fat. Eat a balanced diet with plenty of fruit and vegetables. Blood tests for lipids and cholesterol should begin at age 105 and be repeated every 5 years. If your lipid or cholesterol levels are high, you are over 50, or you are at high risk for heart disease, you may need your cholesterol levels checked more frequently. Ongoing high lipid and cholesterol levels should be treated with medicines if diet and exercise are not working.  . If you smoke, find out from your health care provider  how to quit. If you do not use tobacco, please do not start.  . If you choose to drink alcohol, please do not consume more than 2 drinks per day. One drink is considered to be 12 ounces (355 mL) of beer, 5 ounces (148 mL) of wine, or 1.5 ounces (44 mL) of liquor.  . If you are 27-70 years old, ask your health care provider if you should take aspirin to prevent strokes.  . Use sunscreen. Apply sunscreen liberally and repeatedly throughout the day. You should seek shade when your shadow is shorter than you. Protect yourself by wearing long sleeves, pants, a wide-brimmed hat, and sunglasses year round, whenever you are outdoors.  . Once a month, do a whole body skin exam, using a mirror to look at the skin on your back. Tell your health care provider of new moles, moles that have irregular borders, moles that are larger than a pencil eraser, or moles that have changed in shape or color.

## 2016-02-20 NOTE — Progress Notes (Signed)
Subjective:   Wanda [name] is a 70 y.o. female who presents for Medicare Annual (Subsequent) preventive examination.  Review of Systems:  N/A Cardiac Risk Factors include: advanced age (>74men, >38 women);diabetes mellitus;dyslipidemia;hypertension     Objective:     Vitals: BP 110/70 mmHg  Pulse 70  Temp(Src) 97.7 F (36.5 C) (Oral)  Ht 5' 4.25" (1.632 m)  Wt 139 lb 4 oz (63.163 kg)  BMI 23.71 kg/m2  SpO2 95%  Body mass index is 23.71 kg/(m^2).   Tobacco History  Smoking status  . Never Smoker   Smokeless tobacco  . Never Used     Counseling given: No   Past Medical History  Diagnosis Date  . DM (diabetes mellitus) ([place])   . Hyperlipidemia   . HTN (hypertension)   . Colon polyps    Past Surgical History  Procedure Laterality Date  . Wrist surgery  1969   Family History  Problem Relation Age of Onset  . Heart attack    . Hypertension Mother   . Hyperlipidemia Mother   . Stroke Mother    History  Sexual Activity  . Sexual Activity: No    Outpatient Encounter Prescriptions as of 02/20/2016  Medication Sig  . busPIRone (BUSPAR) 15 MG tablet Take 1 tablet (15 mg total) by mouth 2 (two) times daily. (Patient taking differently: Take 15 mg by mouth daily. )  . FREESTYLE LITE test strip USE AS INSTRUCTED DAILY.  Marland Kitchen GLIPIZIDE XL 5 MG 24 hr tablet TAKE 1 TABLET (5 MG TOTAL) BY MOUTH DAILY.  Marland Kitchen JANUVIA 100 MG tablet TAKE 1 TABLET BY MOUTH ONCE DAILY.  Marland Kitchen lisinopril (PRINIVIL,ZESTRIL) 10 MG tablet TAKE 1 TABLET BY MOUTH ONCE A DAY.  . metFORMIN (GLUCOPHAGE-XR) 500 MG 24 hr tablet TAKE 2 TABLETS BY MOUTH DAILY WITH BREAKFAST and one tablet in the evening.  Marland Kitchen omeprazole (PRILOSEC) 20 MG capsule Take 1 capsule (20 mg total) by mouth daily as needed.  . pravastatin (PRAVACHOL) 10 MG tablet TAKE 1 TABLET BY MOUTH DAILY.   No facility-administered encounter medications on file as of 02/20/2016.    Activities of Daily Living In your present state of health, do you  have any difficulty performing the following activities: 02/20/2016  Hearing? N  Vision? N  Difficulty concentrating or making decisions? N  Walking or climbing stairs? N  Dressing or bathing? N  Doing errands, shopping? N  Preparing Food and eating ? N  Using the Toilet? N  In the past six months, have you accidently leaked urine? N  Do you have problems with loss of bowel control? Y  Managing your Medications? N  Managing your Finances? N  Housekeeping or managing your Housekeeping? N    Patient Care Team: Lucille Passy, MD as PCP - General Katy Apo, MD as Consulting Physician (Ophthalmology)    Assessment:     Hearing Screening   125Hz  250Hz  500Hz  1000Hz  2000Hz  4000Hz  8000Hz   Right ear:   0 0 0 0   Left ear:   0 0 40 0   Vision Screening Comments: Last vision exam with Dr. Prudencio Burly on 04/27/2015   Exercise Activities and Dietary recommendations Current Exercise Habits: Home exercise routine, Type of exercise: Other - see comments (stationary bike), Time (Minutes): 60, Frequency (Times/Week): 1 (pt states she is not consistent with frequency of exercise), Weekly Exercise (Minutes/Week): 60, Intensity: Moderate  Goals    . Increase physical activity     Starting 02/20/16, I will continue to  exercise at gym at least 60 minutes as tolerated.       Fall Risk Fall Risk  02/20/2016 01/25/2014  Falls in the past year? No No   Depression Screen PHQ 2/9 Scores 02/20/2016 01/25/2014  PHQ - 2 Score 0 0     Cognitive Testing MMSE - Mini Mental State Exam 02/20/2016  Orientation to time 5  Orientation to Place 5  Registration 3  Attention/ Calculation 0  Recall 3  Language- name 2 objects 0  Language- repeat 1  Language- follow 3 step command 3  Language- read & follow direction 0  Write a sentence 0  Copy design 0  Total score 20   PLEASE NOTE: A Mini-Cog screen was completed. Maximum score is 20. A value of 0 denotes this part of Folstein MMSE was not completed or the  patient failed this part of the Mini-Cog screening.   Mini-Cog Screening Orientation to Time - Max 5 pts Orientation to Place - Max 5 pts Registration - Max 3 pts Recall - Max 3 pts Language Repeat - Max 1 pts Language Follow 3 Step Command - Max 3 pts   Immunization History  Administered Date(s) Administered  . Influenza Split 06/03/2012  . Influenza,inj,Quad PF,36+ Mos 08/10/2013, 05/03/2014  . PPD Test 04/19/2013  . Pneumococcal Conjugate-13 10/02/2014  . Pneumococcal Polysaccharide-23 04/25/2013  . Td 08/19/2007   Screening Tests Health Maintenance  Topic Date Due  . FOOT EXAM  02/28/2016 (Originally 02/14/2016)  . ZOSTAVAX  02/19/2017 (Originally 11/08/2007)  . DEXA SCAN  02/20/2024 (Originally 11/07/2012)  . INFLUENZA VACCINE  03/04/2016  . MAMMOGRAM  04/03/2016  . OPHTHALMOLOGY EXAM  04/26/2016  . HEMOGLOBIN A1C  08/22/2016  . TETANUS/TDAP  08/18/2017  . COLONOSCOPY  03/25/2023  . Hepatitis C Screening  Completed  . PNA vac Low Risk Adult  Completed      Plan:     I have personally reviewed and addressed the Medicare Annual Wellness questionnaire and have noted the following in the patient's chart:  A. Medical and social history B. Use of alcohol, tobacco or illicit drugs  C. Current medications and supplements D. Functional ability and status E.  Nutritional status F.  Physical activity G. Advance directives H. List of other physicians I.  Hospitalizations, surgeries, and ER visits in previous 12 months J.  [place] to include hearing, vision, cognitive, depression L. Referrals and appointments - none  In addition, I have reviewed and discussed with patient certain preventive protocols, quality metrics, and best practice recommendations. A written personalized care plan for preventive services as well as general preventive health recommendations were provided to patient.  See attached scanned questionnaire for additional information.   Signed,    Lindell Noe, MHA, BS, LPN Health Advisor

## 2016-02-20 NOTE — Progress Notes (Signed)
Pre visit review using our clinic review tool, if applicable. No additional management support is needed unless otherwise documented below in the visit note. 

## 2016-02-21 LAB — HEPATITIS C ANTIBODY: HCV Ab: NEGATIVE

## 2016-02-28 ENCOUNTER — Ambulatory Visit (INDEPENDENT_AMBULATORY_CARE_PROVIDER_SITE_OTHER): Payer: Medicare Other | Admitting: Family Medicine

## 2016-02-28 ENCOUNTER — Encounter: Payer: Self-pay | Admitting: Family Medicine

## 2016-02-28 VITALS — BP 122/68 | HR 72 | Temp 97.8°F | Ht 64.25 in | Wt 140.8 lb

## 2016-02-28 DIAGNOSIS — Z0001 Encounter for general adult medical examination with abnormal findings: Secondary | ICD-10-CM

## 2016-02-28 DIAGNOSIS — Z01419 Encounter for gynecological examination (general) (routine) without abnormal findings: Secondary | ICD-10-CM

## 2016-02-28 DIAGNOSIS — N183 Chronic kidney disease, stage 3 unspecified: Secondary | ICD-10-CM

## 2016-02-28 DIAGNOSIS — E785 Hyperlipidemia, unspecified: Secondary | ICD-10-CM | POA: Diagnosis not present

## 2016-02-28 DIAGNOSIS — R519 Headache, unspecified: Secondary | ICD-10-CM | POA: Insufficient documentation

## 2016-02-28 DIAGNOSIS — R6889 Other general symptoms and signs: Secondary | ICD-10-CM | POA: Diagnosis not present

## 2016-02-28 DIAGNOSIS — Z636 Dependent relative needing care at home: Secondary | ICD-10-CM

## 2016-02-28 DIAGNOSIS — E1121 Type 2 diabetes mellitus with diabetic nephropathy: Secondary | ICD-10-CM

## 2016-02-28 DIAGNOSIS — R51 Headache: Secondary | ICD-10-CM

## 2016-02-28 NOTE — Assessment & Plan Note (Signed)
Discussed tx options but she wants to consider going back to work at this time. Declined psychotherapy and medical adjustment.

## 2016-02-28 NOTE — Assessment & Plan Note (Signed)
Reviewed preventive care protocols, scheduled due services, and updated immunizations Discussed nutrition, exercise, diet, and healthy lifestyle.  

## 2016-02-28 NOTE — Assessment & Plan Note (Signed)
Improving control. Discussed at length.  She does not want to change rxs but want to try cutting out late night sweets. Follow up in 3 months.

## 2016-02-28 NOTE — Progress Notes (Signed)
Subjective:   Patient ID: Wanda [name], female    DOB: Mar 10, 1946, 71 y.o.   MRN: [id number]  Wanda [name] is a pleasant 70 y.o. year old female who presents to clinic today with Annual Exam; Eye Pain; Headache; and Diarrhea (following constipation) and follow up of chronic medical conditions on 02/28/2016  HPI:  Annual medicare wellness visit with Candis Musa, RN on 02/20/16.  Notes reviewed.  Patient concerns: Pt has multiple concerns: frequent headaches, pain over left eye, elevated AM blood glucose readings, bouts of constipation followed by diarrhea. Pt wants to discuss diabetes medications with regard to their effectiveness. Pt states she is also concerned about recent weight loss as this was not intentional. 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 Pt verbalized caregiver strain as she has been taking care of brother, husband, and handicapped son. Pt would like to discuss a neurology referral.   Pain above left eye- comes and goes.  Has been to the eye doctor who told her that "nothing was wrong" Dull ache is intermittent.  No visual changes.  Nothing seems to make it better or worse.  Has been ongoing for over a year.  DM II- diagnosed over 10 years ago.    On ACEI. Pneumovax 04/25/13 Prevnar 13 10/02/14 Renal function improved.  Lab Results  Component Value Date   HGBA1C 7.5 (H) 02/20/2016   On Metformin 1000 mg daily (cannot seem to remember 2nd dose Metformin), Januvia 100 mg daily, Glipizide 5 mg daily. Admits to not checking FSBS routinely. Having elevated readings in the morning. a1c improving but not quite at goal.  Does admit to eating sweets at 11 pm.  Unintentional weight loss- she thought it was odd that she has lost a few pounds since April. Admits to not having enough time to eat at times because she is caregiver for her husband who has lewy body dementia.  Wt Readings from Last 3 Encounters:  02/28/16 140 lb 12 oz (63.8  kg)  02/20/16 139 lb 4 oz (63.2 kg)  11/19/15 143 lb 4 oz (65 kg)    Caregiver stress- situation with her husband's health is getting worse.  She doesn't want to see a counselor.  Does not feel depressed.  Feels that if she went back to work part time, she would feel better.  HLD- LDL  at goal.   Lab Results  Component Value Date   CHOL 159 02/20/2016   HDL 31.20 (L) 02/20/2016   LDLCALC 83 08/16/2015   LDLDIRECT 86.0 02/20/2016   TRIG 222.0 (H) 02/20/2016   CHOLHDL 5 02/20/2016    Wt Readings from Last 3 Encounters:  02/28/16 140 lb 12 oz (63.8 kg)  02/20/16 139 lb 4 oz (63.2 kg)  11/19/15 143 lb 4 oz (65 kg)     CKD III- due to diabetes, HTN- has seen Dr. Candiss Norse. GFR and CR now normal. Lab Results  Component Value Date   CREATININE 1.12 02/20/2016     Lab Results  Component Value Date   NA 142 02/20/2016   K 4.5 02/20/2016   CL 109 02/20/2016   CO2 25 02/20/2016    HTN- at goal for diabetic. On Lisinopril 10 mg daily. No CP, SOB, blurred vision or headaches.  Lab Results  Component Value Date   CREATININE 1.12 02/20/2016    Current Outpatient Prescriptions on File Prior to Visit  Medication Sig Dispense Refill  . busPIRone (BUSPAR) 15 MG tablet Take 1 tablet (15  mg total) by mouth 2 (two) times daily. (Patient taking differently: Take 15 mg by mouth daily. ) 60 tablet 3  . FREESTYLE LITE test strip USE AS INSTRUCTED DAILY. 50 each 5  . GLIPIZIDE XL 5 MG 24 hr tablet TAKE 1 TABLET (5 MG TOTAL) BY MOUTH DAILY. 90 tablet 0  . JANUVIA 100 MG tablet TAKE 1 TABLET BY MOUTH ONCE DAILY. 90 tablet 3  . lisinopril (PRINIVIL,ZESTRIL) 10 MG tablet TAKE 1 TABLET BY MOUTH ONCE A DAY. 90 tablet 3  . metFORMIN (GLUCOPHAGE-XR) 500 MG 24 hr tablet TAKE 2 TABLETS BY MOUTH DAILY WITH BREAKFAST and one tablet in the evening. 180 tablet 6  . omeprazole (PRILOSEC) 20 MG capsule Take 1 capsule (20 mg total) by mouth daily as needed. 90 capsule 3  . pravastatin (PRAVACHOL) 10 MG  tablet TAKE 1 TABLET BY MOUTH DAILY. 90 tablet 0   No current facility-administered medications on file prior to visit.     Allergies  Allergen Reactions  . Niacin     REACTION: unbearable hot flashes    Past Medical History:  Diagnosis Date  . Colon polyps   . DM (diabetes mellitus) ([place])   . HTN (hypertension)   . Hyperlipidemia     Past Surgical History:  Procedure Laterality Date  . WRIST SURGERY  1969    Family History  Problem Relation Age of Onset  . Heart attack    . Hypertension Mother   . Hyperlipidemia Mother   . Stroke Mother     Social History   Social History  . Marital status: Married    Spouse name: N/A  . Number of children: N/A  . Years of education: N/A   Occupational History  . Not on file.   Social History Main Topics  . Smoking status: Never Smoker  . Smokeless tobacco: Never Used  . Alcohol use No  . Drug use: No  . Sexual activity: No   Other Topics Concern  . Not on file   Social History Narrative  . No narrative on file   The PMH, PSH, Social History, Family History, Medications, and allergies have been reviewed in [facility], and have been updated if relevant.  Review of Systems  Constitutional: Negative.   HENT: Negative for congestion, postnasal drip and sinus pressure.   Eyes: Negative.   Respiratory: Negative.  Negative for cough, choking and chest tightness.   Cardiovascular: Negative.   Gastrointestinal: Negative.   Endocrine: Negative.   Genitourinary: Negative.   Musculoskeletal: Negative.   Skin: Negative.   Neurological: Negative.   Hematological: Negative.   Psychiatric/Behavioral: Negative.        Objective:    BP 122/68   Pulse 72   Temp 97.8 F (36.6 C) (Oral)   Ht 5' 4.25" (1.632 m)   Wt 140 lb 12 oz (63.8 kg)   SpO2 99%   BMI 23.97 kg/m   Wt Readings from Last 3 Encounters:  02/28/16 140 lb 12 oz (63.8 kg)  02/20/16 139 lb 4 oz (63.2 kg)  11/19/15 143 lb 4 oz (65 kg)    Physical Exam    Constitutional: She is oriented to person, place, and time. She appears well-developed and well-nourished. No distress.  HENT:  Head: Normocephalic.  Right Ear: Hearing and tympanic membrane normal.  Left Ear: Hearing and tympanic membrane normal.  Nose: Rhinorrhea present. No sinus tenderness, nasal deformity, septal deviation or nasal septal hematoma. No epistaxis.  No foreign bodies. Right sinus exhibits  no maxillary sinus tenderness and no frontal sinus tenderness. Left sinus exhibits no maxillary sinus tenderness and no frontal sinus tenderness.  Eyes: Conjunctivae are normal.  Neck: Normal range of motion.  Cardiovascular: Normal rate and regular rhythm.   Pulmonary/Chest: Effort normal and breath sounds normal.  Neurological: She is alert and oriented to person, place, and time. No cranial nerve deficit.  Skin: Skin is warm and dry. No rash noted.  Psychiatric: She has a normal mood and affect. Her behavior is normal. Judgment and thought content normal.  Nursing note and vitals reviewed.         Assessment & Plan:   Well woman exam  HLD (hyperlipidemia)  CKD (chronic kidney disease) stage 3, GFR 30-59 ml/min  Type 2 diabetes mellitus with diabetic nephropathy, without long-term current use of insulin ([place]) No Follow-up on file.

## 2016-02-28 NOTE — Assessment & Plan Note (Signed)
Persistent. Refer to neuro. The patient indicates understanding of these issues and agrees with the plan.

## 2016-02-28 NOTE — Patient Instructions (Addendum)
Great to see you.  I have referred you to a neurologist.  We will call you with you an appointment.  Please stop eating sweets late at night. Continue to keep working out. Let's recheck your a1c in 3 months.   Let's trying adding a diabetic shake like glucerna.

## 2016-02-28 NOTE — Progress Notes (Signed)
Pre visit review using our clinic review tool, if applicable. No additional management support is needed unless otherwise documented below in the visit note. 

## 2016-02-28 NOTE — Assessment & Plan Note (Signed)
At goal for a diabetic. No changes made today. 

## 2016-03-19 ENCOUNTER — Ambulatory Visit (INDEPENDENT_AMBULATORY_CARE_PROVIDER_SITE_OTHER): Payer: Medicare Other | Admitting: Diagnostic Neuroimaging

## 2016-03-19 ENCOUNTER — Encounter: Payer: Self-pay | Admitting: Diagnostic Neuroimaging

## 2016-03-19 VITALS — BP 116/66 | HR 70 | Ht 64.5 in | Wt 143.6 lb

## 2016-03-19 DIAGNOSIS — R51 Headache: Secondary | ICD-10-CM

## 2016-03-19 DIAGNOSIS — R29898 Other symptoms and signs involving the musculoskeletal system: Secondary | ICD-10-CM

## 2016-03-19 DIAGNOSIS — H5712 Ocular pain, left eye: Secondary | ICD-10-CM | POA: Diagnosis not present

## 2016-03-19 DIAGNOSIS — R413 Other amnesia: Secondary | ICD-10-CM | POA: Diagnosis not present

## 2016-03-19 DIAGNOSIS — R519 Headache, unspecified: Secondary | ICD-10-CM

## 2016-03-19 DIAGNOSIS — F411 Generalized anxiety disorder: Secondary | ICD-10-CM | POA: Diagnosis not present

## 2016-03-19 DIAGNOSIS — M2141 Flat foot [pes planus] (acquired), right foot: Secondary | ICD-10-CM

## 2016-03-19 NOTE — Patient Instructions (Signed)

## 2016-03-19 NOTE — Progress Notes (Signed)
[facility]  PATIENT: Wanda [name] DOB: Oct 23, 1945  REFERRING CLINICIAN: Bjorn Loser, MD HISTORY FROM: patient  REASON FOR VISIT: new consult    HISTORICAL  CHIEF COMPLAINT:  Chief Complaint  Patient presents with  . Headache    rm 7, New Pt, "recurrent HA and pain over L eye x 1 year; walking issues- R foot stlops x 1 month"    HISTORY OF PRESENT ILLNESS:    70 year old female right-handed female with diabetes, hyperglycemia, fibromyalgia, here for evaluation of headaches and eye pain.  Patient has had low-grade headache for the past one year over the left frontal and left forehead region, behind her left eye. Sometimes she has left eye pain as a fine-needle is poking her eye. Triggers include increased stress. Tylenol seems to help. These happen 3-4 times per week, lasting hours at a time. No nausea vomiting. Some photophobia. Some pressure and throbbing sensation. Patient has seen eye doctor for evaluation with no specific diagnosis.  Separately patient has noted that her right foot "catches" when she is walking. The specific triggering or aggravating factors. No consistency of symptom. No significant low back pain or right leg pain.   REVIEW OF SYSTEMS: Full 14 system review of systems performed and negative with exception of: Diarrhea constipation fatigue eye pain feeling cold headache.  ALLERGIES: Allergies  Allergen Reactions  . Niacin     REACTION: unbearable hot flashes    HOME MEDICATIONS: Outpatient Medications Prior to Visit  Medication Sig Dispense Refill  . busPIRone (BUSPAR) 15 MG tablet Take 1 tablet (15 mg total) by mouth 2 (two) times daily. (Patient taking differently: Take 15 mg by mouth daily. ) 60 tablet 3  . FREESTYLE LITE test strip USE AS INSTRUCTED DAILY. 50 each 5  . GLIPIZIDE XL 5 MG 24 hr tablet TAKE 1 TABLET (5 MG TOTAL) BY MOUTH DAILY. 90 tablet 0  . JANUVIA 100 MG tablet TAKE 1 TABLET BY MOUTH ONCE DAILY. 90 tablet 3  .  lisinopril (PRINIVIL,ZESTRIL) 10 MG tablet TAKE 1 TABLET BY MOUTH ONCE A DAY. 90 tablet 3  . metFORMIN (GLUCOPHAGE-XR) 500 MG 24 hr tablet TAKE 2 TABLETS BY MOUTH DAILY WITH BREAKFAST and one tablet in the evening. 180 tablet 6  . omeprazole (PRILOSEC) 20 MG capsule Take 1 capsule (20 mg total) by mouth daily as needed. 90 capsule 3  . pravastatin (PRAVACHOL) 10 MG tablet TAKE 1 TABLET BY MOUTH DAILY. 90 tablet 0   No facility-administered medications prior to visit.     PAST MEDICAL HISTORY: Past Medical History:  Diagnosis Date  . Colon polyps   . DM (diabetes mellitus) ([place])   . Fibromyalgia    "neurofibromyalgia"  . HTN (hypertension)   . Hyperlipidemia     PAST SURGICAL HISTORY: Past Surgical History:  Procedure Laterality Date  . WRIST SURGERY  1969    FAMILY HISTORY: Family History  Problem Relation Age of Onset  . Heart attack    . Hypertension Mother   . Hyperlipidemia Mother   . Stroke Mother   . Diabetes Sister   . Parkinson's disease Brother     SOCIAL HISTORY:  Social History   Social History  . Marital status: Married    Spouse name: N/A  . Number of children: 2  . Years of education: 16   Occupational History  .      retired Pharmacist, hospital   Social History Main Topics  . Smoking status: Never Smoker  .  Smokeless tobacco: Never Used  . Alcohol use No  . Drug use: No  . Sexual activity: No   Other Topics Concern  . Not on file   Social History Narrative   Lives with husband    caffeine - none     PHYSICAL EXAM  GENERAL EXAM/CONSTITUTIONAL: Vitals:  Vitals:   03/19/16 1105  BP: 116/66  Pulse: 70  Weight: 143 lb 9.6 oz (65.1 kg)  Height: 5' 4.5" (1.638 m)     Body mass index is 24.27 kg/m.  No exam data present  Patient is in no distress; well developed, nourished and groomed; neck is supple  CARDIOVASCULAR:  Examination of carotid arteries is normal; no carotid bruits  Regular rate and rhythm, no murmurs  Examination of  peripheral vascular system by observation and palpation is normal  EYES:  Ophthalmoscopic exam of optic discs and posterior segments is normal; no papilledema or hemorrhages  MUSCULOSKELETAL:  Gait, strength, tone, movements noted in Neurologic exam below  NEUROLOGIC: MENTAL STATUS:  MMSE - Mini Mental State Exam 02/20/2016  Orientation to time 5  Orientation to Place 5  Registration 3  Attention/ Calculation 0  Recall 3  Language- name 2 objects 0  Language- repeat 1  Language- follow 3 step command 3  Language- read & follow direction 0  Write a sentence 0  Copy design 0  Total score 20    awake, alert, oriented to person, place and time  recent and remote memory intact  normal attention and concentration  language fluent, comprehension intact, naming intact,   fund of knowledge appropriate  CRANIAL NERVE:   2nd - no papilledema on fundoscopic exam; BILATERAL CATARACTS  2nd, 3rd, 4th, 6th - pupils equal and reactive to light, visual fields full to confrontation, extraocular muscles intact, no nystagmus  5th - facial sensation symmetric  7th - facial strength symmetric  8th - hearing intact  9th - palate elevates symmetrically, uvula midline  11th - shoulder shrug symmetric  12th - tongue protrusion midline  MOTOR:   normal bulk and tone, full strength in the BUE, BLE  SENSORY:   normal and symmetric to light touch, temperature, vibration  COORDINATION:   finger-nose-finger, fine finger movements normal  REFLEXES:   deep tendon reflexes present and symmetric; TRACE AT ANKLES  GAIT/STATION:   narrow based gait; able to walk on toes, heels and tandem; romberg is negative    DIAGNOSTIC DATA (LABS, IMAGING, TESTING) - I reviewed patient records, labs, notes, testing and imaging myself where available.  Lab Results  Component Value Date   WBC 6.9 02/20/2016   HGB 11.6 (L) 02/20/2016   HCT 35.2 (L) 02/20/2016   MCV 92.3 02/20/2016   PLT  244.0 02/20/2016      Component Value Date/Time   NA 142 02/20/2016 1017   K 4.5 02/20/2016 1017   CL 109 02/20/2016 1017   CO2 25 02/20/2016 1017   GLUCOSE 114 (H) 02/20/2016 1017   BUN 22 02/20/2016 1017   CREATININE 1.12 02/20/2016 1017   CALCIUM 9.4 02/20/2016 1017   PROT 7.2 02/20/2016 1017   ALBUMIN 4.1 02/20/2016 1017   AST 16 02/20/2016 1017   ALT 16 02/20/2016 1017   ALKPHOS 72 02/20/2016 1017   BILITOT 0.4 02/20/2016 1017   GFRNONAA 57 (L) 06/23/2014 1520   GFRAA 67 (L) 06/23/2014 1520   Lab Results  Component Value Date   CHOL 159 02/20/2016   HDL 31.20 (L) 02/20/2016   LDLCALC 83  08/16/2015   LDLDIRECT 86.0 02/20/2016   TRIG 222.0 (H) 02/20/2016   CHOLHDL 5 02/20/2016   Lab Results  Component Value Date   HGBA1C 7.5 (H) 02/20/2016   No results found for: VITAMINB12 Lab Results  Component Value Date   TSH 1.27 02/20/2016    08/06/10 xray cervical spine - No evidence of acute abnormality. - Mild multilevel facet arthropathy and mild degenerative disc disease at C6-7.    ASSESSMENT AND PLAN  70 y.o. year old female here with new onset headache, left eye pain, for past 1 year. Based on signs and symptoms, further workup is indicated.  Ddx: secondary headache (vascular, inflamm, infarct), stress, tension HA  1. New onset of headaches after age 30   2. Left eye pain   3. Weakness of right foot   4. Memory loss   5. Anxiety state      PLAN: - additional workup  Orders Placed This Encounter  Procedures  . MR Brain Wo Contrast  . C-reactive Protein  . Sedimentation Rate  . Vitamin B12   Return in about 6 weeks (around 04/30/2016).  I reviewed images, labs, notes, records myself. I summarized findings and reviewed with patient, for this high risk condition (new onset headache, intermittent right foot weakness; possible stroke or other CNS etiology) requiring high complexity decision making.    Penni Bombard, MD [id number], [id number]  AM Certified in Neurology, Neurophysiology and Neuroimaging  [facility] [address], [place], [place] [address] [phone]

## 2016-03-20 ENCOUNTER — Telehealth: Payer: Self-pay | Admitting: *Deleted

## 2016-03-20 LAB — VITAMIN B12: VITAMIN B 12: 242 pg/mL (ref 211–946)

## 2016-03-20 LAB — SEDIMENTATION RATE: SED RATE: 5 mm/h (ref 0–40)

## 2016-03-20 LAB — C-REACTIVE PROTEIN: CRP: 1.1 mg/L (ref 0.0–4.9)

## 2016-03-20 NOTE — Telephone Encounter (Signed)
Attempted to reach patient re: lab results. Unable to LVM, mailbox not set up.  Will try to reach patient tomorrow.

## 2016-03-21 NOTE — Telephone Encounter (Signed)
Spoke with patient and informed her that her lab results are good. She questioned if she should go ahead and have MRI; advised that she should, and she will get a call with those results when available. She verbalized understanding, agreement, appreciation for the call.

## 2016-03-27 ENCOUNTER — Telehealth: Payer: Self-pay | Admitting: *Deleted

## 2016-03-27 NOTE — Telephone Encounter (Signed)
Per Dr Leta Baptist, spoke with patient and informed her that her lab results are unremarkable. She does have a borderline low normal B12. Advised she may start multi-vitamin and follow up with PCP. Dr Leta Baptist will continue with her current plan. Advised she will get a call with MRI results when available. She verbalized understanding, appreciation of call.

## 2016-03-31 ENCOUNTER — Ambulatory Visit
Admission: RE | Admit: 2016-03-31 | Discharge: 2016-03-31 | Disposition: A | Discharge status: Home / Self Care | Payer: Medicare Other | Source: Ambulatory Visit | Attending: Diagnostic Neuroimaging | Admitting: Diagnostic Neuroimaging

## 2016-03-31 DIAGNOSIS — H5712 Ocular pain, left eye: Secondary | ICD-10-CM

## 2016-03-31 DIAGNOSIS — R519 Headache, unspecified: Secondary | ICD-10-CM

## 2016-03-31 DIAGNOSIS — R29898 Other symptoms and signs involving the musculoskeletal system: Secondary | ICD-10-CM

## 2016-03-31 DIAGNOSIS — R51 Headache: Secondary | ICD-10-CM | POA: Diagnosis not present

## 2016-04-08 ENCOUNTER — Telehealth: Payer: Self-pay | Admitting: *Deleted

## 2016-04-08 NOTE — Telephone Encounter (Signed)
Per Dr Leta Baptist, spoke with patient and informed her that her MRI results are unremarkable. Reminded her of her FU 04/29/16 at which time Dr Leta Baptist will further discuss labs, MRI and other recommendations. She verbalized understanding, appreciation for call.

## 2016-04-23 ENCOUNTER — Encounter: Payer: Self-pay | Admitting: Diagnostic Neuroimaging

## 2016-04-23 ENCOUNTER — Ambulatory Visit (INDEPENDENT_AMBULATORY_CARE_PROVIDER_SITE_OTHER): Payer: Medicare Other | Admitting: Diagnostic Neuroimaging

## 2016-04-23 VITALS — BP 128/86 | HR 76 | Wt 144.4 lb

## 2016-04-23 DIAGNOSIS — M2141 Flat foot [pes planus] (acquired), right foot: Secondary | ICD-10-CM

## 2016-04-23 DIAGNOSIS — R413 Other amnesia: Secondary | ICD-10-CM

## 2016-04-23 DIAGNOSIS — R51 Headache: Secondary | ICD-10-CM | POA: Diagnosis not present

## 2016-04-23 DIAGNOSIS — R29898 Other symptoms and signs involving the musculoskeletal system: Secondary | ICD-10-CM

## 2016-04-23 DIAGNOSIS — R519 Headache, unspecified: Secondary | ICD-10-CM

## 2016-04-23 NOTE — Progress Notes (Signed)
[facility]  PATIENT: Wanda [name] DOB: 1946-03-01  REFERRING CLINICIAN: Bjorn Loser, MD HISTORY FROM: patient  REASON FOR VISIT: follow up   HISTORICAL  CHIEF COMPLAINT:  Chief Complaint  Patient presents with  . Headache    rm 6, "maybe not as many headaches"  . Follow-up    1 month    HISTORY OF PRESENT ILLNESS:   UPDATE 04/23/16: Since last visit, doing about the same. HA are likely stress related per patient. Occ right foot catching still. Stress and depression and anxiety continue, but mild.   PRIOR HPI (03/19/16): 70 year old female right-handed female with diabetes, hyperglycemia, fibromyalgia, here for evaluation of headaches and eye pain. Patient has had low-grade headache for the past one year over the left frontal and left forehead region, behind her left eye. Sometimes she has left eye pain as a fine-needle is poking her eye. Triggers include increased stress. Tylenol seems to help. These happen 3-4 times per week, lasting hours at a time. No nausea vomiting. Some photophobia. Some pressure and throbbing sensation. Patient has seen eye doctor for evaluation with no specific diagnosis. Separately patient has noted that her right foot "catches" when she is walking. The specific triggering or aggravating factors. No consistency of symptom. No significant low back pain or right leg pain.   REVIEW OF SYSTEMS: Full 14 system review of systems performed and negative with exception of: Diarrhea constipation fatigue eye pain feeling cold headache.  ALLERGIES: Allergies  Allergen Reactions  . Niacin     REACTION: unbearable hot flashes    HOME MEDICATIONS: Outpatient Medications Prior to Visit  Medication Sig Dispense Refill  . busPIRone (BUSPAR) 15 MG tablet Take 1 tablet (15 mg total) by mouth 2 (two) times daily. (Patient taking differently: Take 15 mg by mouth daily. ) 60 tablet 3  . FREESTYLE LITE test strip USE AS INSTRUCTED DAILY. 50 each 5  .  GLIPIZIDE XL 5 MG 24 hr tablet TAKE 1 TABLET (5 MG TOTAL) BY MOUTH DAILY. 90 tablet 0  . JANUVIA 100 MG tablet TAKE 1 TABLET BY MOUTH ONCE DAILY. 90 tablet 3  . lisinopril (PRINIVIL,ZESTRIL) 10 MG tablet TAKE 1 TABLET BY MOUTH ONCE A DAY. 90 tablet 3  . metFORMIN (GLUCOPHAGE-XR) 500 MG 24 hr tablet TAKE 2 TABLETS BY MOUTH DAILY WITH BREAKFAST and one tablet in the evening. 180 tablet 6  . omeprazole (PRILOSEC) 20 MG capsule Take 1 capsule (20 mg total) by mouth daily as needed. 90 capsule 3  . pravastatin (PRAVACHOL) 10 MG tablet TAKE 1 TABLET BY MOUTH DAILY. 90 tablet 0   No facility-administered medications prior to visit.     PAST MEDICAL HISTORY: Past Medical History:  Diagnosis Date  . Colon polyps   . DM (diabetes mellitus) ([place])   . Fibromyalgia    "neurofibromyalgia"  . Headache   . HTN (hypertension)   . Hyperlipidemia     PAST SURGICAL HISTORY: Past Surgical History:  Procedure Laterality Date  . WRIST SURGERY  1969    FAMILY HISTORY: Family History  Problem Relation Age of Onset  . Heart attack    . Hypertension Mother   . Hyperlipidemia Mother   . Stroke Mother   . Diabetes Sister   . Parkinson's disease Brother     SOCIAL HISTORY:  Social History   Social History  . Marital status: Married    Spouse name: N/A  . Number of children: 2  . Years of education: 74  Occupational History  .      retired Pharmacist, hospital   Social History Main Topics  . Smoking status: Never Smoker  . Smokeless tobacco: Never Used  . Alcohol use No  . Drug use: No  . Sexual activity: No   Other Topics Concern  . Not on file   Social History Narrative   Lives with husband    caffeine - none     PHYSICAL EXAM  GENERAL EXAM/CONSTITUTIONAL: Vitals:  Vitals:   04/23/16 1457  BP: 128/86  Pulse: 76  Weight: 144 lb 6.4 oz (65.5 kg)   Body mass index is 24.4 kg/m. No exam data present  Patient is in no distress; well developed, nourished and groomed; neck is  supple  CARDIOVASCULAR:  Examination of carotid arteries is normal; no carotid bruits  Regular rate and rhythm, no murmurs  Examination of peripheral vascular system by observation and palpation is normal  EYES:  Ophthalmoscopic exam of optic discs and posterior segments is normal; no papilledema or hemorrhages  MUSCULOSKELETAL:  Gait, strength, tone, movements noted in Neurologic exam below  NEUROLOGIC: MENTAL STATUS:  MMSE - Mini Mental State Exam 02/20/2016  Orientation to time 5  Orientation to Place 5  Registration 3  Attention/ Calculation 0  Recall 3  Language- name 2 objects 0  Language- repeat 1  Language- follow 3 step command 3  Language- read & follow direction 0  Write a sentence 0  Copy design 0  Total score 20    awake, alert, oriented to person, place and time  recent and remote memory intact  normal attention and concentration  language fluent, comprehension intact, naming intact,   fund of knowledge appropriate  CRANIAL NERVE:   2nd - no papilledema on fundoscopic exam; BILATERAL CATARACTS  2nd, 3rd, 4th, 6th - pupils equal and reactive to light, visual fields full to confrontation, extraocular muscles intact, no nystagmus  5th - facial sensation symmetric  7th - facial strength symmetric  8th - hearing intact  9th - palate elevates symmetrically, uvula midline  11th - shoulder shrug symmetric  12th - tongue protrusion midline  MOTOR:   normal bulk and tone, full strength in the BUE, BLE  SENSORY:   normal and symmetric to light touch, temperature, vibration  COORDINATION:   finger-nose-finger, fine finger movements normal  REFLEXES:   deep tendon reflexes present and symmetric; TRACE AT ANKLES  GAIT/STATION:   narrow based gait; able to walk on heels and tandem; romberg is negative    DIAGNOSTIC DATA (LABS, IMAGING, TESTING) - I reviewed patient records, labs, notes, testing and imaging myself where  available.  Lab Results  Component Value Date   WBC 6.9 02/20/2016   HGB 11.6 (L) 02/20/2016   HCT 35.2 (L) 02/20/2016   MCV 92.3 02/20/2016   PLT 244.0 02/20/2016      Component Value Date/Time   NA 142 02/20/2016 1017   K 4.5 02/20/2016 1017   CL 109 02/20/2016 1017   CO2 25 02/20/2016 1017   GLUCOSE 114 (H) 02/20/2016 1017   BUN 22 02/20/2016 1017   CREATININE 1.12 02/20/2016 1017   CALCIUM 9.4 02/20/2016 1017   PROT 7.2 02/20/2016 1017   ALBUMIN 4.1 02/20/2016 1017   AST 16 02/20/2016 1017   ALT 16 02/20/2016 1017   ALKPHOS 72 02/20/2016 1017   BILITOT 0.4 02/20/2016 1017   GFRNONAA 57 (L) 06/23/2014 1520   GFRAA 67 (L) 06/23/2014 1520   Lab Results  Component Value Date  CHOL 159 02/20/2016   HDL 31.20 (L) 02/20/2016   LDLCALC 83 08/16/2015   LDLDIRECT 86.0 02/20/2016   TRIG 222.0 (H) 02/20/2016   CHOLHDL 5 02/20/2016   Lab Results  Component Value Date   HGBA1C 7.5 (H) 02/20/2016   Lab Results  Component Value Date   VITAMINB12 242 03/19/2016   Lab Results  Component Value Date   TSH 1.27 02/20/2016   Lab Results  Component Value Date   ESRSEDRATE 5 03/19/2016   Lab Results  Component Value Date   CRP 1.1 03/19/2016    08/06/10 xray cervical spine - No evidence of acute abnormality. - Mild multilevel facet arthropathy and mild degenerative disc disease at C6-7.  03/31/16 MRI brain [I reviewed images myself and agree with interpretation. -[facility]]  - tiny scattered periventricular and subcortical nonspecific white matter hyperintensities with a differential discussed above. Incidental small scalp lipoma/sebaceous cyst are noted     ASSESSMENT AND PLAN  70 y.o. year old female here with new onset headache, left eye pain, for past 1 year. Neuro exam and MRI brain unremarkable. Symptoms likely related to significant stress factors in her life. Right foot catching may be related to achilles tendon shortening or right - left ankle imbalances.   Dx:  stress / tension HA  1. New onset of headaches after age 73   2. Memory loss   3. Weakness of right foot     PLAN: - reassured patient - advised to work on ankle/foot strengthening exercises - advised on treatment strategies for depression and anxiety (exercise, meditation, nutrition, therapy/counseling, cognitive behavioral therapy) - follow up as needed   Return if symptoms worsen or fail to improve, for return to PCP.    Penni Bombard, MD [id number], [id number] PM Certified in Neurology, Neurophysiology and Neuroimaging  2020 [facility] [address] [place], [place] [address] [phone]

## 2016-04-28 ENCOUNTER — Encounter: Payer: Self-pay | Admitting: Family Medicine

## 2016-04-28 LAB — HM DIABETES EYE EXAM

## 2016-04-29 ENCOUNTER — Ambulatory Visit: Payer: Medicare Other | Admitting: Diagnostic Neuroimaging

## 2016-05-05 ENCOUNTER — Other Ambulatory Visit: Payer: Self-pay | Admitting: Family Medicine

## 2016-06-10 ENCOUNTER — Other Ambulatory Visit: Payer: Self-pay | Admitting: Family Medicine

## 2016-06-23 ENCOUNTER — Other Ambulatory Visit: Payer: Self-pay | Admitting: Family Medicine

## 2016-07-03 ENCOUNTER — Other Ambulatory Visit: Payer: Self-pay | Admitting: Family Medicine

## 2016-07-03 ENCOUNTER — Encounter: Payer: Self-pay | Admitting: Family Medicine

## 2016-07-03 DIAGNOSIS — E1129 Type 2 diabetes mellitus with other diabetic kidney complication: Secondary | ICD-10-CM

## 2016-07-04 ENCOUNTER — Other Ambulatory Visit: Payer: Medicare Other

## 2016-07-07 ENCOUNTER — Telehealth: Payer: Self-pay | Admitting: Family Medicine

## 2016-07-07 ENCOUNTER — Ambulatory Visit: Payer: Medicare Other | Admitting: Family Medicine

## 2016-07-07 NOTE — Telephone Encounter (Signed)
Patient did not come in for their appointment today for 3 month follow up.  Please let me know if patient needs to be contacted immediately for follow up or no follow up needed. °

## 2016-07-08 NOTE — Telephone Encounter (Signed)
Yes please call pt to schedule an OV.

## 2016-07-09 NOTE — Telephone Encounter (Signed)
Pt scheduled for 12/11

## 2016-07-11 ENCOUNTER — Other Ambulatory Visit (INDEPENDENT_AMBULATORY_CARE_PROVIDER_SITE_OTHER): Payer: Medicare Other

## 2016-07-11 DIAGNOSIS — E1129 Type 2 diabetes mellitus with other diabetic kidney complication: Secondary | ICD-10-CM

## 2016-07-11 LAB — HEMOGLOBIN A1C: Hgb A1c MFr Bld: 7.1 % — ABNORMAL HIGH (ref 4.6–6.5)

## 2016-07-14 ENCOUNTER — Ambulatory Visit (INDEPENDENT_AMBULATORY_CARE_PROVIDER_SITE_OTHER): Payer: Medicare Other | Admitting: Family Medicine

## 2016-07-14 VITALS — BP 116/72 | HR 79 | Temp 98.0°F | Wt 144.8 lb

## 2016-07-14 DIAGNOSIS — E1121 Type 2 diabetes mellitus with diabetic nephropathy: Secondary | ICD-10-CM

## 2016-07-14 DIAGNOSIS — I10 Essential (primary) hypertension: Secondary | ICD-10-CM | POA: Diagnosis not present

## 2016-07-14 DIAGNOSIS — E785 Hyperlipidemia, unspecified: Secondary | ICD-10-CM | POA: Diagnosis not present

## 2016-07-14 NOTE — Assessment & Plan Note (Signed)
Well controlled. No changes made today. 

## 2016-07-14 NOTE — Patient Instructions (Signed)
Great to see you. Happy Holidays!  Great work with your diabetes!

## 2016-07-14 NOTE — Progress Notes (Signed)
Subjective:   Patient ID: Wanda [name], female    DOB: [birth year], 70 y.o.   MRN: [id number]  Wanda [name] is a pleasant 70 y.o. year old female who presents to clinic today with Follow-up (39mo) and Heartburn  on 07/14/2016  HPI:  DM - improved control.  Still taking glipizide, januvia and metformin.  Does not check FSBS regularly, denies any episodes of hypoglycemia. Lab Results  Component Value Date   HGBA1C 7.1 (H) 07/11/2016    She is on an ACEI- has h/o CKD II due to diabetes, has seen Dr. Candiss Norse in past. Cr stable.  Lab Results  Component Value Date   CREATININE 1.12 02/20/2016   HLD- on pravachol 10 mg daily. Lab Results  Component Value Date   CHOL 159 02/20/2016   HDL 31.20 (L) 02/20/2016   LDLCALC 83 08/16/2015   LDLDIRECT 86.0 02/20/2016   TRIG 222.0 (H) 02/20/2016   CHOLHDL 5 02/20/2016   Lab Results  Component Value Date   ALT 16 02/20/2016   AST 16 02/20/2016   ALKPHOS 72 02/20/2016   BILITOT 0.4 02/20/2016    Current Outpatient Prescriptions on File Prior to Visit  Medication Sig Dispense Refill  . ACCU-CHEK AVIVA PLUS test strip USE AS INSTRUCTED DAILY. 50 each 6  . busPIRone (BUSPAR) 15 MG tablet TAKE 1 TABLET BY MOUTH 2 TIMES DAILY. 180 tablet 1  . GLIPIZIDE XL 5 MG 24 hr tablet TAKE 1 TABLET (5 MG TOTAL) BY MOUTH DAILY. 90 tablet 0  . JANUVIA 100 MG tablet TAKE 1 TABLET BY MOUTH ONCE DAILY. 90 tablet 3  . lisinopril (PRINIVIL,ZESTRIL) 10 MG tablet TAKE 1 TABLET BY MOUTH ONCE A DAY. 90 tablet 3  . metFORMIN (GLUCOPHAGE-XR) 500 MG 24 hr tablet TAKE 2 TABLETS BY MOUTH DAILY WITH BREAKFAST and one tablet in the evening. 180 tablet 6  . omeprazole (PRILOSEC) 20 MG capsule Take 1 capsule (20 mg total) by mouth daily as needed. 90 capsule 3  . pravastatin (PRAVACHOL) 10 MG tablet TAKE 1 TABLET BY MOUTH DAILY. 90 tablet 1   No current facility-administered medications on file prior to visit.     Allergies  Allergen Reactions  . Niacin     REACTION:  unbearable hot flashes    Past Medical History:  Diagnosis Date  . Colon polyps   . DM (diabetes mellitus) ([place])   . Fibromyalgia    "neurofibromyalgia"  . Headache   . HTN (hypertension)   . Hyperlipidemia     Past Surgical History:  Procedure Laterality Date  . WRIST SURGERY  1969    Family History  Problem Relation Age of Onset  . Heart attack    . Hypertension Mother   . Hyperlipidemia Mother   . Stroke Mother   . Diabetes Sister   . Parkinson's disease Brother     Social History   Social History  . Marital status: Married    Spouse name: N/A  . Number of children: 2  . Years of education: 16   Occupational History  .      retired Pharmacist, hospital   Social History Main Topics  . Smoking status: Never Smoker  . Smokeless tobacco: Never Used  . Alcohol use No  . Drug use: No  . Sexual activity: No   Other Topics Concern  . Not on file   Social History Narrative   Lives with husband    caffeine - none     Review of Systems  Constitutional:  Negative.   HENT: Negative.   Eyes: Negative.   Respiratory: Negative.   Cardiovascular: Negative.   Gastrointestinal: Negative.   Endocrine: Negative.   Genitourinary: Negative.   Musculoskeletal: Negative.   Skin: Negative.   Allergic/Immunologic: Negative.   Neurological: Negative.   Hematological: Negative.   Psychiatric/Behavioral: Negative.   All other systems reviewed and are negative.      Objective:    BP 116/72   Pulse 79   Temp 98 F (36.7 C) (Oral)   Wt 144 lb 12 oz (65.7 kg)   SpO2 98%   BMI 24.46 kg/m   Wt Readings from Last 3 Encounters:  07/14/16 144 lb 12 oz (65.7 kg)  04/23/16 144 lb 6.4 oz (65.5 kg)  03/19/16 143 lb 9.6 oz (65.1 kg)     Physical Exam  Constitutional: She is oriented to person, place, and time. She appears well-developed and well-nourished. No distress.  HENT:  Head: Normocephalic.  Eyes: Conjunctivae are normal.  Cardiovascular: Normal rate and regular  rhythm.   Pulmonary/Chest: Effort normal and breath sounds normal.  Musculoskeletal: Normal range of motion.  Neurological: She is alert and oriented to person, place, and time. No cranial nerve deficit.  Skin: Skin is warm and dry. She is not diaphoretic.  Psychiatric: She has a normal mood and affect. Her behavior is normal. Judgment and thought content normal.  Nursing note and vitals reviewed.         Assessment & Plan:   Essential hypertension  Type 2 diabetes mellitus with diabetic nephropathy, without long-term current use of insulin (HCC)  Hyperlipidemia, unspecified hyperlipidemia type No Follow-up on file.

## 2016-07-14 NOTE — Progress Notes (Signed)
Pre visit review using our clinic review tool, if applicable. No additional management support is needed unless otherwise documented below in the visit note. 

## 2016-07-14 NOTE — Assessment & Plan Note (Signed)
Improved control. No changes made to rxs today. Follow up in 6 months.

## 2016-07-14 NOTE — Assessment & Plan Note (Signed)
At goal for a diabetic. 

## 2016-09-15 ENCOUNTER — Other Ambulatory Visit: Payer: Self-pay | Admitting: Family Medicine

## 2016-10-27 ENCOUNTER — Other Ambulatory Visit: Payer: Self-pay | Admitting: Family Medicine

## 2016-10-31 ENCOUNTER — Other Ambulatory Visit: Payer: Self-pay | Admitting: Family Medicine

## 2016-12-29 ENCOUNTER — Other Ambulatory Visit: Payer: Self-pay | Admitting: Family Medicine

## 2017-01-20 ENCOUNTER — Telehealth: Payer: Self-pay | Admitting: Family Medicine

## 2017-01-20 DIAGNOSIS — N183 Chronic kidney disease, stage 3 unspecified: Secondary | ICD-10-CM

## 2017-01-20 DIAGNOSIS — E1121 Type 2 diabetes mellitus with diabetic nephropathy: Secondary | ICD-10-CM

## 2017-01-20 DIAGNOSIS — E785 Hyperlipidemia, unspecified: Secondary | ICD-10-CM

## 2017-01-20 NOTE — Telephone Encounter (Signed)
Lab appt was scheduled

## 2017-01-20 NOTE — Telephone Encounter (Signed)
Patient called to schedule 6 month follow up on 02/05/17.  Patient wants to know if Dr.Aron wants her to have lab work done before the appointment? Patient said a detailed message can be left on her voice mail.

## 2017-01-20 NOTE — Telephone Encounter (Signed)
Yes I will place lab orders now.

## 2017-01-22 ENCOUNTER — Ambulatory Visit (HOSPITAL_BASED_OUTPATIENT_CLINIC_OR_DEPARTMENT_OTHER)
Admission: RE | Admit: 2017-01-22 | Discharge: 2017-01-22 | Disposition: A | Discharge status: Home / Self Care | Payer: Medicare Other | Source: Ambulatory Visit | Attending: Family Medicine | Admitting: Family Medicine

## 2017-01-22 ENCOUNTER — Ambulatory Visit: Payer: Medicare Other | Admitting: Family Medicine

## 2017-01-22 ENCOUNTER — Ambulatory Visit (INDEPENDENT_AMBULATORY_CARE_PROVIDER_SITE_OTHER): Payer: Medicare Other | Admitting: Family Medicine

## 2017-01-22 ENCOUNTER — Encounter: Payer: Self-pay | Admitting: Family Medicine

## 2017-01-22 VITALS — BP 102/70 | HR 80 | Ht 64.5 in | Wt 145.0 lb

## 2017-01-22 DIAGNOSIS — I10 Essential (primary) hypertension: Secondary | ICD-10-CM

## 2017-01-22 DIAGNOSIS — N183 Chronic kidney disease, stage 3 unspecified: Secondary | ICD-10-CM

## 2017-01-22 DIAGNOSIS — D259 Leiomyoma of uterus, unspecified: Secondary | ICD-10-CM | POA: Insufficient documentation

## 2017-01-22 DIAGNOSIS — N819 Female genital prolapse, unspecified: Secondary | ICD-10-CM | POA: Insufficient documentation

## 2017-01-22 DIAGNOSIS — E1121 Type 2 diabetes mellitus with diabetic nephropathy: Secondary | ICD-10-CM | POA: Diagnosis not present

## 2017-01-22 DIAGNOSIS — E785 Hyperlipidemia, unspecified: Secondary | ICD-10-CM | POA: Diagnosis not present

## 2017-01-22 DIAGNOSIS — N814 Uterovaginal prolapse, unspecified: Secondary | ICD-10-CM | POA: Insufficient documentation

## 2017-01-22 LAB — COMPREHENSIVE METABOLIC PANEL
ALBUMIN: 4.2 g/dL (ref 3.5–5.2)
ALK PHOS: 79 U/L (ref 39–117)
ALT: 17 U/L (ref 0–35)
AST: 16 U/L (ref 0–37)
BILIRUBIN TOTAL: 0.4 mg/dL (ref 0.2–1.2)
BUN: 19 mg/dL (ref 6–23)
CALCIUM: 9.8 mg/dL (ref 8.4–10.5)
CO2: 28 mEq/L (ref 19–32)
Chloride: 105 mEq/L (ref 96–112)
Creatinine, Ser: 1.19 mg/dL (ref 0.40–1.20)
GFR: 47.77 mL/min — ABNORMAL LOW (ref >60.00)
Glucose, Bld: 170 mg/dL — ABNORMAL HIGH (ref 70–99)
Potassium: 4.5 mEq/L (ref 3.5–5.1)
SODIUM: 139 meq/L (ref 135–145)
TOTAL PROTEIN: 7.3 g/dL (ref 6.0–8.3)

## 2017-01-22 LAB — LIPID PANEL
CHOL/HDL RATIO: 5
CHOLESTEROL: 172 mg/dL (ref 0–200)
HDL: 33.4 mg/dL — ABNORMAL LOW (ref >39.00)
NonHDL: 138.62
TRIGLYCERIDES: 375 mg/dL — AB (ref 0.0–149.0)
VLDL: 75 mg/dL — ABNORMAL HIGH (ref 0.0–40.0)

## 2017-01-22 LAB — HEMOGLOBIN A1C: Hgb A1c MFr Bld: 7.9 % — ABNORMAL HIGH (ref 4.6–6.5)

## 2017-01-22 LAB — LDL CHOLESTEROL, DIRECT: Direct LDL: 76 mg/dL

## 2017-01-22 NOTE — Progress Notes (Signed)
Pre visit review using our clinic review tool, if applicable. No additional management support is needed unless otherwise documented below in the visit note. 

## 2017-01-22 NOTE — Assessment & Plan Note (Signed)
Not appreciated on exam even with valsalva but description seems very consistent with pelvic organ prolapse. Pelvic US for further evaluation. The patient indicates understanding of these issues and agrees with the plan.

## 2017-01-22 NOTE — Assessment & Plan Note (Signed)
Continue current rx. Check labs today. 

## 2017-01-22 NOTE — Assessment & Plan Note (Signed)
At goal for a diabetic. No changes made today.

## 2017-01-22 NOTE — Patient Instructions (Signed)
Good to see you. Please stop by to see Rosaria Ferries on your way out to schedule your ultrasound.

## 2017-01-22 NOTE — Progress Notes (Signed)
Subjective:   Patient ID: Wanda [name], female    DOB: 07-28-1946, 71 y.o.   MRN: [id number]  Wanda [name] is a pleasant 71 y.o. year old female who presents to clinic today with Follow-up  on 01/22/2017  HPI:  ? Something wrong with her pelvic area?  A few times in last several days, felt an egg like mass from her vagina.  Usually occurred after straining.  Not painful and it goes away.  Has never had anything like this before. Denies dysuria or urinary incontinence. Has had more constipation. No post menopausal bleeding.  DM - improved control.  Still taking glipizide, januvia and metformin.  Does not check FSBS regularly, denies any episodes of hypoglycemia. Lab Results  Component Value Date   HGBA1C 7.1 (H) 07/11/2016    She is on an ACEI- has h/o CKD II due to diabetes, has seen Dr. Candiss Norse in past. Cr stable.  Lab Results  Component Value Date   CREATININE 1.12 02/20/2016   HLD- on pravachol 10 mg daily. Lab Results  Component Value Date   CHOL 159 02/20/2016   HDL 31.20 (L) 02/20/2016   LDLCALC 83 08/16/2015   LDLDIRECT 86.0 02/20/2016   TRIG 222.0 (H) 02/20/2016   CHOLHDL 5 02/20/2016   Lab Results  Component Value Date   ALT 16 02/20/2016   AST 16 02/20/2016   ALKPHOS 72 02/20/2016   BILITOT 0.4 02/20/2016    Current Outpatient Prescriptions on File Prior to Visit  Medication Sig Dispense Refill  . ACCU-CHEK AVIVA PLUS test strip USE AS INSTRUCTED DAILY. 50 each 6  . busPIRone (BUSPAR) 15 MG tablet TAKE 1 TABLET BY MOUTH 2 TIMES DAILY. 180 tablet 1  . GLIPIZIDE XL 5 MG 24 hr tablet TAKE 1 TABLET BY MOUTH DAILY. 90 tablet 1  . JANUVIA 100 MG tablet TAKE 1 TABLET BY MOUTH ONCE DAILY. 90 tablet 0  . lisinopril (PRINIVIL,ZESTRIL) 10 MG tablet TAKE 1 TABLET BY MOUTH ONCE A DAY. 90 tablet 3  . metFORMIN (GLUCOPHAGE-XR) 500 MG 24 hr tablet TAKE 2 TABLETS BY MOUTH DAILY WITH BREAKFAST and one tablet in the evening. 180 tablet 6  . metFORMIN (GLUCOPHAGE-XR) 500  MG 24 hr tablet TAKE 2 TABLETS BY MOUTH DAILY WITH BREAKFAST. 60 tablet 6  . omeprazole (PRILOSEC) 20 MG capsule Take 1 capsule (20 mg total) by mouth daily as needed. 90 capsule 3  . pravastatin (PRAVACHOL) 10 MG tablet TAKE 1 TABLET BY MOUTH DAILY. 90 tablet 1   No current facility-administered medications on file prior to visit.     Allergies  Allergen Reactions  . Niacin     REACTION: unbearable hot flashes    Past Medical History:  Diagnosis Date  . Colon polyps   . DM (diabetes mellitus) ([place])   . Fibromyalgia    "neurofibromyalgia"  . Headache   . HTN (hypertension)   . Hyperlipidemia     Past Surgical History:  Procedure Laterality Date  . WRIST SURGERY  1969    Family History  Problem Relation Age of Onset  . Heart attack Unknown   . Hypertension Mother   . Hyperlipidemia Mother   . Stroke Mother   . Diabetes Sister   . Parkinson's disease Brother     Social History   Social History  . Marital status: Married    Spouse name: N/A  . Number of children: 2  . Years of education: 16   Occupational History  .  retired Pharmacist, hospital   Social History Main Topics  . Smoking status: Never Smoker  . Smokeless tobacco: Never Used  . Alcohol use No  . Drug use: No  . Sexual activity: No   Other Topics Concern  . Not on file   Social History Narrative   Lives with husband    caffeine - none     Review of Systems  Constitutional: Negative.   HENT: Negative.   Eyes: Negative.   Respiratory: Negative.   Cardiovascular: Negative.   Gastrointestinal: Negative.   Endocrine: Negative.   Genitourinary: Positive for pelvic pain. Negative for decreased urine volume, genital sores, hematuria, menstrual problem, urgency, vaginal bleeding, vaginal discharge and vaginal pain.  Musculoskeletal: Negative.   Skin: Negative.   Allergic/Immunologic: Negative.   Neurological: Negative.   Hematological: Negative.   Psychiatric/Behavioral: Negative.   All other  systems reviewed and are negative.      Objective:    BP 102/70   Pulse 80   Ht 5' 4.5" (1.638 m)   Wt 145 lb (65.8 kg)   SpO2 98%   BMI 24.50 kg/m   Wt Readings from Last 3 Encounters:  01/22/17 145 lb (65.8 kg)  07/14/16 144 lb 12 oz (65.7 kg)  04/23/16 144 lb 6.4 oz (65.5 kg)     Physical Exam  Constitutional: She is oriented to person, place, and time. She appears well-developed and well-nourished. No distress.  HENT:  Head: Normocephalic.  Eyes: Conjunctivae are normal.  Neck: Normal range of motion.  Cardiovascular: Normal rate and regular rhythm.   Pulmonary/Chest: Effort normal and breath sounds normal.  Genitourinary: Rectum normal, vagina normal and uterus normal.  Genitourinary Comments: No visible prolapse with valsalva  Musculoskeletal: Normal range of motion. She exhibits no edema or deformity.  Neurological: She is alert and oriented to person, place, and time. No cranial nerve deficit.  Skin: Skin is warm and dry. She is not diaphoretic.  Psychiatric: She has a normal mood and affect. Her behavior is normal. Judgment and thought content normal.  Nursing note and vitals reviewed.         Assessment & Plan:   Type 2 diabetes mellitus with diabetic nephropathy, without long-term current use of insulin (HCC)  CKD (chronic kidney disease) stage 3, GFR 30-59 ml/min  Hyperlipidemia, unspecified hyperlipidemia type No Follow-up on file.

## 2017-01-22 NOTE — Assessment & Plan Note (Signed)
Continue current rxs.  Check labs today. 

## 2017-01-23 ENCOUNTER — Other Ambulatory Visit: Payer: Self-pay | Admitting: Family Medicine

## 2017-01-23 DIAGNOSIS — N814 Uterovaginal prolapse, unspecified: Secondary | ICD-10-CM

## 2017-01-29 ENCOUNTER — Other Ambulatory Visit: Payer: Self-pay | Admitting: Family Medicine

## 2017-02-03 ENCOUNTER — Other Ambulatory Visit: Payer: Medicare Other

## 2017-02-05 ENCOUNTER — Ambulatory Visit: Payer: Medicare Other | Admitting: Family Medicine

## 2017-02-17 ENCOUNTER — Other Ambulatory Visit: Payer: Self-pay | Admitting: Family Medicine

## 2017-03-31 ENCOUNTER — Other Ambulatory Visit: Payer: Self-pay | Admitting: Family Medicine

## 2017-04-01 ENCOUNTER — Other Ambulatory Visit: Payer: Self-pay | Admitting: Family Medicine

## 2017-04-30 ENCOUNTER — Encounter: Payer: Self-pay | Admitting: Family Medicine

## 2017-04-30 LAB — HM DIABETES EYE EXAM

## 2017-05-05 ENCOUNTER — Other Ambulatory Visit (INDEPENDENT_AMBULATORY_CARE_PROVIDER_SITE_OTHER): Payer: Medicare Other

## 2017-05-05 DIAGNOSIS — E785 Hyperlipidemia, unspecified: Secondary | ICD-10-CM | POA: Diagnosis not present

## 2017-05-05 DIAGNOSIS — E1121 Type 2 diabetes mellitus with diabetic nephropathy: Secondary | ICD-10-CM | POA: Diagnosis not present

## 2017-05-05 LAB — LIPID PANEL
CHOL/HDL RATIO: 5
Cholesterol: 151 mg/dL (ref 0–200)
HDL: 32.3 mg/dL — ABNORMAL LOW (ref >39.00)
LDL Cholesterol: 84 mg/dL (ref 0–99)
NonHDL: 118.61
TRIGLYCERIDES: 172 mg/dL — AB (ref 0.0–149.0)
VLDL: 34.4 mg/dL (ref 0.0–40.0)

## 2017-05-05 LAB — COMPREHENSIVE METABOLIC PANEL
ALK PHOS: 69 U/L (ref 39–117)
ALT: 13 U/L (ref 0–35)
AST: 13 U/L (ref 0–37)
Albumin: 4 g/dL (ref 3.5–5.2)
BILIRUBIN TOTAL: 0.5 mg/dL (ref 0.2–1.2)
BUN: 18 mg/dL (ref 6–23)
CALCIUM: 9.4 mg/dL (ref 8.4–10.5)
CO2: 27 meq/L (ref 19–32)
CREATININE: 1.03 mg/dL (ref 0.40–1.20)
Chloride: 108 mEq/L (ref 96–112)
GFR: 56.39 mL/min — AB (ref >60.00)
GLUCOSE: 165 mg/dL — AB (ref 70–99)
Potassium: 4.4 mEq/L (ref 3.5–5.1)
Sodium: 143 mEq/L (ref 135–145)
TOTAL PROTEIN: 7 g/dL (ref 6.0–8.3)

## 2017-05-05 LAB — HEMOGLOBIN A1C: Hgb A1c MFr Bld: 7 % — ABNORMAL HIGH (ref 4.6–6.5)

## 2017-05-06 ENCOUNTER — Encounter: Payer: Self-pay | Admitting: Family Medicine

## 2017-05-06 ENCOUNTER — Ambulatory Visit (INDEPENDENT_AMBULATORY_CARE_PROVIDER_SITE_OTHER): Payer: Medicare Other | Admitting: Family Medicine

## 2017-05-06 VITALS — BP 130/70 | HR 70 | Temp 98.0°F | Wt 143.5 lb

## 2017-05-06 DIAGNOSIS — E1121 Type 2 diabetes mellitus with diabetic nephropathy: Secondary | ICD-10-CM

## 2017-05-06 DIAGNOSIS — I10 Essential (primary) hypertension: Secondary | ICD-10-CM

## 2017-05-06 DIAGNOSIS — N183 Chronic kidney disease, stage 3 unspecified: Secondary | ICD-10-CM

## 2017-05-06 DIAGNOSIS — Z23 Encounter for immunization: Secondary | ICD-10-CM | POA: Diagnosis not present

## 2017-05-06 NOTE — Assessment & Plan Note (Signed)
Stable

## 2017-05-06 NOTE — Assessment & Plan Note (Signed)
Congratulated her on improved control. No changes made today.

## 2017-05-06 NOTE — Progress Notes (Signed)
Subjective:   Patient ID: Wanda [name], female    DOB: [birth year], 71 y.o.   MRN: [id number]  Wanda [name] is a pleasant 71 y.o. year old female who presents to clinic today with Follow-up  on 05/06/2017  HPI:  DM - improved control!  Has been walking 30 minutes, 5 days per week.  Wt Readings from Last 3 Encounters:  05/06/17 143 lb 8 oz (65.1 kg)  01/22/17 145 lb (65.8 kg)  07/14/16 144 lb 12 oz (65.7 kg)     Still taking glipizide, januvia and metformin.  Does not check FSBS regularly, denies any episodes of hypoglycemia. Lab Results  Component Value Date   HGBA1C 7.0 (H) 05/05/2017    She is on an ACEI- has h/o CKD II due to diabetes, has seen Dr. Candiss Norse in past. Cr stable.  Lab Results  Component Value Date   CREATININE 1.03 05/05/2017   HLD- on pravachol 10 mg daily. Lab Results  Component Value Date   CHOL 151 05/05/2017   HDL 32.30 (L) 05/05/2017   LDLCALC 84 05/05/2017   LDLDIRECT 76.0 01/22/2017   TRIG 172.0 (H) 05/05/2017   CHOLHDL 5 05/05/2017   Lab Results  Component Value Date   ALT 13 05/05/2017   AST 13 05/05/2017   ALKPHOS 69 05/05/2017   BILITOT 0.5 05/05/2017    Current Outpatient Prescriptions on File Prior to Visit  Medication Sig Dispense Refill  . ACCU-CHEK AVIVA PLUS test strip USE AS INSTRUCTED DAILY. 50 each 6  . busPIRone (BUSPAR) 15 MG tablet TAKE 1 TABLET BY MOUTH 2 TIMES DAILY. 180 tablet 1  . GLIPIZIDE XL 5 MG 24 hr tablet TAKE 1 TABLET BY MOUTH DAILY. 90 tablet 1  . JANUVIA 100 MG tablet TAKE 1 TABLET BY MOUTH ONCE DAILY. 90 tablet 0  . lisinopril (PRINIVIL,ZESTRIL) 10 MG tablet TAKE 1 TABLET BY MOUTH ONCE A DAY. 90 tablet 3  . metFORMIN (GLUCOPHAGE-XR) 500 MG 24 hr tablet TAKE 2 TABLETS BY MOUTH DAILY WITH BREAKFAST AND 1 TABLET IN THE EVENING. 180 tablet 3  . omeprazole (PRILOSEC) 20 MG capsule Take 1 capsule (20 mg total) by mouth daily as needed. 90 capsule 3  . pravastatin (PRAVACHOL) 10 MG tablet TAKE 1 TABLET BY MOUTH  DAILY. 90 tablet 2  . pravastatin (PRAVACHOL) 10 MG tablet TAKE 1 TABLET BY MOUTH DAILY. 90 tablet 2   No current facility-administered medications on file prior to visit.     Allergies  Allergen Reactions  . Niacin     REACTION: unbearable hot flashes    Past Medical History:  Diagnosis Date  . Colon polyps   . DM (diabetes mellitus) ([place])   . Fibromyalgia    "neurofibromyalgia"  . Headache   . HTN (hypertension)   . Hyperlipidemia     Past Surgical History:  Procedure Laterality Date  . WRIST SURGERY  1969    Family History  Problem Relation Age of Onset  . Heart attack Unknown   . Hypertension Mother   . Hyperlipidemia Mother   . Stroke Mother   . Diabetes Sister   . Parkinson's disease Brother     Social History   Social History  . Marital status: Married    Spouse name: N/A  . Number of children: 2  . Years of education: 16   Occupational History  .      retired Pharmacist, hospital   Social History Main Topics  . Smoking status: Never Smoker  . Smokeless tobacco:  Never Used  . Alcohol use No  . Drug use: No  . Sexual activity: No   Other Topics Concern  . Not on file   Social History Narrative   Lives with husband    caffeine - none     Review of Systems  Constitutional: Negative.   HENT: Negative.   Eyes: Negative.   Respiratory: Negative.   Cardiovascular: Negative.   Gastrointestinal: Negative.   Endocrine: Negative.   Genitourinary: Negative for decreased urine volume, genital sores, hematuria, menstrual problem, pelvic pain, urgency, vaginal bleeding, vaginal discharge and vaginal pain.  Musculoskeletal: Negative.   Skin: Negative.   Allergic/Immunologic: Negative.   Neurological: Negative.   Hematological: Negative.   Psychiatric/Behavioral: Negative.   All other systems reviewed and are negative.      Objective:    BP 130/70 (BP Location: Left Arm, Patient Position: Sitting, Cuff Size: Normal)   Pulse 70   Temp 98 F (36.7 C)  (Oral)   Wt 143 lb 8 oz (65.1 kg)   SpO2 98%   BMI 24.25 kg/m   Wt Readings from Last 3 Encounters:  05/06/17 143 lb 8 oz (65.1 kg)  01/22/17 145 lb (65.8 kg)  07/14/16 144 lb 12 oz (65.7 kg)     Physical Exam  Constitutional: She is oriented to person, place, and time. She appears well-developed and well-nourished. No distress.  HENT:  Head: Normocephalic.  Eyes: Conjunctivae are normal.  Neck: Normal range of motion.  Cardiovascular: Normal rate and regular rhythm.   Pulmonary/Chest: Effort normal and breath sounds normal.  Genitourinary: Rectum normal, vagina normal and uterus normal.  Musculoskeletal: Normal range of motion. She exhibits no edema or deformity.  Neurological: She is alert and oriented to person, place, and time. No cranial nerve deficit.  Skin: Skin is warm and dry. She is not diaphoretic.  Psychiatric: She has a normal mood and affect. Her behavior is normal. Judgment and thought content normal.  Nursing note and vitals reviewed.         Assessment & Plan:   Type 2 diabetes mellitus with diabetic nephropathy, without long-term current use of insulin ([place])  Essential hypertension No Follow-up on file.

## 2017-06-05 ENCOUNTER — Other Ambulatory Visit: Payer: Self-pay | Admitting: Family Medicine

## 2017-06-24 ENCOUNTER — Encounter: Payer: Medicare Other | Admitting: Family Medicine

## 2017-07-30 ENCOUNTER — Encounter: Payer: Self-pay | Admitting: Family Medicine

## 2017-07-30 ENCOUNTER — Ambulatory Visit: Payer: Self-pay | Admitting: *Deleted

## 2017-07-30 ENCOUNTER — Ambulatory Visit: Payer: Medicare Other | Admitting: Family Medicine

## 2017-07-30 VITALS — BP 128/86 | HR 76 | Temp 98.5°F | Ht 64.5 in | Wt 143.0 lb

## 2017-07-30 DIAGNOSIS — J069 Acute upper respiratory infection, unspecified: Secondary | ICD-10-CM | POA: Diagnosis not present

## 2017-07-30 MED ORDER — PREDNISONE 5 MG PO TABS: ORAL_TABLET | ORAL | 0 refills | Status: DC

## 2017-07-30 NOTE — Telephone Encounter (Signed)
Pt  Denies  Any  Fever    She   Reports  She  Has  Not   Taken  Her tempature but  She  Has    Severe  Pain  Around  Eyes   And  Temples   With  Some blood  Tinged  Yellow  Greenish   Drainage ,  Symptoms  X  4  Days. Pt  Is  A  Diabetic     As   Well . Attempted  To  Make  An  Appointment   With Dr Deborra Medina  At  [facility]  No  Availability  Today   With any  Providers  At  [facility].      Reason for Disposition . [1] Sinus pain (not just congestion) AND [2] fever  Answer Assessment - Initial Assessment Questions 1. LOCATION: "Where does it hurt?"       Pain  Around  Eyes   Near  [place]     2. ONSET: "When did the sinus pain start?"  (e.g., hours, days)        4  Days  Ago    3. SEVERITY: "How bad is the pain?"   (Scale 1-10; mild, moderate or severe)   - MILD (1-3): doesn't interfere with normal activities    - MODERATE (4-7): interferes with normal activities (e.g., work or school) or awakens from sleep   - SEVERE (8-10): excruciating pain and patient unable to do any normal activities        8 4. RECURRENT SYMPTOM: "Have you ever had sinus problems before?" If so, ask: "When was the last time?" and "What happened that time?"      Yes      1-2  Years     5. NASAL CONGESTION: "Is the nose blocked?" If so, ask, "Can you open it or must you breathe through the mouth?"      Slight   Congestion    -  Able  To  Breathe  Through  It   6. NASAL DISCHARGE: "Do you have discharge from your nose?" If so ask, "What color?"         Greenish    Yellow   With  Some  Blood  Present  Small  Amount   7. FEVER: "Do you have a fever?" If so, ask: "What is it, how was it measured, and when did it start?"     No  8. OTHER SYMPTOMS: "Do you have any other symptoms?" (e.g., sore throat, cough, earache, difficulty breathing)      Sensation   Of  Fullness  Both  -   No   Cough   9. PREGNANCY: "Is there any chance you are pregnant?" "When was your last menstrual period?"     n/a  Protocols used: SINUS PAIN OR  CONGESTION-A-AH

## 2017-07-30 NOTE — Patient Instructions (Signed)
Please try things such as zyrtec-D or allegra-D which is an antihistamine and decongestant.   Please try afrin which will help with nasal congestion but use for only three days.   Please also try using a netti pot on a regular occasion.  Honey can help with a sore throat.

## 2017-07-30 NOTE — Progress Notes (Signed)
Wanda [name] - 71 y.o. female MRN [id number]  Date of birth: April 18, 1946  SUBJECTIVE:  Including CC & ROS.  Chief Complaint  Patient presents with  . Sinusitis    Wanda [name] is a 71 y.o. female that is presenting with sinus pressure/drainage. Has been ongoing for five days. Patient has been taking Mucinex with no improvement.. Denies body aches and chills. Denies any fevers. Feels like her symptoms have had some improvement today. 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 No body aches. No rashes.     Review of Systems  Constitutional: Negative for fever.  HENT: Positive for rhinorrhea and sinus pressure.   Respiratory: Negative for shortness of breath.   Cardiovascular: Negative for chest pain.  Musculoskeletal: Negative for gait problem.  Skin: Negative for color change.  Hematological: Negative for adenopathy.    HISTORY: Past Medical, Surgical, Social, and Family History Reviewed & Updated per EMR.   Pertinent Historical Findings include:  Past Medical History:  Diagnosis Date  . Colon polyps   . DM (diabetes mellitus) ([place])   . Fibromyalgia    "neurofibromyalgia"  . Headache   . HTN (hypertension)   . Hyperlipidemia     Past Surgical History:  Procedure Laterality Date  . WRIST SURGERY  1969    Allergies  Allergen Reactions  . Niacin     REACTION: unbearable hot flashes    Family History  Problem Relation Age of Onset  . Heart attack Unknown   . Hypertension Mother   . Hyperlipidemia Mother   . Stroke Mother   . Diabetes Sister   . Parkinson's disease Brother      Social History   Socioeconomic History  . Marital status: Married    Spouse name: Not on file  . Number of children: 2  . Years of education: 61  . Highest education level: Not on file  Social Needs  . Financial resource strain: Not on file  . Food insecurity - worry: Not on file  . Food insecurity - inability: Not on file  . Transportation needs - medical: Not on file  .  Transportation needs - non-medical: Not on file  Occupational History    Comment: retired Pharmacist, hospital  Tobacco Use  . Smoking status: Never Smoker  . Smokeless tobacco: Never Used  Substance and Sexual Activity  . Alcohol use: No    Alcohol/week: 0.0 oz  . Drug use: No  . Sexual activity: No  Other Topics Concern  . Not on file  Social History Narrative   Lives with husband    caffeine - none     PHYSICAL EXAM:  VS: BP 128/86 (BP Location: Left Arm, Patient Position: Sitting, Cuff Size: Normal)   Pulse 76   Temp 98.5 F (36.9 C) (Oral)   Ht 5' 4.5" (1.638 m)   Wt 143 lb (64.9 kg)   SpO2 100%   BMI 24.17 kg/m  Physical Exam Gen: NAD, alert, cooperative with exam,  ENT: normal lips, normal nasal mucosa, tympanic membranes clear and intact bilaterally, normal oropharynx, no cervical lymphadenopathy Eye: normal EOM, normal conjunctiva and lids CV:  no edema, +2 pedal pulses, regular rate and rhythm, S1-S2   Resp: no accessory muscle use, non-labored, clear to auscultation bilaterally, no crackles or wheezes GI: no masses or tenderness, no hernia  Skin: no rashes, no areas of induration  Neuro: normal tone, normal sensation to touch Psych:  normal insight, alert and oriented MSK: Normal gait, normal strength  ASSESSMENT & PLAN:   Upper respiratory tract infection Likely viral in nature.  - try prednisone  - counseled on supportive care - given indications to follow up.

## 2017-07-31 DIAGNOSIS — J069 Acute upper respiratory infection, unspecified: Secondary | ICD-10-CM | POA: Insufficient documentation

## 2017-07-31 NOTE — Assessment & Plan Note (Signed)
Likely viral in nature.  - try prednisone  - counseled on supportive care - given indications to follow up.

## 2017-08-11 ENCOUNTER — Other Ambulatory Visit: Payer: Self-pay | Admitting: Family Medicine

## 2017-08-17 ENCOUNTER — Other Ambulatory Visit: Payer: Self-pay

## 2017-08-17 NOTE — Patient Outreach (Signed)
[place] [facility]) Care Management  08/17/2017  Malyia Moro December 01, 1945 [id number]   Call patient to see if she needs patient's Assistance with one of her medication Januvia 100 mg talk to patient she said this is the most expensive medication she purchase,patient ask if there is any help she is willing to use it, I will be send it to Etter Sjogren she will summit a Patients Assistance application for Mrs. Berna Bue .  [place] Management Direct Dial [phone]  Fax [phone] [email or web address]

## 2017-08-19 ENCOUNTER — Other Ambulatory Visit: Payer: Self-pay | Admitting: Pharmacy Technician

## 2017-08-19 NOTE — Patient Outreach (Signed)
[facility]) Care Management  08/19/2017  Angeligue Bowne Feb 08, 1946 [id number]   Successful Outreach call to Ms. Carriveau in reference to [employer] Celesta Gentile) patient assistance application. Informed patient that I mailed the application out today. Requested patient to contact me once she has received the patient portion of application and has mailed it back into me.  Will follow up with patient in 10 days if I have not heard from her before.  Maud Deed [place], [place] Management [phone]

## 2017-08-20 ENCOUNTER — Other Ambulatory Visit: Payer: Self-pay

## 2017-08-20 MED ORDER — GLUCOSE BLOOD VI STRP: ORAL_STRIP | 99 refills | Status: DC

## 2017-08-24 ENCOUNTER — Other Ambulatory Visit: Payer: Medicare Other

## 2017-08-26 ENCOUNTER — Ambulatory Visit: Payer: Medicare Other | Admitting: Family Medicine

## 2017-08-28 ENCOUNTER — Ambulatory Visit: Payer: Self-pay | Admitting: Pharmacy Technician

## 2017-08-28 ENCOUNTER — Telehealth: Payer: Self-pay | Admitting: Family Medicine

## 2017-08-28 DIAGNOSIS — N183 Chronic kidney disease, stage 3 unspecified: Secondary | ICD-10-CM

## 2017-08-28 DIAGNOSIS — E1121 Type 2 diabetes mellitus with diabetic nephropathy: Secondary | ICD-10-CM

## 2017-08-28 DIAGNOSIS — I10 Essential (primary) hypertension: Secondary | ICD-10-CM

## 2017-08-28 NOTE — Telephone Encounter (Signed)
TA-Pt requesting an order for DM labs/last labs completed in Oct for CPE/Plz advise and I can create future order and contact pt to sched lab visit/thx dmf

## 2017-08-28 NOTE — Telephone Encounter (Signed)
Copied from [place] [id number]. Topic: General - Other >> Aug 28, 2017  1:27 PM Wanda [name], [place] wrote: Reason for CRM: pt would like an order for her diabetes lab work  Please contact  pt [phone] if this can be done

## 2017-08-28 NOTE — Telephone Encounter (Signed)
Please order a1c, lipid panel, CMET.  Thank you!

## 2017-08-31 NOTE — Addendum Note (Signed)
Addended by: Marrion Coy on: 08/31/2017 02:57 PM   Modules accepted: Orders

## 2017-08-31 NOTE — Telephone Encounter (Signed)
Created future orders per TA/thx dmf

## 2017-08-31 NOTE — Telephone Encounter (Signed)
Called pt to inform that future orders were placed/she is now scheduled for labs 1 week prior to appt/thx dmf

## 2017-09-01 ENCOUNTER — Other Ambulatory Visit: Payer: Self-pay | Admitting: Pharmacy Technician

## 2017-09-01 NOTE — Patient Outreach (Signed)
[place] [facility]) Care Management  09/01/2017  Ashla Murph 1946/07/15 [id number]   Successful outreach call to Ms. Baig. HIPAA identifiers verified. Patient states she has received [employer] application that was mailed to her however, she has not had the opportunity to fill it out. Requested patient to contact me once she has mailed it back in.  Maud Deed [place], [place] Management [phone]

## 2017-09-09 ENCOUNTER — Other Ambulatory Visit (INDEPENDENT_AMBULATORY_CARE_PROVIDER_SITE_OTHER): Payer: Medicare Other

## 2017-09-09 ENCOUNTER — Other Ambulatory Visit: Payer: Self-pay | Admitting: Family Medicine

## 2017-09-09 DIAGNOSIS — I10 Essential (primary) hypertension: Secondary | ICD-10-CM | POA: Diagnosis not present

## 2017-09-09 DIAGNOSIS — E1121 Type 2 diabetes mellitus with diabetic nephropathy: Secondary | ICD-10-CM | POA: Diagnosis not present

## 2017-09-09 DIAGNOSIS — N183 Chronic kidney disease, stage 3 unspecified: Secondary | ICD-10-CM

## 2017-09-09 LAB — COMPREHENSIVE METABOLIC PANEL
ALBUMIN: 4 g/dL (ref 3.5–5.2)
ALK PHOS: 82 U/L (ref 39–117)
ALT: 16 U/L (ref 0–35)
AST: 15 U/L (ref 0–37)
BUN: 22 mg/dL (ref 6–23)
CO2: 27 mEq/L (ref 19–32)
Calcium: 9.7 mg/dL (ref 8.4–10.5)
Chloride: 109 mEq/L (ref 96–112)
Creatinine, Ser: 1.07 mg/dL (ref 0.40–1.20)
GFR: 53.91 mL/min — AB (ref >60.00)
Glucose, Bld: 167 mg/dL — ABNORMAL HIGH (ref 70–99)
POTASSIUM: 5.5 meq/L — AB (ref 3.5–5.1)
Sodium: 142 mEq/L (ref 135–145)
TOTAL PROTEIN: 7.5 g/dL (ref 6.0–8.3)
Total Bilirubin: 0.7 mg/dL (ref 0.2–1.2)

## 2017-09-09 LAB — LIPID PANEL
Cholesterol: 172 mg/dL (ref 0–200)
HDL: 35.9 mg/dL — AB (ref >39.00)
NonHDL: 135.95
TRIGLYCERIDES: 205 mg/dL — AB (ref 0.0–149.0)
Total CHOL/HDL Ratio: 5
VLDL: 41 mg/dL — ABNORMAL HIGH (ref 0.0–40.0)

## 2017-09-09 LAB — HEMOGLOBIN A1C: HEMOGLOBIN A1C: 8.2 % — AB (ref 4.6–6.5)

## 2017-09-09 LAB — LDL CHOLESTEROL, DIRECT: LDL DIRECT: 104 mg/dL

## 2017-09-09 MED ORDER — SODIUM POLYSTYRENE SULFONATE PO POWD: ORAL | 0 refills | Status: DC

## 2017-09-11 ENCOUNTER — Other Ambulatory Visit: Payer: Self-pay | Admitting: Pharmacy Technician

## 2017-09-11 NOTE — Patient Outreach (Signed)
[place] [facility]) Care Management  09/11/2017  Mikell Camp 07/02/1946 [id number]   Unsuccessful outreach call #1 to Ms. Ahr in reference to [employer] patient assistance. Left HIPAA appropriate voicemail.  Maud Deed [place], [place] Management [phone]

## 2017-09-14 ENCOUNTER — Encounter: Payer: Self-pay | Admitting: Family Medicine

## 2017-09-14 ENCOUNTER — Ambulatory Visit: Payer: Medicare Other | Admitting: Family Medicine

## 2017-09-14 VITALS — BP 118/72 | HR 74 | Temp 98.4°F | Ht 64.5 in | Wt 143.4 lb

## 2017-09-14 DIAGNOSIS — E875 Hyperkalemia: Secondary | ICD-10-CM | POA: Diagnosis not present

## 2017-09-14 DIAGNOSIS — E1121 Type 2 diabetes mellitus with diabetic nephropathy: Secondary | ICD-10-CM | POA: Diagnosis not present

## 2017-09-14 NOTE — Assessment & Plan Note (Signed)
Deteriorated which is not unexpected given her current home stressors.  She also admits to not taking her rxs daily and as prescribed.  She will restart them and try to walk in her house more. No changes made to rxs today. Follow up a1c in 3 months.

## 2017-09-14 NOTE — Assessment & Plan Note (Signed)
S/p one dose of kayexelate. Repeat bmet today.

## 2017-09-14 NOTE — Patient Instructions (Signed)
Great to see you. I will call you with your lab results from today and you can view them online.   

## 2017-09-14 NOTE — Progress Notes (Signed)
Subjective:   Patient ID: Wanda [name], female    DOB: 1946/05/29, 72 y.o.   MRN: [id number]  Wanda [name] is a pleasant 72 y.o. year old female who presents to clinic today with Diabetes (Patient is here today to F/U with DM.  She had labs drawn on 2.6.19. She gets her Mammogram done yearly.  She states that she neglets to take the PM dose of Metformin.)  on 09/14/2017  HPI:  DM - deteriorated. Husband is unfortunately now on hospice for lewy body dementia so she has not been walking as often. Wt Readings from Last 3 Encounters:  09/14/17 143 lb 6.4 oz (65 kg)  07/30/17 143 lb (64.9 kg)  05/06/17 143 lb 8 oz (65.1 kg)     Still taking glipizide, januvia and metformin.  Does not check FSBS regularly, denies any episodes of hypoglycemia. Lab Results  Component Value Date   HGBA1C 8.2 (H) 09/09/2017    She is on an ACEI- has h/o CKD II due to diabetes, has seen Dr. Candiss Norse in past. Cr stable.  Lab Results  Component Value Date   CREATININE 1.07 09/09/2017   HLD- on pravachol 10 mg daily. Lab Results  Component Value Date   CHOL 172 09/09/2017   HDL 35.90 (L) 09/09/2017   LDLCALC 84 05/05/2017   LDLDIRECT 104.0 09/09/2017   TRIG 205.0 (H) 09/09/2017   CHOLHDL 5 09/09/2017   Lab Results  Component Value Date   ALT 16 09/09/2017   AST 15 09/09/2017   ALKPHOS 82 09/09/2017   BILITOT 0.7 09/09/2017   Hyperkalemia- sent in one time dose of kayexalate for her last week.  She did take it as prescribed. Lab Results  Component Value Date   NA 142 09/09/2017   K 5.5 (H) 09/09/2017   CL 109 09/09/2017   CO2 27 09/09/2017    Current Outpatient Medications on File Prior to Visit  Medication Sig Dispense Refill  . busPIRone (BUSPAR) 15 MG tablet TAKE 1 TABLET BY MOUTH 2 TIMES DAILY. 180 tablet 1  . glipiZIDE (GLUCOTROL XL) 5 MG 24 hr tablet TAKE 1 TABLET BY MOUTH DAILY. 90 tablet 1  . glucose blood (ACCU-CHEK AVIVA PLUS) test strip USE AS INSTRUCTED DAILY. 50 each PRN  .  JANUVIA 100 MG tablet TAKE 1 TABLET BY MOUTH ONCE DAILY. 90 tablet 0  . lisinopril (PRINIVIL,ZESTRIL) 10 MG tablet TAKE 1 TABLET BY MOUTH ONCE A DAY. 90 tablet 3  . metFORMIN (GLUCOPHAGE-XR) 500 MG 24 hr tablet TAKE 2 TABLETS BY MOUTH DAILY WITH BREAKFAST AND 1 TABLET IN THE EVENING. 180 tablet 3  . omeprazole (PRILOSEC) 20 MG capsule Take 1 capsule (20 mg total) by mouth daily as needed. 90 capsule 3  . pravastatin (PRAVACHOL) 10 MG tablet TAKE 1 TABLET BY MOUTH DAILY. 90 tablet 2   No current facility-administered medications on file prior to visit.     Allergies  Allergen Reactions  . Niacin     REACTION: unbearable hot flashes    Past Medical History:  Diagnosis Date  . Colon polyps   . DM (diabetes mellitus) ([place])   . Fibromyalgia    "neurofibromyalgia"  . Headache   . HTN (hypertension)   . Hyperlipidemia     Past Surgical History:  Procedure Laterality Date  . WRIST SURGERY  1969    Family History  Problem Relation Age of Onset  . Heart attack Unknown   . Hypertension Mother   . Hyperlipidemia Mother   .  Stroke Mother   . Diabetes Sister   . Parkinson's disease Brother     Social History   Socioeconomic History  . Marital status: Married    Spouse name: Not on file  . Number of children: 2  . Years of education: 58  . Highest education level: Not on file  Social Needs  . Financial resource strain: Not on file  . Food insecurity - worry: Not on file  . Food insecurity - inability: Not on file  . Transportation needs - medical: Not on file  . Transportation needs - non-medical: Not on file  Occupational History    Comment: retired Pharmacist, hospital  Tobacco Use  . Smoking status: Never Smoker  . Smokeless tobacco: Never Used  Substance and Sexual Activity  . Alcohol use: No    Alcohol/week: 0.0 oz  . Drug use: No  . Sexual activity: No  Other Topics Concern  . Not on file  Social History Narrative   Lives with husband    caffeine - none     Review  of Systems  Constitutional: Negative.   HENT: Negative.   Eyes: Negative.   Respiratory: Negative.   Cardiovascular: Negative.   Gastrointestinal: Negative.   Endocrine: Negative.   Genitourinary: Negative for decreased urine volume, genital sores, hematuria, menstrual problem, pelvic pain, urgency, vaginal bleeding, vaginal discharge and vaginal pain.  Musculoskeletal: Negative.   Skin: Negative.   Allergic/Immunologic: Negative.   Neurological: Negative.   Hematological: Negative.   Psychiatric/Behavioral: Negative.   All other systems reviewed and are negative.      Objective:    BP 118/72 (BP Location: Left Arm, Patient Position: Sitting, Cuff Size: Normal)   Pulse 74   Temp 98.4 F (36.9 C) (Oral)   Ht 5' 4.5" (1.638 m)   Wt 143 lb 6.4 oz (65 kg)   SpO2 98%   BMI 24.23 kg/m   Wt Readings from Last 3 Encounters:  09/14/17 143 lb 6.4 oz (65 kg)  07/30/17 143 lb (64.9 kg)  05/06/17 143 lb 8 oz (65.1 kg)     Physical Exam  Constitutional: She is oriented to person, place, and time. She appears well-developed and well-nourished. No distress.  HENT:  Head: Normocephalic.  Eyes: Conjunctivae are normal.  Neck: Normal range of motion.  Cardiovascular: Normal rate and regular rhythm.  Pulmonary/Chest: Effort normal and breath sounds normal.  Genitourinary: Rectum normal, vagina normal and uterus normal.  Musculoskeletal: Normal range of motion. She exhibits no edema or deformity.  Neurological: She is alert and oriented to person, place, and time. No cranial nerve deficit.  Skin: Skin is warm and dry. She is not diaphoretic.  Psychiatric: She has a normal mood and affect. Her behavior is normal. Judgment and thought content normal.  Nursing note and vitals reviewed.         Assessment & Plan:   HYPERKALEMIA - Plan: Basic metabolic panel  Type 2 diabetes mellitus with diabetic nephropathy, without long-term current use of insulin (HCC) No Follow-up on  file.

## 2017-09-15 LAB — BASIC METABOLIC PANEL
BUN: 19 mg/dL (ref 6–23)
CALCIUM: 9.3 mg/dL (ref 8.4–10.5)
CO2: 28 mEq/L (ref 19–32)
CREATININE: 1.07 mg/dL (ref 0.40–1.20)
Chloride: 106 mEq/L (ref 96–112)
GFR: 53.91 mL/min — AB (ref >60.00)
Glucose, Bld: 96 mg/dL (ref 70–99)
Potassium: 4.7 mEq/L (ref 3.5–5.1)
SODIUM: 142 meq/L (ref 135–145)

## 2017-09-24 ENCOUNTER — Other Ambulatory Visit: Payer: Self-pay | Admitting: Family Medicine

## 2017-09-25 ENCOUNTER — Other Ambulatory Visit: Payer: Self-pay

## 2017-09-25 MED ORDER — SITAGLIPTIN PHOSPHATE 100 MG PO TABS: 100.0000 mg | ORAL_TABLET | Freq: Every day | ORAL | 1 refills | Status: DC

## 2017-09-25 MED ORDER — GLIPIZIDE ER 5 MG PO TB24: 5.0000 mg | ORAL_TABLET | Freq: Every day | ORAL | 1 refills | Status: DC

## 2017-09-25 MED ORDER — LISINOPRIL 10 MG PO TABS: 10.0000 mg | ORAL_TABLET | Freq: Every day | ORAL | 1 refills | Status: DC

## 2017-09-25 MED ORDER — PRAVASTATIN SODIUM 10 MG PO TABS: 10.0000 mg | ORAL_TABLET | Freq: Every day | ORAL | 1 refills | Status: DC

## 2017-09-25 MED ORDER — BUSPIRONE HCL 15 MG PO TABS: 15.0000 mg | ORAL_TABLET | Freq: Two times a day (BID) | ORAL | 1 refills | Status: DC

## 2017-09-25 MED ORDER — METFORMIN HCL ER 500 MG PO TB24: ORAL_TABLET | ORAL | 1 refills | Status: DC

## 2017-10-14 ENCOUNTER — Other Ambulatory Visit: Payer: Self-pay | Admitting: Family Medicine

## 2018-01-02 ENCOUNTER — Other Ambulatory Visit: Payer: Self-pay | Admitting: Family Medicine

## 2018-01-14 ENCOUNTER — Encounter: Payer: Self-pay | Admitting: Family Medicine

## 2018-01-14 ENCOUNTER — Ambulatory Visit: Payer: Medicare Other | Admitting: Family Medicine

## 2018-01-14 VITALS — BP 124/84 | HR 88 | Temp 98.4°F | Ht 64.5 in | Wt 143.0 lb

## 2018-01-14 DIAGNOSIS — Z1239 Encounter for other screening for malignant neoplasm of breast: Secondary | ICD-10-CM

## 2018-01-14 DIAGNOSIS — M5432 Sciatica, left side: Secondary | ICD-10-CM | POA: Diagnosis not present

## 2018-01-14 DIAGNOSIS — M5430 Sciatica, unspecified side: Secondary | ICD-10-CM | POA: Insufficient documentation

## 2018-01-14 NOTE — Progress Notes (Signed)
Subjective:   Patient ID: Wanda [name], female    DOB: 1946-03-06, 72 y.o.   MRN: [id number]  Wanda [name] is a pleasant 72 y.o. year old female who presents to clinic today with Leg Problem (Patient is here today C/O her left leg aching.  This has been going on x1wk now.  It starts in the back of left thigh down to left ankle.  Denies any injury.  No swelling or redness but states that the left vastus lateralus is sore to touch.  She agrees to get Mammogram at [facility] and will call them after referral is placed in system.)  on 01/14/2018  HPI:  Patient is here today C/O her left leg aching. This has been going on x1wk now. It starts in the back of left thigh down to left ankle. Denies any injury but she has been bending down gardening a lot more than she has been in a long time. No swelling or redness.  Does feel her left thigh feels sore or achy.  Current Outpatient Medications on File Prior to Visit  Medication Sig Dispense Refill  . busPIRone (BUSPAR) 15 MG tablet Take 1 tablet (15 mg total) by mouth 2 (two) times daily. 180 tablet 1  . glipiZIDE (GLUCOTROL XL) 5 MG 24 hr tablet Take 1 tablet (5 mg total) by mouth daily. 90 tablet 1  . glucose blood (ACCU-CHEK AVIVA PLUS) test strip USE AS INSTRUCTED DAILY. 50 each PRN  . JANUVIA 100 MG tablet TAKE 1 TABLET BY MOUTH ONCE DAILY. 90 tablet 0  . lisinopril (PRINIVIL,ZESTRIL) 10 MG tablet Take 1 tablet (10 mg total) by mouth daily. 90 tablet 1  . metFORMIN (GLUCOPHAGE-XR) 500 MG 24 hr tablet TAKE 2 TABLETS BY MOUTH DAILY WITH BREAKFAST AND 1 TABLET IN THE EVENING. 270 tablet 1  . omeprazole (PRILOSEC) 20 MG capsule TAKE 1 CAPSULE BY MOUTH DAILY AS NEEDED. 90 capsule 1  . pravastatin (PRAVACHOL) 10 MG tablet Take 1 tablet (10 mg total) by mouth daily. 90 tablet 1  . sitaGLIPtin (JANUVIA) 100 MG tablet Take 1 tablet (100 mg total) by mouth daily. 90 tablet 1   No current facility-administered medications on file prior to visit.     Allergies    Allergen Reactions  . Niacin     REACTION: unbearable hot flashes    Past Medical History:  Diagnosis Date  . Colon polyps   . DM (diabetes mellitus) ([place])   . Fibromyalgia    "neurofibromyalgia"  . Headache   . HTN (hypertension)   . Hyperlipidemia     Past Surgical History:  Procedure Laterality Date  . WRIST SURGERY  1969    Family History  Problem Relation Age of Onset  . Heart attack Unknown   . Hypertension Mother   . Hyperlipidemia Mother   . Stroke Mother   . Diabetes Sister   . Parkinson's disease Brother     Social History   Socioeconomic History  . Marital status: Married    Spouse name: Not on file  . Number of children: 2  . Years of education: 27  . Highest education level: Not on file  Occupational History    Comment: retired Pharmacist, hospital  Social Needs  . Financial resource strain: Not on file  . Food insecurity:    Worry: Not on file    Inability: Not on file  . Transportation needs:    Medical: Not on file    Non-medical: Not on file  Tobacco Use  .  Smoking status: Never Smoker  . Smokeless tobacco: Never Used  Substance and Sexual Activity  . Alcohol use: No    Alcohol/week: 0.0 oz  . Drug use: No  . Sexual activity: Never  Lifestyle  . Physical activity:    Days per week: Not on file    Minutes per session: Not on file  . Stress: Not on file  Relationships  . Social connections:    Talks on phone: Not on file    Gets together: Not on file    Attends religious service: Not on file    Active member of club or organization: Not on file    Attends meetings of clubs or organizations: Not on file    Relationship status: Not on file  . Intimate partner violence:    Fear of current or ex partner: Not on file    Emotionally abused: Not on file    Physically abused: Not on file    Forced sexual activity: Not on file  Other Topics Concern  . Not on file  Social History Narrative   Lives with husband    caffeine - none   The PMH,  PSH, Social History, Family History, Medications, and allergies have been reviewed in [facility], and have been updated if relevant.  Review of Systems  Constitutional: Negative.   Musculoskeletal: Positive for arthralgias and myalgias. Negative for back pain, gait problem, joint swelling, neck pain and neck stiffness.  Neurological: Positive for numbness.  All other systems reviewed and are negative.      Objective:    BP 124/84 (BP Location: Left Arm, Patient Position: Sitting, Cuff Size: Normal)   Pulse 88   Temp 98.4 F (36.9 C) (Oral)   Ht 5' 4.5" (1.638 m)   Wt 143 lb (64.9 kg)   SpO2 98%   BMI 24.17 kg/m    Physical Exam  Constitutional: She is oriented to person, place, and time. She appears well-developed and well-nourished. No distress.  HENT:  Head: Normocephalic and atraumatic.  Eyes: EOM are normal.  Neck: Normal range of motion.  Cardiovascular: Normal rate.  Pulmonary/Chest: Effort normal.  Musculoskeletal:       Lumbar back: She exhibits spasm.  + SLR left  Neurological: She is alert and oriented to person, place, and time.  Skin: Skin is warm and dry. She is not diaphoretic.  Psychiatric: She has a normal mood and affect. Her behavior is normal. Judgment and thought content normal.  Nursing note and vitals reviewed.         Assessment & Plan:   Screening for breast cancer - Plan: MM Digital Screening No follow-ups on file.

## 2018-01-14 NOTE — Patient Instructions (Signed)
Great to see you. You have sciatic nerve pain.  Please alleve as directed- 1 tablet once to twice daily with food for no more than 2 weeks. Please update me.

## 2018-01-14 NOTE — Assessment & Plan Note (Signed)
New- advised NSAIDs- alleve once or twice daily with food for no longer than two weeks, stay active. Call or return to clinic prn if these symptoms worsen or fail to improve as anticipated. The patient indicates understanding of these issues and agrees with the plan.

## 2018-01-29 ENCOUNTER — Other Ambulatory Visit: Payer: Self-pay | Admitting: Family Medicine

## 2018-02-03 ENCOUNTER — Other Ambulatory Visit: Payer: Self-pay | Admitting: Family Medicine

## 2018-04-05 ENCOUNTER — Encounter: Payer: Self-pay | Admitting: Family Medicine

## 2018-04-26 LAB — COLOGUARD

## 2018-05-13 ENCOUNTER — Telehealth: Payer: Self-pay | Admitting: *Deleted

## 2018-05-13 NOTE — Telephone Encounter (Signed)
Yes okay to schedule TSH, a1c, cmet and lipid panel ahead of time if she would prefer.

## 2018-05-13 NOTE — Telephone Encounter (Signed)
Copied from [place] [id number]. Topic: Appointment Scheduling - Scheduling Inquiry for Clinic >> May 13, 2018  9:38 AM Reyne Dumas L wrote: Reason for CRM:   Pt states that she has an appointment upcoming for diabetic management.  Pt wants to know if she should have bloodwork done ahead of time. Pt can be reached at [phone], OK to leave a message.

## 2018-05-17 ENCOUNTER — Other Ambulatory Visit: Payer: Medicare Other

## 2018-05-17 ENCOUNTER — Other Ambulatory Visit: Payer: Self-pay | Admitting: Family Medicine

## 2018-05-17 ENCOUNTER — Other Ambulatory Visit (INDEPENDENT_AMBULATORY_CARE_PROVIDER_SITE_OTHER): Payer: Medicare Other

## 2018-05-17 DIAGNOSIS — E1121 Type 2 diabetes mellitus with diabetic nephropathy: Secondary | ICD-10-CM

## 2018-05-17 NOTE — Telephone Encounter (Signed)
Pt called to find out if she needed to come in today for labs.  Scheduled lab appt for pt to today at 10:15am.

## 2018-05-17 NOTE — Telephone Encounter (Signed)
Labs are entered

## 2018-05-18 ENCOUNTER — Ambulatory Visit: Payer: Medicare Other | Admitting: Family Medicine

## 2018-05-18 ENCOUNTER — Other Ambulatory Visit (INDEPENDENT_AMBULATORY_CARE_PROVIDER_SITE_OTHER): Payer: Medicare Other

## 2018-05-18 DIAGNOSIS — E1121 Type 2 diabetes mellitus with diabetic nephropathy: Secondary | ICD-10-CM | POA: Diagnosis not present

## 2018-05-18 LAB — LIPID PANEL
CHOL/HDL RATIO: 5
CHOLESTEROL: 160 mg/dL (ref 0–200)
HDL: 34.5 mg/dL — ABNORMAL LOW (ref >39.00)
NONHDL: 125.86
Triglycerides: 227 mg/dL — ABNORMAL HIGH (ref 0.0–149.0)
VLDL: 45.4 mg/dL — AB (ref 0.0–40.0)

## 2018-05-18 LAB — COMPREHENSIVE METABOLIC PANEL
ALT: 15 U/L (ref 0–35)
AST: 13 U/L (ref 0–37)
Albumin: 4.2 g/dL (ref 3.5–5.2)
Alkaline Phosphatase: 131 U/L — ABNORMAL HIGH (ref 39–117)
BILIRUBIN TOTAL: 0.2 mg/dL (ref 0.2–1.2)
BUN: 18 mg/dL (ref 6–23)
CALCIUM: 9.7 mg/dL (ref 8.4–10.5)
CHLORIDE: 109 meq/L (ref 96–112)
CO2: 24 meq/L (ref 19–32)
CREATININE: 1.22 mg/dL — AB (ref 0.40–1.20)
GFR: 46.24 mL/min — AB (ref >60.00)
Glucose, Bld: 199 mg/dL — ABNORMAL HIGH (ref 70–99)
Potassium: 4.9 mEq/L (ref 3.5–5.1)
Sodium: 142 mEq/L (ref 135–145)
Total Protein: 7.5 g/dL (ref 6.0–8.3)

## 2018-05-18 LAB — LDL CHOLESTEROL, DIRECT: Direct LDL: 87 mg/dL

## 2018-05-18 LAB — HEMOGLOBIN A1C: Hgb A1c MFr Bld: 7.8 % — ABNORMAL HIGH (ref 4.6–6.5)

## 2018-05-18 LAB — TSH: TSH: 4.14 u[IU]/mL (ref 0.35–4.50)

## 2018-05-21 ENCOUNTER — Other Ambulatory Visit: Payer: Self-pay | Admitting: Family Medicine

## 2018-05-24 ENCOUNTER — Ambulatory Visit: Payer: Medicare Other | Admitting: Family Medicine

## 2018-05-24 ENCOUNTER — Encounter: Payer: Self-pay | Admitting: Family Medicine

## 2018-05-24 VITALS — BP 134/80 | HR 67 | Temp 98.0°F | Ht 65.0 in | Wt 146.4 lb

## 2018-05-24 DIAGNOSIS — N183 Chronic kidney disease, stage 3 unspecified: Secondary | ICD-10-CM

## 2018-05-24 DIAGNOSIS — E1121 Type 2 diabetes mellitus with diabetic nephropathy: Secondary | ICD-10-CM | POA: Diagnosis not present

## 2018-05-24 NOTE — Assessment & Plan Note (Signed)
Improved but has not been taking evening dose. She will restart evening dose.

## 2018-05-24 NOTE — Patient Instructions (Signed)
Great to see you. Please come see me in 3 months.

## 2018-05-24 NOTE — Progress Notes (Signed)
Subjective:   Patient ID: Wanda [name], female    DOB: 1946/05/14, 72 y.o.   MRN: [id number]  Wanda [name] is a pleasant 72 y.o. year old female who presents to clinic today with Diabetes (Patient is here today to F/U with DM.  Labs were completed on 10.15.19.  A1C was 7.8 which has decreased from 71-months-ago at 8.2.  She is not happy that it has not come down more.  She is worried about her Trig being 227 which is up from last at 205.  She says that she was fasting when labs were drawn but her BS was 199 and this am fasting was 194.  She states that she has not been taking Metformin evening dose but she will start that today.  She states that the bottom of toes on right foot hurt.)  on 05/24/2018  HPI:  DM- a1c has improved to 7.8 from 8.2 8 months ago.  She was hoping it had come down more.   She was fasting when she had her labs drawn and she is concerned about her TG being 227 and her BS was 199. This am, her fasting FSBS was 184.  She admits to only taking two of her Metformin XR 500 mg every morning and not taking her evening dose.  Lab Results  Component Value Date   HGBA1C 7.8 (H) 05/18/2018   Lab Results  Component Value Date   CHOL 160 05/18/2018   HDL 34.50 (L) 05/18/2018   LDLCALC 84 05/05/2017   LDLDIRECT 87.0 05/18/2018   TRIG 227.0 (H) 05/18/2018   CHOLHDL 5 05/18/2018   Lab Results  Component Value Date   CREATININE 1.22 (H) 05/18/2018   Current Outpatient Medications on File Prior to Visit  Medication Sig Dispense Refill  . busPIRone (BUSPAR) 15 MG tablet TAKE 1 TABLET BY MOUTH 2 TIMES DAILY. 180 tablet 1  . glipiZIDE (GLUCOTROL XL) 5 MG 24 hr tablet TAKE 1 TABLET BY MOUTH DAILY. 90 tablet 1  . glucose blood (ACCU-CHEK AVIVA PLUS) test strip USE AS INSTRUCTED DAILY. 50 each PRN  . JANUVIA 100 MG tablet TAKE 1 TABLET BY MOUTH ONCE DAILY. 90 tablet 0  . lisinopril (PRINIVIL,ZESTRIL) 10 MG tablet TAKE 1 TABLET BY MOUTH ONCE A DAY. 90 tablet 1  . metFORMIN  (GLUCOPHAGE-XR) 500 MG 24 hr tablet TAKE 2 TABLETS BY MOUTH DAILY WITH BREAKFAST AND 1 TABLET IN THE EVENING. 180 tablet 1  . omeprazole (PRILOSEC) 20 MG capsule TAKE 1 CAPSULE BY MOUTH DAILY AS NEEDED. 90 capsule 1  . pravastatin (PRAVACHOL) 10 MG tablet TAKE 1 TABLET BY MOUTH OINCE DAILY 90 tablet 1   No current facility-administered medications on file prior to visit.     Allergies  Allergen Reactions  . Niacin     REACTION: unbearable hot flashes    Past Medical History:  Diagnosis Date  . Colon polyps   . DM (diabetes mellitus) ([place])   . Fibromyalgia    "neurofibromyalgia"  . Headache   . HTN (hypertension)   . Hyperlipidemia     Past Surgical History:  Procedure Laterality Date  . WRIST SURGERY  1969    Family History  Problem Relation Age of Onset  . Heart attack Unknown   . Hypertension Mother   . Hyperlipidemia Mother   . Stroke Mother   . Diabetes Sister   . Parkinson's disease Brother     Social History   Socioeconomic History  . Marital status: Married  Spouse name: Not on file  . Number of children: 2  . Years of education: 44  . Highest education level: Not on file  Occupational History    Comment: retired Pharmacist, hospital  Social Needs  . Financial resource strain: Not on file  . Food insecurity:    Worry: Not on file    Inability: Not on file  . Transportation needs:    Medical: Not on file    Non-medical: Not on file  Tobacco Use  . Smoking status: Never Smoker  . Smokeless tobacco: Never Used  Substance and Sexual Activity  . Alcohol use: No    Alcohol/week: 0.0 standard drinks  . Drug use: No  . Sexual activity: Never  Lifestyle  . Physical activity:    Days per week: Not on file    Minutes per session: Not on file  . Stress: Not on file  Relationships  . Social connections:    Talks on phone: Not on file    Gets together: Not on file    Attends religious service: Not on file    Active member of club or organization: Not on file      Attends meetings of clubs or organizations: Not on file    Relationship status: Not on file  . Intimate partner violence:    Fear of current or ex partner: Not on file    Emotionally abused: Not on file    Physically abused: Not on file    Forced sexual activity: Not on file  Other Topics Concern  . Not on file  Social History Narrative   Lives with husband    caffeine - none   The PMH, PSH, Social History, Family History, Medications, and allergies have been reviewed in [facility], and have been updated if relevant.   Review of Systems  Constitutional: Negative.   HENT: Negative.   Eyes: Negative.   Respiratory: Negative.   Cardiovascular: Negative.   Gastrointestinal: Negative.   Endocrine: Negative.   Genitourinary: Negative.   Musculoskeletal: Negative.   Skin: Negative.   Allergic/Immunologic: Negative.   Neurological: Negative.   Hematological: Negative.   Psychiatric/Behavioral: Negative.   All other systems reviewed and are negative.      Objective:    BP 134/80 (BP Location: Left Arm, Patient Position: Sitting, Cuff Size: Normal)   Pulse 67   Temp 98 F (36.7 C) (Oral)   Ht 5\' 5"  (1.651 m)   Wt 146 lb 6.4 oz (66.4 kg)   SpO2 99%   BMI 24.36 kg/m    Wt Readings from Last 3 Encounters:  05/24/18 146 lb 6.4 oz (66.4 kg)  01/14/18 143 lb (64.9 kg)  09/14/17 143 lb 6.4 oz (65 kg)      Physical Exam  Constitutional: She is oriented to person, place, and time. She appears well-developed and well-nourished. No distress.  HENT:  Head: Normocephalic and atraumatic.  Eyes: EOM are normal.  Neck: Normal range of motion.  Cardiovascular: Normal rate and regular rhythm.  Pulmonary/Chest: Effort normal and breath sounds normal.  Musculoskeletal: Normal range of motion. She exhibits no edema.  Neurological: She is alert and oriented to person, place, and time. No cranial nerve deficit.  Skin: Skin is warm and dry. She is not diaphoretic.  Psychiatric: She has a  normal mood and affect. Her behavior is normal. Judgment and thought content normal.  Nursing note and vitals reviewed.         Assessment & Plan:   Type 2  diabetes mellitus with diabetic nephropathy, without long-term current use of insulin (HCC)  CKD (chronic kidney disease) stage 3, GFR 30-59 ml/min (HCC) No follow-ups on file.

## 2018-05-24 NOTE — Assessment & Plan Note (Signed)
Deteriorated. Push fluids- aim for 64 ounces per day. She will come see me in 3 months.

## 2018-05-25 ENCOUNTER — Encounter: Payer: Self-pay | Admitting: Family Medicine

## 2018-05-25 LAB — HM DIABETES EYE EXAM

## 2018-06-23 ENCOUNTER — Other Ambulatory Visit: Payer: Self-pay | Admitting: Family Medicine

## 2018-07-08 ENCOUNTER — Ambulatory Visit: Payer: Medicare Other | Admitting: Family Medicine

## 2018-07-08 ENCOUNTER — Ambulatory Visit: Payer: Self-pay

## 2018-07-08 ENCOUNTER — Encounter: Payer: Self-pay | Admitting: Family Medicine

## 2018-07-08 VITALS — BP 130/78 | HR 90 | Temp 97.7°F | Wt 147.2 lb

## 2018-07-08 DIAGNOSIS — M545 Low back pain, unspecified: Secondary | ICD-10-CM

## 2018-07-08 LAB — POC URINALSYSI DIPSTICK (AUTOMATED)
Bilirubin, UA: NEGATIVE
Glucose, UA: POSITIVE — AB
Ketones, UA: NEGATIVE
Leukocytes, UA: NEGATIVE
Nitrite, UA: NEGATIVE
PH UA: 5 (ref 5.0–8.0)
PROTEIN UA: NEGATIVE
RBC UA: NEGATIVE
Urobilinogen, UA: 0.2 E.U./dL

## 2018-07-08 MED ORDER — MELOXICAM 7.5 MG PO TABS: 7.5000 mg | ORAL_TABLET | Freq: Every day | ORAL | 0 refills | Status: DC

## 2018-07-08 MED ORDER — BACLOFEN 10 MG PO TABS: 10.0000 mg | ORAL_TABLET | Freq: Three times a day (TID) | ORAL | 0 refills | Status: DC | PRN

## 2018-07-08 NOTE — Assessment & Plan Note (Signed)
-  UA without signs of infection or stone -Given handout on back pain -Rx for meloxicam and baclofen -May continue muscle rub as well.

## 2018-07-08 NOTE — Telephone Encounter (Signed)
Pt c/o left hip and left lower back pain. Pt stated the pain began 3 days ago. Pt c/o soreness that is constant. Pt stated that if she moves or does activities, she has back spasms. Pt denies any heavy lifting, strenuous work or exercise. Pt is using [facility]raim Hamburger for the discomfort. Pt denies any weakness, numbness or problems with bowel and bladder control. Pt denies fever, abdominal pain, burning with urination or blood in urine. Care advice given and pt verbalized understanding. No available appointment with PCP. Appt made with Luetta Nutting DO for 11:00 this morning.  Reason for Disposition . [1] MODERATE back pain (e.g., interferes with normal activities) AND [2] present > 3 days  Answer Assessment - Initial Assessment Questions 1. ONSET: "When did the pain begin?"      3 days ago 2. LOCATION: "Where does it hurt?" (upper, mid or lower back)     Left hip to the back of the waist - mostly on the side as it goes up to the back 3. SEVERITY: "How bad is the pain?"  (e.g., Scale 1-10; mild, moderate, or severe)   - MILD (1-3): doesn't interfere with normal activities    - MODERATE (4-7): interferes with normal activities or awakens from sleep    - SEVERE (8-10): excruciating pain, unable to do any normal activities      moderate 4. PATTERN: "Is the pain constant?" (e.g., yes, no; constant, intermittent)      Soreness constant- today sore unless move a certain way then back spasms 5. RADIATION: "Does the pain shoot into your legs or elsewhere?"     no 6. CAUSE:  "What do you think is causing the back pain?"      Pt does not know 7. BACK OVERUSE:  "Any recent lifting of heavy objects, strenuous work or exercise?"     no 8. MEDICATIONS: "What have you taken so far for the pain?" (e.g., nothing, acetaminophen, NSAIDS)     BenGay 9. NEUROLOGIC SYMPTOMS: "Do you have any weakness, numbness, or problems with bowel/bladder control?"    no 10. OTHER SYMPTOMS: "Do you have any other symptoms?" (e.g.,  fever, abdominal pain, burning with urination, blood in urine)       No occasional headache. 11. PREGNANCY: "Is there any chance you are pregnant?" (e.g., yes, no; LMP)       n/a  Protocols used: BACK PAIN-A-AH

## 2018-07-08 NOTE — Progress Notes (Signed)
Wanda [name] - 72 y.o. female MRN [id number]  Date of birth: [birth year]  Subjective Chief Complaint  Patient presents with  . Back Pain    lower back/started monday    HPI Wanda [name] is a 72 y.o. female with history of fibromyalgia here today with complaint of L sided lower back pain.  She reports that pain started about 3-4 days ago.  Pain does not radiate.  She denies urinary frequency, urgency, dysuria or hematuria.  She denies any known injury or overuse.  Pain is worse with movement and nearly goes away at rest.  She has not had any numbness, tingling or weakness in lower extremities.  She has tried ben gay which provided some relief.   ROS:  A comprehensive ROS was completed and negative except as noted per HPI  Allergies  Allergen Reactions  . Niacin     REACTION: unbearable hot flashes    Past Medical History:  Diagnosis Date  . Colon polyps   . DM (diabetes mellitus) ([place])   . Fibromyalgia    "neurofibromyalgia"  . Headache   . HTN (hypertension)   . Hyperlipidemia     Past Surgical History:  Procedure Laterality Date  . WRIST SURGERY  1969    Social History   Socioeconomic History  . Marital status: Married    Spouse name: Not on file  . Number of children: 2  . Years of education: 61  . Highest education level: Not on file  Occupational History    Comment: retired Pharmacist, hospital  Social Needs  . Financial resource strain: Not on file  . Food insecurity:    Worry: Not on file    Inability: Not on file  . Transportation needs:    Medical: Not on file    Non-medical: Not on file  Tobacco Use  . Smoking status: Never Smoker  . Smokeless tobacco: Never Used  Substance and Sexual Activity  . Alcohol use: No    Alcohol/week: 0.0 standard drinks  . Drug use: No  . Sexual activity: Never  Lifestyle  . Physical activity:    Days per week: Not on file    Minutes per session: Not on file  . Stress: Not on file  Relationships  . Social connections:   Talks on phone: Not on file    Gets together: Not on file    Attends religious service: Not on file    Active member of club or organization: Not on file    Attends meetings of clubs or organizations: Not on file    Relationship status: Not on file  Other Topics Concern  . Not on file  Social History Narrative   Lives with husband    caffeine - none    Family History  Problem Relation Age of Onset  . Heart attack Unknown   . Hypertension Mother   . Hyperlipidemia Mother   . Stroke Mother   . Diabetes Sister   . Parkinson's disease Brother     Health Maintenance  Topic Date Due  . INFLUENZA VACCINE  03/04/2018  . TETANUS/TDAP  01/15/2019 (Originally 08/18/2017)  . HEMOGLOBIN A1C  11/17/2018  . MAMMOGRAM  03/18/2019  . FOOT EXAM  05/25/2019  . OPHTHALMOLOGY EXAM  05/26/2019  . Fecal DNA (Cologuard)  04/26/2021  . DEXA SCAN  Completed  . Hepatitis C Screening  Completed  . PNA vac Low Risk Adult  Completed    ----------------------------------------------------------------------------------------------------------------------------------------------------------------------------------------------------------------- Physical Exam BP 130/78   Pulse 90  Temp 97.7 F (36.5 C)   Wt 147 lb 3.2 oz (66.8 kg)   SpO2 99%   BMI 24.50 kg/m   Physical Exam  Constitutional: She is oriented to person, place, and time. She appears well-nourished. No distress.  HENT:  Head: Normocephalic and atraumatic.  Mouth/Throat: Oropharynx is clear and moist. No oropharyngeal exudate.  Eyes: No scleral icterus.  Neck: Neck supple. No thyromegaly present.  Cardiovascular: Normal rate, regular rhythm and normal heart sounds.  Pulmonary/Chest: Effort normal and breath sounds normal.  Musculoskeletal:  ROM of lumbar spine is normal with increased pain on extension and single legged hyperextention on the L.    Mild ttp along lumbar paraspinals on L   Neurological: She is alert and oriented  to person, place, and time. She displays normal reflexes. She exhibits normal muscle tone.  Skin: Skin is warm and dry.  Psychiatric: She has a normal mood and affect. Her behavior is normal.    ------------------------------------------------------------------------------------------------------------------------------------------------------------------------------------------------------------------- Assessment and Plan  Low back pain -UA without signs of infection or stone -Given handout on back pain -Rx for meloxicam and baclofen -May continue muscle rub as well.

## 2018-07-08 NOTE — Patient Instructions (Signed)

## 2018-07-13 NOTE — Progress Notes (Signed)
   Subjective:   Patient ID: Wanda [name], female    DOB: 1945/11/16, 72 y.o.   MRN: [id number]  [name] is a pleasant 72 y.o. year old female who presents to clinic today with No chief complaint on file.  on 07/15/2018  HPI: Office visit canceled- not due yet for follow up until after 08/24/18 but since she is already here, we will check renal function.

## 2018-07-15 ENCOUNTER — Encounter (INDEPENDENT_AMBULATORY_CARE_PROVIDER_SITE_OTHER): Payer: Medicare Other | Admitting: Family Medicine

## 2018-07-15 ENCOUNTER — Encounter: Payer: Self-pay | Admitting: Family Medicine

## 2018-07-15 DIAGNOSIS — E1121 Type 2 diabetes mellitus with diabetic nephropathy: Secondary | ICD-10-CM

## 2018-07-15 DIAGNOSIS — N183 Chronic kidney disease, stage 3 unspecified: Secondary | ICD-10-CM

## 2018-07-15 DIAGNOSIS — Z23 Encounter for immunization: Secondary | ICD-10-CM

## 2018-07-15 DIAGNOSIS — E875 Hyperkalemia: Secondary | ICD-10-CM

## 2018-07-15 DIAGNOSIS — I10 Essential (primary) hypertension: Secondary | ICD-10-CM

## 2018-07-15 DIAGNOSIS — F4321 Adjustment disorder with depressed mood: Secondary | ICD-10-CM | POA: Insufficient documentation

## 2018-07-15 LAB — RENAL FUNCTION PANEL
ALBUMIN: 4.1 g/dL (ref 3.5–5.2)
BUN: 20 mg/dL (ref 6–23)
CO2: 25 mEq/L (ref 19–32)
Calcium: 9.4 mg/dL (ref 8.4–10.5)
Chloride: 108 mEq/L (ref 96–112)
Creatinine, Ser: 0.99 mg/dL (ref 0.40–1.20)
GFR: 58.82 mL/min — ABNORMAL LOW (ref >60.00)
GLUCOSE: 153 mg/dL — AB (ref 70–99)
Phosphorus: 3.5 mg/dL (ref 2.3–4.6)
Potassium: 4.2 mEq/L (ref 3.5–5.1)
Sodium: 141 mEq/L (ref 135–145)

## 2018-07-15 NOTE — Patient Instructions (Signed)
Please schedule a diabetes follow up after 08/24/18.  Happy Holidays.

## 2018-08-20 ENCOUNTER — Telehealth: Payer: Self-pay | Admitting: Family Medicine

## 2018-08-20 DIAGNOSIS — E875 Hyperkalemia: Secondary | ICD-10-CM

## 2018-08-20 DIAGNOSIS — N183 Chronic kidney disease, stage 3 unspecified: Secondary | ICD-10-CM

## 2018-08-20 DIAGNOSIS — E1121 Type 2 diabetes mellitus with diabetic nephropathy: Secondary | ICD-10-CM

## 2018-08-20 NOTE — Telephone Encounter (Signed)
Copied from [place] [id number]. Topic: Quick Communication - See Telephone Encounter >> Aug 20, 2018  3:08 PM Blase Mess A wrote: CRM for notification. See Telephone encounter for: 08/20/18.  Patient is calling to see if she is to get blood work done prior to her 1/22 apt for her diabetes follow up. Please advise [phone] VM

## 2018-08-23 ENCOUNTER — Other Ambulatory Visit: Payer: Self-pay | Admitting: Family Medicine

## 2018-08-23 NOTE — Telephone Encounter (Signed)
Please send this lady in 30 days of metformin and have her fu with Dr. Marjory Lies.

## 2018-08-24 ENCOUNTER — Ambulatory Visit: Payer: Medicare Other | Admitting: Family Medicine

## 2018-08-24 NOTE — Telephone Encounter (Signed)
Patient is calling back regarding a message she got from the office.  Told patient that the doctor wanted her to come in for labs.  Patient explained that she already had something to eat today and wanted to know if it should be fasting labs.  Please call patient to explain.  CB# [phone]

## 2018-08-24 NOTE — Telephone Encounter (Signed)
Pt following up on request to do her labs in advance.

## 2018-08-24 NOTE — Telephone Encounter (Signed)
Left vm for pt to come in todays for labs per dpr can leave detailed message.

## 2018-08-24 NOTE — Telephone Encounter (Signed)
Yes okay for her to come in for labs.  Labs ordered.

## 2018-08-24 NOTE — Addendum Note (Signed)
Addended by: Lucille Passy on: 08/24/2018 11:22 AM   Modules accepted: Orders

## 2018-08-24 NOTE — Telephone Encounter (Signed)
Spoke with pt and she will just do her labs tomorrow when she comes in for her appt

## 2018-08-24 NOTE — Telephone Encounter (Signed)
Dr. Deborra Medina is it ok for pt to come in today for labs? Possibly results may be back in before her appt tomorrow since labs are going to our [place] lab for testing.

## 2018-08-25 ENCOUNTER — Ambulatory Visit: Payer: Medicare Other | Admitting: Family Medicine

## 2018-08-25 ENCOUNTER — Encounter: Payer: Self-pay | Admitting: Family Medicine

## 2018-08-25 VITALS — BP 116/62 | HR 72 | Temp 98.2°F | Ht 65.0 in | Wt 145.0 lb

## 2018-08-25 DIAGNOSIS — E1121 Type 2 diabetes mellitus with diabetic nephropathy: Secondary | ICD-10-CM

## 2018-08-25 DIAGNOSIS — N183 Chronic kidney disease, stage 3 unspecified: Secondary | ICD-10-CM

## 2018-08-25 DIAGNOSIS — E785 Hyperlipidemia, unspecified: Secondary | ICD-10-CM | POA: Diagnosis not present

## 2018-08-25 LAB — COMPREHENSIVE METABOLIC PANEL
ALT: 16 U/L (ref 0–35)
AST: 16 U/L (ref 0–37)
Albumin: 4.2 g/dL (ref 3.5–5.2)
Alkaline Phosphatase: 93 U/L (ref 39–117)
BUN: 20 mg/dL (ref 6–23)
CO2: 26 mEq/L (ref 19–32)
Calcium: 9.9 mg/dL (ref 8.4–10.5)
Chloride: 106 mEq/L (ref 96–112)
Creatinine, Ser: 1.15 mg/dL (ref 0.40–1.20)
GFR: 46.54 mL/min — AB (ref >60.00)
GLUCOSE: 194 mg/dL — AB (ref 70–99)
Potassium: 5.3 mEq/L — ABNORMAL HIGH (ref 3.5–5.1)
Sodium: 141 mEq/L (ref 135–145)
Total Bilirubin: 0.3 mg/dL (ref 0.2–1.2)
Total Protein: 7.4 g/dL (ref 6.0–8.3)

## 2018-08-25 LAB — CBC
HCT: 38.6 % (ref 36.0–46.0)
Hemoglobin: 12.6 g/dL (ref 12.0–15.0)
MCHC: 32.7 g/dL (ref 30.0–36.0)
MCV: 93.5 fl (ref 78.0–100.0)
Platelets: 270 10*3/uL (ref 150.0–400.0)
RBC: 4.13 Mil/uL (ref 3.87–5.11)
RDW: 13.2 % (ref 11.5–15.5)
WBC: 6.4 10*3/uL (ref 4.0–10.5)

## 2018-08-25 LAB — HEMOGLOBIN A1C: Hgb A1c MFr Bld: 8.3 % — ABNORMAL HIGH (ref 4.6–6.5)

## 2018-08-25 LAB — LDL CHOLESTEROL, DIRECT: Direct LDL: 85 mg/dL

## 2018-08-25 LAB — LIPID PANEL
Cholesterol: 188 mg/dL (ref 0–200)
HDL: 36.1 mg/dL — ABNORMAL LOW (ref >39.00)
NONHDL: 151.68
Total CHOL/HDL Ratio: 5
Triglycerides: 331 mg/dL — ABNORMAL HIGH (ref 0.0–149.0)
VLDL: 66.2 mg/dL — ABNORMAL HIGH (ref 0.0–40.0)

## 2018-08-25 LAB — POCT GLYCOSYLATED HEMOGLOBIN (HGB A1C)
HbA1c POC (<> result, manual entry): 7.7 % (ref 4.0–5.6)
Hemoglobin A1C: 7.7 % — AB (ref 4.0–5.6)

## 2018-08-25 MED ORDER — GLIPIZIDE ER 5 MG PO TB24: 5.0000 mg | ORAL_TABLET | Freq: Every day | ORAL | 1 refills | Status: DC

## 2018-08-25 MED ORDER — BUSPIRONE HCL 15 MG PO TABS: 15.0000 mg | ORAL_TABLET | Freq: Two times a day (BID) | ORAL | 1 refills | Status: DC

## 2018-08-25 MED ORDER — PRAVASTATIN SODIUM 10 MG PO TABS: ORAL_TABLET | ORAL | 1 refills | Status: DC

## 2018-08-25 MED ORDER — OMEPRAZOLE 20 MG PO CPDR: 20.0000 mg | DELAYED_RELEASE_CAPSULE | Freq: Every day | ORAL | 1 refills | Status: DC | PRN

## 2018-08-25 MED ORDER — LISINOPRIL 10 MG PO TABS: 10.0000 mg | ORAL_TABLET | Freq: Every day | ORAL | 1 refills | Status: DC

## 2018-08-25 MED ORDER — SITAGLIPTIN PHOSPHATE 100 MG PO TABS: 100.0000 mg | ORAL_TABLET | Freq: Every day | ORAL | 1 refills | Status: DC

## 2018-08-25 NOTE — Patient Instructions (Addendum)
Great to see you. I will call you with your lab results from today and you can view them online.   Come see me in 3 months- schedule lab visit prior to seeing me.

## 2018-08-25 NOTE — Progress Notes (Signed)
Subjective:   Patient ID: Wanda [name], female    DOB: 1945-11-22, 73 y.o.   MRN: [id number]  Wanda [name] is a pleasant 73 y.o. year old female who presents to clinic today with Follow-up (Patient is here today to F/U with DM.  At 10.15.19 visit her A1C had come down slightly to 7.8 from what it had been on 2.6.19 at 8.2.  She admitted that she had not been taking her pm dosage of Metformin but agreed to start that.  She was also advised to push 64oz of fluids daily as well.  She states that she still seems to forget the pm dosage of Metformin quite often.)  on 08/25/2018  HPI:  DM-  Last saw her for on 10/21/9.  Note reviewed.  Her a1c had come down slightly to 7.8 from 8.2 in 09/2017.  Had been under a lot of stress with the death of her husband.  She admitted to not taking the evening dose of Metformin and she is still forgetting to do this.  She really thinks now that the holidays are over, she will do better with this.  Denies any episodes of hypoglycemia.  Lab Results  Component Value Date   HGBA1C 7.7 (A) 08/25/2018   HGBA1C 7.7 08/25/2018    Current Outpatient Medications on File Prior to Visit  Medication Sig Dispense Refill  . glucose blood (ACCU-CHEK AVIVA PLUS) test strip USE AS INSTRUCTED DAILY. 50 each PRN  . metFORMIN (GLUCOPHAGE-XR) 500 MG 24 hr tablet TAKE 2 TABLETS BY MOUTH DAILY WITH BREAKFAST AND 1 TABLET IN THE EVENING. 180 tablet 1   No current facility-administered medications on file prior to visit.     Allergies  Allergen Reactions  . Niacin     REACTION: unbearable hot flashes    Past Medical History:  Diagnosis Date  . Colon polyps   . DM (diabetes mellitus) ([place])   . Fibromyalgia    "neurofibromyalgia"  . Headache   . HTN (hypertension)   . Hyperlipidemia     Past Surgical History:  Procedure Laterality Date  . WRIST SURGERY  1969    Family History  Problem Relation Age of Onset  . Heart attack Unknown   . Hypertension Mother   .  Hyperlipidemia Mother   . Stroke Mother   . Diabetes Sister   . Parkinson's disease Brother     Social History   Socioeconomic History  . Marital status: Married    Spouse name: Not on file  . Number of children: 2  . Years of education: 41  . Highest education level: Not on file  Occupational History    Comment: retired Pharmacist, hospital  Social Needs  . Financial resource strain: Not on file  . Food insecurity:    Worry: Not on file    Inability: Not on file  . Transportation needs:    Medical: Not on file    Non-medical: Not on file  Tobacco Use  . Smoking status: Never Smoker  . Smokeless tobacco: Never Used  Substance and Sexual Activity  . Alcohol use: No    Alcohol/week: 0.0 standard drinks  . Drug use: No  . Sexual activity: Never  Lifestyle  . Physical activity:    Days per week: Not on file    Minutes per session: Not on file  . Stress: Not on file  Relationships  . Social connections:    Talks on phone: Not on file    Gets together: Not on file  Attends religious service: Not on file    Active member of club or organization: Not on file    Attends meetings of clubs or organizations: Not on file    Relationship status: Not on file  . Intimate partner violence:    Fear of current or ex partner: Not on file    Emotionally abused: Not on file    Physically abused: Not on file    Forced sexual activity: Not on file  Other Topics Concern  . Not on file  Social History Narrative   Lives with husband    caffeine - none   The PMH, PSH, Social History, Family History, Medications, and allergies have been reviewed in [facility], and have been updated if relevant.    Review of Systems  Constitutional: Negative.   HENT: Negative.   Eyes: Negative.   Respiratory: Negative.   Cardiovascular: Negative.   Gastrointestinal: Negative.   Endocrine: Negative.   Genitourinary: Negative.   Musculoskeletal: Negative.   Allergic/Immunologic: Negative.   Neurological:  Negative.   Psychiatric/Behavioral: Negative.   All other systems reviewed and are negative.      Objective:    BP 116/62 (BP Location: Left Arm, Patient Position: Sitting, Cuff Size: Normal)   Pulse 72   Temp 98.2 F (36.8 C) (Oral)   Ht 5\' 5"  (1.651 m)   Wt 145 lb (65.8 kg)   SpO2 100%   BMI 24.13 kg/m    Physical Exam    General:  Well-developed,well-nourished,in no acute distress; alert,appropriate and cooperative throughout examination Head:  normocephalic and atraumatic.   Eyes:  vision grossly intact, PERRL Ears:  R ear normal and L ear normal externally, TMs clear bilaterally Nose:  no external deformity.   Mouth:  good dentition.   Neck:  No deformities, masses, or tenderness noted.   Lungs:  Normal respiratory effort, chest expands symmetrically. Lungs are clear to auscultation, no crackles or wheezes. Heart:  Normal rate and regular rhythm. S1 and S2 normal without gallop, murmur, click, rub or other extra sounds. Abdomen:  Bowel sounds positive,abdomen soft and non-tender without masses, organomegaly or hernias noted. Msk:  No deformity or scoliosis noted of thoracic or lumbar spine.   Extremities:  No clubbing, cyanosis, edema, or deformity noted with normal full range of motion of all joints.   Neurologic:  alert & oriented X3 and gait normal.   Skin:  Intact without suspicious lesions or rashes Cervical Nodes:  No lymphadenopathy noted Axillary Nodes:  No palpable lymphadenopathy Psych:  Cognition and judgment appear intact. Alert and cooperative with normal attention span and concentration. No apparent delusions, illusions, hallucinations      Assessment & Plan:   Type 2 diabetes mellitus with diabetic nephropathy, without long-term current use of insulin (HCC) - Plan: POCT HgB A1C, Hemoglobin A1c  CKD (chronic kidney disease) stage 3, GFR 30-59 ml/min (HCC) - Plan: Comprehensive metabolic panel  Hyperlipidemia, unspecified hyperlipidemia type - Plan:  Lipid panel No follow-ups on file.

## 2018-08-25 NOTE — Assessment & Plan Note (Signed)
Improved but not at goal. >15 minutes spent in face to face time with patient, >50% spent in counselling or coordination of care.  She now feels she can concentrate on herself after trying to take care of everyone else during the first holiday since her husband's passing. She wants to give it another 3 months to do better about taking her nighttime dose of Metformin and follow up in 3 months. The patient indicates understanding of these issues and agrees with the plan.

## 2018-08-26 ENCOUNTER — Other Ambulatory Visit: Payer: Self-pay | Admitting: Family Medicine

## 2018-08-26 NOTE — Telephone Encounter (Signed)
Patient was in to see the provider yesterday, 08/25/2018 and they forgot to send in the refill for her Metformin medication and she is completely out of the medication.  Please refill the medication ASAP and have it sent to her preferred pharmacy [facility] on [facility].

## 2018-08-27 ENCOUNTER — Telehealth: Payer: Self-pay

## 2018-08-27 MED ORDER — PRAVASTATIN SODIUM 20 MG PO TABS: ORAL_TABLET | ORAL | 3 refills | Status: DC

## 2018-08-27 NOTE — Telephone Encounter (Signed)
-----   Message from Lucille Passy, MD sent at 08/26/2018 12:11 PM EST ----- Let's increase her pravachol to 20 mg daily. Also, Weight loss (even small amount) can decrease triglycerides.  Decrease added sugars, eliminate trans fats, increase fiber and limit alcohol.  All these changes together can drop triglycerides by almost 50%. Also please fax results to her nephrologist and make sure she has an appointment.

## 2018-08-27 NOTE — Telephone Encounter (Signed)
PEC ok to give lab results/I LMOVM stating that TA suggests to increase cholesterol medication to 20mg  and I would send in a new Rx for this/also stated that I faxed labs to specialist and to RTC to get full information from lab results/thx dmf

## 2018-09-23 ENCOUNTER — Encounter: Payer: Self-pay | Admitting: Family Medicine

## 2018-10-27 LAB — CBC AND DIFFERENTIAL
HCT: 36 (ref 36–46)
Hemoglobin: 12 (ref 12.0–16.0)
Neutrophils Absolute: 4040
Platelets: 368 (ref 150–399)
WBC: 6.7

## 2018-10-27 LAB — BASIC METABOLIC PANEL
BUN: 19 (ref 4–21)
Creatinine: 1.1 (ref 0.5–1.1)
Glucose: 149
Potassium: 5.1 (ref 3.4–5.3)
Sodium: 140 (ref 137–147)

## 2018-11-16 NOTE — Progress Notes (Deleted)
Virtual Visit via Video Note  I connected with Wanda [name] on 11/16/18 at 11:00 AM EDT by a video enabled telemedicine application and verified that I am speaking with the correct person using two identifiers.   THIS ENCOUNTER IS A VIRTUAL VISIT DUE TO COVID-19 - PATIENT WAS NOT SEEN IN THE OFFICE. PATIENT HAS CONSENTED TO VIRTUAL VISIT / TELEMEDICINE VISIT   Location of patient: home  Location of provider: office  I discussed the limitations of evaluation and management by telemedicine and the availability of in person appointments. The patient expressed understanding and agreed to proceed.   Subjective:   Wanda [name] is a 73 y.o. female who presents for Medicare Annual (Subsequent) preventive examination.  Review of Systems: No ROS.  Medicare Wellness Visit. Additional risk factors are reflected in the social history.   Sleep patterns: Home Safety/Smoke Alarms: Feels safe in home. Smoke alarms in place.     Female:   Mammo- 03/17/18       Dexa scan-        CCS- 03/24/13. Normal per abstract entry.     Objective:     Vitals: There were no vitals taken for this visit.  There is no height or weight on file to calculate BMI.  Advanced Directives 02/20/2016  Does Patient Have a Medical Advance Directive? Yes  Type of Advance Directive Living will;Healthcare Power of Attorney  Does patient want to make changes to medical advance directive? Yes - information given    Tobacco Social History   Tobacco Use  Smoking Status Never Smoker  Smokeless Tobacco Never Used     Counseling given: Not Answered   Clinical Intake:                       Past Medical History:  Diagnosis Date  . Colon polyps   . DM (diabetes mellitus) ([place])   . Fibromyalgia    "neurofibromyalgia"  . Headache   . HTN (hypertension)   . Hyperlipidemia    Past Surgical History:  Procedure Laterality Date  . WRIST SURGERY  1969   Family History  Problem Relation Age of Onset  . Heart  attack Unknown   . Hypertension Mother   . Hyperlipidemia Mother   . Stroke Mother   . Diabetes Sister   . Parkinson's disease Brother    Social History   Socioeconomic History  . Marital status: Married    Spouse name: Not on file  . Number of children: 2  . Years of education: 20  . Highest education level: Not on file  Occupational History    Comment: retired Pharmacist, hospital  Social Needs  . Financial resource strain: Not on file  . Food insecurity:    Worry: Not on file    Inability: Not on file  . Transportation needs:    Medical: Not on file    Non-medical: Not on file  Tobacco Use  . Smoking status: Never Smoker  . Smokeless tobacco: Never Used  Substance and Sexual Activity  . Alcohol use: No    Alcohol/week: 0.0 standard drinks  . Drug use: No  . Sexual activity: Never  Lifestyle  . Physical activity:    Days per week: Not on file    Minutes per session: Not on file  . Stress: Not on file  Relationships  . Social connections:    Talks on phone: Not on file    Gets together: Not on file    Attends religious service:  Not on file    Active member of club or organization: Not on file    Attends meetings of clubs or organizations: Not on file    Relationship status: Not on file  Other Topics Concern  . Not on file  Social History Narrative   Lives with husband    caffeine - none    Outpatient Encounter Medications as of 11/17/2018  Medication Sig  . busPIRone (BUSPAR) 15 MG tablet Take 1 tablet (15 mg total) by mouth 2 (two) times daily.  Marland Kitchen glipiZIDE (GLUCOTROL XL) 5 MG 24 hr tablet Take 1 tablet (5 mg total) by mouth daily.  Marland Kitchen glucose blood (ACCU-CHEK AVIVA PLUS) test strip USE AS INSTRUCTED DAILY.  Marland Kitchen lisinopril (PRINIVIL,ZESTRIL) 10 MG tablet Take 1 tablet (10 mg total) by mouth daily.  . metFORMIN (GLUCOPHAGE-XR) 500 MG 24 hr tablet TAKE 2 TABLETS BY MOUTH DAILY WITH BREAKFAST AND 1 TABLET IN THE EVENING.  Marland Kitchen omeprazole (PRILOSEC) 20 MG capsule Take 1 capsule  (20 mg total) by mouth daily as needed.  . pravastatin (PRAVACHOL) 20 MG tablet Take 1qd with evening meal  . sitaGLIPtin (JANUVIA) 100 MG tablet Take 1 tablet (100 mg total) by mouth daily.   No facility-administered encounter medications on file as of 11/17/2018.     Activities of Daily Living In your present state of health, do you have any difficulty performing the following activities: 07/15/2018  Hearing? N  Vision? N  Difficulty concentrating or making decisions? N  Walking or climbing stairs? N  Dressing or bathing? N  Doing errands, shopping? N  Some recent data might be hidden    Patient Care Team: Lucille Passy, MD as PCP - Jeannie Fend, MD as Consulting Physician (Ophthalmology)    Assessment:   This is a routine wellness examination for Wanda [name]. Physical assessment deferred to PCP.  Exercise Activities and Dietary recommendations   Diet (meal preparation, eat out, water intake, caffeinated beverages, dairy products, fruits and vegetables): {Desc; diets:16563} Breakfast: Lunch:  Dinner:      Goals    . Increase physical activity     Starting 02/20/16, I will continue to exercise at gym at least 60 minutes as tolerated.        Fall Risk Fall Risk  07/15/2018 09/14/2017 04/23/2016 03/19/2016 02/20/2016  Falls in the past year? 0 No No No No  Follow up Falls evaluation completed - - - -    Depression Screen PHQ 2/9 Scores 07/15/2018 09/14/2017 02/20/2016 01/25/2014  PHQ - 2 Score 2 0 0 0  PHQ- 9 Score 11 - - -     Cognitive Function Ad8 score reviewed for issues:  Issues making decisions:  Less interest in hobbies / activities:  Repeats questions, stories (family complaining):  Trouble using ordinary gadgets (microwave, computer, phone):  Forgets the month or year:   Mismanaging finances:   Remembering appts:  Daily problems with thinking and/or memory: Ad8 score is=     MMSE - Mini Mental State Exam 02/20/2016  Orientation to time 5   Orientation to Place 5  Registration 3  Attention/ Calculation 0  Recall 3  Language- name 2 objects 0  Language- repeat 1  Language- follow 3 step command 3  Language- read & follow direction 0  Write a sentence 0  Copy design 0  Total score 20        Immunization History  Administered Date(s) Administered  . Influenza Split 06/03/2012  . Influenza,inj,Quad PF,6+ Mos 08/10/2013, 05/03/2014,  05/06/2017, 07/15/2018  . PPD Test 04/19/2013  . Pneumococcal Conjugate-13 10/02/2014  . Pneumococcal Polysaccharide-23 04/25/2013  . Td 08/19/2007    Screening Tests Health Maintenance  Topic Date Due  . TETANUS/TDAP  01/15/2019 (Originally 08/18/2017)  . HEMOGLOBIN A1C  02/23/2019  . INFLUENZA VACCINE  03/05/2019  . MAMMOGRAM  03/18/2019  . FOOT EXAM  05/25/2019  . OPHTHALMOLOGY EXAM  05/26/2019  . Fecal DNA (Cologuard)  04/26/2021  . DEXA SCAN  Completed  . Hepatitis C Screening  Completed  . PNA vac Low Risk Adult  Completed      Plan:   ***   I have personally reviewed and noted the following in the patient's chart:   . Medical and social history . Use of alcohol, tobacco or illicit drugs  . Current medications and supplements . Functional ability and status . Nutritional status . Physical activity . Advanced directives . List of other physicians . Hospitalizations, surgeries, and ER visits in previous 12 months . Vitals . Screenings to include cognitive, depression, and falls . Referrals and appointments  In addition, I have reviewed and discussed with patient certain preventive protocols, quality metrics, and best practice recommendations. A written personalized care plan for preventive services as well as general preventive health recommendations were provided to patient.     Shela Nevin, [place]  11/16/2018

## 2018-11-17 ENCOUNTER — Ambulatory Visit: Payer: Medicare Other | Admitting: *Deleted

## 2018-11-17 NOTE — Progress Notes (Deleted)
Virtual Visit via Video Note  I connected with patient on 11/17/18 at  3:00 PM EDT by a video enabled telemedicine application and verified that I am speaking with the correct person using two identifiers.   THIS ENCOUNTER IS A VIRTUAL VISIT DUE TO COVID-19 - PATIENT WAS NOT SEEN IN THE OFFICE. PATIENT HAS CONSENTED TO VIRTUAL VISIT / TELEMEDICINE VISIT   Location of patient: home  Location of provider: office  I discussed the limitations of evaluation and management by telemedicine and the availability of in person appointments. The patient expressed understanding and agreed to proceed.   Subjective:   Wanda [name] is a 73 y.o. female who presents for Medicare Annual (Subsequent) preventive examination.  Review of Systems: No ROS.  Medicare Wellness Visit. Additional risk factors are reflected in the social history.   Sleep patterns:  Home Safety/Smoke Alarms: Feels safe in home. Smoke alarms in place.   Female:     Mammo- 03/17/18       Dexa scan-        CCS- 03/24/13. Normal per abstract entry      Objective:     Vitals: There were no vitals taken for this visit.  There is no height or weight on file to calculate BMI.  Advanced Directives 02/20/2016  Does Patient Have a Medical Advance Directive? Yes  Type of Advance Directive Living will;Healthcare Power of Attorney  Does patient want to make changes to medical advance directive? Yes - information given    Tobacco Social History   Tobacco Use  Smoking Status Never Smoker  Smokeless Tobacco Never Used     Counseling given: Not Answered   Clinical Intake:                       Past Medical History:  Diagnosis Date  . Colon polyps   . DM (diabetes mellitus) ([place])   . Fibromyalgia    "neurofibromyalgia"  . Headache   . HTN (hypertension)   . Hyperlipidemia    Past Surgical History:  Procedure Laterality Date  . WRIST SURGERY  1969   Family History  Problem Relation Age of Onset  .  Heart attack Unknown   . Hypertension Mother   . Hyperlipidemia Mother   . Stroke Mother   . Diabetes Sister   . Parkinson's disease Brother    Social History   Socioeconomic History  . Marital status: Married    Spouse name: Not on file  . Number of children: 2  . Years of education: 4  . Highest education level: Not on file  Occupational History    Comment: retired Pharmacist, hospital  Social Needs  . Financial resource strain: Not on file  . Food insecurity:    Worry: Not on file    Inability: Not on file  . Transportation needs:    Medical: Not on file    Non-medical: Not on file  Tobacco Use  . Smoking status: Never Smoker  . Smokeless tobacco: Never Used  Substance and Sexual Activity  . Alcohol use: No    Alcohol/week: 0.0 standard drinks  . Drug use: No  . Sexual activity: Never  Lifestyle  . Physical activity:    Days per week: Not on file    Minutes per session: Not on file  . Stress: Not on file  Relationships  . Social connections:    Talks on phone: Not on file    Gets together: Not on file  Attends religious service: Not on file    Active member of club or organization: Not on file    Attends meetings of clubs or organizations: Not on file    Relationship status: Not on file  Other Topics Concern  . Not on file  Social History Narrative   Lives with husband    caffeine - none    Outpatient Encounter Medications as of 11/17/2018  Medication Sig  . busPIRone (BUSPAR) 15 MG tablet Take 1 tablet (15 mg total) by mouth 2 (two) times daily.  Marland Kitchen glipiZIDE (GLUCOTROL XL) 5 MG 24 hr tablet Take 1 tablet (5 mg total) by mouth daily.  Marland Kitchen glucose blood (ACCU-CHEK AVIVA PLUS) test strip USE AS INSTRUCTED DAILY.  Marland Kitchen lisinopril (PRINIVIL,ZESTRIL) 10 MG tablet Take 1 tablet (10 mg total) by mouth daily.  . metFORMIN (GLUCOPHAGE-XR) 500 MG 24 hr tablet TAKE 2 TABLETS BY MOUTH DAILY WITH BREAKFAST AND 1 TABLET IN THE EVENING.  Marland Kitchen omeprazole (PRILOSEC) 20 MG capsule Take 1  capsule (20 mg total) by mouth daily as needed.  . pravastatin (PRAVACHOL) 20 MG tablet Take 1qd with evening meal  . sitaGLIPtin (JANUVIA) 100 MG tablet Take 1 tablet (100 mg total) by mouth daily.   No facility-administered encounter medications on file as of 11/17/2018.     Activities of Daily Living In your present state of health, do you have any difficulty performing the following activities: 07/15/2018  Hearing? N  Vision? N  Difficulty concentrating or making decisions? N  Walking or climbing stairs? N  Dressing or bathing? N  Doing errands, shopping? N  Some recent data might be hidden    Patient Care Team: Lucille Passy, MD as PCP - Jeannie Fend, MD as Consulting Physician (Ophthalmology)    Assessment:   This is a routine wellness examination for Wanda [name]. Physical assessment deferred to PCP.  Exercise Activities and Dietary recommendations   Diet (meal preparation, eat out, water intake, caffeinated beverages, dairy products, fruits and vegetables): {Desc; diets:16563} Breakfast: Lunch:  Dinner:       Goals    . Increase physical activity     Starting 02/20/16, I will continue to exercise at gym at least 60 minutes as tolerated.        Fall Risk Fall Risk  07/15/2018 09/14/2017 04/23/2016 03/19/2016 02/20/2016  Falls in the past year? 0 No No No No  Follow up Falls evaluation completed - - - -    Depression Screen PHQ 2/9 Scores 07/15/2018 09/14/2017 02/20/2016 01/25/2014  PHQ - 2 Score 2 0 0 0  PHQ- 9 Score 11 - - -     Cognitive Function Ad8 score reviewed for issues:  Issues making decisions:  Less interest in hobbies / activities:  Repeats questions, stories (family complaining):  Trouble using ordinary gadgets (microwave, computer, phone):  Forgets the month or year:   Mismanaging finances:   Remembering appts:  Daily problems with thinking and/or memory: Ad8 score is=     MMSE - Mini Mental State Exam 02/20/2016  Orientation  to time 5  Orientation to Place 5  Registration 3  Attention/ Calculation 0  Recall 3  Language- name 2 objects 0  Language- repeat 1  Language- follow 3 step command 3  Language- read & follow direction 0  Write a sentence 0  Copy design 0  Total score 20        Immunization History  Administered Date(s) Administered  . Influenza Split 06/03/2012  . Influenza,inj,Quad  PF,6+ Mos 08/10/2013, 05/03/2014, 05/06/2017, 07/15/2018  . PPD Test 04/19/2013  . Pneumococcal Conjugate-13 10/02/2014  . Pneumococcal Polysaccharide-23 04/25/2013  . Td 08/19/2007    Screening Tests Health Maintenance  Topic Date Due  . TETANUS/TDAP  01/15/2019 (Originally 08/18/2017)  . HEMOGLOBIN A1C  02/23/2019  . INFLUENZA VACCINE  03/05/2019  . MAMMOGRAM  03/18/2019  . FOOT EXAM  05/25/2019  . OPHTHALMOLOGY EXAM  05/26/2019  . Fecal DNA (Cologuard)  04/26/2021  . DEXA SCAN  Completed  . Hepatitis C Screening  Completed  . PNA vac Low Risk Adult  Completed       Plan:   ***   I have personally reviewed and noted the following in the patient's chart:   . Medical and social history . Use of alcohol, tobacco or illicit drugs  . Current medications and supplements . Functional ability and status . Nutritional status . Physical activity . Advanced directives . List of other physicians . Hospitalizations, surgeries, and ER visits in previous 12 months . Vitals . Screenings to include cognitive, depression, and falls . Referrals and appointments  In addition, I have reviewed and discussed with patient certain preventive protocols, quality metrics, and best practice recommendations. A written personalized care plan for preventive services as well as general preventive health recommendations were provided to patient.     Shela Nevin, [place]  11/17/2018

## 2018-11-24 ENCOUNTER — Encounter: Payer: Self-pay | Admitting: Family Medicine

## 2018-11-24 ENCOUNTER — Other Ambulatory Visit: Payer: Self-pay | Admitting: Nephrology

## 2018-11-24 ENCOUNTER — Ambulatory Visit: Payer: Medicare Other | Admitting: Family Medicine

## 2018-11-24 DIAGNOSIS — N183 Chronic kidney disease, stage 3 unspecified: Secondary | ICD-10-CM

## 2018-11-24 NOTE — Progress Notes (Signed)
[facility]/thx dmf    PTH: 74  Urine C&S completed  Klebsiella Pneumoniae: R-Ampicillin; I-Nitrofurantoin

## 2018-11-26 ENCOUNTER — Other Ambulatory Visit (INDEPENDENT_AMBULATORY_CARE_PROVIDER_SITE_OTHER): Payer: Medicare Other

## 2018-11-26 DIAGNOSIS — N183 Chronic kidney disease, stage 3 unspecified: Secondary | ICD-10-CM

## 2018-11-26 DIAGNOSIS — E875 Hyperkalemia: Secondary | ICD-10-CM

## 2018-11-26 DIAGNOSIS — E1121 Type 2 diabetes mellitus with diabetic nephropathy: Secondary | ICD-10-CM

## 2018-11-26 LAB — LIPID PANEL
Cholesterol: 150 mg/dL (ref 0–200)
HDL: 31.4 mg/dL — ABNORMAL LOW (ref >39.00)
NonHDL: 118.61
Total CHOL/HDL Ratio: 5
Triglycerides: 265 mg/dL — ABNORMAL HIGH (ref 0.0–149.0)
VLDL: 53 mg/dL — ABNORMAL HIGH (ref 0.0–40.0)

## 2018-11-26 LAB — COMPREHENSIVE METABOLIC PANEL
ALT: 17 U/L (ref 0–35)
AST: 16 U/L (ref 0–37)
Albumin: 4 g/dL (ref 3.5–5.2)
Alkaline Phosphatase: 80 U/L (ref 39–117)
BUN: 20 mg/dL (ref 6–23)
CO2: 26 mEq/L (ref 19–32)
Calcium: 9.3 mg/dL (ref 8.4–10.5)
Chloride: 109 mEq/L (ref 96–112)
Creatinine, Ser: 0.99 mg/dL (ref 0.40–1.20)
GFR: 55.29 mL/min — ABNORMAL LOW (ref >60.00)
Glucose, Bld: 161 mg/dL — ABNORMAL HIGH (ref 70–99)
Potassium: 5.2 mEq/L — ABNORMAL HIGH (ref 3.5–5.1)
Sodium: 143 mEq/L (ref 135–145)
Total Bilirubin: 0.5 mg/dL (ref 0.2–1.2)
Total Protein: 7.2 g/dL (ref 6.0–8.3)

## 2018-11-26 LAB — LDL CHOLESTEROL, DIRECT: Direct LDL: 71 mg/dL

## 2018-11-26 LAB — HEMOGLOBIN A1C: Hgb A1c MFr Bld: 8.2 % — ABNORMAL HIGH (ref 4.6–6.5)

## 2018-11-26 NOTE — Progress Notes (Signed)
Patient and lab staff wore face masks during visit per COVID-19 protocol.  

## 2018-11-28 NOTE — Progress Notes (Signed)
Virtual Visit via Video   Due to the COVID-19 pandemic, this visit was completed with telemedicine (audio/video) technology to reduce patient and provider exposure as well as to preserve personal protective equipment.   I connected with Wanda [name] on 11/29/18 at 10:00 AM EDT by a video enabled telemedicine application and verified that I am speaking with the correct person using two identifiers. Location patient: Home Location provider: [facility], Office Persons participating in the virtual visit: Wanda [name], Arnette Norris, MD   I discussed the limitations of evaluation and management by telemedicine and the availability of in person appointments. The patient expressed understanding and agreed to proceed.  Care Team   Patient Care Team: Lucille Passy, MD as PCP - Jeannie Fend, MD as Consulting Physician (Ophthalmology)  Subjective:   HPI:   2 month follow up.  Lab Results  Component Value Date   HGBA1C 8.2 (H) 11/26/2018    Last saw patient on 08/25/18- note reviewed.   Last saw her for on 10/21/9.  Note reviewed.  Her a1c had come down slightly to 7.8 from 8.2 in 09/2017.  Had been under a lot of stress with the death of her husband.  She admitted at January office visit that she often forgot to take the evening dose of Metformin.  She is supposed to be taking Metformin 500 mg XR- 2 tablets in the morning with breakfast and one tablet in the evening with supper.  Also she is taking Glucotrol XL daily 5 mg along with Januvia  100 mg daily.  In January, she felt there was room to improve her diet and take her medications correctly and she wanted another 3 months prior to adjusting medications.  She admits to still not taking her evening medications.     a1c was 8.3 in 1/20 and 8.2 three days ago (11/26/18).    Does not check FSBS regularly.  Lab Results  Component Value Date   HGBA1C 8.2 (H) 11/26/2018   Lab Results  Component Value Date   CHOL 150  11/26/2018   HDL 31.40 (L) 11/26/2018   LDLCALC 84 05/05/2017   LDLDIRECT 71.0 11/26/2018   TRIG 265.0 (H) 11/26/2018   CHOLHDL 5 11/26/2018   CKD- Lab Results  Component Value Date   CREATININE 0.99 11/26/2018   She is on Lisinopril for HTN and renal protection.   She is complaining of lower abdominal pain- constipation and diarrhea today.  Chart reviewed- normal colonoscopy in 03/2013.   Review of Systems  Constitutional: Negative.   HENT: Negative.   Eyes: Negative.   Respiratory: Negative.   Cardiovascular: Negative.   Gastrointestinal: Positive for abdominal pain, constipation and diarrhea.  Genitourinary: Negative.   Musculoskeletal: Negative.   Skin: Negative.   Neurological: Negative.   Endo/Heme/Allergies: Negative.   Psychiatric/Behavioral: Negative.   All other systems reviewed and are negative.    Patient Active Problem List   Diagnosis Date Noted  . GI symptoms 11/29/2018  . Grief 07/15/2018  . Pelvic prolapse 01/22/2017  . Generalized anxiety disorder 05/03/2014  . HLD (hyperlipidemia) 05/03/2014  . GERD (gastroesophageal reflux disease) 12/05/2013  . CKD (chronic kidney disease) stage 3, GFR 30-59 ml/min (HCC) 11/27/2011  . HYPERKALEMIA 11/30/2009  . INSOMNIA 11/21/2009  . FATIGUE 11/21/2009  . Diabetes mellitus with renal manifestation ([place]) 07/25/2009  . Essential hypertension 07/25/2009  . COLONIC POLYPS, HX OF 07/25/2009    Social History   Tobacco Use  . Smoking status: Never Smoker  .  Smokeless tobacco: Never Used  Substance Use Topics  . Alcohol use: No    Alcohol/week: 0.0 standard drinks    Current Outpatient Medications:  .  busPIRone (BUSPAR) 15 MG tablet, Take 1 tablet (15 mg total) by mouth 2 (two) times daily. (Patient taking differently: Take 15 mg by mouth 2 (two) times daily. Pt taking 1 PO daily), Disp: 180 tablet, Rfl: 1 .  glipiZIDE (GLUCOTROL XL) 5 MG 24 hr tablet, Take 1 tablet (5 mg total) by mouth daily., Disp: 90  tablet, Rfl: 1 .  glucose blood (ACCU-CHEK AVIVA PLUS) test strip, USE AS INSTRUCTED DAILY., Disp: 50 each, Rfl: PRN .  lisinopril (PRINIVIL,ZESTRIL) 10 MG tablet, Take 1 tablet (10 mg total) by mouth daily., Disp: 90 tablet, Rfl: 1 .  metFORMIN (GLUCOPHAGE-XR) 500 MG 24 hr tablet, TAKE 2 TABLETS BY MOUTH DAILY WITH BREAKFAST AND 1 TABLET IN THE EVENING., Disp: 180 tablet, Rfl: 1 .  omeprazole (PRILOSEC) 20 MG capsule, Take 1 capsule (20 mg total) by mouth daily as needed., Disp: 90 capsule, Rfl: 1 .  pravastatin (PRAVACHOL) 20 MG tablet, Take 1qd with evening meal, Disp: 90 tablet, Rfl: 3 .  sitaGLIPtin (JANUVIA) 100 MG tablet, Take 1 tablet (100 mg total) by mouth daily., Disp: 90 tablet, Rfl: 1  Allergies  Allergen Reactions  . Niacin     REACTION: unbearable hot flashes    Objective:  Ht 5\' 5"  (1.651 m)   BMI 24.13 kg/m   VITALS: Per patient if applicable, see vitals. GENERAL: Alert, appears well and in no acute distress. HEENT: Atraumatic, conjunctiva clear, no obvious abnormalities on inspection of external nose and ears. NECK: Normal movements of the head and neck. CARDIOPULMONARY: No increased WOB. Speaking in clear sentences. I:E ratio WNL.  MS: Moves all visible extremities without noticeable abnormality. PSYCH: Pleasant and cooperative, well-groomed. Speech normal rate and rhythm. Affect is appropriate. Insight and judgement are appropriate. Attention is focused, linear, and appropriate.  NEURO: CN grossly intact. Oriented as arrived to appointment on time with no prompting. Moves both UE equally.  SKIN: No obvious lesions, wounds, erythema, or cyanosis noted on face or hands.  Depression screen [facility] 2/9 07/15/2018 09/14/2017 02/20/2016  Decreased Interest 1 0 0  Down, Depressed, Hopeless 1 0 0  PHQ - 2 Score 2 0 0  Altered sleeping 2 - -  Tired, decreased energy 2 - -  Change in appetite 2 - -  Feeling bad or failure about yourself  1 - -  Trouble concentrating 0 - -   Moving slowly or fidgety/restless 2 - -  Suicidal thoughts 0 - -  PHQ-9 Score 11 - -  Difficult doing work/chores Very difficult - -    Assessment and Plan:   Brittnei was seen today for follow-up.  Diagnoses and all orders for this visit:  CKD (chronic kidney disease) stage 3, GFR 30-59 ml/min (HCC)  Essential hypertension  Type 2 diabetes mellitus with diabetic nephropathy, without long-term current use of insulin (HCC)  HYPERKALEMIA  GI symptoms    . COVID-19 Education:The signs and symptoms of COVID-19 were discussed with the patient and how to seek care for testing if needed. The importance of social distancing was discussed today. . Reviewed expectations re: course of current medical issues. . Discussed self-management of symptoms. . Outlined signs and symptoms indicating need for more acute intervention. . Patient verbalized understanding and all questions were answered. Marland Kitchen Health Maintenance issues including appropriate healthy diet, exercise, and smoking avoidance were  discussed with patient. . See orders for this visit as documented in the electronic medical record.  Arnette Norris, MD 11/29/2018  Records requested if needed. Time spent: 25 minutes, of which >50% was spent in obtaining information about her symptoms, reviewing her previous labs, evaluations, and treatments, counseling her about her condition (please see the discussed topics above), and developing a plan to further investigate it; she had a number of questions which I addressed.

## 2018-11-29 ENCOUNTER — Encounter: Payer: Self-pay | Admitting: Family Medicine

## 2018-11-29 ENCOUNTER — Ambulatory Visit (INDEPENDENT_AMBULATORY_CARE_PROVIDER_SITE_OTHER): Payer: Medicare Other | Admitting: Family Medicine

## 2018-11-29 VITALS — Ht 65.0 in

## 2018-11-29 DIAGNOSIS — I10 Essential (primary) hypertension: Secondary | ICD-10-CM | POA: Diagnosis not present

## 2018-11-29 DIAGNOSIS — E1121 Type 2 diabetes mellitus with diabetic nephropathy: Secondary | ICD-10-CM | POA: Diagnosis not present

## 2018-11-29 DIAGNOSIS — E785 Hyperlipidemia, unspecified: Secondary | ICD-10-CM

## 2018-11-29 DIAGNOSIS — N183 Chronic kidney disease, stage 3 unspecified: Secondary | ICD-10-CM

## 2018-11-29 DIAGNOSIS — E875 Hyperkalemia: Secondary | ICD-10-CM | POA: Diagnosis not present

## 2018-11-29 DIAGNOSIS — R198 Other specified symptoms and signs involving the digestive system and abdomen: Secondary | ICD-10-CM | POA: Insufficient documentation

## 2018-11-29 MED ORDER — METFORMIN HCL ER 500 MG PO TB24: 1000.0000 mg | ORAL_TABLET | Freq: Every day | ORAL | 1 refills | Status: DC

## 2018-11-29 MED ORDER — GLIPIZIDE ER 10 MG PO TB24: 10.0000 mg | ORAL_TABLET | Freq: Every day | ORAL | 3 refills | Status: DC

## 2018-11-29 NOTE — Assessment & Plan Note (Signed)
TG improved.

## 2018-11-29 NOTE — Assessment & Plan Note (Addendum)
>  25 minutes spent in face to face time with patient, >50% spent in counselling or coordination of care discussing diabetes, HTN, CKD.  Pt does not think she can remember to take evening medication at all.  Will change glipizide to 10 XL mg daily, Metformin 1000 mg every morning with breakfast, januvia 100 mg daily.  She will check FSBS and update me.  On ACEI and statin.

## 2018-11-29 NOTE — Assessment & Plan Note (Signed)
Renal function looks good- followed by renal as well.

## 2018-12-31 ENCOUNTER — Other Ambulatory Visit: Payer: Self-pay | Admitting: Family Medicine

## 2018-12-31 NOTE — Telephone Encounter (Signed)
Pt stated she is going to be going out of town on Monday morning.  She would like to know if this could be refilled today.  She is completely out.     [phone] number

## 2019-01-06 ENCOUNTER — Ambulatory Visit: Payer: Medicare Other

## 2019-01-14 ENCOUNTER — Ambulatory Visit
Admission: RE | Admit: 2019-01-14 | Discharge: 2019-01-14 | Disposition: A | Discharge status: Home / Self Care | Payer: Medicare Other | Source: Ambulatory Visit | Attending: Nephrology | Admitting: Nephrology

## 2019-01-14 ENCOUNTER — Other Ambulatory Visit: Payer: Self-pay

## 2019-01-14 DIAGNOSIS — N183 Chronic kidney disease, stage 3 unspecified: Secondary | ICD-10-CM

## 2019-02-21 DIAGNOSIS — N3281 Overactive bladder: Secondary | ICD-10-CM | POA: Insufficient documentation

## 2019-02-21 DIAGNOSIS — N393 Stress incontinence (female) (male): Secondary | ICD-10-CM | POA: Insufficient documentation

## 2019-02-22 ENCOUNTER — Other Ambulatory Visit: Payer: Self-pay | Admitting: Family Medicine

## 2019-03-28 HISTORY — PX: COLONOSCOPY: SHX174

## 2019-03-28 LAB — HM COLONOSCOPY

## 2019-04-04 ENCOUNTER — Encounter: Payer: Self-pay | Admitting: Family Medicine

## 2019-04-04 NOTE — Progress Notes (Signed)
[facility]/thx dmf

## 2019-04-20 ENCOUNTER — Other Ambulatory Visit: Payer: Self-pay | Admitting: Family Medicine

## 2019-05-06 ENCOUNTER — Telehealth: Payer: Self-pay

## 2019-05-06 DIAGNOSIS — E1121 Type 2 diabetes mellitus with diabetic nephropathy: Secondary | ICD-10-CM

## 2019-05-06 DIAGNOSIS — E785 Hyperlipidemia, unspecified: Secondary | ICD-10-CM

## 2019-05-06 NOTE — Telephone Encounter (Signed)
Copied from [place] [phone]. Topic: General - Inquiry >> May 06, 2019  2:10 PM Virl Axe D wrote: Reason for CRM: Pt is requesting labs to check her A1C. Has appt scheduled for 05/19/19 and would like to be able to have Dr. Deborra Medina go over results. Please advise and follow up with pt.

## 2019-05-08 NOTE — Addendum Note (Signed)
Addended by: Lucille Passy on: 05/08/2019 11:35 AM   Modules accepted: Orders

## 2019-05-08 NOTE — Telephone Encounter (Signed)
Yes okay to check a1c, lipid panel, CMET and TSH. Future orders entered.

## 2019-05-09 NOTE — Telephone Encounter (Signed)
Can we get pt set up for a lab appt? Thanks!

## 2019-05-09 NOTE — Telephone Encounter (Signed)
Patient called and scheduled her lab appt. For 05/17/19 at 3pm

## 2019-05-09 NOTE — Telephone Encounter (Signed)
Tried to call, left message °

## 2019-05-16 ENCOUNTER — Telehealth: Payer: Self-pay | Admitting: Family Medicine

## 2019-05-16 ENCOUNTER — Other Ambulatory Visit: Payer: Self-pay | Admitting: Family Medicine

## 2019-05-16 NOTE — Telephone Encounter (Signed)

## 2019-05-17 ENCOUNTER — Other Ambulatory Visit: Payer: Medicare Other

## 2019-05-17 ENCOUNTER — Other Ambulatory Visit: Payer: Self-pay

## 2019-05-17 DIAGNOSIS — E1121 Type 2 diabetes mellitus with diabetic nephropathy: Secondary | ICD-10-CM

## 2019-05-17 DIAGNOSIS — E785 Hyperlipidemia, unspecified: Secondary | ICD-10-CM

## 2019-05-17 NOTE — Progress Notes (Signed)
Questions for Screening COVID-19  Symptom onset: N/A  Travel or Contacts: NO  During this illness, did/does the patient experience any of the following symptoms? Fever >100.26F []   Yes [x]   No []   Unknown Subjective fever (felt feverish) []   Yes [x]   No []   Unknown Chills []   Yes [x]   No []   Unknown Muscle aches (myalgia) []   Yes [x]   No []   Unknown Runny nose (rhinorrhea) []   Yes [x]   No []   Unknown Sore throat []   Yes [x]   No []   Unknown Cough (new onset or worsening of chronic cough) []   Yes [x]   No []   Unknown Shortness of breath (dyspnea) []   Yes [x]   No []   Unknown Nausea or vomiting []   Yes [x]   No []   Unknown Headache []   Yes [x]   No []   Unknown Abdominal pain  []   Yes [x]   No []   Unknown Diarrhea (?3 loose/looser than normal stools/24hr period) []   Yes [x]   No []   Unknown

## 2019-05-18 ENCOUNTER — Encounter: Payer: Self-pay | Admitting: Family Medicine

## 2019-05-18 ENCOUNTER — Ambulatory Visit: Payer: Medicare Other | Admitting: Family Medicine

## 2019-05-18 VITALS — BP 148/70 | HR 88 | Temp 98.7°F | Ht 65.0 in | Wt 145.5 lb

## 2019-05-18 DIAGNOSIS — Z23 Encounter for immunization: Secondary | ICD-10-CM | POA: Diagnosis not present

## 2019-05-18 DIAGNOSIS — Z1231 Encounter for screening mammogram for malignant neoplasm of breast: Secondary | ICD-10-CM | POA: Diagnosis not present

## 2019-05-18 DIAGNOSIS — R350 Frequency of micturition: Secondary | ICD-10-CM | POA: Diagnosis not present

## 2019-05-18 DIAGNOSIS — I10 Essential (primary) hypertension: Secondary | ICD-10-CM | POA: Diagnosis not present

## 2019-05-18 DIAGNOSIS — E785 Hyperlipidemia, unspecified: Secondary | ICD-10-CM

## 2019-05-18 DIAGNOSIS — N1832 Chronic kidney disease, stage 3b: Secondary | ICD-10-CM

## 2019-05-18 DIAGNOSIS — E1121 Type 2 diabetes mellitus with diabetic nephropathy: Secondary | ICD-10-CM | POA: Diagnosis not present

## 2019-05-18 LAB — URINALYSIS, ROUTINE W REFLEX MICROSCOPIC
Bilirubin Urine: NEGATIVE
Hgb urine dipstick: NEGATIVE
Ketones, ur: NEGATIVE
Leukocytes,Ua: NEGATIVE
Nitrite: NEGATIVE
RBC / HPF: NONE SEEN (ref 0–?)
Specific Gravity, Urine: 1.025 (ref 1.000–1.030)
Total Protein, Urine: NEGATIVE
Urine Glucose: >=1000 — AB
Urobilinogen, UA: 0.2 (ref 0.0–1.0)
pH: 5 (ref 5.0–8.0)

## 2019-05-18 LAB — COMPREHENSIVE METABOLIC PANEL
ALT: 31 U/L (ref 0–35)
AST: 30 U/L (ref 0–37)
Albumin: 4.5 g/dL (ref 3.5–5.2)
Alkaline Phosphatase: 86 U/L (ref 39–117)
BUN: 18 mg/dL (ref 6–23)
CO2: 23 mEq/L (ref 19–32)
Calcium: 9.8 mg/dL (ref 8.4–10.5)
Chloride: 106 mEq/L (ref 96–112)
Creatinine, Ser: 1 mg/dL (ref 0.40–1.20)
GFR: 54.57 mL/min — ABNORMAL LOW (ref >60.00)
Glucose, Bld: 101 mg/dL — ABNORMAL HIGH (ref 70–99)
Potassium: 4.1 mEq/L (ref 3.5–5.1)
Sodium: 140 mEq/L (ref 135–145)
Total Bilirubin: 0.5 mg/dL (ref 0.2–1.2)
Total Protein: 8 g/dL (ref 6.0–8.3)

## 2019-05-18 LAB — HEMOGLOBIN A1C: Hgb A1c MFr Bld: 8.2 % — ABNORMAL HIGH (ref 4.6–6.5)

## 2019-05-18 LAB — LIPID PANEL
Cholesterol: 161 mg/dL (ref 0–200)
HDL: 38.3 mg/dL — ABNORMAL LOW (ref >39.00)
NonHDL: 122.79
Total CHOL/HDL Ratio: 4
Triglycerides: 229 mg/dL — ABNORMAL HIGH (ref 0.0–149.0)
VLDL: 45.8 mg/dL — ABNORMAL HIGH (ref 0.0–40.0)

## 2019-05-18 LAB — LDL CHOLESTEROL, DIRECT: Direct LDL: 81 mg/dL

## 2019-05-18 LAB — TSH: TSH: 4.36 u[IU]/mL (ref 0.35–4.50)

## 2019-05-18 NOTE — Assessment & Plan Note (Signed)
Renal labs today.

## 2019-05-18 NOTE — Assessment & Plan Note (Signed)
UA and urine cx done today.

## 2019-05-18 NOTE — Assessment & Plan Note (Signed)
On pravachol. Due for labs today.

## 2019-05-18 NOTE — Addendum Note (Signed)
Addended by: Lynnea Ferrier on: 05/18/2019 01:32 PM   Modules accepted: Orders

## 2019-05-18 NOTE — Progress Notes (Signed)
Subjective:   Patient ID: Wanda [name], female    DOB: 09/20/1945, 73 y.o.   MRN: [id number]  Wanda [name] is a pleasant 73 y.o. year old female who presents to clinic today with Follow-up (Pt is here today to F/U with DM. On 4.24.20 her A1C was 8.2. She is due for a flu shot.) and Urinary Tract Infection (Light discharge.)  on 05/18/2019  HPI:  DM-  Last saw her for on 10/21/9.  Note reviewed.  Her a1c had come down slightly to 7.8 from 8.2 in 09/2017.   Denies any episodes of hypoglycemia. She is current taking Metformin 1000 mg every morning and 500 mg every evening along with Januvia 100 mg daily and glipizide XL 10 mg daily. Lab Results  Component Value Date   HGBA1C 8.2 (H) 11/26/2018   HLD-  Compliant with Pravachol 20 mg daily.  Lab Results  Component Value Date   CHOL 150 11/26/2018   HDL 31.40 (L) 11/26/2018   LDLCALC 84 05/05/2017   LDLDIRECT 71.0 11/26/2018   TRIG 265.0 (H) 11/26/2018   CHOLHDL 5 11/26/2018   Lab Results  Component Value Date   ALT 17 11/26/2018   AST 16 11/26/2018   ALKPHOS 80 11/26/2018   BILITOT 0.5 11/26/2018   ?UTI- having increased urinary frequency with intermittent dysuria. Current Outpatient Medications on File Prior to Visit  Medication Sig Dispense Refill  . busPIRone (BUSPAR) 15 MG tablet Take 1 tablet (15 mg total) by mouth 2 (two) times daily. (Patient taking differently: Take 15 mg by mouth 2 (two) times daily. Pt taking 1 PO daily) 180 tablet 1  . glipiZIDE (GLUCOTROL XL) 10 MG 24 hr tablet TAKE 1 TABLET BY MOUTH DAILY WITH BREAKFAST. 30 tablet 3  . glucose blood (ACCU-CHEK AVIVA PLUS) test strip USE AS INSTRUCTED DAILY. 50 each PRN  . JANUVIA 100 MG tablet TAKE 1 TABLET BY MOUTH DAILY. 90 tablet 1  . lisinopril (ZESTRIL) 10 MG tablet TAKE 1 TABLET BY MOUTH DAILY. 90 tablet 2  . metFORMIN (GLUCOPHAGE-XR) 500 MG 24 hr tablet TAKE 2 TABLETS BY MOUTH DAILY WITH BREAKFAST AND 1 TABLET IN THE EVENING. 180 tablet 1  . omeprazole  (PRILOSEC) 20 MG capsule Take 1 capsule (20 mg total) by mouth daily as needed. 90 capsule 1  . pravastatin (PRAVACHOL) 20 MG tablet Take 1qd with evening meal 90 tablet 3   No current facility-administered medications on file prior to visit.     Allergies  Allergen Reactions  . Niacin     REACTION: unbearable hot flashes    Past Medical History:  Diagnosis Date  . Colon polyps   . DM (diabetes mellitus) ([place])   . Fibromyalgia    "neurofibromyalgia"  . Headache   . HTN (hypertension)   . Hyperlipidemia     Past Surgical History:  Procedure Laterality Date  . WRIST SURGERY  1969    Family History  Problem Relation Age of Onset  . Heart attack Unknown   . Hypertension Mother   . Hyperlipidemia Mother   . Stroke Mother   . Diabetes Sister   . Parkinson's disease Brother     Social History   Socioeconomic History  . Marital status: Widowed    Spouse name: Not on file  . Number of children: 2  . Years of education: 70  . Highest education level: Not on file  Occupational History    Comment: retired Pharmacist, hospital  Social Needs  . Financial resource strain: Not  on file  . Food insecurity    Worry: Not on file    Inability: Not on file  . Transportation needs    Medical: Not on file    Non-medical: Not on file  Tobacco Use  . Smoking status: Never Smoker  . Smokeless tobacco: Never Used  Substance and Sexual Activity  . Alcohol use: No    Alcohol/week: 0.0 standard drinks  . Drug use: No  . Sexual activity: Never  Lifestyle  . Physical activity    Days per week: Not on file    Minutes per session: Not on file  . Stress: Not on file  Relationships  . Social Herbalist on phone: Not on file    Gets together: Not on file    Attends religious service: Not on file    Active member of club or organization: Not on file    Attends meetings of clubs or organizations: Not on file    Relationship status: Not on file  . Intimate partner violence    Fear  of current or ex partner: Not on file    Emotionally abused: Not on file    Physically abused: Not on file    Forced sexual activity: Not on file  Other Topics Concern  . Not on file  Social History Narrative   Lives with husband    caffeine - none   The PMH, PSH, Social History, Family History, Medications, and allergies have been reviewed in [facility], and have been updated if relevant.    Review of Systems  Constitutional: Negative.   HENT: Negative.   Eyes: Negative.   Respiratory: Negative.   Cardiovascular: Negative.   Gastrointestinal: Negative.   Endocrine: Negative.   Genitourinary: Positive for frequency. Negative for dysuria.  Musculoskeletal: Negative.   Allergic/Immunologic: Negative.   Neurological: Negative.   Hematological: Negative.   Psychiatric/Behavioral: Negative.   All other systems reviewed and are negative.      Objective:    BP (!) 148/70 (BP Location: Left Arm, Patient Position: Sitting, Cuff Size: Normal)   Pulse 88   Temp 98.7 F (37.1 C) (Oral)   Ht 5\' 5"  (1.651 m)   Wt 145 lb 8 oz (66 kg)   SpO2 98%   BMI 24.21 kg/m   Wt Readings from Last 3 Encounters:  05/18/19 145 lb 8 oz (66 kg)  08/25/18 145 lb (65.8 kg)  07/15/18 144 lb 9.6 oz (65.6 kg)    Physical Exam Vitals signs and nursing note reviewed.  Constitutional:      Appearance: Normal appearance.  HENT:     Head: Atraumatic.     Mouth/Throat:     Mouth: Mucous membranes are moist.  Eyes:     Extraocular Movements: Extraocular movements intact.  Neck:     Musculoskeletal: Normal range of motion.  Cardiovascular:     Rate and Rhythm: Normal rate and regular rhythm.  Pulmonary:     Effort: Pulmonary effort is normal.     Breath sounds: Normal breath sounds.  Abdominal:     General: Abdomen is flat. Bowel sounds are normal.     Palpations: Abdomen is soft.     Tenderness: There is abdominal tenderness in the suprapubic area. There is no left CVA tenderness, guarding or  rebound.  Musculoskeletal: Normal range of motion.  Skin:    General: Skin is warm and dry.  Neurological:     General: No focal deficit present.     Mental  Status: She is alert.  Psychiatric:        Mood and Affect: Mood normal.        Behavior: Behavior normal.        Thought Content: Thought content normal.        Judgment: Judgment normal.         Assessment & Plan:   Encounter for screening mammogram for malignant neoplasm of breast - Plan: MM Digital Screening  Urinary frequency - Plan: Urinalysis, Routine w reflex microscopic, Urine Culture  Need for immunization against influenza - Plan: Flu Vaccine QUAD 36+ mos IM  Essential hypertension  Type 2 diabetes mellitus with diabetic nephropathy, without long-term current use of insulin (HCC)  Hyperlipidemia, unspecified hyperlipidemia type No follow-ups on file.

## 2019-05-18 NOTE — Patient Instructions (Addendum)
Great to see you. I will call you with your lab results from today and you can view them online.   Please call [facility] at [phone] to schedule your mammogram.

## 2019-05-18 NOTE — Assessment & Plan Note (Signed)
Well controlled on lisinopril 10 mg daily. Check labs today.

## 2019-05-18 NOTE — Assessment & Plan Note (Signed)
Continue continue medications, check a1c  And additional labs today.

## 2019-05-19 ENCOUNTER — Ambulatory Visit: Payer: Medicare Other | Admitting: Family Medicine

## 2019-05-19 ENCOUNTER — Encounter: Payer: Self-pay | Admitting: Family Medicine

## 2019-05-20 LAB — URINE CULTURE
MICRO NUMBER:: 989080
SPECIMEN QUALITY:: ADEQUATE

## 2019-05-25 ENCOUNTER — Other Ambulatory Visit: Payer: Medicare Other

## 2019-05-25 ENCOUNTER — Other Ambulatory Visit: Payer: Self-pay

## 2019-06-10 LAB — HM MAMMOGRAPHY

## 2019-06-16 ENCOUNTER — Encounter: Payer: Self-pay | Admitting: Family Medicine

## 2019-06-16 ENCOUNTER — Telehealth (INDEPENDENT_AMBULATORY_CARE_PROVIDER_SITE_OTHER): Payer: Medicare Other | Admitting: Family Medicine

## 2019-06-16 ENCOUNTER — Other Ambulatory Visit: Payer: Self-pay

## 2019-06-16 VITALS — Temp 99.1°F

## 2019-06-16 DIAGNOSIS — Z20822 Contact with and (suspected) exposure to covid-19: Secondary | ICD-10-CM

## 2019-06-16 DIAGNOSIS — E1121 Type 2 diabetes mellitus with diabetic nephropathy: Secondary | ICD-10-CM

## 2019-06-16 DIAGNOSIS — Z20828 Contact with and (suspected) exposure to other viral communicable diseases: Secondary | ICD-10-CM | POA: Diagnosis not present

## 2019-06-16 NOTE — Progress Notes (Signed)
TELEPHONE ENCOUNTER   Patient verbally agreed to telephone visit and is aware that copayment and coinsurance may apply. Patient was treated using telemedicine according to accepted telemedicine protocols.  Location of the patient: patient's home Location of provider: patient's home Names of all persons participating in the telemedicine service and role in the encounter: Arnette Norris, MD Berna Bue  Subjective:   No chief complaint on file.   HPI   Acute onset of the following symptoms last night-  Starting last night- low great headache, coughing spells, she is not yet sure if she has had a loss of taste or smell.  Has had some diarrhea which is unusual for her.   No known sick contacts- no one has told her if they have COVID 19.  No increased work of breathing but she feels cough is already worsening since last night.    Patient Active Problem List   Diagnosis Date Noted  . Urinary frequency 05/18/2019  . Need for immunization against influenza 05/18/2019  . GI symptoms 11/29/2018  . Grief 07/15/2018  . Pelvic prolapse 01/22/2017  . Generalized anxiety disorder 05/03/2014  . HLD (hyperlipidemia) 05/03/2014  . GERD (gastroesophageal reflux disease) 12/05/2013  . CKD (chronic kidney disease) stage 3, GFR 30-59 ml/min 11/27/2011  . HYPERKALEMIA 11/30/2009  . INSOMNIA 11/21/2009  . FATIGUE 11/21/2009  . Diabetes mellitus with renal manifestation ([place]) 07/25/2009  . Essential hypertension 07/25/2009  . COLONIC POLYPS, HX OF 07/25/2009   Social History   Tobacco Use  . Smoking status: Never Smoker  . Smokeless tobacco: Never Used  Substance Use Topics  . Alcohol use: No    Alcohol/week: 0.0 standard drinks    Current Outpatient Medications:  .  busPIRone (BUSPAR) 15 MG tablet, Take 1 tablet (15 mg total) by mouth 2 (two) times daily. (Patient taking differently: Take 15 mg by mouth 2 (two) times daily. Pt taking 1 PO daily), Disp: 180 tablet, Rfl: 1 .  glipiZIDE  (GLUCOTROL XL) 10 MG 24 hr tablet, TAKE 1 TABLET BY MOUTH DAILY WITH BREAKFAST., Disp: 30 tablet, Rfl: 3 .  glucose blood (ACCU-CHEK AVIVA PLUS) test strip, USE AS INSTRUCTED DAILY., Disp: 50 each, Rfl: PRN .  JANUVIA 100 MG tablet, TAKE 1 TABLET BY MOUTH DAILY., Disp: 90 tablet, Rfl: 1 .  lisinopril (ZESTRIL) 10 MG tablet, TAKE 1 TABLET BY MOUTH DAILY., Disp: 90 tablet, Rfl: 2 .  metFORMIN (GLUCOPHAGE-XR) 500 MG 24 hr tablet, TAKE 2 TABLETS BY MOUTH DAILY WITH BREAKFAST AND 1 TABLET IN THE EVENING., Disp: 180 tablet, Rfl: 1 .  omeprazole (PRILOSEC) 20 MG capsule, Take 1 capsule (20 mg total) by mouth daily as needed., Disp: 90 capsule, Rfl: 1 .  pravastatin (PRAVACHOL) 20 MG tablet, Take 1qd with evening meal, Disp: 90 tablet, Rfl: 3 Allergies  Allergen Reactions  . Niacin     REACTION: unbearable hot flashes    Assessment & Plan:   No diagnosis found.  No orders of the defined types were placed in this encounter.  No orders of the defined types were placed in this encounter.   Arnette Norris, MD 06/16/2019  Time spent with the patient: 21 minutes, spent in obtaining information about her symptoms, reviewing her previous labs, evaluations, and treatments, counseling her about her condition (please see the discussed topics above), and developing a plan to further investigate it; she had a number of questions which I addressed.   [id number] physician/qualified health professional telephone evaluation 5 to 10 minutes [id number] physician/qualified  help functional [place] evaluation for 11 to 20 minutes [id number] physician/qualify he will professional telephone evaluation for 21 to 30 minutes

## 2019-06-16 NOTE — Assessment & Plan Note (Signed)
New, abrupt onset of symptoms concerning for COVID 80- discussed importance of calling 911 if she develops any difficulty breathing.  Also ordered home temp and monitoring system for her along with covid test with location of where she should go- sent all instructions via mychart.  The patient indicates understanding of these issues and agrees with the plan. She will keep me updated.  Orders Placed This Encounter  Procedures  . Novel Coronavirus, NAA ([facility])

## 2019-06-17 ENCOUNTER — Telehealth: Payer: Self-pay

## 2019-06-17 NOTE — Telephone Encounter (Signed)
Ret'd call to pt., in response to MyChart message of fever, cough, headache and body aches.  Pt. Reported she is awaiting her COVID results.  Reported she has been taking Tylenol for fever and body aches.  Stated she feels better today compared to yesterday, but still is not feeling 100%.  Advised on importance of rest and hydration.  Discussed use of humidifier at the bedside, to soothe nasal passages and throat, and help to thin the secretions.  Advised to stay quarantined until COVID results are finalized.  Advised on safe practice guidelines.  Encouraged to check with pharmacist re: OTC cough medications that are safe to take with her current BP medication.  Pt. Verb. Understanding.

## 2019-06-18 LAB — NOVEL CORONAVIRUS, NAA: SARS-CoV-2, NAA: DETECTED — AB

## 2019-06-20 ENCOUNTER — Telehealth: Payer: Self-pay | Admitting: *Deleted

## 2019-06-20 NOTE — Telephone Encounter (Signed)
See Result note 

## 2019-08-09 ENCOUNTER — Telehealth: Payer: Self-pay | Admitting: Family Medicine

## 2019-08-09 DIAGNOSIS — E785 Hyperlipidemia, unspecified: Secondary | ICD-10-CM

## 2019-08-09 DIAGNOSIS — E1121 Type 2 diabetes mellitus with diabetic nephropathy: Secondary | ICD-10-CM

## 2019-08-09 NOTE — Telephone Encounter (Signed)
Patient is coming in for a DM follow up on 08/22/2019. Pt is asking if she could get labs done before her appt so Dr. Deborra Medina could go over them with her. Please advise.

## 2019-08-10 ENCOUNTER — Ambulatory Visit: Payer: Medicare PPO | Admitting: Family Medicine

## 2019-08-10 NOTE — Telephone Encounter (Signed)
Yes of course.  Orders entered.  Please schedule lab visit.

## 2019-08-10 NOTE — Telephone Encounter (Signed)
I returned pt's and LDM to call office back to schedule lab appointment.  Labs are in.

## 2019-08-21 NOTE — Assessment & Plan Note (Signed)
History: Review: taking medications as instructed, no medication side effects noted, no TIAs, no chest pain on exertion, no dyspnea on exertion, no swelling of ankles. Smoker: No.  BP Readings from Last 3 Encounters:  05/18/19 (!) 148/70  08/25/18 116/62  07/15/18 140/84   Lab Results  Component Value Date   CREATININE 1.00 05/18/2019     Assessment/Plan: 1. Medication: no change. 2. Dietary sodium restriction. 3. Regular aerobic exercise.

## 2019-08-21 NOTE — Assessment & Plan Note (Addendum)
Currently taking Glucotrol XL 10 mg 1 tab by mouth daily with breakfast, Metformin XR 2 tabs by mouth with breakfast and 1 tab by mouth with supper, and Januvia 100 mg daily. Medication compliance: compliant most of the time, diabetic diet compliance: compliant most of the time, home glucose monitoring: , further diabetic ROS: no polyuria or polydipsia, no chest pain, dyspnea or TIA's, no numbness.  She is having ? Circulation questions as she is having more neuropathy in her feet..  Circulation seems fine - pain in legs is not worsened by exertion and relieved by rest.  Unlikely PAD.  On statin and ACEI.   Lab Results  Component Value Date   HGBA1C 8.2 (H) 05/18/2019   HGBA1C 8.2 (H) 11/26/2018   HGBA1C 8.3 (H) 08/25/2018   Lab Results  Component Value Date   MICROALBUR 1.3 10/02/2014   LDLCALC 84 05/05/2017   CREATININE 1.00 05/18/2019    Health Maintenance 1.  Patient is counseled on appropriate foot care. 2.  BP goal < 130/80.  On ACEI 3.  LDL goal of < 100, HDL > 40 and TG < 150. All diabetic should be on a statin unless contraindication.  She is taking pravachol 20 mg daily. Lab Results  Component Value Date   CHOL 161 05/18/2019   HDL 38.30 (L) 05/18/2019   LDLCALC 84 05/05/2017   LDLDIRECT 81.0 05/18/2019   TRIG 229.0 (H) 05/18/2019   CHOLHDL 4 05/18/2019    4.  Eye Exam yearly and Dental Exam every 6 months. 5.  Dietary recommendations: < 100 g carbohydrates daily. 6.  Physical Activity recommendations: As tolerated aerobic and strength exercises.  7.  Appropriate vaccines reviewed.

## 2019-08-21 NOTE — Assessment & Plan Note (Signed)
Lab results and trends reviewed. We discussed several lifestyle modifications today and she will continue to work on diet, exercise and weight loss efforts. Avoid nephrotoxic medications. Orders and follow up as documented in patient record.   Lab Results  Component Value Date   CREATININE 1.00 05/18/2019   BUN 18 05/18/2019   NA 140 05/18/2019   K 4.1 05/18/2019   CL 106 05/18/2019   CO2 23 05/18/2019   CrCl cannot be calculated (Patient's most recent lab result is older than the maximum 21 days allowed.).  Counseling . Chronic kidney disease (CKD) happens when the kidneys are damaged over a long period of time. . Most of the time, this condition does not go away, but it can usually be controlled. Steps must be taken to slow down the kidney damage or to stop it from getting worse. . Intensive lifestyle modifications are the first line treatment for this issue.  Marland Kitchen Avoid buying foods that are: processed, frozen, or prepackaged to avoid excess salt.

## 2019-08-21 NOTE — Patient Instructions (Addendum)
Great to see you. Thank you for letting me care for you and your family.   I will miss you all so much!  I will call you with your lab results from today and you can view them online.     COVID-19 Vaccine Information can be found at: [email or web address] For questions related to vaccine distribution or appointments, please email [email or web address] or call [phone].   We discussed trying gabapentin for diabetic neuropathy today.

## 2019-08-21 NOTE — Progress Notes (Signed)
Subjective:    Patient ID: Wanda [name], female    DOB: [birth year], 74 y.o.   MRN: [id number]  Chief Complaint  Patient presents with  . Diabetes    needs foot exam  . ?circulation issues    Pt c/o numbness in both feet anf legs   . Hyperlipidemia  . Hypertension  . Chronic Kidney Disease    HPI Patient is in today for follow up and ? Circulation issues. Last A1C on 10.14.20 remained at 8.2. She is taking Glipizide, Metformin and Januvia. She is C/O possible circulatory issues in legs and feet bilaterally.   Depression screen [facility] 2/9 08/22/2019 07/15/2018 09/14/2017 02/20/2016 01/25/2014  Decreased Interest 0 1 0 0 0  Down, Depressed, Hopeless 0 1 0 0 0  PHQ - 2 Score 0 2 0 0 0  Altered sleeping - 2 - - -  Tired, decreased energy - 2 - - -  Change in appetite - 2 - - -  Feeling bad or failure about yourself  - 1 - - -  Trouble concentrating - 0 - - -  Moving slowly or fidgety/restless - 2 - - -  Suicidal thoughts - 0 - - -  PHQ-9 Score - 11 - - -  Difficult doing work/chores - Very difficult - - -    Past Medical History:  Diagnosis Date  . Colon polyps   . DM (diabetes mellitus) ([place])   . Fibromyalgia    "neurofibromyalgia"  . Headache   . HTN (hypertension)   . Hyperlipidemia     Past Surgical History:  Procedure Laterality Date  . WRIST SURGERY  1969    Family History  Problem Relation Age of Onset  . Heart attack Unknown   . Hypertension Mother   . Hyperlipidemia Mother   . Stroke Mother   . Diabetes Sister   . Parkinson's disease Brother     Social History   Socioeconomic History  . Marital status: Widowed    Spouse name: Not on file  . Number of children: 2  . Years of education: 51  . Highest education level: Not on file  Occupational History    Comment: retired Pharmacist, hospital  Tobacco Use  . Smoking status: Never Smoker  . Smokeless tobacco: Never Used  Substance and Sexual Activity  . Alcohol use: No    Alcohol/week: 0.0 standard drinks  .  Drug use: No  . Sexual activity: Never  Other Topics Concern  . Not on file  Social History Narrative   Lives with husband    caffeine - none   Social Determinants of Health   Financial Resource Strain:   . Difficulty of Paying Living Expenses: Not on file  Food Insecurity:   . Worried About Charity fundraiser in the Last Year: Not on file  . Ran Out of Food in the Last Year: Not on file  Transportation Needs:   . Lack of Transportation (Medical): Not on file  . Lack of Transportation (Non-Medical): Not on file  Physical Activity:   . Days of Exercise per Week: Not on file  . Minutes of Exercise per Session: Not on file  Stress:   . Feeling of Stress : Not on file  Social Connections:   . Frequency of Communication with Friends and Family: Not on file  . Frequency of Social Gatherings with Friends and Family: Not on file  . Attends Religious Services: Not on file  . Active Member of Clubs or Organizations:  Not on file  . Attends Archivist Meetings: Not on file  . Marital Status: Not on file  Intimate Partner Violence:   . Fear of Current or Ex-Partner: Not on file  . Emotionally Abused: Not on file  . Physically Abused: Not on file  . Sexually Abused: Not on file    Outpatient Medications Prior to Visit  Medication Sig Dispense Refill  . busPIRone (BUSPAR) 15 MG tablet Take 1 tablet (15 mg total) by mouth 2 (two) times daily. (Patient taking differently: Take 15 mg by mouth 2 (two) times daily. Pt taking 1 PO daily) 180 tablet 1  . glipiZIDE (GLUCOTROL XL) 10 MG 24 hr tablet TAKE 1 TABLET BY MOUTH DAILY WITH BREAKFAST. 30 tablet 3  . glucose blood (ACCU-CHEK AVIVA PLUS) test strip USE AS INSTRUCTED DAILY. 50 each PRN  . JANUVIA 100 MG tablet TAKE 1 TABLET BY MOUTH DAILY. 90 tablet 1  . lisinopril (ZESTRIL) 10 MG tablet TAKE 1 TABLET BY MOUTH DAILY. 90 tablet 2  . metFORMIN (GLUCOPHAGE-XR) 500 MG 24 hr tablet TAKE 2 TABLETS BY MOUTH DAILY WITH BREAKFAST AND 1  TABLET IN THE EVENING. 180 tablet 1  . omeprazole (PRILOSEC) 20 MG capsule Take 1 capsule (20 mg total) by mouth daily as needed. 90 capsule 1  . pravastatin (PRAVACHOL) 20 MG tablet Take 1qd with evening meal 90 tablet 3   No facility-administered medications prior to visit.    Allergies  Allergen Reactions  . Niacin     REACTION: unbearable hot flashes    Review of Systems  Constitutional: Negative for fever and malaise/fatigue.  HENT: Negative for congestion and hearing loss.   Eyes: Negative for blurred vision, discharge and redness.  Respiratory: Negative for cough and shortness of breath.   Cardiovascular: Negative for chest pain, palpitations and leg swelling.  Gastrointestinal: Negative for abdominal pain and heartburn.  Genitourinary: Negative for dysuria.  Musculoskeletal: Negative for falls.       +leg cramping at night  Skin: Negative for rash.  Neurological: Positive for tingling. Negative for tremors, sensory change, speech change, focal weakness, seizures, loss of consciousness, weakness and headaches.  Endo/Heme/Allergies: Does not bruise/bleed easily.  Psychiatric/Behavioral: Negative for depression.  All other systems reviewed and are negative.      Objective:    Physical Exam Vitals and nursing note reviewed.  Constitutional:      Appearance: Normal appearance. She is not ill-appearing.  HENT:     Head: Normocephalic and atraumatic.     Right Ear: External ear normal.     Left Ear: External ear normal.     Nose: Nose normal.  Eyes:     General:        Right eye: No discharge.        Left eye: No discharge.  Cardiovascular:     Rate and Rhythm: Normal rate and regular rhythm.     Heart sounds: Normal heart sounds.  Pulmonary:     Effort: Pulmonary effort is normal.     Breath sounds: No wheezing.  Abdominal:     Palpations: Abdomen is soft. There is no mass.     Tenderness: There is no abdominal tenderness. There is no guarding.   Musculoskeletal:        General: Normal range of motion.     Right lower leg: No edema.     Left lower leg: No edema.  Skin:    General: Skin is dry.  Neurological:  Mental Status: She is alert and oriented to person, place, and time.     Deep Tendon Reflexes: Reflexes normal.  Psychiatric:        Mood and Affect: Mood normal.        Behavior: Behavior normal.     BP 106/64 (BP Location: Left Arm, Patient Position: Sitting, Cuff Size: Normal)   Pulse 64   Temp 98.4 F (36.9 C) (Oral)   SpO2 98%  Wt Readings from Last 3 Encounters:  05/18/19 145 lb 8 oz (66 kg)  08/25/18 145 lb (65.8 kg)  07/15/18 144 lb 9.6 oz (65.6 kg)     Lab Results  Component Value Date   WBC 6.7 10/25/2018   HGB 12.0 10/25/2018   HCT 36 10/25/2018   PLT 368 10/25/2018   GLUCOSE 101 (H) 05/18/2019   CHOL 161 05/18/2019   TRIG 229.0 (H) 05/18/2019   HDL 38.30 (L) 05/18/2019   LDLDIRECT 81.0 05/18/2019   LDLCALC 84 05/05/2017   ALT 31 05/18/2019   AST 30 05/18/2019   NA 140 05/18/2019   K 4.1 05/18/2019   CL 106 05/18/2019   CREATININE 1.00 05/18/2019   BUN 18 05/18/2019   CO2 23 05/18/2019   TSH 4.36 05/18/2019   HGBA1C 8.2 (H) 05/18/2019   MICROALBUR 1.3 10/02/2014    Lab Results  Component Value Date   TSH 4.36 05/18/2019   Lab Results  Component Value Date   WBC 6.7 10/25/2018   HGB 12.0 10/25/2018   HCT 36 10/25/2018   MCV 93.5 08/25/2018   PLT 368 10/25/2018   Lab Results  Component Value Date   NA 140 05/18/2019   K 4.1 05/18/2019   CO2 23 05/18/2019   GLUCOSE 101 (H) 05/18/2019   BUN 18 05/18/2019   CREATININE 1.00 05/18/2019   BILITOT 0.5 05/18/2019   ALKPHOS 86 05/18/2019   AST 30 05/18/2019   ALT 31 05/18/2019   PROT 8.0 05/18/2019   ALBUMIN 4.5 05/18/2019   CALCIUM 9.8 05/18/2019   ANIONGAP 14 06/23/2014   GFR 54.57 (L) 05/18/2019   Lab Results  Component Value Date   CHOL 161 05/18/2019   Lab Results  Component Value Date   HDL 38.30 (L)  05/18/2019   Lab Results  Component Value Date   LDLCALC 84 05/05/2017   Lab Results  Component Value Date   TRIG 229.0 (H) 05/18/2019   Lab Results  Component Value Date   CHOLHDL 4 05/18/2019   Lab Results  Component Value Date   HGBA1C 8.2 (H) 05/18/2019       Assessment & Plan:   Problem List Items Addressed This Visit      Active Problems   Diabetes mellitus with renal manifestation (HCC) - Primary    Currently taking Glucotrol XL 10 mg 1 tab by mouth daily with breakfast, Metformin XR 2 tabs by mouth with breakfast and 1 tab by mouth with supper, and Januvia 100 mg daily. Medication compliance: compliant most of the time, diabetic diet compliance: compliant most of the time, home glucose monitoring: , further diabetic ROS: no polyuria or polydipsia, no chest pain, dyspnea or TIA's, no numbness.  She is having ? Circulation questions as she is having more neuropathy in her feet..  Circulation seems fine - pain in legs is not worsened by exertion and relieved by rest.  Unlikely PAD.  On statin and ACEI.   Lab Results  Component Value Date   HGBA1C 8.2 (H) 05/18/2019  HGBA1C 8.2 (H) 11/26/2018   HGBA1C 8.3 (H) 08/25/2018   Lab Results  Component Value Date   MICROALBUR 1.3 10/02/2014   LDLCALC 84 05/05/2017   CREATININE 1.00 05/18/2019    Health Maintenance 1.  Patient is counseled on appropriate foot care. 2.  BP goal < 130/80.  On ACEI 3.  LDL goal of < 100, HDL > 40 and TG < 150. All diabetic should be on a statin unless contraindication.  She is taking pravachol 20 mg daily. Lab Results  Component Value Date   CHOL 161 05/18/2019   HDL 38.30 (L) 05/18/2019   LDLCALC 84 05/05/2017   LDLDIRECT 81.0 05/18/2019   TRIG 229.0 (H) 05/18/2019   CHOLHDL 4 05/18/2019    4.  Eye Exam yearly and Dental Exam every 6 months. 5.  Dietary recommendations: < 100 g carbohydrates daily. 6.  Physical Activity recommendations: As tolerated aerobic and strength  exercises.  7.  Appropriate vaccines reviewed.         Relevant Orders   DM- a1c   Lipid panel   TSH   Essential hypertension    History: Review: taking medications as instructed, no medication side effects noted, no TIAs, no chest pain on exertion, no dyspnea on exertion, no swelling of ankles. Smoker: No.  BP Readings from Last 3 Encounters:  05/18/19 (!) 148/70  08/25/18 116/62  07/15/18 140/84   Lab Results  Component Value Date   CREATININE 1.00 05/18/2019     Assessment/Plan: 1. Medication: no change. 2. Dietary sodium restriction. 3. Regular aerobic exercise.        CKD (chronic kidney disease) stage 3, GFR 30-59 ml/min    Lab results and trends reviewed. We discussed several lifestyle modifications today and she will continue to work on diet, exercise and weight loss efforts. Avoid nephrotoxic medications. Orders and follow up as documented in patient record.   Lab Results  Component Value Date   CREATININE 1.00 05/18/2019   BUN 18 05/18/2019   NA 140 05/18/2019   K 4.1 05/18/2019   CL 106 05/18/2019   CO2 23 05/18/2019   CrCl cannot be calculated (Patient's most recent lab result is older than the maximum 21 days allowed.).  Counseling . Chronic kidney disease (CKD) happens when the kidneys are damaged over a long period of time. . Most of the time, this condition does not go away, but it can usually be controlled. Steps must be taken to slow down the kidney damage or to stop it from getting worse. . Intensive lifestyle modifications are the first line treatment for this issue.  Marland Kitchen Avoid buying foods that are: processed, frozen, or prepackaged to avoid excess salt.        Relevant Orders   Vitamin D (25 hydroxy)   Renal Function Panel   Adjustment disorder with depressed mood    History: Doing well.  Depression screen [facility] 2/9 08/22/2019 07/15/2018 09/14/2017 02/20/2016 01/25/2014  Decreased Interest 0 1 0 0 0  Down, Depressed, Hopeless 0 1 0 0 0   PHQ - 2 Score 0 2 0 0 0  Altered sleeping - 2 - - -  Tired, decreased energy - 2 - - -  Change in appetite - 2 - - -  Feeling bad or failure about yourself  - 1 - - -  Trouble concentrating - 0 - - -  Moving slowly or fidgety/restless - 2 - - -  Suicidal thoughts - 0 - - -  PHQ-9 Score -  11 - - -  Difficult doing work/chores - Very difficult - - -         HLD (hyperlipidemia)    Well controlled on current regimen. LDL is at goal on current statin dose, patient has no side effects from medication and LFTS are normal. No changes today.       Relevant Orders   Lipid panel   Comprehensive metabolic panel   CBC with Differential/Platelet   TSH   Disease related peripheral neuropathy    Diabetic Foot Exam - Simple   No data filed     Foot exam unremarkable.  ? Cramping from electrolyte abnormalities. Will check lytes as well today, including magnesium.  She is describing some symptoms of diabetic neuropathy at the base of her feet as well.  Discusssed Gabapentin or other tx options and she would like to hold of on that.        Other Visit Diagnoses    Leg cramping       Relevant Orders   Magnesium      I am having Berna Bue maintain her glucose blood, busPIRone, omeprazole, pravastatin, Januvia, metFORMIN, lisinopril, and glipiZIDE.  No orders of the defined types were placed in this encounter.   This visit occurred during the SARS-CoV-2 public health emergency.  Safety protocols were in place, including screening questions prior to the visit, additional usage of staff PPE, and extensive cleaning of exam room while observing appropriate contact time as indicated for disinfecting solutions.    Arnette Norris, MD

## 2019-08-22 ENCOUNTER — Ambulatory Visit (INDEPENDENT_AMBULATORY_CARE_PROVIDER_SITE_OTHER): Payer: Medicare PPO | Admitting: Family Medicine

## 2019-08-22 ENCOUNTER — Encounter: Payer: Self-pay | Admitting: Family Medicine

## 2019-08-22 ENCOUNTER — Other Ambulatory Visit: Payer: Self-pay

## 2019-08-22 VITALS — BP 106/64 | HR 64 | Temp 98.4°F

## 2019-08-22 DIAGNOSIS — R252 Cramp and spasm: Secondary | ICD-10-CM

## 2019-08-22 DIAGNOSIS — F4321 Adjustment disorder with depressed mood: Secondary | ICD-10-CM

## 2019-08-22 DIAGNOSIS — E1121 Type 2 diabetes mellitus with diabetic nephropathy: Secondary | ICD-10-CM

## 2019-08-22 DIAGNOSIS — G6289 Other specified polyneuropathies: Secondary | ICD-10-CM | POA: Diagnosis not present

## 2019-08-22 DIAGNOSIS — N1832 Chronic kidney disease, stage 3b: Secondary | ICD-10-CM | POA: Diagnosis not present

## 2019-08-22 DIAGNOSIS — E1140 Type 2 diabetes mellitus with diabetic neuropathy, unspecified: Secondary | ICD-10-CM | POA: Insufficient documentation

## 2019-08-22 DIAGNOSIS — I10 Essential (primary) hypertension: Secondary | ICD-10-CM | POA: Diagnosis not present

## 2019-08-22 DIAGNOSIS — E785 Hyperlipidemia, unspecified: Secondary | ICD-10-CM

## 2019-08-22 NOTE — Addendum Note (Signed)
Addended by: Lynnea Ferrier on: 08/22/2019 11:48 AM   Modules accepted: Orders

## 2019-08-22 NOTE — Assessment & Plan Note (Signed)
History: Doing well.  Depression screen [facility] 2/9 08/22/2019 07/15/2018 09/14/2017 02/20/2016 01/25/2014  Decreased Interest 0 1 0 0 0  Down, Depressed, Hopeless 0 1 0 0 0  PHQ - 2 Score 0 2 0 0 0  Altered sleeping - 2 - - -  Tired, decreased energy - 2 - - -  Change in appetite - 2 - - -  Feeling bad or failure about yourself  - 1 - - -  Trouble concentrating - 0 - - -  Moving slowly or fidgety/restless - 2 - - -  Suicidal thoughts - 0 - - -  PHQ-9 Score - 11 - - -  Difficult doing work/chores - Very difficult - - -

## 2019-08-22 NOTE — Assessment & Plan Note (Addendum)
Diabetic Foot Exam - Simple   No data filed     Foot exam unremarkable.  ? Cramping from electrolyte abnormalities. Will check lytes as well today, including magnesium.  She is describing some symptoms of diabetic neuropathy at the base of her feet as well.  Discusssed Gabapentin or other tx options and she would like to hold of on that.

## 2019-08-22 NOTE — Assessment & Plan Note (Signed)
Well controlled on current regimen. LDL is at goal on current statin dose, patient has no side effects from medication and LFTS are normal. No changes today.

## 2019-08-23 ENCOUNTER — Other Ambulatory Visit (INDEPENDENT_AMBULATORY_CARE_PROVIDER_SITE_OTHER): Payer: Medicare PPO

## 2019-08-23 DIAGNOSIS — R252 Cramp and spasm: Secondary | ICD-10-CM | POA: Diagnosis not present

## 2019-08-23 DIAGNOSIS — E1121 Type 2 diabetes mellitus with diabetic nephropathy: Secondary | ICD-10-CM

## 2019-08-23 DIAGNOSIS — F4321 Adjustment disorder with depressed mood: Secondary | ICD-10-CM

## 2019-08-23 DIAGNOSIS — E785 Hyperlipidemia, unspecified: Secondary | ICD-10-CM | POA: Diagnosis not present

## 2019-08-23 DIAGNOSIS — G6289 Other specified polyneuropathies: Secondary | ICD-10-CM

## 2019-08-23 DIAGNOSIS — N1832 Chronic kidney disease, stage 3b: Secondary | ICD-10-CM | POA: Diagnosis not present

## 2019-08-23 DIAGNOSIS — I10 Essential (primary) hypertension: Secondary | ICD-10-CM | POA: Diagnosis not present

## 2019-08-23 LAB — CBC WITH DIFFERENTIAL/PLATELET
Basophils Absolute: 0.1 10*3/uL (ref 0.0–0.1)
Basophils Relative: 1.1 % (ref 0.0–3.0)
Eosinophils Absolute: 0.3 10*3/uL (ref 0.0–0.7)
Eosinophils Relative: 3.7 % (ref 0.0–5.0)
HCT: 36 % (ref 36.0–46.0)
Hemoglobin: 11.7 g/dL — ABNORMAL LOW (ref 12.0–15.0)
Lymphocytes Relative: 33.2 % (ref 12.0–46.0)
Lymphs Abs: 2.4 10*3/uL (ref 0.7–4.0)
MCHC: 32.6 g/dL (ref 30.0–36.0)
MCV: 92.5 fl (ref 78.0–100.0)
Monocytes Absolute: 0.5 10*3/uL (ref 0.1–1.0)
Monocytes Relative: 7.3 % (ref 3.0–12.0)
Neutro Abs: 3.9 10*3/uL (ref 1.4–7.7)
Neutrophils Relative %: 54.7 % (ref 43.0–77.0)
Platelets: 245 10*3/uL (ref 150.0–400.0)
RBC: 3.89 Mil/uL (ref 3.87–5.11)
RDW: 14.3 % (ref 11.5–15.5)
WBC: 7.2 10*3/uL (ref 4.0–10.5)

## 2019-08-23 LAB — COMPREHENSIVE METABOLIC PANEL
ALT: 27 U/L (ref 0–35)
AST: 21 U/L (ref 0–37)
Albumin: 4.1 g/dL (ref 3.5–5.2)
Alkaline Phosphatase: 85 U/L (ref 39–117)
BUN: 13 mg/dL (ref 6–23)
CO2: 26 mEq/L (ref 19–32)
Calcium: 9.2 mg/dL (ref 8.4–10.5)
Chloride: 108 mEq/L (ref 96–112)
Creatinine, Ser: 0.96 mg/dL (ref 0.40–1.20)
GFR: 57.16 mL/min — ABNORMAL LOW (ref >60.00)
Glucose, Bld: 113 mg/dL — ABNORMAL HIGH (ref 70–99)
Potassium: 4.2 mEq/L (ref 3.5–5.1)
Sodium: 141 mEq/L (ref 135–145)
Total Bilirubin: 0.3 mg/dL (ref 0.2–1.2)
Total Protein: 7.3 g/dL (ref 6.0–8.3)

## 2019-08-23 LAB — LIPID PANEL
Cholesterol: 139 mg/dL (ref 0–200)
HDL: 38.2 mg/dL — ABNORMAL LOW (ref >39.00)
NonHDL: 100.64
Total CHOL/HDL Ratio: 4
Triglycerides: 207 mg/dL — ABNORMAL HIGH (ref 0.0–149.0)
VLDL: 41.4 mg/dL — ABNORMAL HIGH (ref 0.0–40.0)

## 2019-08-23 LAB — TSH: TSH: 3.57 u[IU]/mL (ref 0.35–4.50)

## 2019-08-23 LAB — RENAL FUNCTION PANEL
Albumin: 4.1 g/dL (ref 3.5–5.2)
BUN: 13 mg/dL (ref 6–23)
CO2: 26 mEq/L (ref 19–32)
Calcium: 9.2 mg/dL (ref 8.4–10.5)
Chloride: 108 mEq/L (ref 96–112)
Creatinine, Ser: 0.96 mg/dL (ref 0.40–1.20)
GFR: 57.16 mL/min — ABNORMAL LOW (ref >60.00)
Glucose, Bld: 113 mg/dL — ABNORMAL HIGH (ref 70–99)
Phosphorus: 3 mg/dL (ref 2.3–4.6)
Potassium: 4.2 mEq/L (ref 3.5–5.1)
Sodium: 141 mEq/L (ref 135–145)

## 2019-08-23 LAB — VITAMIN D 25 HYDROXY (VIT D DEFICIENCY, FRACTURES): VITD: 21.7 ng/mL — ABNORMAL LOW (ref 30.00–100.00)

## 2019-08-23 LAB — LDL CHOLESTEROL, DIRECT: Direct LDL: 75 mg/dL

## 2019-08-23 LAB — HEMOGLOBIN A1C: Hgb A1c MFr Bld: 8 % — ABNORMAL HIGH (ref 4.6–6.5)

## 2019-08-23 LAB — MAGNESIUM: Magnesium: 1.5 mg/dL (ref 1.5–2.5)

## 2019-08-25 ENCOUNTER — Other Ambulatory Visit: Payer: Self-pay

## 2019-08-25 ENCOUNTER — Encounter: Payer: Self-pay | Admitting: Family Medicine

## 2019-08-25 DIAGNOSIS — E559 Vitamin D deficiency, unspecified: Secondary | ICD-10-CM

## 2019-08-25 MED ORDER — VITAMIN D (ERGOCALCIFEROL) 1.25 MG (50000 UNIT) PO CAPS: 50000.0000 [IU] | ORAL_CAPSULE | ORAL | 0 refills | Status: AC

## 2019-08-25 NOTE — Progress Notes (Signed)
Sent in Rx for Vit-Roxane [place]/Created future order for lab/pt viewed via MyChart/thx dmf

## 2019-08-26 ENCOUNTER — Other Ambulatory Visit: Payer: Self-pay | Admitting: Family Medicine

## 2019-08-26 DIAGNOSIS — H2513 Age-related nuclear cataract, bilateral: Secondary | ICD-10-CM | POA: Diagnosis not present

## 2019-08-26 DIAGNOSIS — H5213 Myopia, bilateral: Secondary | ICD-10-CM | POA: Diagnosis not present

## 2019-08-26 DIAGNOSIS — E119 Type 2 diabetes mellitus without complications: Secondary | ICD-10-CM | POA: Diagnosis not present

## 2019-08-26 DIAGNOSIS — E1121 Type 2 diabetes mellitus with diabetic nephropathy: Secondary | ICD-10-CM

## 2019-08-26 LAB — HM DIABETES EYE EXAM

## 2019-09-03 ENCOUNTER — Other Ambulatory Visit: Payer: Self-pay | Admitting: Family Medicine

## 2019-09-05 NOTE — Telephone Encounter (Signed)
Last fill for Met 02/22/19  #180/1 Last fill for Bus 08/25/18  #180/1 Last OV 08/22/19

## 2019-09-07 ENCOUNTER — Other Ambulatory Visit: Payer: Self-pay | Admitting: Family Medicine

## 2019-09-18 ENCOUNTER — Ambulatory Visit: Payer: Medicare PPO | Attending: Internal Medicine

## 2019-09-18 DIAGNOSIS — Z23 Encounter for immunization: Secondary | ICD-10-CM | POA: Insufficient documentation

## 2019-09-18 NOTE — Progress Notes (Signed)
   Covid-19 Vaccination Clinic  Name:  Wanda [name]    MRN: [id number] DOB: June 02, 1946  09/18/2019  [name] was observed post Covid-19 immunization for 15 minutes without incidence. She was provided with Vaccine Information Sheet and instruction to access the V-Safe system.   [name] was instructed to call 911 with any severe reactions post vaccine: Marland Kitchen Difficulty breathing  . Swelling of your face and throat  . A fast heartbeat  . A bad rash all over your body  . Dizziness and weakness    Immunizations Administered    Name Date Dose VIS Date Route   Pfizer COVID-19 Vaccine 09/18/2019  2:42 PM 0.3 mL 07/15/2019 Intramuscular   Manufacturer: [place]   Lot: [id number]   [place]: [id number]

## 2019-09-28 ENCOUNTER — Other Ambulatory Visit: Payer: Self-pay

## 2019-09-30 ENCOUNTER — Other Ambulatory Visit: Payer: Self-pay

## 2019-09-30 ENCOUNTER — Encounter: Payer: Self-pay | Admitting: Internal Medicine

## 2019-09-30 ENCOUNTER — Ambulatory Visit: Payer: Medicare PPO | Admitting: Internal Medicine

## 2019-09-30 VITALS — BP 132/82 | HR 82 | Temp 98.2°F | Ht 65.0 in | Wt 144.8 lb

## 2019-09-30 DIAGNOSIS — E1165 Type 2 diabetes mellitus with hyperglycemia: Secondary | ICD-10-CM | POA: Diagnosis not present

## 2019-09-30 DIAGNOSIS — E1122 Type 2 diabetes mellitus with diabetic chronic kidney disease: Secondary | ICD-10-CM

## 2019-09-30 DIAGNOSIS — N1831 Chronic kidney disease, stage 3a: Secondary | ICD-10-CM | POA: Diagnosis not present

## 2019-09-30 DIAGNOSIS — E1121 Type 2 diabetes mellitus with diabetic nephropathy: Secondary | ICD-10-CM | POA: Diagnosis not present

## 2019-09-30 LAB — GLUCOSE, POCT (MANUAL RESULT ENTRY): POC Glucose: 227 mg/dl — AB (ref 70–99)

## 2019-09-30 MED ORDER — RYBELSUS 3 MG PO TABS: 3.0000 mg | ORAL_TABLET | Freq: Every day | ORAL | 6 refills | Status: DC

## 2019-09-30 MED ORDER — METFORMIN HCL ER 500 MG PO TB24: 1500.0000 mg | ORAL_TABLET | Freq: Every day | ORAL | 1 refills | Status: DC

## 2019-09-30 MED ORDER — GLIPIZIDE 10 MG PO TABS: 5.0000 mg | ORAL_TABLET | Freq: Every day | ORAL | 6 refills | Status: DC

## 2019-09-30 MED ORDER — GLIPIZIDE 5 MG PO TABS: 5.0000 mg | ORAL_TABLET | Freq: Every day | ORAL | 6 refills | Status: DC

## 2019-09-30 NOTE — Patient Instructions (Signed)
-   Stop Januvia - Stop extended release Glipizide ( XL) - Continue Metformin 500 mg , 3 tablet with breakfast  - Start Rybelsus 3 mg daily with Breakfast  - Stat Glipizide 5 mg before Breakfast     - Try and check your sugar before breakfast and bedtime when you can   - Choose healthy, lower carb lower calorie snacks: toss salad, cooked vegetables, cottage cheese, peanut butter, low fat cheese / string cheese, lower sodium deli meat, tuna salad or chicken salad     HOW TO TREAT LOW BLOOD SUGARS (Blood sugar LESS THAN 70 MG/DL)  Please follow the RULE OF 15 for the treatment of hypoglycemia treatment (when your (blood sugars are less than 70 mg/dL)    STEP 1: Take 15 grams of carbohydrates when your blood sugar is low, which includes:   3-4 GLUCOSE TABS  OR  3-4 OZ OF JUICE OR REGULAR SODA OR  ONE TUBE OF GLUCOSE GEL     STEP 2: RECHECK blood sugar in 15 MINUTES STEP 3: If your blood sugar is still low at the 15 minute recheck --> then, go back to STEP 1 and treat AGAIN with another 15 grams of carbohydrates.

## 2019-09-30 NOTE — Progress Notes (Signed)
Name: Wanda [name]  MRN/ DOB: [id number], 04/18/1946   Age/ Sex: 74 y.o., female    PCP: Lucille Passy, MD   Reason for Endocrinology Evaluation: Type 2 Diabetes Mellitus     Date of Initial Endocrinology Visit: 10/03/2019     PATIENT IDENTIFIER: Wanda [name] is a 74 y.o. female with a past medical history of T2DM, dislipidemia and CKD. The patient presented for initial endocrinology clinic visit on 10/03/2019 for consultative assistance with her diabetes management.    HPI: Wanda [name] was    Diagnosed with DM in at age 35 Prior Medications tried/Intolerance: as listed  Currently checking blood sugars 0 x / day Hypoglycemia episodes : no               Hemoglobin A1c has ranged from 7.1% in 2017, peaking at 8.3%in 2020. Patient required assistance for hypoglycemia: no Patient has required hospitalization within the last 1 year from hyper or hypoglycemia: no   In terms of diet, the patient eats 3 meals a day , snacks at night . Drinks diet coke.    HOME DIABETES REGIMEN: Metformin 500 mg 2 QAM and 1 in the afternoon - but forgets the 2nd dose Januvia 100 mg daily  Glipizide XL 10 mg     Statin: Yes ACE-I/ARB: Yes Prior Diabetic Education: yes   METER DOWNLOAD SUMMARY: Does not check    DIABETIC COMPLICATIONS: Microvascular complications:  CKD III Denies: neuropathy  Last eye exam: Completed 08/2019  Macrovascular complications:   Denies: CAD, PVD, CVA   PAST HISTORY: Past Medical History:  Past Medical History:  Diagnosis Date  . Colon polyps   . DM (diabetes mellitus) ([place])   . Fibromyalgia    "neurofibromyalgia"  . Headache   . HTN (hypertension)   . Hyperlipidemia    Past Surgical History:  Past Surgical History:  Procedure Laterality Date  . WRIST SURGERY  1969    Social History:  reports that she has never smoked. She has never used smokeless tobacco. She reports that she does not drink alcohol or use drugs. Family History:  Family History   Problem Relation Age of Onset  . Heart attack Unknown   . Hypertension Mother   . Hyperlipidemia Mother   . Stroke Mother   . Diabetes Sister   . Parkinson's disease Brother      HOME MEDICATIONS: Allergies as of 09/30/2019      Reactions   Niacin    REACTION: unbearable hot flashes      Medication List       Accurate as of September 30, 2019 11:59 PM. If you have any questions, ask your nurse or doctor.        STOP taking these medications   glipiZIDE 10 MG 24 hr tablet Commonly known as: GLUCOTROL XL Replaced by: glipiZIDE 5 MG tablet Stopped by: Dorita Sciara, MD   Januvia 100 MG tablet Generic drug: sitaGLIPtin Stopped by: Dorita Sciara, MD     TAKE these medications   busPIRone 15 MG tablet Commonly known as: BUSPAR TAKE 1 TABLET BY MOUTH 2 TIMES DAILY. What changed:  how much to take how to take this when to take this additional instructions   glipiZIDE 5 MG tablet Commonly known as: GLUCOTROL Take 1 tablet (5 mg total) by mouth daily before breakfast. Replaces: glipiZIDE 10 MG 24 hr tablet Started by: Dorita Sciara, MD   glucose blood test strip Commonly known as: Accu-Chek Aviva Plus USE  AS INSTRUCTED DAILY.   lisinopril 10 MG tablet Commonly known as: ZESTRIL TAKE 1 TABLET BY MOUTH DAILY.   metFORMIN 500 MG 24 hr tablet Commonly known as: GLUCOPHAGE-XR Take 3 tablets (1,500 mg total) by mouth daily with breakfast. What changed: See the new instructions. Changed by: Dorita Sciara, MD   omeprazole 20 MG capsule Commonly known as: PRILOSEC Take 1 capsule (20 mg total) by mouth daily as needed.   pravastatin 20 MG tablet Commonly known as: PRAVACHOL Take 1qd with evening meal   Rybelsus 3 MG Tabs Generic drug: Semaglutide Take 3 mg by mouth daily with breakfast. Started by: Dorita Sciara, MD   Vitamin D (Ergocalciferol) 1.25 MG (50000 UNIT) Caps capsule Commonly known as: DRISDOL Take 1 capsule  (50,000 Units total) by mouth every 7 (seven) days.        ALLERGIES: Allergies  Allergen Reactions  . Niacin     REACTION: unbearable hot flashes     REVIEW OF SYSTEMS: A comprehensive ROS was conducted with the patient and is negative except as per HPI and below:  Review of Systems  Gastrointestinal: Positive for constipation. Negative for nausea.  Neurological: Positive for tingling.  Psychiatric/Behavioral: Positive for depression.      OBJECTIVE:   VITAL SIGNS: BP 132/82 (BP Location: Left Arm, Patient Position: Sitting, Cuff Size: Large)   Pulse 82   Temp 98.2 F (36.8 C)   Ht 5\' 5"  (1.651 m)   Wt 144 lb 12.8 oz (65.7 kg)   SpO2 98%   BMI 24.10 kg/m    PHYSICAL EXAM:  General: Wanda [name] appears well and is in NAD  Neck: General: Supple without adenopathy or carotid bruits. Thyroid: Thyroid size normal.  No goiter or nodules appreciated. No thyroid bruit.  Lungs: Clear with good BS bilat with no rales, rhonchi, or wheezes  Heart: RRR with normal S1 and S2 and no gallops; no murmurs; no rub  Abdomen: Normoactive bowel sounds, soft, nontender, without masses or organomegaly palpable  Extremities:  Lower extremities - No pretibial edema. No lesions.  Skin: Normal texture and temperature to palpation.  Neuro: MS is good with appropriate affect, Wanda [name] is alert and Ox3    DATA REVIEWED:  Lab Results  Component Value Date   HGBA1C 8.0 (H) 08/23/2019   HGBA1C 8.2 (H) 05/18/2019   HGBA1C 8.2 (H) 11/26/2018   Lab Results  Component Value Date   MICROALBUR 1.3 10/02/2014   LDLCALC 84 05/05/2017   CREATININE 0.96 08/23/2019   CREATININE 0.96 08/23/2019   Lab Results  Component Value Date   MICRALBCREAT 0.8 10/02/2014    Lab Results  Component Value Date   CHOL 139 08/23/2019   HDL 38.20 (L) 08/23/2019   LDLCALC 84 05/05/2017   LDLDIRECT 75.0 08/23/2019   TRIG 207.0 (H) 08/23/2019   CHOLHDL 4 08/23/2019        ASSESSMENT / PLAN / RECOMMENDATIONS:   1)  Type 2 Diabetes Mellitus, poorly controlled, With CKDIII complications - Most recent A1c of 8.0 %. Goal A1c < 7.0%.    Plan: GENERAL: I have discussed with the patient the pathophysiology of diabetes. We went over the natural progression of the disease. We talked about both insulin resistance and insulin deficiency. We stressed the importance of lifestyle changes including diet and exercise. I explained the complications associated with diabetes including retinopathy, nephropathy, neuropathy as well as increased risk of cardiovascular disease. We went over the benefit seen with glycemic control.   I explained  to the patient that diabetic patients are at higher than normal risk for amputations. We discussed switching extended release glipizide to regular release, due to increased risk of hypoglycemia in the setting of extended release sulfonylureas, age over 31 and CKD We have discussed add-on therapy with Rybelsus, we discussed GI side effects, we will start with a small dose to be taken with breakfast, patient encouraged to contact us with any side effects. We will stop Januvia as it is not recommended to combine DPP 4 inhibitors and GLP-1 agonist due to increased risk of pancreatitis.  MEDICATIONS: - Stop Januvia - Stop extended release Glipizide ( XL) - Continue Metformin 500 mg , 3 tablet with breakfast  - Start Rybelsus 3 mg daily with Breakfast  - Stat Glipizide 5 mg before Breakfast    EDUCATION / INSTRUCTIONS: BG monitoring instructions: Patient is instructed to check her blood sugars 2 times a day, fasting and bedtime when possible. Call [facility] if: BG persistently < 70 or > 300. I reviewed the Rule of 15 for the treatment of hypoglycemia in detail with the patient. Literature supplied.   2) Diabetic complications:  Eye: Does not have known diabetic retinopathy.  Neuro/ Feet: Does not have known diabetic peripheral neuropathy. Renal: Patient does  have known  baseline CKD. She is  on an ACEI/ARB at present.   3) Lipids: Patient is on pravastatin.  Tolerating this well.    Follow-up in 3 months       Signed electronically by: Mack Guise, MD  [facility]  [facility] [address] [place], [place] [address] Phone: [phone] FAX: [phone]   CC: Lucille Passy, MD No address on file Phone: None  Fax: None    Return to Endocrinology clinic as below: Future Appointments  Date Time Provider [place]  10/11/2019  5:30 PM [place]  12/01/2019 10:00 AM Ronnald Nian, DO [facility]  12/27/2019  3:40 PM Shray Hunley, Melanie Crazier, MD [facility] None

## 2019-10-03 DIAGNOSIS — E1122 Type 2 diabetes mellitus with diabetic chronic kidney disease: Secondary | ICD-10-CM | POA: Insufficient documentation

## 2019-10-03 DIAGNOSIS — E1165 Type 2 diabetes mellitus with hyperglycemia: Secondary | ICD-10-CM | POA: Insufficient documentation

## 2019-10-03 DIAGNOSIS — N1831 Chronic kidney disease, stage 3a: Secondary | ICD-10-CM | POA: Insufficient documentation

## 2019-10-11 ENCOUNTER — Ambulatory Visit: Payer: Medicare PPO | Attending: Internal Medicine

## 2019-10-11 DIAGNOSIS — Z23 Encounter for immunization: Secondary | ICD-10-CM | POA: Insufficient documentation

## 2019-10-11 NOTE — Progress Notes (Signed)
   Covid-19 Vaccination Clinic  Name:  Wanda [name]    MRN: [id number] DOB: [birth year]  10/11/2019  Wanda [name] was observed post Covid-19 immunization for 15 minutes without incident. She was provided with Vaccine Information Sheet and instruction to access the V-Safe system.   Wanda [name] was instructed to call 911 with any severe reactions post vaccine: Marland Kitchen Difficulty breathing  . Swelling of face and throat  . A fast heartbeat  . A bad rash all over body  . Dizziness and weakness   Immunizations Administered    Name Date Dose VIS Date Route   Pfizer COVID-19 Vaccine 10/11/2019  4:53 PM 0.3 mL 07/15/2019 Intramuscular   Manufacturer: [place]   Lot: [id number]   [place]: [id number]

## 2019-10-12 ENCOUNTER — Ambulatory Visit: Payer: Medicare PPO

## 2019-10-12 ENCOUNTER — Telehealth: Payer: Self-pay | Admitting: Internal Medicine

## 2019-10-12 NOTE — Telephone Encounter (Signed)
Please advise 

## 2019-10-12 NOTE — Telephone Encounter (Signed)
Patient called to advise that the Rybelsus has caused her nausea and this past Friday an entire day of vomiting.  She stopped taking medication and has been averaging blood sugars of 130's in the AM.  Patient would like to go several weeks without taking Rybelsus to see if with diet and exercise she can avoid taking the Rybelsus due to the side -effect.   Please contact patient to advise [phone]

## 2019-10-12 NOTE — Telephone Encounter (Signed)
Pt informed

## 2019-11-15 ENCOUNTER — Other Ambulatory Visit: Payer: Self-pay

## 2019-11-15 MED ORDER — PRAVASTATIN SODIUM 20 MG PO TABS: ORAL_TABLET | ORAL | 0 refills | Status: DC

## 2019-11-21 ENCOUNTER — Telehealth: Payer: Self-pay | Admitting: Family Medicine

## 2019-11-21 ENCOUNTER — Encounter: Payer: Self-pay | Admitting: Family

## 2019-11-21 ENCOUNTER — Other Ambulatory Visit: Payer: Self-pay

## 2019-11-21 ENCOUNTER — Ambulatory Visit: Payer: Medicare PPO | Admitting: Family

## 2019-11-21 VITALS — BP 112/72 | HR 80 | Temp 97.1°F | Ht 65.0 in | Wt 142.2 lb

## 2019-11-21 DIAGNOSIS — M79622 Pain in left upper arm: Secondary | ICD-10-CM

## 2019-11-21 DIAGNOSIS — R1013 Epigastric pain: Secondary | ICD-10-CM | POA: Diagnosis not present

## 2019-11-21 DIAGNOSIS — F4321 Adjustment disorder with depressed mood: Secondary | ICD-10-CM | POA: Diagnosis not present

## 2019-11-21 DIAGNOSIS — R1084 Generalized abdominal pain: Secondary | ICD-10-CM | POA: Diagnosis not present

## 2019-11-21 NOTE — Telephone Encounter (Signed)
Pt said she has diarrhea, when pt eats she said it stays logged in chest, she said the lymphnodes in her arm stay swollen and they hurt. Pt said she has no fever and that she has already had both covid shots and she wants to be seen in office, she said she does not want a virtual visit. Please advise [phone]. She is aware this would be just for an acute visit until she establishes with Dr Bryan Lemma.

## 2019-11-21 NOTE — Progress Notes (Signed)
Acute Office Visit  Subjective:    Patient ID: Wanda [name], female    DOB: 01-Apr-1946, 74 y.o.   MRN: [id number]  Chief Complaint  Patient presents with  . constipation/diarrhea    4-6 weeks  . Abdominal Pain  . Left axilla pain    HPI Patient is in today with c/o bouts of constipation, diarrhea, abdominal fullness, epigastric discomfort x 6 months off and on. She reports having 4 episodes of diarrhea in a single day followed by constipation. 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 Denies any blood in her stools or dark black stools. Colonoscopy was in Aug 2020 but unsure if this issues came after or was before that procedure. Reports sister having similar symptoms  Also has concerns of pain in the left axilla that has been going on 2-3 years. Pain is worsening. Had a normal mammogram. No redness or swelling.   She is concerned about cancer. He husband died in 11-19-17 of cancer. She feels she has been doing ok adjusting but does not really have time to cry because she is so busy. She has a disabled son that is having a hard time coping with the death of his father. She is trying to make accomodation for him to mover downstairs with her out of his apartment. 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   Past Medical History:  Diagnosis Date  . Colon polyps   . DM (diabetes mellitus) ([place])   . Fibromyalgia    "neurofibromyalgia"  . Headache   . HTN (hypertension)   . Hyperlipidemia     Past Surgical History:  Procedure Laterality Date  . WRIST SURGERY  1967-11-20    Family History  Problem Relation Age of Onset  . Heart attack Unknown   . Hypertension Mother   . Hyperlipidemia Mother   . Stroke Mother   . Diabetes Sister   . Parkinson's disease Brother     Social History   Socioeconomic History  . Marital status: Widowed    Spouse name:  Not on file  . Number of children: 2  . Years of education: 80  . Highest education level: Not on file  Occupational History    Comment: retired Pharmacist, hospital  Tobacco Use  . Smoking status: Never Smoker  . Smokeless tobacco: Never Used  Substance and Sexual Activity  . Alcohol use: No    Alcohol/week: 0.0 standard drinks  . Drug use: No  . Sexual activity: Never  Other Topics Concern  . Not on file  Social History Narrative   Lives with husband    caffeine - none   Social Determinants of Health   Financial Resource Strain:   . Difficulty of Paying Living Expenses:   Food Insecurity:   . Worried About Charity fundraiser in the Last Year:   . Arboriculturist in the Last Year:   Transportation Needs:   . Film/video editor (Medical):   Marland Kitchen Lack of Transportation (Non-Medical):   Physical Activity:   . Days of Exercise per Week:   . Minutes of Exercise per Session:   Stress:   . Feeling of Stress :   Social Connections:   . Frequency of Communication with Friends and Family:   . Frequency of Social Gatherings with Friends and Family:   .  Attends Religious Services:   . Active Member of Clubs or Organizations:   . Attends Archivist Meetings:   Marland Kitchen Marital Status:   Intimate Partner Violence:   . Fear of Current or Ex-Partner:   . Emotionally Abused:   Marland Kitchen Physically Abused:   . Sexually Abused:     Outpatient Medications Prior to Visit  Medication Sig Dispense Refill  . busPIRone (BUSPAR) 15 MG tablet TAKE 1 TABLET BY MOUTH 2 TIMES DAILY. (Patient taking differently: Take 15 mg by mouth daily. 10 mg once a day) 180 tablet 1  . glipiZIDE (GLUCOTROL) 5 MG tablet Take 1 tablet (5 mg total) by mouth daily before breakfast. 30 tablet 6  . glucose blood (ACCU-CHEK AVIVA PLUS) test strip USE AS INSTRUCTED DAILY. 50 each PRN  . lisinopril (ZESTRIL) 10 MG tablet TAKE 1 TABLET BY MOUTH DAILY. 90 tablet 2  . metFORMIN (GLUCOPHAGE-XR) 500 MG 24 hr tablet Take 3 tablets  (1,500 mg total) by mouth daily with breakfast. 180 tablet 1  . omeprazole (PRILOSEC) 20 MG capsule Take 1 capsule (20 mg total) by mouth daily as needed. 90 capsule 1  . pravastatin (PRAVACHOL) 20 MG tablet Take 1qd with evening meal 90 tablet 0  . Semaglutide (RYBELSUS) 3 MG TABS Take 3 mg by mouth daily with breakfast. 30 tablet 6   No facility-administered medications prior to visit.    Allergies  Allergen Reactions  . Niacin     REACTION: unbearable hot flashes    Review of Systems  Constitutional: Negative.   HENT: Negative.   Respiratory: Negative.   Cardiovascular: Negative.   Gastrointestinal: Positive for abdominal pain, constipation, diarrhea, nausea and vomiting. Negative for blood in stool.  Musculoskeletal: Negative.   Skin: Negative.   Neurological: Negative.   Hematological: Negative.   Psychiatric/Behavioral:       Stressed       Objective:    Physical Exam Constitutional:      Appearance: She is well-developed and normal weight.  Cardiovascular:     Rate and Rhythm: Normal rate.     Heart sounds: Normal heart sounds.  Pulmonary:     Effort: Pulmonary effort is normal.     Breath sounds: Normal breath sounds.  Abdominal:     General: Abdomen is protuberant. Bowel sounds are normal.     Palpations: Abdomen is rigid.     Tenderness: There is generalized abdominal tenderness. There is no right CVA tenderness or guarding. Negative signs include Murphy's sign and McBurney's sign.  Skin:    General: Skin is warm.  Neurological:     Mental Status: She is alert and oriented to person, place, and time.     BP 112/72 (BP Location: Left Arm, Patient Position: Sitting, Cuff Size: Normal)   Pulse 80   Temp (!) 97.1 F (36.2 C) (Temporal)   Ht 5\' 5"  (1.651 m)   Wt 142 lb 4 oz (64.5 kg)   SpO2 95%   BMI 23.67 kg/m  Wt Readings from Last 3 Encounters:  11/21/19 142 lb 4 oz (64.5 kg)  09/30/19 144 lb 12.8 oz (65.7 kg)  05/18/19 145 lb 8 oz (66 kg)     Health Maintenance Due  Topic Date Due  . FOOT EXAM  05/25/2019    There are no preventive care reminders to display for this patient.   Lab Results  Component Value Date   TSH 3.57 08/23/2019   Lab Results  Component Value Date   WBC 7.2  08/23/2019   HGB 11.7 (L) 08/23/2019   HCT 36.0 08/23/2019   MCV 92.5 08/23/2019   PLT 245.0 08/23/2019   Lab Results  Component Value Date   NA 141 08/23/2019   NA 141 08/23/2019   K 4.2 08/23/2019   K 4.2 08/23/2019   CO2 26 08/23/2019   CO2 26 08/23/2019   GLUCOSE 113 (H) 08/23/2019   GLUCOSE 113 (H) 08/23/2019   BUN 13 08/23/2019   BUN 13 08/23/2019   CREATININE 0.96 08/23/2019   CREATININE 0.96 08/23/2019   BILITOT 0.3 08/23/2019   ALKPHOS 85 08/23/2019   AST 21 08/23/2019   ALT 27 08/23/2019   PROT 7.3 08/23/2019   ALBUMIN 4.1 08/23/2019   ALBUMIN 4.1 08/23/2019   CALCIUM 9.2 08/23/2019   CALCIUM 9.2 08/23/2019   ANIONGAP 14 06/23/2014   GFR 57.16 (L) 08/23/2019   GFR 57.16 (L) 08/23/2019   Lab Results  Component Value Date   CHOL 139 08/23/2019   Lab Results  Component Value Date   HDL 38.20 (L) 08/23/2019   Lab Results  Component Value Date   LDLCALC 84 05/05/2017   Lab Results  Component Value Date   TRIG 207.0 (H) 08/23/2019   Lab Results  Component Value Date   CHOLHDL 4 08/23/2019   Lab Results  Component Value Date   HGBA1C 8.0 (H) 08/23/2019       Assessment & Plan:   Problem List Items Addressed This Visit    Grief    Other Visit Diagnoses    Generalized abdominal pain    -  Primary   Relevant Orders   Ambulatory referral to Gastroenterology   Epigastric discomfort       Relevant Orders   Ambulatory referral to Gastroenterology   Axillary pain, left       Relevant Orders   [place] AXILLA LEFT      Deferred labs today because patient is a difficult stick and usually has to go to [facility] to have them drawn. Since we are referring to GI, she can have labs done once to prevent  sticking her multiple times. Refer to GI to rule out any malignant concerns (possible endoscopy). Refer to US of the soft tissue of the left axilla r/t pain and fullness. Will follow-up pending results.   Richie was seen today for constipation/diarrhea, abdominal pain and left axilla pain.  Diagnoses and all orders for this visit:  Generalized abdominal pain -     Ambulatory referral to Gastroenterology  Epigastric discomfort -     Ambulatory referral to Gastroenterology  Axillary pain, left -     [place] AXILLA LEFT; Future  Grief    No orders of the defined types were placed in this encounter.    Kennyth Arnold, FNP

## 2019-11-21 NOTE — Patient Instructions (Addendum)
Irritable Bowel Syndrome, Adult  Irritable bowel syndrome (IBS) is a group of symptoms that affects the organs responsible for digestion (gastrointestinal or GI tract). IBS is not one specific disease. To regulate how the GI tract works, the body sends signals back and forth between the intestines and the brain. If you have IBS, there may be a problem with these signals. As a result, the GI tract does not function normally. The intestines may become more sensitive and overreact to certain things. This may be especially true when you eat certain foods or when you are under stress. There are four types of IBS. These may be determined based on the consistency of your stool (feces):  IBS with diarrhea.  IBS with constipation.  Mixed IBS.  Unsubtyped IBS. It is important to know which type of IBS you have. Certain treatments are more likely to be helpful for certain types of IBS. What are the causes? The exact cause of IBS is not known. What increases the risk? You may have a higher risk for IBS if you:  Are female.  Are younger than 40.  Have a family history of IBS.  Have a mental health condition, such as depression, anxiety, or post-traumatic stress disorder.  Have had a bacterial infection of your GI tract. What are the signs or symptoms? Symptoms of IBS vary from person to person. The main symptom is abdominal pain or discomfort. Other symptoms usually include one or more of the following:  Diarrhea, constipation, or both.  Abdominal swelling or bloating.  Feeling full after eating a small or regular-sized meal.  Frequent gas.  Mucus in the stool.  A feeling of having more stool left after a bowel movement. Symptoms tend to come and go. They may be triggered by stress, mental health conditions, or certain foods. How is this diagnosed? This condition may be diagnosed based on a physical exam, your medical history, and your symptoms. You may have tests, such as:  Blood  tests.  Stool test.  X-rays.  CT scan.  Colonoscopy. This is a procedure in which your GI tract is viewed with a long, thin, flexible tube. How is this treated? There is no cure for IBS, but treatment can help relieve symptoms. Treatment depends on the type of IBS you have, and may include:  Changes to your diet, such as: ? Avoiding foods that cause symptoms. ? Drinking more water. ? Following a low-FODMAP (fermentable oligosaccharides, disaccharides, monosaccharides, and polyols) diet for up to 6 weeks, or as told by your health care provider. FODMAPs are sugars that are hard for some people to digest. ? Eating more fiber. ? Eating medium-sized meals at the same times every day.  Medicines. These may include: ? Fiber supplements, if you have constipation. ? Medicine to control diarrhea (antidiarrheal medicines). ? Medicine to help control muscle tightening (spasms) in your GI tract (antispasmodic medicines). ? Medicines to help with mental health conditions, such as antidepressants or tranquilizers.  Talk therapy or counseling.  Working with a diet and nutrition specialist (dietitian) to help create a food plan that is right for you.  Managing your stress. Follow these instructions at home: Eating and drinking  Eat a healthy diet.  Eat medium-sized meals at about the same time every day. Do not eat large meals.  Gradually eat more fiber-rich foods. These include whole grains, fruits, and vegetables. This may be especially helpful if you have IBS with constipation.  Eat a diet low in FODMAPs.  Drink enough   fluid to keep your urine pale yellow.  Keep a journal of foods that seem to trigger symptoms.  Avoid foods and drinks that: ? Contain added sugar. ? Make your symptoms worse. Dairy products, caffeinated drinks, and carbonated drinks can make symptoms worse for some people. General instructions  Take over-the-counter and prescription medicines and supplements only  as told by your health care provider.  Get enough exercise. Do at least 150 minutes of moderate-intensity exercise each week.  Manage your stress. Getting enough sleep and exercise can help you manage stress.  Keep all follow-up visits as told by your health care provider and therapist. This is important. Alcohol Use  Do not drink alcohol if: ? Your health care provider tells you not to drink. ? You are pregnant, may be pregnant, or are planning to become pregnant.  If you drink alcohol, limit how much you have: ? 0-1 drink a day for women. ? 0-2 drinks a day for men.  Be aware of how much alcohol is in your drink. In the [place] one drink equals one typical bottle of beer (12 oz), one-half glass of wine (5 oz), or one shot of hard liquor (1 oz). Contact a health care provider if you have:  Constant pain.  Weight [name].  Difficulty or pain when swallowing.  Diarrhea that gets worse. Get help right away if you have:  Severe abdominal pain.  Fever.  Diarrhea with symptoms of dehydration, such as dizziness or dry mouth.  Bright red blood in your stool.  Stool that is black and tarry.  Abdominal swelling.  Vomiting that does not stop.  Blood in your vomit. Summary  Irritable bowel syndrome (IBS) is not one specific disease. It is a group of symptoms that affects digestion.  Your intestines may become more sensitive and overreact to certain things. This may be especially true when you eat certain foods or when you are under stress.  There is no cure for IBS, but treatment can help relieve symptoms. This information is not intended to replace advice given to you by your health care provider. Make sure you discuss any questions you have with your health care provider. Document Revised: 07/14/2017 Document Reviewed: 07/14/2017 Elsevier Patient Education  Wanda [name], Adult People experience [name] in many different ways throughout their lives.  Events such as moving, changing jobs, and losing friends can create a sense of [name]. The [name] may be as serious as a major health change, divorce, death of a pet, or death of a loved one. All of these types of [name] are likely to create a physical and emotional reaction known as grief. Grief is the result of a major change or an absence of something or someone that you count on. Grief is a normal reaction to [name]. A variety of factors can affect your grieving experience, including:  The nature of your [name].  Your relationship to what or whom you lost.  Your understanding of grief and how to manage it.  Your support system. How to manage lifestyle changes Keep to your normal routine as much as possible.  If you have trouble focusing or doing normal activities, it is acceptable to take some time away from your normal routine.  Spend time with friends and loved ones.  Eat a healthy diet, get plenty of sleep, and rest when you feel tired. How to recognize changes  The way that you deal with your grief will affect your ability to function as  you normally do. When grieving, you may experience these changes:  Numbness, shock, sadness, anxiety, anger, denial, and guilt.  Thoughts about death.  Unexpected crying.  A physical sensation of emptiness in your stomach.  Problems sleeping and eating.  Tiredness (fatigue).  [name] of interest in normal activities.  Dreaming about or imagining seeing the person who died.  A need to remember what or whom you lost.  Difficulty thinking about anything other than your [name] for a period of time.  Relief. If you have been expecting the [name] for a while, you may feel a sense of relief when it happens. Follow these instructions at home:  Activity Express your feelings in healthy ways, such as:  Talking with others about your [name]. It may be helpful to find others who have had a similar [name], such as a support group.  Writing down your feelings  in a journal.  Doing physical activities to release stress and emotional energy.  Doing creative activities like painting, sculpting, or playing or listening to music.  Practicing resilience. This is the ability to recover and adjust after facing challenges. Reading some resources that encourage resilience may help you to learn ways to practice those behaviors. General instructions  Be patient with yourself and others. Allow the grieving process to happen, and remember that grieving takes time. ? It is likely that you may never feel completely done with some grief. You may find a way to move on while still cherishing memories and feelings about your [name]. ? Accepting your [name] is a process. It can take months or longer to adjust.  Keep all follow-up visits as told by your health care provider. This is important. Where to find support To get support for managing [name]:  Ask your health care provider for help and recommendations, such as grief counseling or therapy.  Think about joining a support group for people who are managing a [name]. Where to find more information You can find more information about managing [name] from:  American Society of Clinical Oncology: www.cancer.net  American Psychological Association: TVStereos.ch Contact a health care provider if:  Your grief is extreme and keeps getting worse.  You have ongoing grief that does not improve.  Your body shows symptoms of grief, such as illness.  You feel depressed, anxious, or lonely. Get help right away if:  You have thoughts about hurting yourself or others. If you ever feel like you may hurt yourself or others, or have thoughts about taking your own life, get help right away. You can go to your nearest emergency department or call:  Your local emergency services (911 in the [place]  A suicide crisis helpline, such as the [place] at 431-680-9908. This is open 24 hours a  day. Summary  Grief is the result of a major change or an absence of someone or something that you count on. Grief is a normal reaction to [name].  The depth of grief and the period of recovery depend on the type of [name] and your ability to adjust to the change and process your feelings.  Processing grief requires patience and a willingness to accept your feelings and talk about your [name] with people who are supportive.  It is important to find resources that work for you and to realize that people experience grief differently. There is not one grieving process that works for everyone in the same way.  Be aware that when grief becomes extreme, it can lead to more severe  issues like isolation, depression, anxiety, or suicidal thoughts. Talk with your health care provider if you have any of these issues. This information is not intended to replace advice given to you by your health care provider. Make sure you discuss any questions you have with your health care provider. Document Revised: 09/24/2018 Document Reviewed: 12/04/2016 Elsevier Patient Education  [place].

## 2019-11-21 NOTE — Telephone Encounter (Signed)
Scheduled

## 2019-11-23 ENCOUNTER — Ambulatory Visit: Payer: Medicare PPO | Admitting: Nurse Practitioner

## 2019-11-23 ENCOUNTER — Encounter: Payer: Self-pay | Admitting: Nurse Practitioner

## 2019-11-23 VITALS — BP 124/74 | HR 75 | Temp 97.8°F | Ht 64.0 in | Wt 143.2 lb

## 2019-11-23 DIAGNOSIS — R194 Change in bowel habit: Secondary | ICD-10-CM | POA: Diagnosis not present

## 2019-11-23 NOTE — Patient Instructions (Addendum)
If you are age 72 or older, your body mass index should be between 23-30. Your Body mass index is 24.59 kg/m. If this is out of the aforementioned range listed, please consider follow up with your Primary Care Provider.  If you are age 66 or younger, your body mass index should be between 19-25. Your Body mass index is 24.59 kg/m. If this is out of the aformentioned range listed, please consider follow up with your Primary Care Provider.   Be sure to start drinking at least 64 ounces of water daily.  START using Metamucil twice daily.  This weekend use Magnesium Citrate. Use as directed on the bottle.  You have been schedule to follow up in 4 weeks.

## 2019-11-23 NOTE — Progress Notes (Addendum)
ASSESSMENT / PLAN:   74 year old female with PMH significant for headaches, hypertension, hyperlipidemia, fibromyalgia, diabetes, anxiety, depression, colon polyp  # Constipation / loose stool --Overall, I think she is probably constipated.  She may go days without a bowel movement then has a formed stool followed by explosive loose stool.  --We will purge bowels over the weekend with bottle of magnesium Citrate --Increase water intake to 64 ounces daily --Start Metamucil twice daily --Get all of her colonoscopy reports from Dr. Collene Mares --Follow-up with me in 4 weeks  Addendum: I requested and received colonoscopy report from [facility] Colonoscopy 03/28/2019 --Done for personal history of colon polyps. Polyp pathology was not sent with records --Exam was complete, quality of the bowel preparation was adequate. --Findings included a 7 mm sessile proximal ascending colon polyp.  A small sessile polyp in the cecum.  Normal terminal ileum.  Exam otherwise normal. --Repeat colonoscopy was NOT recommended due to patient's age.    HPI:     Chief Complaint: Constipation and digestion problems   Wanda [name] is a 74 year old female referred by PCP for altered bowel habits.  Patient is known to Dr. Collene Mares, apparently had several colonoscopies with her.  Patient was not clear why she was sent to [facility].  I told her we were happy to get records from Dr. Collene Mares and assume her care or she could certainly be refunded her co-pay and remain with Dr. Collene Mares.  Patient says that since she was here she would go ahead and have [place] assume her GI care.  Patient is a several month history of alternating constipation diarrhea.  Frequently feels the need to have a bowel movement but unable. She may go 1.5 weeks without a bowel movement.  When she does have a bowel movement it is usually normal but followed by explosive loose stool.  She takes stool softener, not on any fiber supplements. No  other GI complaints   Data Reviewed:  08/23/2019 CMP unremarkable Hemoglobin 11.7, CBC otherwise unremarkable.  Baseline hemoglobin 11.5-12.5   Past Medical History:  Diagnosis Date  . Anxiety   . Colon polyps   . Depression   . DM (diabetes mellitus) ([place])   . Female bladder prolapse   . Fibromyalgia    "neurofibromyalgia"  . Headache   . HTN (hypertension)   . Hyperlipidemia     Past Surgical History:  Procedure Laterality Date  . COLONOSCOPY  03/28/2019   Dr Collene Mares  . WRIST SURGERY  1969   Family History  Problem Relation Age of Onset  . Heart attack Other   . Hypertension Mother   . Hyperlipidemia Mother   . Stroke Mother   . Diabetes Sister   . Parkinson's disease Brother   . Diabetes Sister   . Breast cancer Sister   . Colon cancer Neg Hx   . Esophageal cancer Neg Hx    Social History   Tobacco Use  . Smoking status: Never Smoker  . Smokeless tobacco: Never Used  Substance Use Topics  . Alcohol use: No    Alcohol/week: 0.0 standard drinks  . Drug use: No   Current Outpatient Medications  Medication Sig Dispense Refill  . busPIRone (BUSPAR) 15 MG tablet TAKE 1 TABLET BY MOUTH 2 TIMES DAILY. (Patient taking differently: Take 15 mg by mouth daily. 10 mg once a day) 180 tablet 1  . glipiZIDE (GLUCOTROL) 5 MG tablet Take 1 tablet (5 mg  total) by mouth daily before breakfast. 30 tablet 6  . glucose blood (ACCU-CHEK AVIVA PLUS) test strip USE AS INSTRUCTED DAILY. 50 each PRN  . lisinopril (ZESTRIL) 10 MG tablet TAKE 1 TABLET BY MOUTH DAILY. 90 tablet 2  . metFORMIN (GLUCOPHAGE-XR) 500 MG 24 hr tablet Take 3 tablets (1,500 mg total) by mouth daily with breakfast. 180 tablet 1  . omeprazole (PRILOSEC) 20 MG capsule Take 1 capsule (20 mg total) by mouth daily as needed. 90 capsule 1  . pravastatin (PRAVACHOL) 20 MG tablet Take 1qd with evening meal (Patient taking differently: Take 20 mg by mouth daily. Take 1qd with evening meal) 90 tablet 0   No current  facility-administered medications for this visit.   Allergies  Allergen Reactions  . Niacin     REACTION: unbearable hot flashes     Review of Systems: Positive for fatigue, grief, sleeping problems, excessive urination, urine leakage.  All other systems reviewed and negative except where noted in HPI.   Creatinine clearance cannot be calculated (Patient's most recent lab result is older than the maximum 21 days allowed.)   Physical Exam:    Wt Readings from Last 3 Encounters:  11/23/19 143 lb 4 oz (65 kg)  11/21/19 142 lb 4 oz (64.5 kg)  09/30/19 144 lb 12.8 oz (65.7 kg)    BP 124/74   Pulse 75   Temp 97.8 F (36.6 C)   Ht 5\' 4"  (1.626 m)   Wt 143 lb 4 oz (65 kg)   BMI 24.59 kg/m  Constitutional:  Pleasant female in no acute distress. Psychiatric: Normal mood and affect. Behavior is normal. EENT: Pupils normal.  Conjunctivae are normal. No scleral icterus. Neck supple.  Cardiovascular: Normal rate, regular rhythm. No edema Pulmonary/chest: Effort normal and breath sounds normal. No wheezing, rales or rhonchi. Abdominal: Soft, nondistended, nontender. Bowel sounds active throughout. There are no masses palpable. No hepatomegaly. Neurological: Alert and oriented to person place and time. Skin: Skin is warm and dry. No rashes noted.  Tye Savoy, NP  11/23/2019, 6:07 PM  Cc:  Referring Provider Dutch Quint, FNP

## 2019-11-24 ENCOUNTER — Encounter: Payer: Self-pay | Admitting: Nurse Practitioner

## 2019-11-25 ENCOUNTER — Other Ambulatory Visit: Payer: Self-pay | Admitting: Family

## 2019-11-25 DIAGNOSIS — M79622 Pain in left upper arm: Secondary | ICD-10-CM

## 2019-12-01 ENCOUNTER — Encounter: Payer: Medicare PPO | Admitting: Family Medicine

## 2019-12-02 ENCOUNTER — Encounter: Payer: Medicare PPO | Admitting: Family Medicine

## 2019-12-06 NOTE — Progress Notes (Signed)
Reviewed and agree with documentation and assessment and plan. K. Veena Nehemiah Montee , MD   

## 2019-12-08 ENCOUNTER — Other Ambulatory Visit: Payer: Self-pay | Admitting: Nurse Practitioner

## 2019-12-09 ENCOUNTER — Ambulatory Visit
Admission: RE | Admit: 2019-12-09 | Discharge: 2019-12-09 | Disposition: A | Discharge status: Home / Self Care | Payer: Medicare PPO | Source: Ambulatory Visit | Attending: Family | Admitting: Family

## 2019-12-09 ENCOUNTER — Other Ambulatory Visit: Payer: Self-pay

## 2019-12-09 DIAGNOSIS — M79622 Pain in left upper arm: Secondary | ICD-10-CM

## 2019-12-13 ENCOUNTER — Other Ambulatory Visit: Payer: Self-pay

## 2019-12-13 MED ORDER — OMEPRAZOLE 20 MG PO CPDR: 20.0000 mg | DELAYED_RELEASE_CAPSULE | Freq: Every day | ORAL | 1 refills | Status: DC | PRN

## 2019-12-27 ENCOUNTER — Ambulatory Visit: Payer: Medicare PPO | Admitting: Nurse Practitioner

## 2019-12-27 ENCOUNTER — Ambulatory Visit: Payer: Medicare PPO | Admitting: Internal Medicine

## 2019-12-27 ENCOUNTER — Other Ambulatory Visit: Payer: Self-pay

## 2019-12-27 ENCOUNTER — Encounter: Payer: Self-pay | Admitting: Internal Medicine

## 2019-12-27 VITALS — BP 124/74 | HR 78 | Temp 98.1°F | Ht 64.0 in | Wt 141.6 lb

## 2019-12-27 DIAGNOSIS — E1121 Type 2 diabetes mellitus with diabetic nephropathy: Secondary | ICD-10-CM

## 2019-12-27 DIAGNOSIS — E1165 Type 2 diabetes mellitus with hyperglycemia: Secondary | ICD-10-CM | POA: Diagnosis not present

## 2019-12-27 DIAGNOSIS — E1122 Type 2 diabetes mellitus with diabetic chronic kidney disease: Secondary | ICD-10-CM

## 2019-12-27 DIAGNOSIS — N1831 Chronic kidney disease, stage 3a: Secondary | ICD-10-CM

## 2019-12-27 LAB — GLUCOSE, POCT (MANUAL RESULT ENTRY): POC Glucose: 123 mg/dl — AB (ref 70–99)

## 2019-12-27 LAB — POCT GLYCOSYLATED HEMOGLOBIN (HGB A1C): Hemoglobin A1C: 8.1 % — AB (ref 4.0–5.6)

## 2019-12-27 MED ORDER — GLIPIZIDE 5 MG PO TABS: 5.0000 mg | ORAL_TABLET | Freq: Two times a day (BID) | ORAL | 1 refills | Status: DC

## 2019-12-27 NOTE — Progress Notes (Signed)
Name: Wanda [name]  Age/ Sex: 74 y.o., female   MRN/ DOB: [id number], Nov 12, 1945     PCP: Lucille Passy, MD   Reason for Endocrinology Evaluation: Type 2 Diabetes Mellitus  Initial Endocrine Consultative Visit: 10/03/2019    PATIENT IDENTIFIER: [name] is a 74 y.o. female with a past medical history of T2DM, dislipidemia and CKD. The patient has followed with Endocrinology clinic since 10/03/2019 for consultative assistance with management of her diabetes.  DIABETIC HISTORY:  [name] was diagnosed with DM at age 16. She has always been on oral glycemic agents. Her hemoglobin A1c has ranged from 7.1% in 2017, peaking at 8.3%in 2020.   On her initial visit to our clinic she had an A1c of 8.0% She was on Glipizide XL, Januvia and Metformin . We switched Glipizide XL to regular release due to risk of hypoglycemia. We stopped Januvia , started Rybelsus and continued Metformin    But pt could not tolerate Rybelsus due to nausea.  SUBJECTIVE:   During the last visit (10/03/2019): A1c 8.0 % We switched Glipizide XL to regular release due to risk of hypoglycemia. We stopped Januvia , started Rybelsus and continued Metformin    Today (12/27/2019): [name] is here for a follow up on diabetes management.  She checks her blood sugars occasionall. The patient has not had hypoglycemic episodes since the last clinic visit.   She has occasional nausea and alternating constipation with diarrhea.   HOME DIABETES REGIMEN:  Glipizide 5 mg  Daily  Metformin 500 mg, 3 tabs daily     Statin: yes ACE-I/ARB: yes    METER DOWNLOAD SUMMARY: Did not bring   DIABETIC COMPLICATIONS: Microvascular complications:  CKD III Denies: neuropathy  Last eye exam: Completed 08/2019  Macrovascular complications:   Denies: CAD, PVD, CVA    HISTORY:  Past Medical History:  Past Medical History:  Diagnosis Date  . Anxiety   . Colon polyps   . Depression   . DM (diabetes mellitus) ([place])   .  Female bladder prolapse   . Fibromyalgia    "neurofibromyalgia"  . Headache   . HTN (hypertension)   . Hyperlipidemia    Past Surgical History:  Past Surgical History:  Procedure Laterality Date  . COLONOSCOPY  03/28/2019   Dr Collene Mares  . WRIST SURGERY  1969   Social History:  reports that she has never smoked. She has never used smokeless tobacco. She reports that she does not drink alcohol or use drugs. Family History:  Family History  Problem Relation Age of Onset  . Heart attack Other   . Hypertension Mother   . Hyperlipidemia Mother   . Stroke Mother   . Diabetes Sister   . Parkinson's disease Brother   . Diabetes Sister   . Breast cancer Sister   . Colon cancer Neg Hx   . Esophageal cancer Neg Hx      HOME MEDICATIONS: Allergies as of 12/27/2019      Reactions   Niacin    REACTION: unbearable hot flashes      Medication List       Accurate as of Dec 27, 2019  4:21 PM. If you have any questions, ask your nurse or doctor.        busPIRone 15 MG tablet Commonly known as: BUSPAR TAKE 1 TABLET BY MOUTH 2 TIMES DAILY. What changed:  when to take this additional instructions   glipiZIDE 5 MG tablet Commonly known as: GLUCOTROL Take 1  tablet (5 mg total) by mouth 2 (two) times daily before a meal. What changed: when to take this Changed by: Dorita Sciara, MD   glucose blood test strip Commonly known as: Accu-Chek Aviva Plus USE AS INSTRUCTED DAILY.   lisinopril 10 MG tablet Commonly known as: ZESTRIL TAKE 1 TABLET BY MOUTH DAILY.   metFORMIN 500 MG 24 hr tablet Commonly known as: GLUCOPHAGE-XR Take 3 tablets (1,500 mg total) by mouth daily with breakfast.   omeprazole 20 MG capsule Commonly known as: PRILOSEC Take 1 capsule (20 mg total) by mouth daily as needed.   pravastatin 20 MG tablet Commonly known as: PRAVACHOL Take 1qd with evening meal What changed:  how much to take how to take this when to take this        OBJECTIVE:    Vital Signs: BP 124/74 (BP Location: Left Arm, Patient Position: Sitting, Cuff Size: Normal)   Pulse 78   Temp 98.1 F (36.7 C)   Ht 5\' 4"  (1.626 m)   Wt 141 lb 9.6 oz (64.2 kg)   SpO2 98%   BMI 24.31 kg/m   Wt Readings from Last 3 Encounters:  12/27/19 141 lb 9.6 oz (64.2 kg)  11/23/19 143 lb 4 oz (65 kg)  11/21/19 142 lb 4 oz (64.5 kg)     Exam: General: Pt appears well and is in NAD  Lungs: Clear with good BS bilat with no rales, rhonchi, or wheezes  Heart: RRR with normal S1 and S2 and no gallops; no murmurs; no rub  Abdomen: Normoactive bowel sounds, soft, nontender, without masses or organomegaly palpable  Extremities: No pretibial edema.   Skin: Normal texture and temperature to palpation.   Neuro: MS is good with appropriate affect, pt is alert and Ox3    DM foot exam:  12/27/2019  The skin of the feet is intact without sores or ulcerations. The pedal pulses are 2+ on right and 2+ on left. The sensation is intact to a screening 5.07, 10 gram monofilament bilaterally        DATA REVIEWED:  Lab Results  Component Value Date   HGBA1C 8.1 (A) 12/27/2019   HGBA1C 8.0 (H) 08/23/2019   HGBA1C 8.2 (H) 05/18/2019   Lab Results  Component Value Date   MICROALBUR 1.3 10/02/2014   LDLCALC 84 05/05/2017   CREATININE 0.96 08/23/2019   CREATININE 0.96 08/23/2019   Lab Results  Component Value Date   MICRALBCREAT 0.8 10/02/2014     Lab Results  Component Value Date   CHOL 139 08/23/2019   HDL 38.20 (L) 08/23/2019   LDLCALC 84 05/05/2017   LDLDIRECT 75.0 08/23/2019   TRIG 207.0 (H) 08/23/2019   CHOLHDL 4 08/23/2019         ASSESSMENT / PLAN / RECOMMENDATIONS:   1) Type 2 Diabetes Mellitus, poorly controlled, With CKDIII complications - Most recent A1c of 8.1 %. Goal A1c < 7.0%.    - A1c stable despite stopping januvia and reducing glipizide dose.  - She is intolerant to Rybelsus - She was given the option of going back on Januvia vs increasing  Glipizide dose and pt opted to increase Glipizide - She was again encouraged to check glucose at home for safety   MEDICATIONS: - Continue Metformin 500 mg , 3 tablet with breakfast  - Increase Glipizide 5 mg, 1 tablet  before Breakfast and Supper    EDUCATION / INSTRUCTIONS: BG monitoring instructions: Patient is instructed to check her blood sugars 2 times  a day, fasting and before supper. Call [facility] if: BG persistently < 70 or > 300. I reviewed the Rule of 15 for the treatment of hypoglycemia in detail with the patient. Literature supplied.    2) Diabetic complications:  Eye: Does not have known diabetic retinopathy.  Neuro/ Feet: Does not have known diabetic peripheral neuropathy .  Renal: Patient does  have known baseline CKD. She   is  on an ACEI/ARB at present.       F/U in 3 months    Signed electronically by: Mack Guise, MD  [facility]  [facility] [address] [place], [place] [address] Phone: [phone] FAX: [phone]   CC: Lucille Passy, MD No address on file Phone: None  Fax: None  Return to Endocrinology clinic as below: Future Appointments  Date Time Provider [place]  01/18/2020 11:00 AM Willia Craze, NP [facility]  03/30/2020 10:50 AM Raymund Manrique, Melanie Crazier, MD [facility] None

## 2019-12-27 NOTE — Patient Instructions (Addendum)
-   Continue Metformin 500 mg , 3 tablet with breakfast   - Increase Glipizide 5 mg, 1 tablet  before Breakfast and Supper     - Try and check your sugar before breakfast and before supper when you can      HOW TO TREAT LOW BLOOD SUGARS (Blood sugar LESS THAN 70 MG/DL)  Please follow the RULE OF 15 for the treatment of hypoglycemia treatment (when your (blood sugars are less than 70 mg/dL)    STEP 1: Take 15 grams of carbohydrates when your blood sugar is low, which includes:   3-4 GLUCOSE TABS  OR  3-4 OZ OF JUICE OR REGULAR SODA OR  ONE TUBE OF GLUCOSE GEL     STEP 2: RECHECK blood sugar in 15 MINUTES STEP 3: If your blood sugar is still low at the 15 minute recheck --> then, go back to STEP 1 and treat AGAIN with another 15 grams of carbohydrates.

## 2020-01-05 DIAGNOSIS — N8110 Cystocele, unspecified: Secondary | ICD-10-CM | POA: Diagnosis not present

## 2020-01-16 ENCOUNTER — Other Ambulatory Visit: Payer: Self-pay

## 2020-01-16 ENCOUNTER — Ambulatory Visit: Payer: Medicare PPO | Admitting: Family Medicine

## 2020-01-16 ENCOUNTER — Encounter: Payer: Self-pay | Admitting: Family Medicine

## 2020-01-16 VITALS — BP 118/78 | HR 86 | Ht 64.0 in | Wt 141.0 lb

## 2020-01-16 DIAGNOSIS — E785 Hyperlipidemia, unspecified: Secondary | ICD-10-CM

## 2020-01-16 DIAGNOSIS — F4321 Adjustment disorder with depressed mood: Secondary | ICD-10-CM | POA: Diagnosis not present

## 2020-01-16 DIAGNOSIS — K219 Gastro-esophageal reflux disease without esophagitis: Secondary | ICD-10-CM

## 2020-01-16 DIAGNOSIS — N1831 Chronic kidney disease, stage 3a: Secondary | ICD-10-CM | POA: Diagnosis not present

## 2020-01-16 DIAGNOSIS — I10 Essential (primary) hypertension: Secondary | ICD-10-CM

## 2020-01-16 DIAGNOSIS — E1121 Type 2 diabetes mellitus with diabetic nephropathy: Secondary | ICD-10-CM | POA: Diagnosis not present

## 2020-01-16 NOTE — Assessment & Plan Note (Signed)
BP controlled. Cont lisinopril

## 2020-01-16 NOTE — Patient Instructions (Addendum)
#  Medication - add an alarm to your phone to remind you talk medication - try to take your medicine every day - try to stop drinking soda  #Reflux - stop prilosec daily - if symptoms return consider trying Famotidine daily  - or can take prilosec (omeprazole)  Bone Health 1) 800 units of Vitamin D daily 2) Get 1200 mg of elemental calcium --- this is best from your diet. Try to track how much calcium you get on a typical day. You could find ways to add more (dairy products, leafy greens). Take a supplement for whatever you don't typically get so you reach 1200 mg of calcium.  3) Physical activity (ideally weight bearing) - like walking briskly 30 minutes 5 days a week.    #Mood - stop Buspar medication - if symptoms worsen or return - restart

## 2020-01-16 NOTE — Progress Notes (Signed)
Subjective:     Wanda [name] is a 74 y.o. female presenting for Establish Care     HPI   #DM - recently started seeing Dr. 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 - hard time remembering to take pills - not consistent with anything - schedule wise  #HTN - controlled  #GERD - taking prilosec daily - has not tried stopping  #adjustment disorder - was started on buspar when her husband and brother were dealing with health issues - in a better place  Review of Systems   Social History   Tobacco Use  Smoking Status Never Smoker  Smokeless Tobacco Never Used        Objective:    BP Readings from Last 3 Encounters:  01/16/20 118/78  12/27/19 124/74  11/23/19 124/74   Wt Readings from Last 3 Encounters:  01/16/20 141 lb (64 kg)  12/27/19 141 lb 9.6 oz (64.2 kg)  11/23/19 143 lb 4 oz (65 kg)    BP 118/78   Pulse 86   Ht 5\' 4"  (1.626 m)   Wt 141 lb (64 kg)   SpO2 97%   BMI 24.20 kg/m    Physical Exam Constitutional:      General: She is not in acute distress.    Appearance: She is well-developed. She is not diaphoretic.  HENT:     Right Ear: External ear normal.     Left Ear: External ear normal.  Eyes:     Conjunctiva/sclera: Conjunctivae normal.  Cardiovascular:     Rate and Rhythm: Normal rate and regular rhythm.     Heart sounds: No murmur heard.   Pulmonary:     Effort: Pulmonary effort is normal. No respiratory distress.     Breath sounds: Normal breath sounds. No wheezing.  Musculoskeletal:     Cervical back: Neck supple.  Skin:    General: Skin is warm and dry.     Capillary Refill: Capillary refill takes less than 2 seconds.  Neurological:     Mental Status: She is alert. Mental status is at baseline.  Psychiatric:        Mood and Affect: Mood normal.        Behavior:  Behavior normal.           Assessment & Plan:   Problem List Items Addressed This Visit      Cardiovascular and Mediastinum   Essential hypertension    BP controlled. Cont lisinopril        Digestive   GERD (gastroesophageal reflux disease)    Discussed trial of stopping PPI and taking H2 blocker or nothing. Discussed impact on bone health and reviewed recommendations for Ca and Vit D.         Endocrine   Type 2 diabetes mellitus with stage 3a chronic kidney disease, without long-term current use of insulin ([place]) - Primary    Following with endocrinology but notes difficulty with adherence to taking daily medication. Discussed importance of diabetes medication on overall heart health. She is going to set an alarm on her phone. Appreciate endo support. Cont metformin and glipizide. Cont low sugar diet.         Other   Adjustment disorder with depressed mood    Symptoms improved. Trial stopping buspar. Return prn      HLD (hyperlipidemia)  Last LDL 75. Discussed goal of 70. Will repeat next year and if still high will likely change medication, however, encouraged adherence today.           Return in about 6 months (around 07/17/2020).  Lesleigh Noe, MD

## 2020-01-16 NOTE — Assessment & Plan Note (Signed)
Discussed trial of stopping PPI and taking H2 blocker or nothing. Discussed impact on bone health and reviewed recommendations for Ca and Vit D.

## 2020-01-16 NOTE — Assessment & Plan Note (Signed)
Following with endocrinology but notes difficulty with adherence to taking daily medication. Discussed importance of diabetes medication on overall heart health. She is going to set an alarm on her phone. Appreciate endo support. Cont metformin and glipizide. Cont low sugar diet.

## 2020-01-16 NOTE — Assessment & Plan Note (Signed)
Symptoms improved. Trial stopping buspar. Return prn

## 2020-01-16 NOTE — Assessment & Plan Note (Signed)
Last LDL 75. Discussed goal of 70. Will repeat next year and if still high will likely change medication, however, encouraged adherence today.

## 2020-01-18 ENCOUNTER — Encounter: Payer: Self-pay | Admitting: Nurse Practitioner

## 2020-01-18 ENCOUNTER — Ambulatory Visit: Payer: Medicare PPO | Admitting: Nurse Practitioner

## 2020-01-18 VITALS — BP 142/84 | HR 85 | Ht 64.0 in | Wt 139.0 lb

## 2020-01-18 DIAGNOSIS — Z86010 Personal history of colonic polyps: Secondary | ICD-10-CM

## 2020-01-18 DIAGNOSIS — R194 Change in bowel habit: Secondary | ICD-10-CM | POA: Diagnosis not present

## 2020-01-18 NOTE — Progress Notes (Signed)
.   pylor

## 2020-01-18 NOTE — Progress Notes (Signed)
IMPRESSION and PLAN:    74 year old female with PMH significant for DM2, headaches, hypertension, hyperlipidemia, fibromyalgia, anxiety, depression, colon polyps  # Bowel changes --Bowel habits have actually improved since I saw her late April.  Patient thinks retirement and/or reduction of food portions have contributed.  --She still needs to increase her fluid intake to 64 ounces of water daily. --Since prone to constipation I still recommend a fiber supplement.  She plans to pick up the wafers. --Call if recurrent bowel problems  # Colon cancer screening --Received and reviewed last colonoscopy from [facility] performed August 2020.  No cancer was found.  A couple small polyps were removed.  No repeat colonoscopy recommended due to age.  However, I did not receive the polyp pathology report.  I will rerequest this so we can make a decision about whether follow-up colonoscopy as needed  HPI:    Primary GI: Harl Bowie, MD   Chief complaint : follow up on bowel changes  I saw patient as a new patient to the practice 11/23/2019 for evaluation of altered bowel habits.  Overall patient was felt to be constipated.  The plan was to increase fluid intake, start fiber , get colonoscopy report from previous GI, Dr. Collene Mares and follow-up with me in 4 weeks.  INTERVAL HISTORY:  Patient has been prone to constipation for years. She could sometimes go a week without a BM but stools not usually hard. Tends to get constipated when on vacation. She feels like water intake is still inadequate. She hasn't started fiber but thought about getting some fiber wafers. In April she started having loose stools ( I saw her around that time) with significant urgency but lately bowel movements have improved. Having a formed bowel movement every few days now. Patient thinks bowel habits started to improve as retirement drew near. Also she has tried to reduce food portions at mealtime but hasn't changed  her diet. Right now she is please with bowel habits.   Previous Endoscopic Evaluation:  Colonoscopy 03/28/2019 - Dr. Collene Mares --Done for personal history of colon polyps. Polyp pathology was not sent with records --Exam was complete, quality of the bowel preparation was adequate. --Findings included a 7 mm sessile proximal ascending colon polyp.  A small sessile polyp in the cecum.  Normal terminal ileum.  Exam otherwise normal. --Repeat colonoscopy was NOT recommended due to patient's age.    Review of systems:     No chest pain, no SOB, no fevers, no urinary sx   Past Medical History:  Diagnosis Date  . Anxiety   . Colon polyps   . Depression   . DM (diabetes mellitus) ([place])   . Female bladder prolapse   . Fibromyalgia    "neurofibromyalgia"  . Headache   . HTN (hypertension)   . Hyperlipidemia     Patient's surgical history, family medical history, social history, medications and allergies were all reviewed in Epic   Creatinine clearance cannot be calculated (Patient's most recent lab result is older than the maximum 21 days allowed.)  Current Outpatient Medications  Medication Sig Dispense Refill  . glipiZIDE (GLUCOTROL) 5 MG tablet Take 1 tablet (5 mg total) by mouth 2 (two) times daily before a meal. 180 tablet 1  . glucose blood (ACCU-CHEK AVIVA PLUS) test strip USE AS INSTRUCTED DAILY. 50 each PRN  . lisinopril (ZESTRIL) 10 MG tablet TAKE 1 TABLET BY MOUTH DAILY. 90 tablet 2  . metFORMIN (  GLUCOPHAGE-XR) 500 MG 24 hr tablet Take 3 tablets (1,500 mg total) by mouth daily with breakfast. 180 tablet 1  . pravastatin (PRAVACHOL) 20 MG tablet Take 1qd with evening meal (Patient taking differently: Take 20 mg by mouth daily. Take 1qd with evening meal) 90 tablet 0   No current facility-administered medications for this visit.    Physical Exam:     BP (!) 142/84   Pulse 85   Ht 5\' 4"  (1.626 m)   Wt 139 lb (63 kg)   BMI 23.86 kg/m   GENERAL:  Pleasant female in  NAD PSYCH: : Cooperative, normal affect CARDIAC:  RRR PULM: Normal respiratory effort, lungs CTA bilaterally, no wheezing ABDOMEN:  Nondistended, soft, nontender. No obvious masses, no hepatomegaly,  normal bowel sounds SKIN:  turgor, no lesions seen Musculoskeletal:  Normal muscle tone, normal strength NEURO: Alert and oriented x 3, no focal neurologic deficits   Tye Savoy , NP 01/18/2020, 11:18 AM

## 2020-01-18 NOTE — Patient Instructions (Signed)
If you are age 72 or older, your body mass index should be between 23-30. Your Body mass index is 23.86 kg/m. If this is out of the aforementioned range listed, please consider follow up with your Primary Care Provider.  If you are age 19 or younger, your body mass index should be between 19-25. Your Body mass index is 23.86 kg/m. If this is out of the aformentioned range listed, please consider follow up with your Primary Care Provider.   Call the office if you experience recurrent bowel changes.  Follow up as needed.

## 2020-01-24 ENCOUNTER — Telehealth: Payer: Self-pay | Admitting: Nurse Practitioner

## 2020-01-24 NOTE — Telephone Encounter (Signed)
Deborah from [facility] called to inform that they do not have any path reports to send

## 2020-02-01 ENCOUNTER — Telehealth: Payer: Self-pay | Admitting: Internal Medicine

## 2020-02-01 NOTE — Telephone Encounter (Signed)
Medication RX Request  Name of medication? aviva accu-chek pen needles and strips   Is this a 90 day supply? yes  Name and location of pharmacy?  [place], [place] Phone:  [phone]  Fax:  [phone]       . Is the request for diabetes test strips? yes . If yes, what brand? accu-chek aviva

## 2020-02-01 NOTE — Telephone Encounter (Signed)
Wanda [name], please let patient know that there were no path reports available from her colonoscopy at [facility].  Polyps were removed but I do not know what happened to path and we double checked with [facility] to be sure that there were no reports available.  The polyps were small.  I will let Dr. Silverio Decamp know that we have no path report.  I will see which she says about whether patient will ever need recall colonoscopy or not.

## 2020-02-01 NOTE — Telephone Encounter (Signed)
Wanda [name], looks like she had procedure with Dr Collene Mares, not [facility]. Please send request to her office to obtain path report. She will likely need recall colonoscopy in 2025 if still no path due to history of polyps.Thanks

## 2020-02-02 ENCOUNTER — Other Ambulatory Visit: Payer: Self-pay

## 2020-02-02 MED ORDER — ACCU-CHEK AVIVA PLUS VI STRP: ORAL_STRIP | 11 refills | Status: DC

## 2020-02-02 NOTE — Telephone Encounter (Signed)
Confirmed the pathology report is at Dr [facility] office. Called the patient. No answer. Left a message to come by our office to sign a release of medical information form. We can request the report once she is able to do this. Records release form at the front desk for the patient.

## 2020-02-02 NOTE — Progress Notes (Signed)
Reviewed and agree with documentation and assessment and plan. K. Veena Latanja Lehenbauer , MD   

## 2020-02-03 NOTE — Telephone Encounter (Signed)
Thanks Wanda [name],  They already got a record release form, that is how we got her colonoscopy. They just didn't send the path.  I was mistaken about having it done with [facility] as it was Dr. Collene Mares. Thanks

## 2020-02-07 NOTE — Telephone Encounter (Signed)
I called Dr Lorie Apley office. They did not see the release of records. I cannot get the pathology report.   Estill Bamberg, do you still have a copy of the release of records?

## 2020-02-07 NOTE — Telephone Encounter (Signed)
It looks like it should be attached to the records that came in. The original request has been pulled from drawer since it has came back.

## 2020-02-08 DIAGNOSIS — N3946 Mixed incontinence: Secondary | ICD-10-CM | POA: Diagnosis not present

## 2020-02-08 DIAGNOSIS — N8110 Cystocele, unspecified: Secondary | ICD-10-CM | POA: Diagnosis not present

## 2020-02-08 DIAGNOSIS — R82998 Other abnormal findings in urine: Secondary | ICD-10-CM | POA: Diagnosis not present

## 2020-02-08 DIAGNOSIS — R159 Full incontinence of feces: Secondary | ICD-10-CM | POA: Diagnosis not present

## 2020-02-20 DIAGNOSIS — N393 Stress incontinence (female) (male): Secondary | ICD-10-CM | POA: Diagnosis not present

## 2020-02-20 DIAGNOSIS — E119 Type 2 diabetes mellitus without complications: Secondary | ICD-10-CM | POA: Diagnosis not present

## 2020-02-20 DIAGNOSIS — N812 Incomplete uterovaginal prolapse: Secondary | ICD-10-CM | POA: Diagnosis not present

## 2020-02-20 DIAGNOSIS — N813 Complete uterovaginal prolapse: Secondary | ICD-10-CM | POA: Diagnosis not present

## 2020-02-20 DIAGNOSIS — Z7984 Long term (current) use of oral hypoglycemic drugs: Secondary | ICD-10-CM | POA: Diagnosis not present

## 2020-02-21 ENCOUNTER — Other Ambulatory Visit: Payer: Self-pay | Admitting: Family Medicine

## 2020-02-21 NOTE — Telephone Encounter (Signed)
Please see message and advise.  Thank you. ° °

## 2020-03-07 DIAGNOSIS — R82998 Other abnormal findings in urine: Secondary | ICD-10-CM | POA: Diagnosis not present

## 2020-03-07 DIAGNOSIS — R102 Pelvic and perineal pain: Secondary | ICD-10-CM | POA: Diagnosis not present

## 2020-03-07 DIAGNOSIS — N398 Other specified disorders of urinary system: Secondary | ICD-10-CM | POA: Diagnosis not present

## 2020-03-07 DIAGNOSIS — R339 Retention of urine, unspecified: Secondary | ICD-10-CM | POA: Diagnosis not present

## 2020-03-28 DIAGNOSIS — R339 Retention of urine, unspecified: Secondary | ICD-10-CM | POA: Diagnosis not present

## 2020-03-28 DIAGNOSIS — N3946 Mixed incontinence: Secondary | ICD-10-CM | POA: Diagnosis not present

## 2020-03-28 DIAGNOSIS — Z09 Encounter for follow-up examination after completed treatment for conditions other than malignant neoplasm: Secondary | ICD-10-CM | POA: Diagnosis not present

## 2020-03-30 ENCOUNTER — Ambulatory Visit: Payer: Medicare PPO | Admitting: Internal Medicine

## 2020-05-09 ENCOUNTER — Other Ambulatory Visit: Payer: Self-pay | Admitting: Family Medicine

## 2020-05-09 ENCOUNTER — Other Ambulatory Visit: Payer: Self-pay | Admitting: Family

## 2020-05-09 DIAGNOSIS — Z1231 Encounter for screening mammogram for malignant neoplasm of breast: Secondary | ICD-10-CM

## 2020-06-12 ENCOUNTER — Ambulatory Visit
Admission: RE | Admit: 2020-06-12 | Discharge: 2020-06-12 | Disposition: A | Discharge status: Home / Self Care | Payer: Medicare PPO | Source: Ambulatory Visit | Attending: Family Medicine | Admitting: Family Medicine

## 2020-06-12 ENCOUNTER — Other Ambulatory Visit: Payer: Self-pay

## 2020-06-12 DIAGNOSIS — Z1231 Encounter for screening mammogram for malignant neoplasm of breast: Secondary | ICD-10-CM

## 2020-07-04 ENCOUNTER — Telehealth: Payer: Self-pay | Admitting: Family Medicine

## 2020-07-04 DIAGNOSIS — I10 Essential (primary) hypertension: Secondary | ICD-10-CM

## 2020-07-04 DIAGNOSIS — E1165 Type 2 diabetes mellitus with hyperglycemia: Secondary | ICD-10-CM

## 2020-07-04 DIAGNOSIS — E785 Hyperlipidemia, unspecified: Secondary | ICD-10-CM

## 2020-07-04 DIAGNOSIS — N1831 Chronic kidney disease, stage 3a: Secondary | ICD-10-CM

## 2020-07-04 NOTE — Telephone Encounter (Signed)
Labs ordered. Please have pt schedule lab appointment.

## 2020-07-04 NOTE — Addendum Note (Signed)
Addended by: [name] R on: 07/04/2020 12:22 PM   Modules accepted: Orders

## 2020-07-04 NOTE — Telephone Encounter (Signed)
Patient called in requesting bloodwork prior to her visit on Monday. She would like to check her A1C, Cholestrol , All labs for her diabetes. Please give her a call back on her cell phone.

## 2020-07-05 NOTE — Telephone Encounter (Signed)
Called patient and lvm to call back and schedule labs.

## 2020-07-06 ENCOUNTER — Other Ambulatory Visit (INDEPENDENT_AMBULATORY_CARE_PROVIDER_SITE_OTHER): Payer: Medicare PPO

## 2020-07-06 ENCOUNTER — Other Ambulatory Visit: Payer: Self-pay

## 2020-07-06 DIAGNOSIS — N1831 Chronic kidney disease, stage 3a: Secondary | ICD-10-CM

## 2020-07-06 DIAGNOSIS — E1122 Type 2 diabetes mellitus with diabetic chronic kidney disease: Secondary | ICD-10-CM | POA: Diagnosis not present

## 2020-07-06 DIAGNOSIS — E1165 Type 2 diabetes mellitus with hyperglycemia: Secondary | ICD-10-CM | POA: Diagnosis not present

## 2020-07-06 DIAGNOSIS — E785 Hyperlipidemia, unspecified: Secondary | ICD-10-CM

## 2020-07-06 DIAGNOSIS — I10 Essential (primary) hypertension: Secondary | ICD-10-CM

## 2020-07-06 LAB — CBC
HCT: 38.8 % (ref 36.0–46.0)
Hemoglobin: 12.8 g/dL (ref 12.0–15.0)
MCHC: 32.9 g/dL (ref 30.0–36.0)
MCV: 89.9 fl (ref 78.0–100.0)
Platelets: 291 10*3/uL (ref 150.0–400.0)
RBC: 4.31 Mil/uL (ref 3.87–5.11)
RDW: 13.2 % (ref 11.5–15.5)
WBC: 7.8 10*3/uL (ref 4.0–10.5)

## 2020-07-06 LAB — COMPREHENSIVE METABOLIC PANEL
ALT: 34 U/L (ref 0–35)
AST: 32 U/L (ref 0–37)
Albumin: 4.2 g/dL (ref 3.5–5.2)
Alkaline Phosphatase: 90 U/L (ref 39–117)
BUN: 15 mg/dL (ref 6–23)
CO2: 29 mEq/L (ref 19–32)
Calcium: 9.9 mg/dL (ref 8.4–10.5)
Chloride: 106 mEq/L (ref 96–112)
Creatinine, Ser: 0.93 mg/dL (ref 0.40–1.20)
GFR: 61.34 mL/min (ref >60.00)
Glucose, Bld: 176 mg/dL — ABNORMAL HIGH (ref 70–99)
Potassium: 4.7 mEq/L (ref 3.5–5.1)
Sodium: 143 mEq/L (ref 135–145)
Total Bilirubin: 0.4 mg/dL (ref 0.2–1.2)
Total Protein: 7.4 g/dL (ref 6.0–8.3)

## 2020-07-06 LAB — LIPID PANEL
Cholesterol: 181 mg/dL (ref 0–200)
HDL: 39.3 mg/dL (ref >39.00)
NonHDL: 141.72
Total CHOL/HDL Ratio: 5
Triglycerides: 279 mg/dL — ABNORMAL HIGH (ref 0.0–149.0)
VLDL: 55.8 mg/dL — ABNORMAL HIGH (ref 0.0–40.0)

## 2020-07-06 LAB — HEMOGLOBIN A1C: Hgb A1c MFr Bld: 9.3 % — ABNORMAL HIGH (ref 4.6–6.5)

## 2020-07-06 LAB — LDL CHOLESTEROL, DIRECT: Direct LDL: 96 mg/dL

## 2020-07-06 NOTE — Telephone Encounter (Signed)
Viewed schedule and patient scheduled for labs 12/6.

## 2020-07-09 ENCOUNTER — Other Ambulatory Visit: Payer: Self-pay

## 2020-07-09 ENCOUNTER — Ambulatory Visit: Payer: Medicare PPO | Admitting: Family Medicine

## 2020-07-09 VITALS — BP 110/70 | HR 83 | Temp 97.6°F | Wt 143.5 lb

## 2020-07-09 DIAGNOSIS — H9313 Tinnitus, bilateral: Secondary | ICD-10-CM

## 2020-07-09 DIAGNOSIS — N1832 Chronic kidney disease, stage 3b: Secondary | ICD-10-CM | POA: Diagnosis not present

## 2020-07-09 DIAGNOSIS — E1165 Type 2 diabetes mellitus with hyperglycemia: Secondary | ICD-10-CM | POA: Diagnosis not present

## 2020-07-09 DIAGNOSIS — R0602 Shortness of breath: Secondary | ICD-10-CM | POA: Insufficient documentation

## 2020-07-09 DIAGNOSIS — E1169 Type 2 diabetes mellitus with other specified complication: Secondary | ICD-10-CM | POA: Diagnosis not present

## 2020-07-09 DIAGNOSIS — I10 Essential (primary) hypertension: Secondary | ICD-10-CM

## 2020-07-09 DIAGNOSIS — E785 Hyperlipidemia, unspecified: Secondary | ICD-10-CM | POA: Diagnosis not present

## 2020-07-09 MED ORDER — METFORMIN HCL ER 500 MG PO TB24
2000.0000 mg | ORAL_TABLET | Freq: Every day | ORAL | 1 refills | Status: DC
Start: 2020-07-09 — End: 2021-04-09

## 2020-07-09 MED ORDER — SITAGLIPTIN PHOSPHATE 100 MG PO TABS: 100.0000 mg | ORAL_TABLET | Freq: Every day | ORAL | 3 refills | Status: DC

## 2020-07-09 NOTE — Patient Instructions (Addendum)
Increase meformin to 2000 mg daily  If tolerating after 1 week restart Januvia    Try to work on diet and exercise  Keep a lot of the breathing symptoms and the arm pain

## 2020-07-09 NOTE — Assessment & Plan Note (Signed)
Some intermittent SOB w/o chest pain. Discussed keeping a better log of symptoms. Work on exercise tolerance and if not improved cardiology referral for further evaluation

## 2020-07-09 NOTE — Assessment & Plan Note (Signed)
Improved on most recent labs. Cont lisinopril

## 2020-07-09 NOTE — Progress Notes (Signed)
Subjective:     Wanda [name] is a 74 y.o. female presenting for Follow-up (6 month )     HPI   #Diabetes Currently taking metformin 1500 mg and glipizide 5 mg BID  Using medications without difficulties: will be fine but then have diarrhea episodes for 24-36 hours Hypoglycemic episodes:does not check Hyperglycemic episodes:does not check Blood Sugars averaging: does not check Last HgbA1c:  Lab Results  Component Value Date   HGBA1C 9.3 (H) 07/06/2020    Eating habits have impacted things - has been eating [employer] and sweets in the evening  Not currently exercising - signed up for water aerobics and quit her job   Diabetes Health Maintenance Due:    Diabetes Health Maintenance Due  Topic Date Due  . OPHTHALMOLOGY EXAM  08/25/2020  . FOOT EXAM  12/26/2020  . HEMOGLOBIN A1C  01/04/2021   #diarrhea/constipation - was planning to see a GI specialist  - will get episodes of diarrhea every 2-3 months   Is getting DOE and has been more aware of breathing recently No cp Will left arm pain with activity Is more tired - still dealing with grief from 2019  Also having ringing in the ears and diminished hearing - bilateral ears - will hear a thump  - possible pulsating  Review of Systems   Social History   Tobacco Use  Smoking Status Never Smoker  Smokeless Tobacco Never Used        Objective:    BP Readings from Last 3 Encounters:  07/09/20 110/70  01/18/20 (!) 142/84  01/16/20 118/78   Wt Readings from Last 3 Encounters:  07/09/20 143 lb 8 oz (65.1 kg)  01/18/20 139 lb (63 kg)  01/16/20 141 lb (64 kg)    BP 110/70   Pulse 83   Temp 97.6 F (36.4 C) (Temporal)   Wt 143 lb 8 oz (65.1 kg)   SpO2 97%   BMI 24.63 kg/m    Physical Exam Constitutional:      General: She is not in acute distress.    Appearance: She is well-developed. She is not diaphoretic.  HENT:     Right Ear: External ear normal. There is impacted cerumen.     Left Ear:  External ear normal. There is impacted cerumen.     Nose: Nose normal.  Eyes:     Conjunctiva/sclera: Conjunctivae normal.  Cardiovascular:     Rate and Rhythm: Normal rate and regular rhythm.     Heart sounds: No murmur heard.   Pulmonary:     Effort: Pulmonary effort is normal. No respiratory distress.     Breath sounds: Normal breath sounds. No wheezing or rales.  Musculoskeletal:     Cervical back: Neck supple.     Right lower leg: No edema.     Left lower leg: No edema.  Skin:    General: Skin is warm and dry.     Capillary Refill: Capillary refill takes less than 2 seconds.  Neurological:     Mental Status: She is alert. Mental status is at baseline.  Psychiatric:        Mood and Affect: Mood normal.        Behavior: Behavior normal.      Hearing Screening   125Hz  250Hz  500Hz  1000Hz  2000Hz  3000Hz  4000Hz  6000Hz  8000Hz   Right ear:  20 20 20 20  20     Left ear:  20 20 20 20  20        Lab Results  Component Value Date   CHOL 181 07/06/2020   HDL 39.30 07/06/2020   LDLCALC 84 05/05/2017   LDLDIRECT 96.0 07/06/2020   TRIG 279.0 (H) 07/06/2020   CHOLHDL 5 07/06/2020       Assessment & Plan:   Problem List Items Addressed This Visit      Cardiovascular and Mediastinum   Essential hypertension    BP at goal. Cont lisinopril 10 mg      Relevant Medications   aspirin 81 MG chewable tablet     Endocrine   Hyperlipidemia associated with type 2 diabetes mellitus ([facility])    LDL not at goal. Will focus on diabetes control and cont pravastatin 20 mg. If still elevated, anticipate that we will increase medication.       Relevant Medications   aspirin 81 MG chewable tablet   metFORMIN (GLUCOPHAGE-XR) 500 MG 24 hr tablet   sitaGLIPtin (JANUVIA) 100 MG tablet   Type 2 diabetes mellitus with hyperglycemia, without long-term current use of insulin ([place]) - Primary    Diabetes is worse in the setting of stopping 3rd agent and poor diet/exercise. Encouraged dietary  adherence. Increase metformin 1500>2000 mg daily and restart Januvia. She will also start exercising.       Relevant Medications   aspirin 81 MG chewable tablet   metFORMIN (GLUCOPHAGE-XR) 500 MG 24 hr tablet   sitaGLIPtin (JANUVIA) 100 MG tablet     Genitourinary   CKD (chronic kidney disease) stage 3, GFR 30-59 ml/min (HCC)    Improved on most recent labs. Cont lisinopril        Other   Shortness of breath    Some intermittent SOB w/o chest pain. Discussed keeping a better log of symptoms. Work on exercise tolerance and if not improved cardiology referral for further evaluation      Tinnitus of both ears    Hearing normal today. Will continue to monitor          Return in about 3 months (around 10/07/2020).  Lesleigh Noe, MD  This visit occurred during the SARS-CoV-2 public health emergency.  Safety protocols were in place, including screening questions prior to the visit, additional usage of staff PPE, and extensive cleaning of exam room while observing appropriate contact time as indicated for disinfecting solutions.

## 2020-07-09 NOTE — Assessment & Plan Note (Signed)
LDL not at goal. Will focus on diabetes control and cont pravastatin 20 mg. If still elevated, anticipate that we will increase medication.

## 2020-07-09 NOTE — Assessment & Plan Note (Signed)
Diabetes is worse in the setting of stopping 3rd agent and poor diet/exercise. Encouraged dietary adherence. Increase metformin 1500>2000 mg daily and restart Januvia. She will also start exercising.

## 2020-07-09 NOTE — Assessment & Plan Note (Signed)
BP at goal. Cont lisinopril 10 mg 

## 2020-07-09 NOTE — Assessment & Plan Note (Signed)
Hearing normal today. Will continue to monitor

## 2020-07-14 DIAGNOSIS — Z20822 Contact with and (suspected) exposure to covid-19: Secondary | ICD-10-CM | POA: Diagnosis not present

## 2020-07-17 ENCOUNTER — Ambulatory Visit: Payer: Medicare PPO | Admitting: Family Medicine

## 2020-07-30 ENCOUNTER — Telehealth: Payer: Self-pay | Admitting: Family Medicine

## 2020-07-30 ENCOUNTER — Telehealth: Payer: Self-pay

## 2020-07-30 NOTE — Telephone Encounter (Signed)
FYI  Pt lmovm requesting advice in reference to her cough and watery eyes she is having... pt is going out of town and declines virtual visit... Pt advised to try OTC Coricidin being that she has high BP, also Delsym and a few days of regular Mucinex some chest congestion along with allergy medicine to help with watery eyes... pt advised of ED or UC precautions.Marland KitchenMarland KitchenMarland Kitchen

## 2020-07-30 NOTE — Telephone Encounter (Signed)
Agree and appreciate recommendations.  

## 2020-08-20 ENCOUNTER — Ambulatory Visit: Payer: Medicare PPO | Admitting: Family Medicine

## 2020-08-21 ENCOUNTER — Telehealth: Payer: Self-pay

## 2020-08-21 ENCOUNTER — Other Ambulatory Visit: Payer: Self-pay

## 2020-08-21 ENCOUNTER — Ambulatory Visit: Payer: Medicare PPO | Admitting: Family Medicine

## 2020-08-21 ENCOUNTER — Ambulatory Visit (INDEPENDENT_AMBULATORY_CARE_PROVIDER_SITE_OTHER)
Admission: RE | Admit: 2020-08-21 | Discharge: 2020-08-21 | Disposition: A | Discharge status: Home / Self Care | Payer: Medicare PPO | Source: Ambulatory Visit | Attending: Family Medicine | Admitting: Family Medicine

## 2020-08-21 ENCOUNTER — Encounter: Payer: Self-pay | Admitting: Family Medicine

## 2020-08-21 VITALS — BP 124/78 | HR 88 | Temp 97.5°F | Wt 140.8 lb

## 2020-08-21 DIAGNOSIS — M79645 Pain in left finger(s): Secondary | ICD-10-CM | POA: Insufficient documentation

## 2020-08-21 DIAGNOSIS — M19042 Primary osteoarthritis, left hand: Secondary | ICD-10-CM | POA: Diagnosis not present

## 2020-08-21 NOTE — Telephone Encounter (Signed)
Wanda [name] - Client Nonclinical Telephone Record AccessNurse Client Wanda [name] - Client Client Site [place] Physician [name]- MD Contact Type Call Who Is Calling Patient / Member / Family / Caregiver Caller Name Wanda [name] Caller Phone Number [phone] Call Type Message Only Information Provided Reason for Call Returning a Call from the Office Initial [place] states missed a call from the office. Additional Comment Provided office hours. Disp. Time Disposition Final User 08/20/2020 4:39:51 PM General Information Provided Yes [name], Wanda Billet Call Closed By: Wanda [name] Transaction Date/Time: 08/20/2020 4:37:46 PM (ET)

## 2020-08-21 NOTE — Progress Notes (Signed)
Subjective:     Wanda [name] is a 75 y.o. female presenting for Finger Injury (Pain and swelling x 4 weeks. No cause that she knows of. )     HPI   #left thumb pain - at rest has a heaviness - difficulty bending the finger - worse when she has mild trauma - finger will get "stuck" if placed in the bent position - started 4 weeks ago w/o any known injury - slightly swollen - not worse/better - no redness - pain only when hit   Review of Systems   Social History   Tobacco Use  Smoking Status Never Smoker  Smokeless Tobacco Never Used        Objective:    BP Readings from Last 3 Encounters:  08/21/20 124/78  07/09/20 110/70  01/18/20 (!) 142/84   Wt Readings from Last 3 Encounters:  08/21/20 140 lb 12 oz (63.8 kg)  07/09/20 143 lb 8 oz (65.1 kg)  01/18/20 139 lb (63 kg)    BP 124/78   Pulse 88   Temp (!) 97.5 F (36.4 C) (Temporal)   Wt 140 lb 12 oz (63.8 kg)   SpO2 97%   BMI 24.16 kg/m    Physical Exam Constitutional:      General: She is not in acute distress.    Appearance: She is well-developed. She is not diaphoretic.  HENT:     Right Ear: External ear normal.     Left Ear: External ear normal.     Nose: Nose normal.  Eyes:     Conjunctiva/sclera: Conjunctivae normal.  Cardiovascular:     Rate and Rhythm: Normal rate.  Pulmonary:     Effort: Pulmonary effort is normal.  Musculoskeletal:     Cervical back: Neck supple.     Comments: Left hand Inspection: no erythema, slight swelling of the first digit Palpation: TTP along the MCP joint palmer side ROM: difficulty with extension and flexion and opposition of the first digit at the MCP joint Strength: Normal finger strength  Skin:    General: Skin is warm and dry.     Capillary Refill: Capillary refill takes less than 2 seconds.  Neurological:     Mental Status: She is alert. Mental status is at baseline.  Psychiatric:        Mood and Affect: Mood normal.        Behavior: Behavior  normal.     DG Finger Thumb Left CLINICAL DATA:  Left thumb pain at the MCP joint.  No known injury.  EXAM: LEFT THUMB 2+V  COMPARISON:  None.  FINDINGS: There is no evidence of fracture or dislocation. Mild osteophytosis is seen about the first [facility] joint. The first MCP joint and IP joint of the thumb appear normal. Soft tissues are unremarkable.  IMPRESSION: Mild first CMC osteoarthritis.  Otherwise negative.  Electronically Signed   By: Inge Rise M.D.   On: 08/21/2020 13:32       Assessment & Plan:   Problem List Items Addressed This Visit      Other   Thumb pain, left - Primary    XR without acute finding. Though some signs of arthritis. She notes some trigger finger hx though unable to illicit on exam today. Though pain with motion. Advised brace and NSAIDs. If no improvement or more trigger symptoms advised f/u with sports medicine.       Relevant Orders   DG Finger Thumb Left (Completed)  Return if symptoms worsen or fail to improve.  Lesleigh Noe, MD  This visit occurred during the SARS-CoV-2 public health emergency.  Safety protocols were in place, including screening questions prior to the visit, additional usage of staff PPE, and extensive cleaning of exam room while observing appropriate contact time as indicated for disinfecting solutions.

## 2020-08-21 NOTE — Patient Instructions (Signed)
X-Ray did show a bone spur. I will follow-up the final read  I would recommend you try getting a thumb brace to support the thumb and take tylenol or ibuprofen as needed for pain  If pain is not improving or your thumb gets stuck several times -- make a follow-up appointment to see Dr. Lorelei Pont

## 2020-08-21 NOTE — Telephone Encounter (Signed)
See chart review tab; pt had visit with DR Einar Pheasant 08/21/20.

## 2020-08-21 NOTE — Assessment & Plan Note (Signed)
XR without acute finding. Though some signs of arthritis. She notes some trigger finger hx though unable to illicit on exam today. Though pain with motion. Advised brace and NSAIDs. If no improvement or more trigger symptoms advised f/u with sports medicine.

## 2020-08-25 DIAGNOSIS — Z20822 Contact with and (suspected) exposure to covid-19: Secondary | ICD-10-CM | POA: Diagnosis not present

## 2020-10-08 ENCOUNTER — Encounter: Payer: Self-pay | Admitting: Family Medicine

## 2020-10-08 ENCOUNTER — Ambulatory Visit: Payer: Medicare PPO | Admitting: Family Medicine

## 2020-10-08 ENCOUNTER — Other Ambulatory Visit: Payer: Self-pay

## 2020-10-08 VITALS — BP 100/70 | HR 77 | Temp 97.1°F | Ht 64.0 in | Wt 140.8 lb

## 2020-10-08 DIAGNOSIS — E1165 Type 2 diabetes mellitus with hyperglycemia: Secondary | ICD-10-CM | POA: Diagnosis not present

## 2020-10-08 DIAGNOSIS — I10 Essential (primary) hypertension: Secondary | ICD-10-CM | POA: Diagnosis not present

## 2020-10-08 LAB — POCT GLYCOSYLATED HEMOGLOBIN (HGB A1C): Hemoglobin A1C: 9.1 % — AB (ref 4.0–5.6)

## 2020-10-08 MED ORDER — SITAGLIPTIN PHOSPHATE 100 MG PO TABS: 100.0000 mg | ORAL_TABLET | Freq: Every day | ORAL | 3 refills | Status: DC

## 2020-10-08 NOTE — Assessment & Plan Note (Signed)
Well controlled continue lisinopril 10 mg daily

## 2020-10-08 NOTE — Patient Instructions (Addendum)
Continue Metformin and Glipizide Restart Januvia   Controlling diabetes is important for improving your overall health.   Lab Results  Component Value Date   HGBA1C 9.1 (A) 10/08/2020    The ideal Hemoglobin A1c is <7%   Diabetes can lead to: Kidney disease, vision issues, high blood pressure  How can you treat your diabetes 1) Intensive Lifestyle Program - [facility]  2) Regular exercise - 30 minutes of moderate activity, 5 times a week 3) Weight loss - only 7% 4) Modifying your diet - limit food high in sugar - go to [email or web address] to learn more 5) Medication   Every Year you should have the following:  1) Foot exam 2) Special diabetes eye exam - send [place] the result when you get this done 3) Urine test to look for signs of kidney disease    If you have kidney disease related to diabetes this is how we can treat it 1) Control your diabetes - first with metformin if tolerated 2) If you have high blood pressure - Take a medication that is an ACE inhibitor or ARB (like lisinopril or losartan)

## 2020-10-08 NOTE — Progress Notes (Signed)
Subjective:     Wanda [name] is a 75 y.o. female presenting for Follow-up (3 month- DM /Eye exam is scheduled for tomorrow )     HPI   #Diabetes Currently taking metformin and glipizide  Using medications without difficulties: Yes Hypoglycemic episodes:No  Hyperglycemic episodes:No  Feet problems:No  Blood Sugars averaging: does not check Last HgbA1c:  Lab Results  Component Value Date   HGBA1C 9.1 (A) 10/08/2020    Diabetes Health Maintenance Due:    Diabetes Health Maintenance Due  Topic Date Due  . OPHTHALMOLOGY EXAM  08/25/2020  . FOOT EXAM  12/26/2020  . HEMOGLOBIN A1C  04/10/2021    Diet: thinks she is eating wrong things, pancakes every morning  Exercise - started a water aerobics class, but only went for 6 sessions, planning to start walking program   Review of Systems  07/09/2020: Clinic - DM - restart januvia, cont metformin - diet/exercise. HDL - not at goal, plan to recheck  Social History   Tobacco Use  Smoking Status Never Smoker  Smokeless Tobacco Never Used        Objective:    BP Readings from Last 3 Encounters:  10/08/20 100/70  08/21/20 124/78  07/09/20 110/70   Wt Readings from Last 3 Encounters:  10/08/20 140 lb 12 oz (63.8 kg)  08/21/20 140 lb 12 oz (63.8 kg)  07/09/20 143 lb 8 oz (65.1 kg)    BP 100/70   Pulse 77   Temp (!) 97.1 F (36.2 C) (Temporal)   Ht 5\' 4"  (1.626 m)   Wt 140 lb 12 oz (63.8 kg)   SpO2 99%   BMI 24.16 kg/m    Physical Exam Constitutional:      General: She is not in acute distress.    Appearance: She is well-developed. She is not diaphoretic.  HENT:     Right Ear: External ear normal.     Left Ear: External ear normal.  Eyes:     Conjunctiva/sclera: Conjunctivae normal.  Cardiovascular:     Rate and Rhythm: Normal rate and regular rhythm.     Heart sounds: No murmur heard.   Pulmonary:     Effort: Pulmonary effort is normal. No respiratory distress.     Breath sounds: Normal breath  sounds. No wheezing.  Musculoskeletal:     Cervical back: Neck supple.  Skin:    General: Skin is warm and dry.     Capillary Refill: Capillary refill takes less than 2 seconds.  Neurological:     Mental Status: She is alert. Mental status is at baseline.  Psychiatric:        Mood and Affect: Mood normal.        Behavior: Behavior normal.           Assessment & Plan:   Problem List Items Addressed This Visit      Cardiovascular and Mediastinum   Essential hypertension    Well controlled continue lisinopril 10 mg daily        Endocrine   Type 2 diabetes mellitus with hyperglycemia, without long-term current use of insulin ([place]) - Primary    Poorly controlled. Restart januvia 100 mg. Cont metformin 2000 mg daily, and glipizide 5 mg bid. Work on diet and exercise. Return 3 months      Relevant Medications   sitaGLIPtin (JANUVIA) 100 MG tablet   Other Relevant Orders   POCT glycosylated hemoglobin (Hb A1C) (Completed)       Return in about 3  months (around 01/08/2021) for diabetes .  Lesleigh Noe, MD  This visit occurred during the SARS-CoV-2 public health emergency.  Safety protocols were in place, including screening questions prior to the visit, additional usage of staff PPE, and extensive cleaning of exam room while observing appropriate contact time as indicated for disinfecting solutions.

## 2020-10-08 NOTE — Assessment & Plan Note (Signed)
Poorly controlled. Restart januvia 100 mg. Cont metformin 2000 mg daily, and glipizide 5 mg bid. Work on diet and exercise. Return 3 months

## 2020-10-09 DIAGNOSIS — H524 Presbyopia: Secondary | ICD-10-CM | POA: Diagnosis not present

## 2020-10-09 DIAGNOSIS — H2513 Age-related nuclear cataract, bilateral: Secondary | ICD-10-CM | POA: Diagnosis not present

## 2020-10-09 DIAGNOSIS — E119 Type 2 diabetes mellitus without complications: Secondary | ICD-10-CM | POA: Diagnosis not present

## 2020-10-09 LAB — HM DIABETES EYE EXAM

## 2020-10-09 NOTE — Telephone Encounter (Signed)
Opened in error

## 2020-10-10 ENCOUNTER — Encounter: Payer: Self-pay | Admitting: Family Medicine

## 2020-10-19 ENCOUNTER — Telehealth: Payer: Self-pay | Admitting: Family Medicine

## 2020-10-19 NOTE — Telephone Encounter (Signed)
[facility] - Client TELEPHONE ADVICE RECORD [facility] Patient Name: Wanda [name] Gender: Female DOB: 07/27/1936 Age: 75 Y 2 M 22 D Return Phone Number: [phone] (Primary), [phone] (Secondary) Address: City/State/Zip: [place] [address] Client [facility] - Client Client Site [facility] - Day Physician Duncan, Shaw - MD Contact Type Call Who Is Calling Patient / Member / Family / Caregiver Call Type Triage / Clinical Caller Name Peggy Relationship To Patient Spouse Return Phone Number [phone] (Secondary) Chief Complaint ABDOMINAL PAIN - Severe and only in abdomen Reason for Call Symptomatic / Request for Health Information Initial Comment Caller states her husband has been experiencing abdominal pain since yesterday after eating lunch. Caller states her husband has explained the the pain may been coming from his intestines. GOTO Facility Not Listed [facility] Translation No Nurse Assessment Nurse: Wells, RN, Kathy Date/Time (Eastern Time): 10/19/2020 9:51:17 AM Confirm and document reason for call. If symptomatic, describe symptoms. ---Caller states her husband has been experiencing abdominal pain since yesterday after eating lunch. He reports constant abdominal pain with a pain score of 5/10 . Does the patient have any new or worsening symptoms? ---Yes Will a triage be completed? ---Yes Related visit to physician within the last 2 weeks? ---No Does the PT have any chronic conditions? (i.e. diabetes, asthma, this includes High risk factors for pregnancy, etc.) ---Yes List chronic conditions. ---HTN Is this a behavioral health or substance abuse call? ---No Guidelines Guideline Title Affirmed Question Affirmed Notes Nurse Date/Time (Eastern Time) Abdominal Pain - Female [1] MILD-MODERATE pain AND [2] constant AND [3] present > 2 hours Wells, RN, Kathy 10/19/2020 9:53:00 AM Disp.  Time (Eastern Time) Disposition Final User 10/19/2020 9:49:02 AM Send to Urgent Queue McCord, Ty[place]ia PLEASE NOTE: All timestamps contained within this report are represented as Eastern Standard Time. CONFIDENTIALTY NOTICE: This fax transmission is intended only for the addressee. It contains information that is legally privileged, confidential or otherwise protected from use or disclosure. If you are not the intended recipient, you are strictly prohibited from reviewing, disclosing, copying using or disseminating any of this information or taking any action in reliance on or regarding this information. If you have received this fax in error, please notify us immediately by telephone so that we can arrange for its return to us. Phone: [phone], Toll-Free: [phone], Fax: [phone] Page: 2 of 2 Call Id: [id number] 10/19/2020 9:59:19 AM See HCP within 4 Hours (or PCP triage) Yes Wells, RN, Kathy Caller Disagree/Comply Comply Caller Understands Yes PreDisposition Call Doctor Care Advice Given Per Guideline SEE HCP (OR PCP TRIAGE) WITHIN 4 HOURS: * IF OFFICE WILL BE OPEN: You need to be seen within the next 3 or 4 hours. Call your doctor (or NP/PA) now or as soon as the office opens. NOTHING BY MOUTH: * Do not eat or drink anything for now. REST: * Lie down. * Rest until seen. CALL BACK IF: * You become worse CARE ADVICE given per Abdominal Pain, Female (Adult) guideline. Comments User: Kathy, Wells, RN Date/Time (Eastern Time): 10/19/2020 10:08:44 AM Per Rena, no available appts, patient should go to [facility] Referrals GO TO FACILITY OTHER - SPECIFY 

## 2020-10-19 NOTE — Telephone Encounter (Signed)
Noted, agree with small sips of fluid.

## 2020-10-19 NOTE — Telephone Encounter (Signed)
Called patient to remind her about her appointment on Monday. Patient mentioned she has been throwing up all morning, transferred to [facility] for evaluation

## 2020-10-19 NOTE — Telephone Encounter (Signed)
Spoke with patient who stated that she was recently at the beach with her sister and they had a banana nut bread that she believes gave her food poisoning. Patient stated that her sister was vomiting last night as well. Patient stated that the last time she checked her temperature it was 99.1. Patient states that she is unable to keep anything on her stomach and has not taken her medication or eaten anything. Patient also reports having a slight headache and dizziness. Patient denies SOB, chest pain, abdominal pain, or diarrhea. Encouraged patient that if she was able to check her blood sugar she should do so to make sure it was normal. Patient stated that she would have to find her meter. Encouraged patient to sip fluids as tolerated and eat ice chips to keep hydrated. Instructed patient that if symptoms became worse she should report to UC or ED. Patient verbalized understanding.

## 2020-10-20 NOTE — Progress Notes (Signed)
Melicia Esqueda T. Josi Roediger, MD, [place]  [facility] [place] at [facility] [place], [address]  Phone: [phone]  FAX: [phone]  Wanda [name] - 75 y.o. female  MRN [id number]  Date of Birth: Aug 02, 1946  Date: 10/22/2020  PCP: Lesleigh Noe, MD  Referral: Lesleigh Noe, MD  Chief Complaint  Patient presents with  . Thumb Pain    Check bone spur in left thumb    This visit occurred during the SARS-CoV-2 public health emergency.  Safety protocols were in place, including screening questions prior to the visit, additional usage of staff PPE, and extensive cleaning of exam room while observing appropriate contact time as indicated for disinfecting solutions.   Subjective:   Wanda [name] is a 75 y.o. very pleasant female patient with Body mass index is 23.95 kg/m. who presents with the following:  The patient is here for me to evaluate her thumb.  On chart review, it seems as if she has a trigger thumb.  She has some pain at the MCP joint on the first digit on the left, and she has very notable loss of motion at the IP joint.  This is been present for the last few months.  Initially it did click, pop, and catch.  At this point it is not really doing that but her ability to flex it has decreased dramatically.  Sometimes it will hurt in the region of the A1 pulley, but there is also times where it does not hurt that bad.  She does have a known palpable nodule there.  She has had no specific injury to the region.  No prior significant injury.  Review of Systems is noted in the HPI, as appropriate   Objective:   BP 110/60   Pulse 78   Temp (!) 97.4 F (36.3 C) (Temporal)   Ht 5\' 4"  (1.626 m)   Wt 139 lb 8 oz (63.3 kg)   SpO2 99%   BMI 23.95 kg/m    L hand Ecchymosis or edema: neg ROM wrist/hand/digits: Approximate 65% loss of motion at the IP joint on the left  carpals, MCP's, digits: NT Distal  Ulna and Radius: NT Ecchymosis or edema: neg No instability Cysts/nodules: Notable flexor tendon nodule at the A1 pulley. Snuffbox tenderness: neg Scaphoid tubercle: NT Full composite fist: Her thumb is unable to make a complete composite fist Grip, all digits: 5/5 str DIPJT: NT PIP JT: NT MCP JT: Mildly tender at the nodule  no tenosynovitis Axial load test: neg Atrophy: neg  Hand sensation: intact   Radiology: DG Finger Thumb Left  Result Date: 08/21/2020 CLINICAL DATA:  Left thumb pain at the MCP joint.  No known injury. EXAM: LEFT THUMB 2+V COMPARISON:  None. FINDINGS: There is no evidence of fracture or dislocation. Mild osteophytosis is seen about the first [facility] joint. The first MCP joint and IP joint of the thumb appear normal. Soft tissues are unremarkable. IMPRESSION: Mild first CMC osteoarthritis.  Otherwise negative. Electronically Signed   By: Inge Rise M.D.   On: 08/21/2020 13:32   Assessment and Plan:     ICD-10-CM   1. Decreased range of motion of left thumb  M25.642   2. Trigger thumb, acquired  M65.319 triamcinolone acetonide (KENALOG-40) injection 20 mg  3. Thumb pain, left  M79.645    I really think that the issue here is more of the nodule at the A1 pulley.  By  history, it sounds as if she has had a very significant trigger finger in this region, and now this has progressed and she has quite significant loss of motion at the IP joint.  Unable to have the patient aggressively work on motion, and she will follow up with me in one 1 month..  Tendon Sheath Injection Procedure Note Norlene Lanes 08/06/1945 Date of procedure: 10/22/2020  Procedure: Tendon Sheath Injection for Trigger Finger, L 1st Indications: Pain  Procedure Details Verbal consent was obtained. Risks (including potential risk for skin lightening and potential atrophy), benefits and alternatives were discussed. Prepped with Chloraprep and Ethyl Chloride used for anesthesia. Under sterile  conditions, patient injected at palmar crease aiming distally with 45 degree angle towards nodule; injected directly into tendon sheath. Medication flowed freely without resistance.  Needle size: 22 gauge 1 1/2 inch Injection: 1/2 cc of Lidocaine 1% and Kenalog 20 mg Medication: 1/2 cc of Kenalog 40 mg (equaling Kenalog 20 mg)   Meds ordered this encounter  Medications  . triamcinolone acetonide (KENALOG-40) injection 20 mg    Signed,  Teandra Harlan T. Mattis Featherly, MD   Outpatient Encounter Medications as of 10/22/2020  Medication Sig  . acetaminophen (TYLENOL) 325 MG tablet Take 2 tablets by mouth as needed.  Marland Kitchen aspirin 81 MG chewable tablet Chew 1 tablet by mouth daily.  . busPIRone (BUSPAR) 15 MG tablet Take 7.5 mg by mouth daily.  Marland Kitchen glipiZIDE (GLUCOTROL) 5 MG tablet Take 1 tablet (5 mg total) by mouth 2 (two) times daily before a meal.  . glucose blood (ACCU-CHEK AVIVA PLUS) test strip Use as instructed to test blood sugar 2 times daily E11.45  . lisinopril (ZESTRIL) 10 MG tablet TAKE 1 TABLET BY MOUTH DAILY.  . metFORMIN (GLUCOPHAGE-XR) 500 MG 24 hr tablet Take 4 tablets (2,000 mg total) by mouth daily with breakfast.  . omeprazole (PRILOSEC) 20 MG capsule Take 20 mg by mouth every other day.  . pravastatin (PRAVACHOL) 20 MG tablet TAKE 1 TABLET BY MOUTH DAILY WITH EVENING MEAL  . sitaGLIPtin (JANUVIA) 100 MG tablet Take 1 tablet (100 mg total) by mouth daily.  . [EXPIRED] triamcinolone acetonide (KENALOG-40) injection 20 mg    No facility-administered encounter medications on file as of 10/22/2020.

## 2020-10-22 ENCOUNTER — Other Ambulatory Visit: Payer: Self-pay

## 2020-10-22 ENCOUNTER — Encounter: Payer: Self-pay | Admitting: Family Medicine

## 2020-10-22 ENCOUNTER — Ambulatory Visit: Payer: Medicare PPO | Admitting: Family Medicine

## 2020-10-22 VITALS — BP 110/60 | HR 78 | Temp 97.4°F | Ht 64.0 in | Wt 139.5 lb

## 2020-10-22 DIAGNOSIS — M65319 Trigger thumb, unspecified thumb: Secondary | ICD-10-CM | POA: Diagnosis not present

## 2020-10-22 DIAGNOSIS — M25642 Stiffness of left hand, not elsewhere classified: Secondary | ICD-10-CM

## 2020-10-22 DIAGNOSIS — M79645 Pain in left finger(s): Secondary | ICD-10-CM

## 2020-10-22 MED ORDER — TRIAMCINOLONE ACETONIDE 40 MG/ML IJ SUSP
20.0000 mg | Freq: Once | INTRAMUSCULAR | Status: AC
  Administered 2020-10-22: 20 mg via INTRA_ARTICULAR

## 2020-11-01 ENCOUNTER — Other Ambulatory Visit: Payer: Self-pay | Admitting: Internal Medicine

## 2020-11-13 ENCOUNTER — Encounter: Payer: Self-pay | Admitting: Internal Medicine

## 2020-11-13 ENCOUNTER — Ambulatory Visit: Payer: Medicare PPO | Admitting: Internal Medicine

## 2020-11-13 ENCOUNTER — Other Ambulatory Visit: Payer: Self-pay

## 2020-11-13 VITALS — BP 116/70 | HR 78 | Temp 97.4°F | Wt 141.0 lb

## 2020-11-13 DIAGNOSIS — M545 Low back pain, unspecified: Secondary | ICD-10-CM | POA: Diagnosis not present

## 2020-11-13 MED ORDER — METHOCARBAMOL 500 MG PO TABS: 500.0000 mg | ORAL_TABLET | Freq: Every evening | ORAL | 0 refills | Status: DC | PRN

## 2020-11-13 NOTE — Progress Notes (Signed)
HPI  Pt presents to the clinic today with back pain. She reports this started 2 weeks ago after moving some furniture. She descries the pain as achy and sharp. The pain radiates into her right groin and into her left buttock and hip. The pain is worse with movement and with ambulation. She denies numbness, tingling or weakness of her lower extremities. She denies urinary symptoms, loss of bowel or bladder control. She has tried Tylenol OTC, pain relief creams/patches with minimal relief of symptoms.   Review of Systems  Past Medical History:  Diagnosis Date  . Anxiety   . Colon polyps   . Depression   . DM (diabetes mellitus) ([place])   . Female bladder prolapse   . Fibromyalgia    "neurofibromyalgia"  . Headache   . HTN (hypertension)   . Hyperlipidemia     Family History  Problem Relation Age of Onset  . Heart attack Other   . Hypertension Mother   . Hyperlipidemia Mother   . Stroke Mother   . Heart disease Mother   . Heart disease Father   . Diabetes Sister   . Parkinson's disease Brother   . Diabetes Sister   . Breast cancer Sister   . Heart disease Maternal Grandmother   . Diabetes Maternal Grandfather   . Colon cancer Neg Hx   . Esophageal cancer Neg Hx     Social History   Socioeconomic History  . Marital status: Widowed    Spouse name: Not on file  . Number of children: 2  . Years of education: Master's degree  . Highest education level: Not on file  Occupational History    Comment: retired Pharmacist, hospital  Tobacco Use  . Smoking status: Never Smoker  . Smokeless tobacco: Never Used  Vaping Use  . Vaping Use: Never used  Substance and Sexual Activity  . Alcohol use: No    Alcohol/week: 0.0 standard drinks  . Drug use: No  . Sexual activity: Not Currently  Other Topics Concern  . 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) - has gone back some and may return parttime      Family: Scientist, physiological (mosiacism down's syndrome, lives in garage apartment) and Annie Main       Enjoys: pickle-ball prior to covid, traveling with sisters, house projects      Exercise: not currently - wants to get back to walking   Diet: trying to reduce portions, limit soda      Safety   Seat belts: Yes    Guns: No   Safe in relationships: Yes    Social Determinants of Radio broadcast assistant Strain: Not on file  Food Insecurity: Not on file  Transportation Needs: Not on file  Physical Activity: Not on file  Stress: Not on file  Social Connections: Not on file  Intimate Partner Violence: Not on file    Allergies  Allergen Reactions  . Niacin     REACTION: unbearable hot flashes     Constitutional: Denies fever, malaise, fatigue, headache or abrupt weight changes.   GI: Denies constipation, diarrhea, constipation or blood in her stool. GU: Pt reports urinary frequency. Denies urinary urgency, dysuria or blood in her urin. MSK: Pt reports low back pain. Denies decrease in ROM, joint swelling or difficulty with  gait. Skin: Denies redness, rashes, lesions or ulcercations.   No other specific complaints in a complete review of systems (except as listed in HPI above).    Objective:   Physical Exam  BP 116/70   Pulse 78   Temp (!) 97.4 F (36.3 C) (Temporal)   Wt 141 lb (64 kg)   SpO2 98%   BMI 24.20 kg/m   Wt Readings from Last 3 Encounters:  10/22/20 139 lb 8 oz (63.3 kg)  10/08/20 140 lb 12 oz (63.8 kg)  08/21/20 140 lb 12 oz (63.8 kg)    General: Appears her stated age, well developed, well nourished in NAD. Cardiovascular: Normal rate. Pulmonary/Chest: Normal effort.  MSK: Normal flexion, extension, rotation and lateral bending of the spine. Bony tenderness noted over the lumbar spine, bilateral SI joint and left trochanter. Decreased abduction, external rotation of the left hip. Normal adduction,  internal rotation of the left hip. Normal abduction, adduction and internal rotation of the right hip. Decreased external rotation of the right hip. Strength 5/5 BLE. No difficulty with gait. Neuro: Negative SLR bilaterally. Sensation intact to BLE.        Assessment & Plan:   Acute Bilateral Low Back Pain:  She is concerned about UTI but this is too low to be kidneys Likely MSK related Need to avoid NSAID's due to CKD Need to avoid steroids due to uncontrolled DM2 Encouraged heat, stretching and massage RX for Methocarbamol 500 mg QHS prn- sedation caution given Will hold off on imaging at this time unless symptoms persist or worsen  RTC as needed or if symptoms persist. Webb Silversmith, NP

## 2020-11-13 NOTE — Patient Instructions (Signed)

## 2020-11-25 LAB — HM DEXA SCAN

## 2020-11-28 ENCOUNTER — Other Ambulatory Visit: Payer: Self-pay | Admitting: Family Medicine

## 2020-12-05 ENCOUNTER — Other Ambulatory Visit: Payer: Self-pay | Admitting: Internal Medicine

## 2020-12-25 ENCOUNTER — Encounter: Payer: Self-pay | Admitting: Family Medicine

## 2020-12-25 DIAGNOSIS — M85852 Other specified disorders of bone density and structure, left thigh: Secondary | ICD-10-CM | POA: Diagnosis not present

## 2020-12-25 DIAGNOSIS — Z780 Asymptomatic menopausal state: Secondary | ICD-10-CM | POA: Diagnosis not present

## 2020-12-25 DIAGNOSIS — M85851 Other specified disorders of bone density and structure, right thigh: Secondary | ICD-10-CM | POA: Diagnosis not present

## 2020-12-25 LAB — HM DEXA SCAN

## 2020-12-26 ENCOUNTER — Telehealth: Payer: Self-pay | Admitting: Family Medicine

## 2020-12-26 NOTE — Telephone Encounter (Signed)
Patient wants to know if she can have her lab work ahead of her appt on 01/08/21 so that way she can discuss it with Dr Einar Pheasant at her appt. Please advise. EM

## 2020-12-26 NOTE — Telephone Encounter (Signed)
Here visit is for Diabetes. We are able to get her hemoglobin A1c at the beginning of the office visit.

## 2020-12-27 ENCOUNTER — Encounter: Payer: Self-pay | Admitting: Family Medicine

## 2020-12-27 NOTE — Telephone Encounter (Signed)
Left VM (DPR) relaying Dr. Verda Cumins message.

## 2021-01-01 ENCOUNTER — Encounter: Payer: Self-pay | Admitting: Family Medicine

## 2021-01-01 DIAGNOSIS — M8580 Other specified disorders of bone density and structure, unspecified site: Secondary | ICD-10-CM | POA: Insufficient documentation

## 2021-01-03 ENCOUNTER — Ambulatory Visit: Payer: Medicare PPO | Admitting: Family Medicine

## 2021-01-08 ENCOUNTER — Other Ambulatory Visit: Payer: Self-pay

## 2021-01-08 ENCOUNTER — Ambulatory Visit: Payer: Medicare PPO | Admitting: Family Medicine

## 2021-01-08 VITALS — BP 110/60 | HR 65 | Temp 97.0°F | Ht 64.0 in | Wt 140.5 lb

## 2021-01-08 DIAGNOSIS — K529 Noninfective gastroenteritis and colitis, unspecified: Secondary | ICD-10-CM | POA: Insufficient documentation

## 2021-01-08 DIAGNOSIS — R197 Diarrhea, unspecified: Secondary | ICD-10-CM | POA: Insufficient documentation

## 2021-01-08 DIAGNOSIS — E1165 Type 2 diabetes mellitus with hyperglycemia: Secondary | ICD-10-CM

## 2021-01-08 LAB — POCT GLYCOSYLATED HEMOGLOBIN (HGB A1C): Hemoglobin A1C: 9.4 % — AB (ref 4.0–5.6)

## 2021-01-08 MED ORDER — EMPAGLIFLOZIN 10 MG PO TABS: 10.0000 mg | ORAL_TABLET | Freq: Every day | ORAL | 1 refills | Status: DC

## 2021-01-08 NOTE — Patient Instructions (Signed)
[email or web address]   #Referral - nutritionist and pharmacist I have placed a referral to a specialist for you. You should receive a phone call from the specialty office. Make sure your voicemail is not full and that if you are able to answer your phone to unknown or new numbers.   It may take up to 2 weeks to hear about the referral. If you do not hear anything in 2 weeks, please call our office and ask to speak with the referral coordinator.    #Diabetes - stop [place] - start Jardiance - continue metformin and glipizide - work on diet changes -- start with [email or web address]

## 2021-01-08 NOTE — Assessment & Plan Note (Signed)
Pt notes diarrhea about 1 time per month. Advised healthy diet changes. Discussed this could be related to diabetes medication. If no improvement with healthy diet will consider additional work-up.

## 2021-01-08 NOTE — Assessment & Plan Note (Signed)
Lab Results  Component Value Date   HGBA1C 9.4 (A) 01/08/2021   Worse from prior. Barriers include poor adherence to medication (missing 2-3 doses per week), lack of exercise, and poor diet. Januvia seems to have caused nausea so will stop and start jardiance 10 mg. Cont metformin 2000 mg and glipizide 10 mg. Nutrition referral for diet education - [email or web address] for resources now. Avoid fast food

## 2021-01-08 NOTE — Progress Notes (Signed)
Subjective:     Wanda [name] is a 75 y.o. female presenting for Follow-up (3 mo- DM )     HPI  #Diabetes Currently taking Januvia 100 mg, metformin 2000 mg, glipizide 10 mg once daily Using medications without difficulties: endorses some nausea, tries to take with meal but feels the Januvia is causing some side effects Hypoglycemic episodes:No  Hyperglycemic episodes:Yes  Feet problems:No  Blood Sugars averaging: 170 - 240 Last HgbA1c:  Lab Results  Component Value Date   HGBA1C 9.4 (A) 01/08/2021   Forgets medication 2 times a week and will often take this in the evening  Not exercising Does not eat a lot but wonders if she is eating the wrong things - breakfast - pancake - lunch - taco - fastfood  - dinner - japanese   Diabetes Health Maintenance Due:    Diabetes Health Maintenance Due  Topic Date Due  . HEMOGLOBIN A1C  07/10/2021  . OPHTHALMOLOGY EXAM  10/09/2021  . FOOT EXAM  01/08/2022      Review of Systems   Social History   Tobacco Use  Smoking Status Never Smoker  Smokeless Tobacco Never Used        Objective:    BP Readings from Last 3 Encounters:  01/08/21 110/60  11/13/20 116/70  10/22/20 110/60   Wt Readings from Last 3 Encounters:  01/08/21 140 lb 8 oz (63.7 kg)  11/13/20 141 lb (64 kg)  10/22/20 139 lb 8 oz (63.3 kg)    BP 110/60   Pulse 65   Temp (!) 97 F (36.1 C) (Temporal)   Ht 5\' 4"  (1.626 m)   Wt 140 lb 8 oz (63.7 kg)   SpO2 99%   BMI 24.12 kg/m    Physical Exam Constitutional:      General: She is not in acute distress.    Appearance: She is well-developed. She is not diaphoretic.  HENT:     Right Ear: External ear normal.     Left Ear: External ear normal.  Eyes:     Conjunctiva/sclera: Conjunctivae normal.  Cardiovascular:     Rate and Rhythm: Normal rate and regular rhythm.  Pulmonary:     Effort: Pulmonary effort is normal. No respiratory distress.     Breath sounds: Normal breath sounds. No  wheezing.  Musculoskeletal:     Cervical back: Neck supple.  Skin:    General: Skin is warm and dry.     Capillary Refill: Capillary refill takes less than 2 seconds.  Neurological:     Mental Status: She is alert. Mental status is at baseline.  Psychiatric:        Mood and Affect: Mood normal.        Behavior: Behavior normal.    Diabetic Foot Exam - Simple   Simple Foot Form Diabetic Foot exam was performed with the following findings: Yes 01/08/2021 10:52 AM  Visual Inspection No deformities, no ulcerations, no other skin breakdown bilaterally: Yes Sensation Testing Intact to touch and monofilament testing bilaterally: Yes Pulse Check Posterior Tibialis and Dorsalis pulse intact bilaterally: Yes Comments           Assessment & Plan:   Problem List Items Addressed This Visit      Endocrine   Type 2 diabetes mellitus with hyperglycemia, without long-term current use of insulin (HCC) - Primary    Lab Results  Component Value Date   HGBA1C 9.4 (A) 01/08/2021   Worse from prior. Barriers include poor adherence to  medication (missing 2-3 doses per week), lack of exercise, and poor diet. Januvia seems to have caused nausea so will stop and start jardiance 10 mg. Cont metformin 2000 mg and glipizide 10 mg. Nutrition referral for diet education - [email or web address] for resources now. Avoid fast food      Relevant Medications   empagliflozin (JARDIANCE) 10 MG TABS tablet   Other Relevant Orders   POCT glycosylated hemoglobin (Hb A1C) (Completed)   Ambulatory referral to diabetic education   AMB Referral to [facility]     Other   Diarrhea    Pt notes diarrhea about 1 time per month. Advised healthy diet changes. Discussed this could be related to diabetes medication. If no improvement with healthy diet will consider additional work-up.           Return in about 3 months (around 04/10/2021) for diabetes.  Lesleigh Noe, MD  This visit occurred during the  SARS-CoV-2 public health emergency.  Safety protocols were in place, including screening questions prior to the visit, additional usage of staff PPE, and extensive cleaning of exam room while observing appropriate contact time as indicated for disinfecting solutions.

## 2021-01-29 ENCOUNTER — Other Ambulatory Visit: Payer: Self-pay | Admitting: Internal Medicine

## 2021-02-07 ENCOUNTER — Other Ambulatory Visit: Payer: Self-pay | Admitting: Family Medicine

## 2021-02-13 ENCOUNTER — Encounter: Payer: Self-pay | Admitting: Family Medicine

## 2021-02-19 ENCOUNTER — Other Ambulatory Visit: Payer: Self-pay

## 2021-02-19 ENCOUNTER — Encounter: Payer: Medicare PPO | Attending: Family Medicine | Admitting: *Deleted

## 2021-02-19 ENCOUNTER — Encounter: Payer: Self-pay | Admitting: *Deleted

## 2021-02-19 VITALS — BP 126/74 | Ht 64.0 in | Wt 136.5 lb

## 2021-02-19 DIAGNOSIS — E1165 Type 2 diabetes mellitus with hyperglycemia: Secondary | ICD-10-CM | POA: Diagnosis not present

## 2021-02-19 NOTE — Patient Instructions (Addendum)
Check blood sugars 1 x day before breakfast or 2 hrs after supper every day Bring blood sugar records to the next appointment  Exercise:  Begin stationary bike or walking  for    10  minutes   3  days a week and gradually increase to 30 minutes 5 x week  Eat 3 meals day,   1-2  snacks a day Space meals 4-6 hours apart Don't skip meals Limit fried foods, desserts and sweets  Complete 3 Day Food Record and bring to next appt  Return for appointment on:   Tuesday March 26, 2021 at 1:30 pm with Freda Munro (nurse)

## 2021-02-19 NOTE — Progress Notes (Signed)
Diabetes Self-Management Education  Visit Type: First/Initial  Appt. Start Time: 1345 Appt. End Time: 4765  02/19/2021  [name], identified by name and date of birth, is a 75 y.o. female with a diagnosis of Diabetes: Type 2.   ASSESSMENT  Blood pressure 126/74, height 5\' 4"  (1.626 m), weight 136 lb 8 oz (61.9 kg). Body mass index is 23.43 kg/m.   Diabetes Self-Management Education - 02/19/21 1546       Visit Information   Visit Type First/Initial      Initial Visit   Diabetes Type Type 2    Are you currently following a meal plan? Yes    What type of meal plan do you follow? "ordered Hello Fresh food"    Are you taking your medications as prescribed? Yes   sometimes she gets rushed in the mornings and doesn't take pills   Date Diagnosed 35 years      Health Coping   How would you rate your overall health? Fair      Psychosocial Assessment   Patient Belief/Attitude about Diabetes Defeat/Burnout   "tired of pills - forget often"   Self-care barriers None    Self-management support Doctor's office;Family    Patient Concerns Nutrition/Meal planning;Medication;Glycemic Control;Monitoring;Weight Control;Healthy Lifestyle    Special Needs None    Preferred Learning Style Auditory;Visual;Hands on    Learning Readiness Ready    How often do you need to have someone help you when you read instructions, pamphlets, or other written materials from your doctor or pharmacy? 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 Needs Review    Patient understands incorporating nutritional management into lifestyle. Needs Instruction    Patient undertands incorporating physical activity into lifestyle. Needs Instruction    Patient understands using medications safely. Needs Review    Patient understands monitoring blood glucose, interpreting and using results  Needs Review    Patient understands prevention, detection, and treatment of acute complications. Needs Instruction    Patient understands prevention, detection, and treatment of chronic complications. Needs Review    Patient understands how to develop strategies to address psychosocial issues. Needs Instruction    Patient understands how to develop strategies to promote health/change behavior. Needs Instruction      Complications   Last HgB A1C per patient/outside source 9.4 %   01/08/2021   How often do you check your blood sugar? 0 times/day (not testing)   Pt reported that she hasn't been checking her blood sugar on regular basis but she checked today after breakfast with reading of 232 mg/dL.   Have you had a dilated eye exam in the past 12 months? Yes    Have you had a dental exam in the past 12 months? Yes    Are you checking your feet? Yes    How many days per week are you checking your feet? 7      Dietary Intake   Breakfast egg, grits, corn beef hash, bread; pancake and syrup    Lunch skips or eats fast foods - Tacos Supreme; peanut butter and jelly on 2 pieces of toast    Dinner fried fish plate; hot dogs; chicken, beef, pork; potatoes, peas, beans, asparagus, broccoli, squash, zucchini, onions, carrots    Snack (evening) reports continuous snacking at night - chips, sweets, cereal and milk    Beverage(s)  water, unsweetened tea with artificial sweetener, diet soda      Exercise   Exercise Type ADL's      Patient Education   Previous Diabetes Education Yes (please comment)   "too long ago"   Disease state  Explored patient's options for treatment of their diabetes    Nutrition management  Role of diet in the treatment of diabetes and the relationship between the three main macronutrients and blood glucose level;Food label reading, portion sizes and measuring food.;Reviewed blood glucose goals for pre and post meals and how to evaluate the patients' food intake on their blood  glucose level.;Meal timing in regards to the patients' current diabetes medication.;Information on hints to eating out and maintain blood glucose control.    Physical activity and exercise  Role of exercise on diabetes management, blood pressure control and cardiac health.    Medications Reviewed patients medication for diabetes, action, purpose, timing of dose and side effects.;Other (comment)   Discussed use Insulin pens   Monitoring Purpose and frequency of SMBG.;Taught/discussed recording of test results and interpretation of SMBG.;Identified appropriate SMBG and/or A1C goals.    Chronic complications Relationship between chronic complications and blood glucose control    Psychosocial adjustment Role of stress on diabetes;Identified and addressed patients feelings and concerns about diabetes      Individualized Goals (developed by patient)   Reducing Risk Other (comment)   improve blood sugars, decrease medications, prevent diabetes complications, lose weight, lead a healthier lifestyle, become more fit     Outcomes   Expected Outcomes Demonstrated interest in learning. Expect positive outcomes    Future DMSE 4-6 wks             Individualized Plan for Diabetes Self-Management Training:   Learning Objective:  Patient will have a greater understanding of diabetes self-management. Patient education plan is to attend individual and/or group sessions per assessed needs and concerns.   Plan:   Patient Instructions  Check blood sugars 1 x day before breakfast or 2 hrs after supper every day Bring blood sugar records to the next appointment  Exercise:  Begin stationary bike or walking  for    10  minutes   3  days a week and gradually increase to 30 minutes 5 x week  Eat 3 meals day,   1-2  snacks a day Space meals 4-6 hours apart Don't skip meals Limit fried foods, desserts and sweets  Complete 3 Day Food Record and bring to next appt  Return for appointment on:   Tuesday March 26, 2021 at 1:30 pm with Freda Munro (nurse)  Expected Outcomes:  Demonstrated interest in learning. Expect positive outcomes  Education material provided:  General Meal Planning Guidelines Simple Meal Plan 3 Day Food Record  If problems or questions, patient to contact team via:   Johny Drilling, RN, [place] [phone]  Future DSME appointment: 4-6 wks March 26, 2021 with this nurse

## 2021-03-21 ENCOUNTER — Other Ambulatory Visit: Payer: Self-pay | Admitting: Family Medicine

## 2021-03-26 ENCOUNTER — Ambulatory Visit: Payer: Medicare PPO | Admitting: *Deleted

## 2021-04-02 ENCOUNTER — Encounter: Payer: Self-pay | Admitting: *Deleted

## 2021-04-02 ENCOUNTER — Encounter: Payer: Medicare PPO | Attending: Family Medicine | Admitting: *Deleted

## 2021-04-02 ENCOUNTER — Other Ambulatory Visit: Payer: Self-pay

## 2021-04-02 VITALS — BP 118/72 | Wt 137.4 lb

## 2021-04-02 DIAGNOSIS — E1165 Type 2 diabetes mellitus with hyperglycemia: Secondary | ICD-10-CM | POA: Insufficient documentation

## 2021-04-02 NOTE — Progress Notes (Signed)
Diabetes Self-Management Education  Visit Type: Follow-up  Appt. Start Time: 1330 Appt. End Time: 1430  04/02/2021  Ms. Berna Bue, identified by name and date of birth, is a 75 y.o. female with a diagnosis of Diabetes: Type 2   ASSESSMENT  Blood pressure 118/72, weight 137 lb 6.4 oz (62.3 kg). Body mass index is 23.58 kg/m.   Diabetes Self-Management Education - 04/02/21 1523       Visit Information   Visit Type Follow-up      Complications   How often do you check your blood sugar? 3-4 times / week    Fasting Blood glucose range (mg/dL) 70-129;130-179   She reports FBG's as high as 175 mg/dL but she has some readings of 111 and 122 mg/dL.   Have you had a dilated eye exam in the past 12 months? Yes    Have you had a dental exam in the past 12 months? Yes    Are you checking your feet? Yes    How many days per week are you checking your feet? 7      Dietary Intake   Breakfast 1-2 meals/day and 1 snack    Snack (morning) reports "not continuous snacking at night"    Beverage(s) water, Coke zero      Exercise   Exercise Type Other (comment)   stationary bike   How many days per week to you exercise? 2    How many minutes per day do you exercise? 30    Total minutes per week of exercise 60      Patient Education   Disease state  Explored patient's options for treatment of their diabetes    Nutrition management  Role of diet in the treatment of diabetes and the relationship between the three main macronutrients and blood glucose level;Food label reading, portion sizes and measuring food.;Carbohydrate counting;Reviewed blood glucose goals for pre and post meals and how to evaluate the patients' food intake on their blood glucose level.    Physical activity and exercise  Role of exercise on diabetes management, blood pressure control and cardiac health.    Medications Reviewed patients medication for diabetes, action, purpose, timing of dose and side effects.    Monitoring  Taught/discussed recording of test results and interpretation of SMBG.;Identified appropriate SMBG and/or A1C goals.    Chronic complications Relationship between chronic complications and blood glucose control    Psychosocial adjustment Identified and addressed patients feelings and concerns about diabetes      Individualized Goals (developed by patient)   Nutrition Follow meal plan discussed    Physical Activity Exercise 5-7 days per week;30 minutes per day    Medications take my medication as prescribed    Monitoring  test my blood glucose as discussed    Reducing Risk examine blood glucose patterns      Post-Education Assessment   Patient understands the diabetes disease and treatment process. Demonstrates understanding / competency    Patient understands incorporating nutritional management into lifestyle. Needs Review    Patient undertands incorporating physical activity into lifestyle. Demonstrates understanding / competency    Patient understands using medications safely. Needs Review    Patient understands monitoring blood glucose, interpreting and using results Demonstrates understanding / competency    Patient understands prevention, detection, and treatment of acute complications. Demonstrates understanding / competency    Patient understands prevention, detection, and treatment of chronic complications. Demonstrates understanding / competency    Patient understands how to develop strategies to address psychosocial  issues. Demonstrates understanding / competency    Patient understands how to develop strategies to promote health/change behavior. Demonstrates understanding / competency      Outcomes   Expected Outcomes Demonstrated interest in learning. Expect positive outcomes    Future DMSE PRN    Program Status Completed      Subsequent Visit   Since your last visit have you continued or begun to take your medications as prescribed? Yes    Since your last visit have you had  your blood pressure checked? No    Since your last visit have you experienced any weight changes? Gain    Weight Gain (lbs) 1    Since your last visit, are you checking your blood glucose at least once a day? Yes             Individualized Plan for Diabetes Self-Management Training:   Learning Objective:  Patient will have a greater understanding of diabetes self-management. Patient education plan is to attend individual and/or group sessions per assessed needs and concerns.   Plan:   Patient Instructions  Check blood sugars 1 x day before breakfast or 2 hrs after supper every day  Exercise:  Continue stationary bike for    30  minutes   5 days a week  Eat 3 meals day,   1-2  snacks a day Space meals 4-6 hours apart Allow 2-3 hours between meals and snacks Don't skip meals - eat at least 1 protein and 1 carbohydrate serving Continue to limit desserts/sweets and fried foods  Call back for any questions  Expected Outcomes:  Demonstrated interest in learning. Expect positive outcomes  Education material provided:  Planning a Balanced Meal Smart Snacking  If problems or questions, patient to contact team via:   Johny Drilling, RN, [place], [place] [phone]  Future DSME appointment: PRN

## 2021-04-02 NOTE — Patient Instructions (Signed)
Check blood sugars 1 x day before breakfast or 2 hrs after supper every day  Exercise:  Continue stationary bike for    30  minutes   5 days a week  Eat 3 meals day,   1-2  snacks a day Space meals 4-6 hours apart Allow 2-3 hours between meals and snacks Don't skip meals - eat at least 1 protein and 1 carbohydrate serving Continue to limit desserts/sweets and fried foods  Call back for any questions

## 2021-04-04 ENCOUNTER — Other Ambulatory Visit: Payer: Self-pay | Admitting: Family Medicine

## 2021-04-04 DIAGNOSIS — E1165 Type 2 diabetes mellitus with hyperglycemia: Secondary | ICD-10-CM

## 2021-04-11 ENCOUNTER — Ambulatory Visit: Payer: Medicare PPO | Admitting: Family Medicine

## 2021-04-18 ENCOUNTER — Ambulatory Visit: Payer: Medicare PPO | Admitting: Family Medicine

## 2021-04-18 ENCOUNTER — Other Ambulatory Visit: Payer: Self-pay

## 2021-04-18 VITALS — BP 130/72 | HR 77 | Temp 97.0°F | Wt 136.2 lb

## 2021-04-18 DIAGNOSIS — E1165 Type 2 diabetes mellitus with hyperglycemia: Secondary | ICD-10-CM | POA: Diagnosis not present

## 2021-04-18 DIAGNOSIS — R197 Diarrhea, unspecified: Secondary | ICD-10-CM | POA: Diagnosis not present

## 2021-04-18 DIAGNOSIS — M62838 Other muscle spasm: Secondary | ICD-10-CM

## 2021-04-18 DIAGNOSIS — R1319 Other dysphagia: Secondary | ICD-10-CM

## 2021-04-18 DIAGNOSIS — F411 Generalized anxiety disorder: Secondary | ICD-10-CM | POA: Diagnosis not present

## 2021-04-18 DIAGNOSIS — F4321 Adjustment disorder with depressed mood: Secondary | ICD-10-CM

## 2021-04-18 DIAGNOSIS — K219 Gastro-esophageal reflux disease without esophagitis: Secondary | ICD-10-CM

## 2021-04-18 LAB — POCT GLYCOSYLATED HEMOGLOBIN (HGB A1C): Hemoglobin A1C: 8.6 % — AB (ref 4.0–5.6)

## 2021-04-18 MED ORDER — METHOCARBAMOL 500 MG PO TABS: 500.0000 mg | ORAL_TABLET | Freq: Every evening | ORAL | 0 refills | Status: DC | PRN

## 2021-04-18 MED ORDER — GLIPIZIDE ER 10 MG PO TB24: 10.0000 mg | ORAL_TABLET | Freq: Every day | ORAL | 3 refills | Status: DC

## 2021-04-18 MED ORDER — EMPAGLIFLOZIN 25 MG PO TABS: 25.0000 mg | ORAL_TABLET | Freq: Every day | ORAL | 3 refills | Status: DC

## 2021-04-18 NOTE — Assessment & Plan Note (Signed)
C/b CKD and HLD.  Lab Results  Component Value Date   HGBA1C 8.6 (A) 04/18/2021  Poor control. Poor diet adherence and medication adherence. Switch glipizide 5 mg bid to glipizide XL 10 mg daily. Increase jardiance 25 mg. Cont metformin XR 2000 mg. Continue to monitor sugars. Discussed importance of dietary adherence. Briefly discussed insulin.

## 2021-04-18 NOTE — Assessment & Plan Note (Signed)
Pt notes some nausea and difficulty swallowing. Discussed nausea could be 2/2 to gastroparesis given poorly controlled diabetes. Cont omeprazole but referral to Dr. Collene Mares who did colonoscopy for additional support/evaluation

## 2021-04-18 NOTE — Assessment & Plan Note (Signed)
GAD elevated, pt noting worsening symptoms since decreasing buspar. Increase buspar 3.75>7.5 mg and to 15 mg daily if no improvement after 2 weeks. Return in 4 weeks for check in

## 2021-04-18 NOTE — Progress Notes (Signed)
Subjective:     Wanda [name] is a 75 y.o. female presenting for Follow-up (32mo DM )     HPI  #Diabetes Currently taking Jardiance, metformin, and glipizide  Using medications without difficulties: yes Hypoglycemic episodes:No  Hyperglycemic episodes:Yes   Blood Sugars averaging: 170-250 Last HgbA1c:  Lab Results  Component Value Date   HGBA1C 8.6 (A) 04/18/2021    Diabetes Health Maintenance Due:    Diabetes Health Maintenance Due  Topic Date Due   OPHTHALMOLOGY EXAM  10/09/2021   HEMOGLOBIN A1C  10/16/2021   FOOT EXAM  01/08/2022    Saw dietician - had a good experience Has been checking sugars and testing Taking them every day  - but does not take her afternoon glipizide     #food getting stuck - needs to go back to GI - also having abnormal bp - losing appetite  #anxiety and grieft - not sleeping well - worse this time of year - has been on buspar for a long time and decreased it when doing well but now worse - never on other medications  Review of Systems   Social History   Tobacco Use  Smoking Status Never  Smokeless Tobacco Never        Objective:    BP Readings from Last 3 Encounters:  04/18/21 130/72  04/02/21 118/72  02/19/21 126/74   Wt Readings from Last 3 Encounters:  04/18/21 136 lb 4 oz (61.8 kg)  04/02/21 137 lb 6.4 oz (62.3 kg)  02/19/21 136 lb 8 oz (61.9 kg)    BP 130/72   Pulse 77   Temp (!) 97 F (36.1 C) (Temporal)   Wt 136 lb 4 oz (61.8 kg)   SpO2 97%   BMI 23.39 kg/m    Physical Exam Constitutional:      General: She is not in acute distress.    Appearance: She is well-developed. She is not diaphoretic.  HENT:     Right Ear: External ear normal.     Left Ear: External ear normal.     Nose: Nose normal.  Eyes:     Conjunctiva/sclera: Conjunctivae normal.  Cardiovascular:     Rate and Rhythm: Normal rate and regular rhythm.  Pulmonary:     Effort: Pulmonary effort is normal.     Breath sounds:  Normal breath sounds.  Musculoskeletal:     Cervical back: Neck supple.  Skin:    General: Skin is warm and dry.     Capillary Refill: Capillary refill takes less than 2 seconds.  Neurological:     Mental Status: She is alert. Mental status is at baseline.  Psychiatric:        Mood and Affect: Mood normal.        Behavior: Behavior normal.    GAD 7 : Generalized Anxiety Score 04/18/2021 07/15/2018  Nervous, Anxious, on Edge 3 2  Control/stop worrying 2 3  Worry too much - different things 2 2  Trouble relaxing 3 1  Restless 1 1  Easily annoyed or irritable 2 2  Afraid - awful might happen 3 3  Total GAD 7 Score 16 14  Anxiety Difficulty Somewhat difficult Very difficult      The 10-year ASCVD risk score (Arnett DK, et al., 2019) is: 31.5%   Values used to calculate the score:     Age: 89years     Sex: Female     Is Non-Hispanic African American: No     Diabetic: Yes  Tobacco smoker: No     Systolic Blood Pressure: [id number] mmHg     Is BP treated: Yes     HDL Cholesterol: 39.3 mg/dL     Total Cholesterol: 181 mg/dL     Assessment & Plan:   Problem List Items Addressed This Visit       Digestive   GERD (gastroesophageal reflux disease)    Pt notes some nausea and difficulty swallowing. Discussed nausea could be 2/2 to gastroparesis given poorly controlled diabetes. Cont omeprazole but referral to Dr. Collene Mares who did colonoscopy for additional support/evaluation      Relevant Orders   Ambulatory referral to Gastroenterology     Endocrine   Type 2 diabetes mellitus with hyperglycemia, without long-term current use of insulin ([place]) - Primary    C/b CKD and HLD.  Lab Results  Component Value Date   HGBA1C 8.6 (A) 04/18/2021  Poor control. Poor diet adherence and medication adherence. Switch glipizide 5 mg bid to glipizide XL 10 mg daily. Increase jardiance 25 mg. Cont metformin XR 2000 mg. Continue to monitor sugars. Discussed importance of dietary adherence. Briefly  discussed insulin.         Relevant Medications   empagliflozin (JARDIANCE) 25 MG TABS tablet   glipiZIDE (GLUCOTROL XL) 10 MG 24 hr tablet   Other Relevant Orders   POCT glycosylated hemoglobin (Hb A1C) (Completed)     Other   Grief   Diarrhea   Relevant Orders   Ambulatory referral to Gastroenterology   Generalized anxiety disorder    GAD elevated, pt noting worsening symptoms since decreasing buspar. Increase buspar 3.75>7.5 mg and to 15 mg daily if no improvement after 2 weeks. Return in 4 weeks for check in      Muscle spasm    Intermittent and responds to prn methocarbamol. Refill provided. Discussed avoiding regular use.       Relevant Medications   methocarbamol (ROBAXIN) 500 MG tablet   Other Visit Diagnoses     Esophageal dysphagia       Relevant Orders   Ambulatory referral to Gastroenterology        Return in about 4 weeks (around 05/16/2021) for anxiety and diabetes.  Lesleigh Noe, MD  This visit occurred during the SARS-CoV-2 public health emergency.  Safety protocols were in place, including screening questions prior to the visit, additional usage of staff PPE, and extensive cleaning of exam room while observing appropriate contact time as indicated for disinfecting solutions.

## 2021-04-18 NOTE — Assessment & Plan Note (Signed)
Intermittent and responds to prn methocarbamol. Refill provided. Discussed avoiding regular use.

## 2021-04-18 NOTE — Patient Instructions (Addendum)
#  Diabetes - Increase Jardiance - new prescription sent - Change glipizide to extended release  #anxiety - Increase Buspar to 1/2 pill (7.5 mg) - after 2 weeks if no improvement, increase to 1 pill  - return in 4 weeks   Gastroenterology  Dr. Collene Mares  [facility] [address] [place], [place] [address] [phone]

## 2021-04-22 ENCOUNTER — Other Ambulatory Visit: Payer: Self-pay | Admitting: Family Medicine

## 2021-04-24 NOTE — Telephone Encounter (Signed)
Last filled by historical provider 06/08/20 Last OV: 04/18/21

## 2021-05-09 ENCOUNTER — Other Ambulatory Visit: Payer: Self-pay | Admitting: Family Medicine

## 2021-05-09 DIAGNOSIS — Z1231 Encounter for screening mammogram for malignant neoplasm of breast: Secondary | ICD-10-CM

## 2021-05-16 ENCOUNTER — Ambulatory Visit: Payer: Medicare PPO | Admitting: Family Medicine

## 2021-05-20 ENCOUNTER — Telehealth: Payer: Self-pay | Admitting: Family Medicine

## 2021-05-20 NOTE — Chronic Care Management (AMB) (Signed)
  Chronic Care Management   Note  05/20/2021 Name: Karita Dralle MRN: [id number] DOB: 1946/01/10  Ares Tegtmeyer is a 75 y.o. year old female who is a primary care patient of Einar Pheasant, Jobe Marker, MD. I reached out to Berna Bue by phone today in response to a referral sent by Ms. Lucianne Muss PCP, Lesleigh Noe, MD.   Ms. Christianson was given information about Chronic Care Management services today including:  CCM service includes personalized support from designated clinical staff supervised by her physician, including individualized plan of care and coordination with other care providers 24/7 contact phone numbers for assistance for urgent and routine care needs. Service will only be billed when office clinical staff spend 20 minutes or more in a month to coordinate care. Only one practitioner may furnish and bill the service in a calendar month. The patient may stop CCM services at any time (effective at the end of the month) by phone call to the office staff.   Patient agreed to services and verbal consent obtained.   Follow up plan:PT AWARE $20 [place]

## 2021-05-21 DIAGNOSIS — E1165 Type 2 diabetes mellitus with hyperglycemia: Secondary | ICD-10-CM | POA: Diagnosis not present

## 2021-05-21 DIAGNOSIS — F419 Anxiety disorder, unspecified: Secondary | ICD-10-CM | POA: Diagnosis not present

## 2021-05-21 DIAGNOSIS — I10 Essential (primary) hypertension: Secondary | ICD-10-CM | POA: Diagnosis not present

## 2021-05-21 DIAGNOSIS — E785 Hyperlipidemia, unspecified: Secondary | ICD-10-CM | POA: Diagnosis not present

## 2021-05-21 DIAGNOSIS — N393 Stress incontinence (female) (male): Secondary | ICD-10-CM | POA: Diagnosis not present

## 2021-05-21 DIAGNOSIS — Z7722 Contact with and (suspected) exposure to environmental tobacco smoke (acute) (chronic): Secondary | ICD-10-CM | POA: Diagnosis not present

## 2021-05-21 DIAGNOSIS — Z7984 Long term (current) use of oral hypoglycemic drugs: Secondary | ICD-10-CM | POA: Diagnosis not present

## 2021-05-21 DIAGNOSIS — K219 Gastro-esophageal reflux disease without esophagitis: Secondary | ICD-10-CM | POA: Diagnosis not present

## 2021-05-21 DIAGNOSIS — Z803 Family history of malignant neoplasm of breast: Secondary | ICD-10-CM | POA: Diagnosis not present

## 2021-05-23 ENCOUNTER — Other Ambulatory Visit: Payer: Self-pay

## 2021-05-23 ENCOUNTER — Ambulatory Visit: Payer: Medicare PPO | Admitting: Family Medicine

## 2021-05-23 VITALS — BP 118/80 | HR 79 | Temp 96.0°F | Ht 64.0 in | Wt 137.5 lb

## 2021-05-23 DIAGNOSIS — R42 Dizziness and giddiness: Secondary | ICD-10-CM | POA: Diagnosis not present

## 2021-05-23 DIAGNOSIS — N1831 Chronic kidney disease, stage 3a: Secondary | ICD-10-CM | POA: Diagnosis not present

## 2021-05-23 DIAGNOSIS — K219 Gastro-esophageal reflux disease without esophagitis: Secondary | ICD-10-CM

## 2021-05-23 DIAGNOSIS — F411 Generalized anxiety disorder: Secondary | ICD-10-CM | POA: Diagnosis not present

## 2021-05-23 DIAGNOSIS — E1122 Type 2 diabetes mellitus with diabetic chronic kidney disease: Secondary | ICD-10-CM

## 2021-05-23 MED ORDER — BUSPIRONE HCL 15 MG PO TABS: 7.5000 mg | ORAL_TABLET | Freq: Two times a day (BID) | ORAL | 1 refills | Status: DC

## 2021-05-23 NOTE — Assessment & Plan Note (Signed)
Advised daily use of omeprazole 20 mg. If no improvement in 1 week, consider reglan for possible gastroparesis. Agree with GI f/u

## 2021-05-23 NOTE — Patient Instructions (Addendum)
  [facility] Address: [address], [place], [place] [address]  Phone: [phone]  -- call for follow-up, ask if you can see one of the Doctors   Call [facility] to schedule appointment [phone]  - Call endocrine for follow-up visit, if unable to see them within 3 months please make a follow-up with someone in our office for diabetes check-in  Reflux - return to take omeprazole 20 mg daily - update via Mychart in 7-10 days - if nausea is the main issue is to consider Reglan    #Blood pressure - Take 1/2 lisinopril daily - check blood pressure - update with your blood pressure readings and whether or not you notice improvement in the dizziness

## 2021-05-23 NOTE — Assessment & Plan Note (Signed)
Significant improvement with restarting Buspar 7.5 mg BID. Continue this. Return as needed

## 2021-05-23 NOTE — Assessment & Plan Note (Signed)
Lab Results  Component Value Date   HGBA1C 8.6 (A) 04/18/2021  Poorly controlled. Pt reports not doing well with diet and only checking intermittently with fasting 120-160. She would like to get off metformin but she also does not want injectable. Discussed we would need to do a different medication. Already on Jardiance so would want to avoid Januvia due to pancreatitis risk. On glipizide, jardiance, metformin. Offered switching to ozempic/trulicity vs endocrinology f/u. She will call and f/u with endocrinology. Emphasized importance of diet and exercise to improve diabetes control.

## 2021-05-23 NOTE — Progress Notes (Signed)
Subjective:     Wanda [name] is a 75 y.o. female presenting for Follow-up (Anx/DM/Declined Flu shot )     HPI  #diabetes - reading a lot of about metformin  - concerns about early death and other possible age related side effects - Diet - was doing well when she was seeing the nutritionist and now feels she is going back to her old habits -- not eating on schedule and not eating healthy meals, is avoiding sugar and carbs - has only checked sugar at home a few times 121-160 for fasting - notes nausea is worse with taking the pills - is taking with food  #Anxiety - increased buspar to 7.5 mg - most of the time she feel OK - is doing better going to sleep - thinks she functions better if she just gets up and showers and leaves the  - notices more depression/anxiety when she does less activity    #Dizziness - when standing or bending over to pick something up - does not check her bp at home - but has been good at recent visits - taking lisinopril  Review of Systems   Social History   Tobacco Use  Smoking Status Never  Smokeless Tobacco Never        Objective:    BP Readings from Last 3 Encounters:  05/23/21 118/80  04/18/21 130/72  04/02/21 118/72   Wt Readings from Last 3 Encounters:  05/23/21 137 lb 8 oz (62.4 kg)  04/18/21 136 lb 4 oz (61.8 kg)  04/02/21 137 lb 6.4 oz (62.3 kg)    BP 118/80   Pulse 79   Temp (!) 96 F (35.6 C) (Temporal)   Ht 5\' 4"  (1.626 m)   Wt 137 lb 8 oz (62.4 kg)   SpO2 97%   BMI 23.60 kg/m    Physical Exam Constitutional:      General: She is not in acute distress.    Appearance: She is well-developed. She is not diaphoretic.  HENT:     Right Ear: External ear normal.     Left Ear: External ear normal.     Nose: Nose normal.  Eyes:     Conjunctiva/sclera: Conjunctivae normal.  Cardiovascular:     Rate and Rhythm: Normal rate and regular rhythm.     Heart sounds: No murmur heard. Pulmonary:     Effort: Pulmonary  effort is normal. No respiratory distress.     Breath sounds: Normal breath sounds. No wheezing.  Musculoskeletal:     Cervical back: Neck supple.  Skin:    General: Skin is warm and dry.     Capillary Refill: Capillary refill takes less than 2 seconds.  Neurological:     Mental Status: She is alert. Mental status is at baseline.  Psychiatric:        Mood and Affect: Mood normal.        Behavior: Behavior normal.     GAD 7 : Generalized Anxiety Score 05/23/2021 04/18/2021 07/15/2018  Nervous, Anxious, on Edge 1 3 2   Control/stop worrying 0 2 3  Worry too much - different things 1 2 2   Trouble relaxing 0 3 1  Restless 0 1 1  Easily annoyed or irritable 1 2 2   Afraid - awful might happen 0 3 3  Total GAD 7 Score 3 16 14   Anxiety Difficulty Not difficult at all Somewhat difficult Very difficult         Assessment & Plan:   Problem List  Items Addressed This Visit       Digestive   GERD (gastroesophageal reflux disease)    Advised daily use of omeprazole 20 mg. If no improvement in 1 week, consider reglan for possible gastroparesis. Agree with GI f/u        Endocrine   Type 2 diabetes mellitus with stage 3a chronic kidney disease, without long-term current use of insulin ([place]) - Primary    Lab Results  Component Value Date   HGBA1C 8.6 (A) 04/18/2021  Poorly controlled. Pt reports not doing well with diet and only checking intermittently with fasting 120-160. She would like to get off metformin but she also does not want injectable. Discussed we would need to do a different medication. Already on Jardiance so would want to avoid Januvia due to pancreatitis risk. On glipizide, jardiance, metformin. Offered switching to ozempic/trulicity vs endocrinology f/u. She will call and f/u with endocrinology. Emphasized importance of diet and exercise to improve diabetes control.          Other   Generalized anxiety disorder    Significant improvement with restarting Buspar 7.5 mg  BID. Continue this. Return as needed      Relevant Medications   busPIRone (BUSPAR) 15 MG tablet   Postural dizziness    BP well controlled. Suspect this could be orthostatic hypotension. Advised decreasing lisinopril 10>5 mg. Monitoring bp at home and update next week with change in dizziness symptoms.         Return in about 4 months (around 09/23/2021) for for diabetes/anxiety.  Lesleigh Noe, MD  This visit occurred during the SARS-CoV-2 public health emergency.  Safety protocols were in place, including screening questions prior to the visit, additional usage of staff PPE, and extensive cleaning of exam room while observing appropriate contact time as indicated for disinfecting solutions.

## 2021-05-23 NOTE — Assessment & Plan Note (Addendum)
BP well controlled. Suspect this could be orthostatic hypotension. Advised decreasing lisinopril 10>5 mg. Monitoring bp at home and update next week with change in dizziness symptoms.

## 2021-06-04 ENCOUNTER — Other Ambulatory Visit: Payer: Self-pay | Admitting: Family Medicine

## 2021-06-13 ENCOUNTER — Ambulatory Visit
Admission: RE | Admit: 2021-06-13 | Discharge: 2021-06-13 | Disposition: A | Discharge status: Home / Self Care | Payer: Medicare PPO | Source: Ambulatory Visit | Attending: Family Medicine | Admitting: Family Medicine

## 2021-06-13 DIAGNOSIS — Z1231 Encounter for screening mammogram for malignant neoplasm of breast: Secondary | ICD-10-CM | POA: Diagnosis not present

## 2021-07-10 ENCOUNTER — Other Ambulatory Visit: Payer: Self-pay | Admitting: Family Medicine

## 2021-07-15 ENCOUNTER — Telehealth: Payer: Self-pay

## 2021-07-15 NOTE — Progress Notes (Signed)
Chronic Care Management Pharmacy Assistant   Name: Wanda [name]  MRN: [id number] DOB: Apr 04, 1946  Reason for Encounter: CCM (Initial Questions)   Recent office visits:  05/23/2021 - Wanda Schooner, MD - Patient presented for follow up for diabetes. Continue Buspar 7.5 mg BID due to significant improvement. Change: Decrease Lisinopril 10 > 5mg  due to orthostatic hypotension.  04/18/2021 - Wanda Schooner, MD - Patient presented for diabetes follow up. Referral to Gastroenterology due to nausea and difficulty swallowing. Change: switch glipizide 5 mg bid to glipizide XL 10 mg daily. Change: Increase jardiance to 25 mg. Change: Increase buspar 3.75 > 7.5 mg and to 15 mg daily if no improvement after 2 weeks for anxiety.   Recent consult visits:  04/02/2021 - Wanda Drilling, RN - Nutrition - Patient presented for diabetes. Recommended: Check blood sugars 1 x day before breakfast or 2 hrs after supper every day.  02/25/2021 - Wanda Needle, RN - Urology - Telephone - Start: cephalexin [facility]) 500 MG capsule for 5 days.  02/20/2021 - Wanda [name] - Urology - Patient presented for urinary frequency and prolapse of vaginal wall. No other information provided.  02/19/2021 - Wanda Drilling, RN - Nutrition - Patient presented for diabetes. Recommended: Eat 3 meals day, 1-2  snacks a day, space meals 4-6 hours apart, don't skip meals and limit fried foods, desserts and sweets. Complete 3 Day Food Record and bring to next appt.  Hospital visits:  None in previous 6 months  Medications: Outpatient Encounter Medications as of 07/15/2021  Medication Sig   acetaminophen (TYLENOL) 325 MG tablet Take 2 tablets by mouth as needed.   busPIRone (BUSPAR) 15 MG tablet Take 0.5 tablets (7.5 mg total) by mouth 2 (two) times daily.   empagliflozin (JARDIANCE) 25 MG TABS tablet Take 1 tablet (25 mg total) by mouth daily before breakfast.   glipiZIDE (GLUCOTROL XL) 10 MG 24 hr tablet Take 1 tablet (10 mg total)  by mouth daily with breakfast.   glucose blood (ACCU-CHEK AVIVA PLUS) test strip Use as instructed to test blood sugar 2 times daily E11.45   lisinopril (ZESTRIL) 10 MG tablet TAKE 1 TABLET BY MOUTH DAILY.   metFORMIN (GLUCOPHAGE-XR) 500 MG 24 hr tablet TAKE 4 TABLETS (2,000 MG TOTAL) BY MOUTH DAILY WITH BREAKFAST.   methocarbamol (ROBAXIN) 500 MG tablet Take 1 tablet (500 mg total) by mouth at bedtime as needed for muscle spasms.   omeprazole (PRILOSEC) 20 MG capsule TAKE 1 CAPSULE BY MOUTH DAILY AS NEEDED.   pravastatin (PRAVACHOL) 20 MG tablet TAKE 1 TABLET BY MOUTH DAILY WITH EVENING MEAL   No facility-administered encounter medications on file as of 07/15/2021.   Lab Results  Component Value Date/Time   HGBA1C 8.6 (A) 04/18/2021 11:49 AM   HGBA1C 9.4 (A) 01/08/2021 10:20 AM   HGBA1C 9.3 (H) 07/06/2020 02:33 PM   HGBA1C 8.0 (H) 08/23/2019 02:36 PM   HGBA1C 7.7 08/25/2018 10:28 AM   MICROALBUR 1.3 10/02/2014 03:01 PM   MICROALBUR 1.9 08/05/2013 09:20 AM    BP Readings from Last 3 Encounters:  05/23/21 118/80  04/18/21 130/72  04/02/21 118/72   Patient contacted to review initial questions prior to visit with Wanda [name].  Have you seen any other providers since your last visit with PCP? No  Any changes in your medications or health? Yes - Continue Buspar 7.5 mg BID due to significant improvement. Change: Decrease Lisinopril 10 > 5mg  due to orthostatic hypotension.   Any side effects from any  medications? Yes - Patient states sometimes she feels worse when she takes all of her medications verses days she does not take them. She feels better when all pills have worn off. Patient takes all her medications in the morning with food. When she does she doesn't feel good; feels like she cant move as freely and her body seems heavier. States there are times when she doesn't remember to take them.   Do you have an symptoms or problems not managed by your medications? Yes - Patient  states she hasn't had full blood work in a long time and she is concerned about kidney function. States she urinates more frequently - patient states she can hold it only a certain length of time. If she holds it long when she does get to the bathroom she will wet her self before she goes. She does wear protective undergarments. States she had bladder tack over a year ago and still goes to see Dr. Zigmund [name]..   Any concerns about your health right now? Yes - Patient states she hasn't had full blood work in a long time and she is concerned about kidney function. States she urinates more frequently - patient states she can hold it only a certain length of time. If she holds it long when she does get to the bathroom she will wet her self before she goes. She does wear protective undergarments. States she had bladder tack over a year ago and still goes to see Dr. Zigmund [name].Marland Kitchen   Has your provider asked that you check blood pressure, blood sugar, or follow special diet at home? Yes - blood sugar. Patient was asked to take and record her blood sugar daily starting today until the call with Wanda [name]. Patient was advised that Wanda [name] will ask for the readings. Patient understood and agreed.   Do you get any type of exercise on a regular basis? No  Can you think of a goal you would like to reach for your health? Yes - Patient states she would like to lose 10 pounds off her abdomin area.   Do you have any problems getting your medications? No  Is there anything that you would like to discuss during the appointment? Yes - Patient would like to discuss issues noted above.   Spoke with patient and reminded them to have all medications, supplements and any blood glucose and blood pressure readings available for review with pharmacist, at their telephone visit on 07/19/2021 at 9:00.   Star Rating Drugs:  Medication:  Last Fill: Day Supply Pravastatin 20 mg 06/05/2021 90 Jardiance 25  mg 04/18/2021 90  Glipizide 10 mg 04/18/2021 90 Metformin 2000 mg 04/09/2021 90 Lisinopril 10 mg 07/10/2021 90 Fill dates verified with [facility]   Care Gaps: Annual wellness visit in last year? No Most Recent BP reading: 118/80 on 05/23/2021  If Diabetic: Most recent A1C reading: 8.6 on 04/18/2021 Last eye exam / retinopathy screening: Up to date Last diabetic foot exam: Up to date  Wanda [name], CPP notified  Marijean Niemann, [place] [phone]  Time Spent:  73 Minutes

## 2021-07-16 DIAGNOSIS — N182 Chronic kidney disease, stage 2 (mild): Secondary | ICD-10-CM | POA: Diagnosis not present

## 2021-07-16 LAB — MICROALBUMIN / CREATININE URINE RATIO: Microalb Creat Ratio: 0.156

## 2021-07-16 LAB — CBC AND DIFFERENTIAL
HCT: 40 (ref 36–46)
Hemoglobin: 13.2 (ref 12.0–16.0)
Platelets: 283 (ref 150–399)
WBC: 7.2

## 2021-07-16 LAB — COMPREHENSIVE METABOLIC PANEL
Albumin: 4.3 (ref 3.5–5.0)
Calcium: 9.9 (ref 8.7–10.7)

## 2021-07-16 LAB — BASIC METABOLIC PANEL
BUN: 22 — AB (ref 4–21)
CO2: 26 — AB (ref 13–22)
Chloride: 106 (ref 99–108)
Creatinine: 1 (ref <=1.1)
Glucose: 119
Potassium: 4.1 (ref 3.4–5.3)
Sodium: 142 (ref 137–147)

## 2021-07-19 ENCOUNTER — Ambulatory Visit (INDEPENDENT_AMBULATORY_CARE_PROVIDER_SITE_OTHER): Payer: Medicare PPO | Admitting: Pharmacist

## 2021-07-19 ENCOUNTER — Other Ambulatory Visit: Payer: Self-pay

## 2021-07-19 DIAGNOSIS — I10 Essential (primary) hypertension: Secondary | ICD-10-CM

## 2021-07-19 DIAGNOSIS — E785 Hyperlipidemia, unspecified: Secondary | ICD-10-CM

## 2021-07-19 DIAGNOSIS — E1165 Type 2 diabetes mellitus with hyperglycemia: Secondary | ICD-10-CM

## 2021-07-19 DIAGNOSIS — F411 Generalized anxiety disorder: Secondary | ICD-10-CM

## 2021-07-19 DIAGNOSIS — K219 Gastro-esophageal reflux disease without esophagitis: Secondary | ICD-10-CM

## 2021-07-19 NOTE — Progress Notes (Signed)
Chronic Care Management Pharmacy Note  07/24/2021 Name:  Wanda [name] MRN:  [id number] DOB:  03-26-1946  Summary: -Pt takes all medications together once a day because that is the only way she can remember to take them - she even misses meds 1-2 x per week this way and reports feeling better on days she does not take her meds -Pt reduced her metformin from 4 tab to 3 tab daily due to GI upset, and still reports GI upset with 3 tabs. She is unable to split the dose up due to above issue -Pt has been on glipizide on-and-off for ~10 years; benefit is likely wearing off at this point -Pt is due for PCP f/u in February 2023  Recommendations/Changes made from today's visit: -Recommend to reduce metformin to 2 tab daily (previously tolerated) AS LONG AS she works on taking medications consistently on daily basis -Consider switching glipizide to GLP-1 (Ozempic, Trulicity) in future if A1c remains above goal  Plan: -Pharmacist follow up televisit scheduled for 1 month; schedule PCP f/u for Feb 2023 at that point   Subjective: Wanda [name] is an 75 y.o. year old female who is a primary patient of Cody, Jobe Marker, MD.  The CCM team was consulted for assistance with disease management and care coordination needs.    Engaged with patient by telephone for initial visit in response to provider referral for pharmacy case management and/or care coordination services.   Consent to Services:  The patient was given the following information about Chronic Care Management services today, agreed to services, and gave verbal consent: 1. CCM service includes personalized support from designated clinical staff supervised by the primary care provider, including individualized plan of care and coordination with other care providers 2. 24/7 contact phone numbers for assistance for urgent and routine care needs. 3. Service will only be billed when office clinical staff spend 20 minutes or more in a month to coordinate  care. 4. Only one practitioner may furnish and bill the service in a calendar month. 5.The patient may stop CCM services at any time (effective at the end of the month) by phone call to the office staff. 6. The patient will be responsible for cost sharing (co-pay) of up to 20% of the service fee (after annual deductible is met). Patient agreed to services and consent obtained.  Patient Care Team: Lesleigh Noe, MD as PCP - General (Family Medicine) Katy Apo, MD as Consulting Physician (Ophthalmology) [facility], Melanie Crazier, MD as Consulting Physician (Endocrinology) Willia Craze, NP as Nurse Practitioner (Gastroenterology) Charlton Haws, [facility] as Pharmacist (Pharmacist)  Recent office visits: 05/23/2021 - [name], MD - Patient presented for follow up for diabetes. Continue Buspar 7.5 mg BID due to significant improvement. Change: Decrease Lisinopril 10 > 61m due to orthostatic hypotension.   04/18/2021 - [name] MD - Patient presented for diabetes follow up. Referral to Gastroenterology due to nausea and difficulty swallowing. Change: switch glipizide 5 mg bid to glipizide XL 10 mg daily. Change: Increase jardiance to 25 mg. Change: Increase buspar 3.75 > 7.5 mg and to 15 mg daily if no improvement after 2 weeks for anxiety.   Recent consult visits: 04/02/2021 - SJohny Drilling RN - Nutrition - Patient presented for diabetes. Recommended: Check blood sugars 1 x day before breakfast or 2 hrs after supper every day.   02/25/2021 - TCasimiro Needle RN - Urology - Telephone - Start: cephalexin ([facility] 500 MG capsule for 5 days.   02/20/2021 -  [place] Pink - Urology - Patient presented for urinary frequency and prolapse of vaginal wall. No other information provided.   02/19/2021 - Johny Drilling, RN - Nutrition - Patient presented for diabetes. Recommended: Eat 3 meals day, 1-2  snacks a day, space meals 4-6 hours apart, don't skip meals and limit fried  foods, desserts and sweets. Complete 3 Day Food Record and bring to next appt.   Hospital visits: None in previous 6 months   Objective:  Lab Results  Component Value Date   CREATININE 0.93 07/06/2020   BUN 15 07/06/2020   GFR 61.34 07/06/2020   GFRNONAA 57 (L) 06/23/2014   GFRAA 67 (L) 06/23/2014   NA 143 07/06/2020   K 4.7 07/06/2020   CALCIUM 9.9 07/06/2020   CO2 29 07/06/2020   GLUCOSE 176 (H) 07/06/2020    Lab Results  Component Value Date/Time   HGBA1C 8.6 (A) 04/18/2021 11:49 AM   HGBA1C 9.4 (A) 01/08/2021 10:20 AM   HGBA1C 9.3 (H) 07/06/2020 02:33 PM   HGBA1C 8.0 (H) 08/23/2019 02:36 PM   HGBA1C 7.7 08/25/2018 10:28 AM   GFR 61.34 07/06/2020 02:33 PM   GFR 57.16 (L) 08/23/2019 02:36 PM   GFR 57.16 (L) 08/23/2019 02:36 PM   MICROALBUR 1.3 10/02/2014 03:01 PM   MICROALBUR 1.9 08/05/2013 09:20 AM    Last diabetic Eye exam:  Lab Results  Component Value Date/Time   HMDIABEYEEXA No Retinopathy 10/09/2020 12:00 AM    Last diabetic Foot exam: No results found for: HMDIABFOOTEX   Lab Results  Component Value Date   CHOL 181 07/06/2020   HDL 39.30 07/06/2020   LDLCALC 84 05/05/2017   LDLDIRECT 96.0 07/06/2020   TRIG 279.0 (H) 07/06/2020   CHOLHDL 5 07/06/2020    Hepatic Function Latest Ref Rng & Units 07/06/2020 08/23/2019 08/23/2019  Total Protein 6.0 - 8.3 g/dL 7.4 7.3 -  Albumin 3.5 - 5.2 g/dL 4.2 4.1 4.1  AST 0 - 37 U/L 32 21 -  ALT 0 - 35 U/L 34 27 -  Alk Phosphatase 39 - 117 U/L 90 85 -  Total Bilirubin 0.2 - 1.2 mg/dL 0.4 0.3 -  Bilirubin, Direct 0.0 - 0.3 mg/dL - - -    Lab Results  Component Value Date/Time   TSH 3.57 08/23/2019 02:36 PM   TSH 4.36 05/18/2019 01:32 PM    CBC Latest Ref Rng & Units 07/06/2020 08/23/2019 10/25/2018  WBC 4.0 - 10.5 K/uL 7.8 7.2 6.7  Hemoglobin 12.0 - 15.0 g/dL 12.8 11.7(L) 12.0  Hematocrit 36.0 - 46.0 % 38.8 36.0 36  Platelets 150.0 - 400.0 K/uL 291.0 245.0 368    Lab Results  Component Value Date/Time    VD25OH 21.70 (L) 08/23/2019 02:36 PM    Clinical ASCVD: No  The 10-year ASCVD risk score (Arnett DK, et al., 2019) is: 38%   Values used to calculate the score:     Age: 34 years     Sex: Female     Is Non-Hispanic African American: No     Diabetic: Yes     Tobacco smoker: Yes     Systolic Blood Pressure: 563 mmHg     Is BP treated: Yes     HDL Cholesterol: 39.3 mg/dL     Total Cholesterol: 181 mg/dL    Depression screen [facility] 2/9 05/23/2021 02/19/2021 08/22/2019  Decreased Interest 1 0 0  Down, Depressed, Hopeless 0 0 0  PHQ - 2 Score 1 0 0  Altered sleeping 1 - -  Tired, decreased energy 1 - -  Change in appetite 1 - -  Feeling bad or failure about yourself  0 - -  Trouble concentrating 0 - -  Moving slowly or fidgety/restless 0 - -  Suicidal thoughts 0 - -  PHQ-9 Score 4 - -  Difficult doing work/chores Not difficult at all - -  Some recent data might be hidden     Social History   Tobacco Use  Smoking Status Never  Smokeless Tobacco Never   BP Readings from Last 3 Encounters:  05/23/21 118/80  04/18/21 130/72  04/02/21 118/72   Pulse Readings from Last 3 Encounters:  05/23/21 79  04/18/21 77  01/08/21 65   Wt Readings from Last 3 Encounters:  05/23/21 137 lb 8 oz (62.4 kg)  04/18/21 136 lb 4 oz (61.8 kg)  04/02/21 137 lb 6.4 oz (62.3 kg)   BMI Readings from Last 3 Encounters:  05/23/21 23.60 kg/m  04/18/21 23.39 kg/m  04/02/21 23.58 kg/m    Assessment/Interventions: Review of patient past medical history, allergies, medications, health status, including review of consultants reports, laboratory and other test data, was performed as part of comprehensive evaluation and provision of chronic care management services.   SDOH:  (Social Determinants of Health) assessments and interventions performed: Yes SDOH Interventions    Flowsheet Row Most Recent Value  SDOH Interventions   Financial Strain Interventions Intervention Not Indicated      SDOH  Screenings   Alcohol Screen: Not on file  Depression (PHQ2-9): Low Risk    PHQ-2 Score: 4  Financial Resource Strain: Low Risk    Difficulty of Paying Living Expenses: Not very hard  Food Insecurity: Not on file  Housing: Not on file  Physical Activity: Not on file  Social Connections: Not on file  Stress: Not on file  Tobacco Use: Low Risk    Smoking Tobacco Use: Never   Smokeless Tobacco Use: Never   Passive Exposure: Not on file  Transportation Needs: Not on file    [place]  Allergies  Allergen Reactions   Niacin     REACTION: unbearable hot flashes   Sulfamethoxazole-Trimethoprim Itching    Medications Reviewed Today     Reviewed by Charlton Haws, [facility] (Pharmacist) on 07/24/21 at 1128  Med List Status: <None>   Medication Order Taking? Sig Documenting Provider Last Dose Status Informant  acetaminophen (TYLENOL) 325 MG tablet [id number] Yes Take 2 tablets by mouth as needed. [provider] Taking Active   busPIRone (BUSPAR) 15 MG tablet [id number] Yes Take 0.5 tablets (7.5 mg total) by mouth 2 (two) times daily.  Patient taking differently: Take 7.5 mg by mouth 2 (two) times daily. PRN   Lesleigh Noe, MD Taking Active   empagliflozin (JARDIANCE) 25 MG TABS tablet [id number] Yes Take 1 tablet (25 mg total) by mouth daily before breakfast. Lesleigh Noe, MD Taking Active   glipiZIDE (GLUCOTROL XL) 10 MG 24 hr tablet [id number] Yes Take 1 tablet (10 mg total) by mouth daily with breakfast. Lesleigh Noe, MD Taking Active   glucose blood (ACCU-CHEK AVIVA PLUS) test strip [id number] Yes Use as instructed to test blood sugar 2 times daily E11.45 Shamleffer, Melanie Crazier, MD Taking Active   lisinopril (ZESTRIL) 10 MG tablet [id number] Yes TAKE 1 TABLET BY MOUTH DAILY. Lesleigh Noe, MD Taking Active   metFORMIN (GLUCOPHAGE-XR) 500 MG 24 hr tablet [id number] Yes TAKE 4 TABLETS (2,000 MG TOTAL) BY MOUTH DAILY WITH BREAKFAST.  Patient taking differently:  Take 1,500 mg by mouth daily with breakfast. (3 tablets)   Lesleigh Noe, MD Taking Active   omeprazole (PRILOSEC) 20 MG capsule [id number] Yes TAKE 1 CAPSULE BY MOUTH DAILY AS NEEDED.  Patient taking differently: Take 20 mg by mouth every other day.   Lesleigh Noe, MD Taking Active   pravastatin (PRAVACHOL) 20 MG tablet [id number] Yes TAKE 1 TABLET BY MOUTH DAILY WITH EVENING MEAL Lesleigh Noe, MD Taking Active             Patient Active Problem List   Diagnosis Date Noted   Postural dizziness 05/23/2021   Generalized anxiety disorder 04/18/2021   Muscle spasm 04/18/2021   Diarrhea 01/08/2021   Osteopenia 01/01/2021   Thumb pain, left 08/21/2020   Shortness of breath 07/09/2020   Tinnitus of both ears 07/09/2020   Type 2 diabetes mellitus with hyperglycemia, without long-term current use of insulin ([place]) 10/03/2019   Type 2 diabetes mellitus with stage 3a chronic kidney disease, without long-term current use of insulin ([place]) 10/03/2019   Disease related peripheral neuropathy 08/22/2019   Urinary frequency 05/18/2019   Stress incontinence of urine 02/21/2019   Grief 07/15/2018   Pelvic prolapse 01/22/2017   Hyperlipidemia associated with type 2 diabetes mellitus ([place]) 05/03/2014   GERD (gastroesophageal reflux disease) 12/05/2013   CKD (chronic kidney disease) stage 3, GFR 30-59 ml/min ([place]) 11/27/2011   HYPERKALEMIA 11/30/2009   INSOMNIA 11/21/2009   FATIGUE 11/21/2009   Diabetes mellitus with renal manifestation ([place]) 07/25/2009   Essential hypertension 07/25/2009   COLONIC POLYPS, HX OF 07/25/2009    Immunization History  Administered Date(s) Administered   Influenza Split 06/03/2012   Influenza,inj,Quad PF,6+ Mos 08/10/2013, 05/03/2014, 05/06/2017, 07/15/2018, 05/18/2019   PFIZER Comirnaty(Gray Top)Covid-19 Tri-Sucrose Vaccine 09/18/2019, 10/11/2019   PFIZER(Purple Top)SARS-COV-2 Vaccination 09/18/2019, 10/11/2019   PPD Test 04/19/2013   Pneumococcal  Conjugate-13 10/02/2014   Pneumococcal Polysaccharide-23 04/25/2013   Td 08/19/2007    Conditions to be addressed/monitored:  Hypertension, Hyperlipidemia, Diabetes, GERD, Anxiety, and Osteopenia  Care Plan : [place]  Updates made by Charlton Haws, [place] since 07/24/2021 12:00 AM     Problem: Hypertension, Hyperlipidemia, Diabetes, GERD, Anxiety, and Osteopenia      Long-Range Goal: Disease mgmt   Start Date: 07/19/2021  Expected End Date: 07/19/2022  This Visit's Progress: On track  Priority: High  Note:   Current Barriers:  Unable to independently monitor therapeutic efficacy Unable to achieve control of Diabetes  Does not adhere to prescribed medication regimen  Pharmacist Clinical Goal(s):  Patient will achieve adherence to monitoring guidelines and medication adherence to achieve therapeutic efficacy achieve improvement in diabetes as evidenced by A1c < 8% adhere to prescribed medication regimen as evidenced by pt report through collaboration with PharmD and provider.   Interventions: 1:1 collaboration with Lesleigh Noe, MD regarding development and update of comprehensive plan of care as evidenced by provider attestation and co-signature Inter-disciplinary care team collaboration (see longitudinal plan of care) Comprehensive medication review performed; medication list updated in electronic medical record  Hypertension (BP goal <130/80) -Controlled - not checking BP; does not endorse dizziness/lightheadedness; previously PCP advised to reduce dose to 5 mg but pt is currently taking full 10 mg -Current treatment: Lisinopril 10 mg daily -Denies hypotensive/hypertensive symptoms -Educated on BP goals and benefits of medications for prevention of heart attack, stroke and kidney damage; Importance of home blood pressure monitoring; -Counseled to monitor BP at home weekly, document, and  provide log at future appointments -Recommended to continue  current medication  Hyperlipidemia: (LDL goal < 100) -Controlled - LDL is at goal; pt endorses compliance with statin -Current treatment: Pravastatin 20 mg daily -Educated on Cholesterol goals; Benefits of statin for ASCVD risk reduction; -Recommended to continue current medication  Diabetes (A1c goal <8%) -Uncontrolled - A1c is above goal, but medications were adjusted since last check; home BG readings fluctuate; pt has been to a nutritionist more than once -Pt reports metformin 3 tab/day is causing GI upset/diarrhea (she could not tolerate 4/day at all); she is unable to separate dose into BID because she takes all meds at once once a day and struggles with remembering to do that; she reports missed doses of all meds 1-2 times per week and she feels better on the days she misses her meds -Pt is due for repeat A1c, PCP f/u visit -Current home glucose readings fasting glucose: 142, 136 post prandial glucose: 130 (3 hr PP), 236 (after donuts) -Current medications: Jardiance 25 mg daily Metformin ER 500 mg - taking 3 tab daily Glipizide XL 10 mg daily Testing supplies -Medications previously tried: Januvia, Rybelsus -Denies hypoglycemic/hyperglycemic symptoms -Current meal patterns: sometimes "eats for comfort" -Educated on A1c and blood sugar goals; Complications of diabetes including kidney damage, retinal damage, and cardiovascular disease; -Discussed glipizide reduced efficacy over time; pt has been on this on-and-off for 10 years, likely little clinical benefit at this point; consider switch to GLP-1 in future -Discussed CGM - will not be covered by ins because she is not on insulin, can consider brief trial for cash price for pt education/nutrition changes -Recommended to reduce metformin to 1000 mg/day and work on daily adherence to medication - hopefully taking more consistently will offset lower dose  Anxiety (Goal: manage symptoms) -Controlled - pt is trying to cut back on  buspar use; she is not taking every day and reports mood/anxiety is stable -Current treatment  Buspirone 15 mg - 1/2 tab BID -Counseled it is reasonable to try using buspar less -Recommended to continue current medication  Osteopenia (Goal prevent fractures) -Controlled -Last DEXA Scan: 12/25/20   T-Score femoral neck: -1.5  T-Score total hip: -0.8  T-Score lumbar spine: -0.6  10-year probability of major osteoporotic fracture: 16%  10-year probability of hip fracture: 2.6% -Patient is not a candidate for pharmacologic treatment -Current treatment  None -Recommend [phone] units of vitamin D daily. Recommend 1200 mg of calcium daily from dietary and supplemental sources. Recommend weight-bearing and muscle strengthening exercises for building and maintaining bone density.  GERD (Goal: manage symptoms) -Controlled - pt has tried time off of PPI but symptoms always return after a few days; currently manageable with every-other-day dosing -Current treatment  Omeprazole 20 mg daily PRN - taking QOD -Discussed chronic PPI is not ideal given osteopenia, but pt agrees benefits currently outweigh risks -Recommended to continue current medication  Health Maintenance -Vaccine gaps: Flu, covid booster, Shingrix, TD booster -Current therapy:  Tylenol 325 mg PRN Methocarbamol 500 mg PRN - not taking -Patient is satisfied with current therapy and denies issues -Recommended to continue current medication  Patient Goals/Self-Care Activities Patient will:  - take medications as prescribed as evidenced by patient report and record review focus on medication adherence by routine check blood pressure weekly, document, and provide at future appointments check blood sugar daily, document, and provide at future appointments -may reduce metformin to 1000 mg/day (2 tablets) AS LONG AS it is taken every day consistently  Medication Assistance: None required.  Patient affirms current coverage  meets needs.  Compliance/Adherence/Medication fill history: Care Gaps: Cologuard (due 04/26/21) AWV due  Star-Rating Drugs: Medication:                Last Fill:         Day Supply Pravastatin 20 mg       06/05/2021      90 Jardiance 25 mg         04/18/2021      90         Glipizide 10 mg           04/18/2021      90 Metformin 2000 mg     04/09/2021      90 Lisinopril 10 mg           07/10/2021      90 Fill dates verified with [facility]   Patient's preferred pharmacy is:  [facility], [address] [place] [address] Phone: [phone] Fax: [phone]  Uses pill box? Yes Pt endorses 80% compliance  We discussed: Current pharmacy is preferred with insurance plan and patient is satisfied with pharmacy services Patient decided to: Continue current medication management strategy  Care Plan and Follow Up Patient Decision:  Patient agrees to Care Plan and Follow-up.  Plan: Telephone follow up appointment with care management team member scheduled for:  1 month  Charlene Brooke, PharmD, [facility] Clinical Pharmacist [facility] [phone]

## 2021-07-23 DIAGNOSIS — N1831 Chronic kidney disease, stage 3a: Secondary | ICD-10-CM | POA: Diagnosis not present

## 2021-07-23 DIAGNOSIS — E1122 Type 2 diabetes mellitus with diabetic chronic kidney disease: Secondary | ICD-10-CM | POA: Diagnosis not present

## 2021-07-24 NOTE — Patient Instructions (Signed)
Visit Information  Phone number for Pharmacist: [phone]  Thank you for meeting with me to discuss your medications! I look forward to working with you to achieve your health care goals. Below is a summary of what we talked about during the visit:   Goals Addressed             This Visit's Progress    Monitor and Manage My Blood Sugar-Diabetes Type 2       Timeframe:  Long-Range Goal Priority:  High Start Date:   07/24/21                          Expected End Date: 07/24/22                      Follow Up Date Jan 2023   - check blood sugar at prescribed times - check blood sugar if I feel it is too high or too low - take the blood sugar meter to all doctor visits    Why is this important?   Checking your blood sugar at home helps to keep it from getting very high or very low.  Writing the results in a diary or log helps the doctor know how to care for you.  Your blood sugar log should have the time, date and the results.  Also, write down the amount of insulin or other medicine that you take.  Other information, like what you ate, exercise done and how you were feeling, will also be helpful.     Notes:         Care Plan : [place]  Updates made by Charlton Haws, RPH since 07/24/2021 12:00 AM     Problem: Hypertension, Hyperlipidemia, Diabetes, GERD, Anxiety, and Osteopenia      Long-Range Goal: Disease mgmt   Start Date: 07/19/2021  Expected End Date: 07/19/2022  This Visit's Progress: On track  Priority: High  Note:   Current Barriers:  Unable to independently monitor therapeutic efficacy Unable to achieve control of Diabetes  Does not adhere to prescribed medication regimen  Pharmacist Clinical Goal(s):  Patient will achieve adherence to monitoring guidelines and medication adherence to achieve therapeutic efficacy achieve improvement in diabetes as evidenced by A1c < 8% adhere to prescribed medication regimen as evidenced by pt  report through collaboration with PharmD and provider.   Interventions: 1:1 collaboration with Lesleigh Noe, MD regarding development and update of comprehensive plan of care as evidenced by provider attestation and co-signature Inter-disciplinary care team collaboration (see longitudinal plan of care) Comprehensive medication review performed; medication list updated in electronic medical record  Hypertension (BP goal <130/80) -Controlled - not checking BP; does not endorse dizziness/lightheadedness; previously PCP advised to reduce dose to 5 mg but pt is currently taking full 10 mg -Current treatment: Lisinopril 10 mg daily -Denies hypotensive/hypertensive symptoms -Educated on BP goals and benefits of medications for prevention of heart attack, stroke and kidney damage; Importance of home blood pressure monitoring; -Counseled to monitor BP at home weekly, document, and provide log at future appointments -Recommended to continue current medication  Hyperlipidemia: (LDL goal < 100) -Controlled - LDL is at goal; pt endorses compliance with statin -Current treatment: Pravastatin 20 mg daily -Educated on Cholesterol goals; Benefits of statin for ASCVD risk reduction; -Recommended to continue current medication  Diabetes (A1c goal <8%) -Uncontrolled - A1c is above goal, but medications were adjusted since last check; home  BG readings fluctuate; pt has been to a nutritionist more than once -Pt reports metformin 3 tab/day is causing GI upset/diarrhea (she could not tolerate 4/day at all); she is unable to separate dose into BID because she takes all meds at once once a day and struggles with remembering to do that; she reports missed doses of all meds 1-2 times per week and she feels better on the days she misses her meds -Pt is due for repeat A1c, PCP f/u visit -Current home glucose readings fasting glucose: 142, 136 post prandial glucose: 130 (3 hr PP), 236 (after donuts) -Current  medications: Jardiance 25 mg daily Metformin ER 500 mg - taking 3 tab daily Glipizide XL 10 mg daily Testing supplies -Medications previously tried: Januvia, Rybelsus -Denies hypoglycemic/hyperglycemic symptoms -Current meal patterns: sometimes "eats for comfort" -Educated on A1c and blood sugar goals; Complications of diabetes including kidney damage, retinal damage, and cardiovascular disease; -Discussed glipizide reduced efficacy over time; pt has been on this on-and-off for 10 years, likely little clinical benefit at this point; consider switch to GLP-1 in future -Discussed CGM - will not be covered by ins because she is not on insulin, can consider brief trial for cash price for pt education/nutrition changes -Recommended to reduce metformin to 1000 mg/day and work on daily adherence to medication - hopefully taking more consistently will offset lower dose  Anxiety (Goal: manage symptoms) -Controlled - pt is trying to cut back on buspar use; she is not taking every day and reports mood/anxiety is stable -Current treatment  Buspirone 15 mg - 1/2 tab BID -Counseled it is reasonable to try using buspar less -Recommended to continue current medication  Osteopenia (Goal prevent fractures) -Controlled -Last DEXA Scan: 12/25/20   T-Score femoral neck: -1.5  T-Score total hip: -0.8  T-Score lumbar spine: -0.6  10-year probability of major osteoporotic fracture: 16%  10-year probability of hip fracture: 2.6% -Patient is not a candidate for pharmacologic treatment -Current treatment  None -Recommend [phone] units of vitamin D daily. Recommend 1200 mg of calcium daily from dietary and supplemental sources. Recommend weight-bearing and muscle strengthening exercises for building and maintaining bone density.  GERD (Goal: manage symptoms) -Controlled - pt has tried time off of PPI but symptoms always return after a few days; currently manageable with every-other-day dosing -Current  treatment  Omeprazole 20 mg daily PRN - taking QOD -Discussed chronic PPI is not ideal given osteopenia, but pt agrees benefits currently outweigh risks -Recommended to continue current medication  Health Maintenance -Vaccine gaps: Flu, covid booster, Shingrix, TD booster -Current therapy:  Tylenol 325 mg PRN Methocarbamol 500 mg PRN - not taking -Patient is satisfied with current therapy and denies issues -Recommended to continue current medication  Patient Goals/Self-Care Activities Patient will:  - take medications as prescribed as evidenced by patient report and record review focus on medication adherence by routine check blood pressure weekly, document, and provide at future appointments check blood sugar daily, document, and provide at future appointments -may reduce metformin to 1000 mg/day (2 tablets) AS LONG AS it is taken every day consistently      Ms. Deahl was given information about Chronic Care Management services today including:  CCM service includes personalized support from designated clinical staff supervised by her physician, including individualized plan of care and coordination with other care providers 24/7 contact phone numbers for assistance for urgent and routine care needs. Standard insurance, coinsurance, copays and deductibles apply for chronic care management only during months in which  we provide at least 20 minutes of these services. Most insurances cover these services at 100%, however patients may be responsible for any copay, coinsurance and/or deductible if applicable. This service may help you avoid the need for more expensive face-to-face services. Only one practitioner may furnish and bill the service in a calendar month. The patient may stop CCM services at any time (effective at the end of the month) by phone call to the office staff.  Patient agreed to services and verbal consent obtained.   Patient verbalizes understanding of instructions  provided today and agrees to view in [place].  Telephone follow up appointment with pharmacy team member scheduled for: 1 month  Charlene Brooke, PharmD, [facility] Clinical Pharmacist [facility] [phone]

## 2021-07-30 ENCOUNTER — Encounter: Payer: Self-pay | Admitting: Family Medicine

## 2021-08-02 ENCOUNTER — Encounter: Payer: Self-pay | Admitting: Internal Medicine

## 2021-08-03 DIAGNOSIS — E1165 Type 2 diabetes mellitus with hyperglycemia: Secondary | ICD-10-CM

## 2021-08-03 DIAGNOSIS — E785 Hyperlipidemia, unspecified: Secondary | ICD-10-CM | POA: Diagnosis not present

## 2021-08-03 DIAGNOSIS — I10 Essential (primary) hypertension: Secondary | ICD-10-CM | POA: Diagnosis not present

## 2021-08-03 DIAGNOSIS — E1169 Type 2 diabetes mellitus with other specified complication: Secondary | ICD-10-CM | POA: Diagnosis not present

## 2021-08-03 DIAGNOSIS — Z7984 Long term (current) use of oral hypoglycemic drugs: Secondary | ICD-10-CM

## 2021-08-14 ENCOUNTER — Telehealth: Payer: Self-pay

## 2021-08-14 NOTE — Progress Notes (Signed)
° ° °  Chronic Care Management Pharmacy Assistant   Name: Wanda [name]  MRN: [id number] DOB: Feb 21, 1946  Reason for Encounter: CCM (Appointment Reminder)  Medications: Outpatient Encounter Medications as of 08/14/2021  Medication Sig   acetaminophen (TYLENOL) 325 MG tablet Take 2 tablets by mouth as needed.   busPIRone (BUSPAR) 15 MG tablet Take 0.5 tablets (7.5 mg total) by mouth 2 (two) times daily. (Patient taking differently: Take 7.5 mg by mouth 2 (two) times daily. PRN)   empagliflozin (JARDIANCE) 25 MG TABS tablet Take 1 tablet (25 mg total) by mouth daily before breakfast.   glipiZIDE (GLUCOTROL XL) 10 MG 24 hr tablet Take 1 tablet (10 mg total) by mouth daily with breakfast.   glucose blood (ACCU-CHEK AVIVA PLUS) test strip Use as instructed to test blood sugar 2 times daily E11.45   lisinopril (ZESTRIL) 10 MG tablet TAKE 1 TABLET BY MOUTH DAILY.   metFORMIN (GLUCOPHAGE-XR) 500 MG 24 hr tablet TAKE 4 TABLETS (2,000 MG TOTAL) BY MOUTH DAILY WITH BREAKFAST. (Patient taking differently: Take 1,500 mg by mouth daily with breakfast. (3 tablets))   omeprazole (PRILOSEC) 20 MG capsule TAKE 1 CAPSULE BY MOUTH DAILY AS NEEDED. (Patient taking differently: Take 20 mg by mouth every other day.)   pravastatin (PRAVACHOL) 20 MG tablet TAKE 1 TABLET BY MOUTH DAILY WITH EVENING MEAL   No facility-administered encounter medications on file as of 08/14/2021.   [name] was contacted to remind of upcoming telephone visit with Charlene Brooke on 08/28/2021 at 4:00 pm. Patient was reminded to have all medications, supplements and any blood glucose and blood pressure readings available for review at appointment. If unable to reach, a voicemail was left for patient. Patient had to reschedule her appointment on 08/20/2021 at 3:30 pm to 08/28/2021 at 4:00 pm.   Are you having any problems with your medications? No   Do you have any concerns you like to discuss with the pharmacist? No  Star Rating  Drugs: Medication:  Last Fill: Day Supply Pravastatin 20 mg 06/05/2021 90 Jardiance 25 mg 04/18/2021 90 Glipizide 10 mg 04/18/2021 90 Metformin 500 mg 04/09/2021 60 Lisinopril 10 mg 07/10/2021 90 Verified with [place], CPP notified  Marijean Niemann, [place] [phone]  Time Spent: 15 Minutes

## 2021-08-20 ENCOUNTER — Telehealth: Payer: Medicare PPO

## 2021-08-21 NOTE — Progress Notes (Signed)
° ° °  Chronic Care Management Pharmacy Assistant   Name: Wanda [name]  MRN: [id number] DOB: 07-Nov-1945  Reason for Encounter: CCM (Appointment Reminder)   Wanda [name] was contacted to remind of upcoming telephone visit with Charlene Brooke on 08/28/2020 at 4:00. Patient was reminded to have all medications, supplements and any blood glucose and blood pressure readings available for review at appointment. If unable to reach, a voicemail was left for patient.   Star Rating Drugs: Medication:                Last Fill:         Day Supply Pravastatin 20 mg       06/05/2021      90 Jardiance 25 mg  04/18/2021      90 Glipizide 10 mg           04/18/2021      90 Metformin 500 mg       04/09/2021      60 Lisinopril 10 mg           07/10/2021      [place], CPP notified  Marijean Niemann, [place] Clinical Pharmacy Assistant [phone]  Time Spent: 10 Minutes

## 2021-08-28 ENCOUNTER — Other Ambulatory Visit: Payer: Self-pay

## 2021-08-28 ENCOUNTER — Ambulatory Visit (INDEPENDENT_AMBULATORY_CARE_PROVIDER_SITE_OTHER): Payer: Medicare PPO | Admitting: Pharmacist

## 2021-08-28 DIAGNOSIS — K219 Gastro-esophageal reflux disease without esophagitis: Secondary | ICD-10-CM

## 2021-08-28 DIAGNOSIS — I10 Essential (primary) hypertension: Secondary | ICD-10-CM

## 2021-08-28 DIAGNOSIS — E1165 Type 2 diabetes mellitus with hyperglycemia: Secondary | ICD-10-CM

## 2021-08-28 DIAGNOSIS — E785 Hyperlipidemia, unspecified: Secondary | ICD-10-CM

## 2021-08-28 NOTE — Progress Notes (Signed)
Chronic Care Management Pharmacy Note  08/28/2021 Name:  Wanda [name] MRN:  [id number] DOB:  [birth year]  Summary: -1 month follow up: pt reports reducing metformin to 1000 mg/day has significantly improved GI symptoms; BG is slightly worse (fasting 150-190, post-prandial 180-240) -Pt has been on glipizide on-and-off for ~10 years; benefit is likely wearing off at this point. Discussed possible medication change in future and pt is amenable to switching to a GLP-1 injection  Recommendations/Changes made from today's visit: -Scheduled PCP f/u 2/28 - if A1c >8%,  recommended to change glipizide to Trulicity 3.55 mg weekly  Plan: -PCP f/u appt 10/01/21 -Pharmacist follow up televisit scheduled for 2 months   Subjective: Wanda [name] is an 76 y.o. year old female who is a primary patient of Cody, Jobe Marker, MD.  The CCM team was consulted for assistance with disease management and care coordination needs.    Engaged with patient by telephone for follow up visit in response to provider referral for pharmacy case management and/or care coordination services.   Consent to Services:  The patient was given information about Chronic Care Management services, agreed to services, and gave verbal consent prior to initiation of services.  Please see initial visit note for detailed documentation.   Patient Care Team: Wanda Noe, MD as PCP - General (Family Medicine) Katy Apo, MD as Consulting Physician (Ophthalmology) [facility], Melanie Crazier, MD as Consulting Physician (Endocrinology) Willia Craze, NP as Nurse Practitioner (Gastroenterology) Wanda [name], [facility] as Pharmacist (Pharmacist)  Recent office visits: 05/23/2021 - Wanda Schooner, MD - Patient presented for follow up for diabetes. Continue Buspar 7.5 mg BID due to significant improvement. Change: Decrease Lisinopril 10 > 75m due to orthostatic hypotension.   04/18/2021 - Wanda Schooner MD - Patient presented for  diabetes follow up. Referral to Gastroenterology due to nausea and difficulty swallowing. Change: switch glipizide 5 mg bid to glipizide XL 10 mg daily. Change: Increase jardiance to 25 mg. Change: Increase buspar 3.75 > 7.5 mg and to 15 mg daily if no improvement after 2 weeks for anxiety.   Recent consult visits: 07/23/21 Dr Wanda [name](Nephrology): f/u CKD. Stable. No changes. F/U 1 year.  04/02/2021 - SJohny Drilling RN - Nutrition - Patient presented for diabetes. Recommended: Check blood sugars 1 x day before breakfast or 2 hrs after supper every day.   02/25/2021 - Wanda Needle RN - Urology - Telephone - Start: cephalexin ([facility] 500 MG capsule for 5 days.   02/20/2021 - Wanda [name]- Urology - Patient presented for urinary frequency and prolapse of vaginal wall. No other information provided.   02/19/2021 - SJohny Drilling RN - Nutrition - Patient presented for diabetes. Recommended: Eat 3 meals day, 1-2  snacks a day, space meals 4-6 hours apart, don't skip meals and limit fried foods, desserts and sweets. Complete 3 Day Food Record and bring to next appt.   Hospital visits: None in previous 6 months   Objective:  Lab Results  Component Value Date   CREATININE 1.0 07/16/2021   BUN 22 (A) 07/16/2021   GFR 61.34 07/06/2020   GFRNONAA 57 (L) 06/23/2014   GFRAA 67 (L) 06/23/2014   NA 142 07/16/2021   K 4.1 07/16/2021   CALCIUM 9.9 07/16/2021   CO2 26 (A) 07/16/2021   GLUCOSE 176 (H) 07/06/2020    Lab Results  Component Value Date/Time   HGBA1C 8.6 (A) 04/18/2021 11:49 AM   HGBA1C 9.4 (A) 01/08/2021 10:20 AM   HGBA1C 9.3 (H)  07/06/2020 02:33 PM   HGBA1C 8.0 (H) 08/23/2019 02:36 PM   HGBA1C 7.7 08/25/2018 10:28 AM   GFR 61.34 07/06/2020 02:33 PM   GFR 57.16 (L) 08/23/2019 02:36 PM   GFR 57.16 (L) 08/23/2019 02:36 PM   MICROALBUR 1.3 10/02/2014 03:01 PM   MICROALBUR 1.9 08/05/2013 09:20 AM    Last diabetic Eye exam:  Lab Results  Component Value Date/Time    HMDIABEYEEXA No Retinopathy 10/09/2020 12:00 AM    Last diabetic Foot exam: No results found for: HMDIABFOOTEX   Lab Results  Component Value Date   CHOL 181 07/06/2020   HDL 39.30 07/06/2020   LDLCALC 84 05/05/2017   LDLDIRECT 96.0 07/06/2020   TRIG 279.0 (H) 07/06/2020   CHOLHDL 5 07/06/2020    Hepatic Function Latest Ref Rng & Units 07/16/2021 07/06/2020 08/23/2019  Total Protein 6.0 - 8.3 g/dL - 7.4 7.3  Albumin 3.5 - 5.0 4.3 4.2 4.1  AST 0 - 37 U/L - 32 21  ALT 0 - 35 U/L - 34 27  Alk Phosphatase 39 - 117 U/L - 90 85  Total Bilirubin 0.2 - 1.2 mg/dL - 0.4 0.3  Bilirubin, Direct 0.0 - 0.3 mg/dL - - -    Lab Results  Component Value Date/Time   TSH 3.57 08/23/2019 02:36 PM   TSH 4.36 05/18/2019 01:32 PM    CBC Latest Ref Rng & Units 07/16/2021 07/06/2020 08/23/2019  WBC - 7.2 7.8 7.2  Hemoglobin 12.0 - 16.0 13.2 12.8 11.7(L)  Hematocrit 36 - 46 40 38.8 36.0  Platelets 150 - 399 283 291.0 245.0    Lab Results  Component Value Date/Time   VD25OH 21.70 (L) 08/23/2019 02:36 PM    Clinical ASCVD: No  The 10-year ASCVD risk score (Arnett DK, et al., 2019) is: 34.8%   Values used to calculate the score:     Age: 76 years     Sex: Female     Is Non-Hispanic African American: No     Diabetic: Yes     Tobacco smoker: No     Systolic Blood Pressure: 628 mmHg     Is BP treated: Yes     HDL Cholesterol: 39.3 mg/dL     Total Cholesterol: 181 mg/dL    Depression screen [facility] 2/9 05/23/2021 02/19/2021 08/22/2019  Decreased Interest 1 0 0  Down, Depressed, Hopeless 0 0 0  PHQ - 2 Score 1 0 0  Altered sleeping 1 - -  Tired, decreased energy 1 - -  Change in appetite 1 - -  Feeling bad or failure about yourself  0 - -  Trouble concentrating 0 - -  Moving slowly or fidgety/restless 0 - -  Suicidal thoughts 0 - -  PHQ-9 Score 4 - -  Difficult doing work/chores Not difficult at all - -  Some recent data might be hidden     Social History   Tobacco Use  Smoking Status  Never  Smokeless Tobacco Never   BP Readings from Last 3 Encounters:  05/23/21 118/80  04/18/21 130/72  04/02/21 118/72   Pulse Readings from Last 3 Encounters:  05/23/21 79  04/18/21 77  01/08/21 65   Wt Readings from Last 3 Encounters:  05/23/21 137 lb 8 oz (62.4 kg)  04/18/21 136 lb 4 oz (61.8 kg)  04/02/21 137 lb 6.4 oz (62.3 kg)   BMI Readings from Last 3 Encounters:  05/23/21 23.60 kg/m  04/18/21 23.39 kg/m  04/02/21 23.58 kg/m    Assessment/Interventions: Review  of patient past medical history, allergies, medications, health status, including review of consultants reports, laboratory and other test data, was performed as part of comprehensive evaluation and provision of chronic care management services.   SDOH:  (Social Determinants of Health) assessments and interventions performed: Yes   SDOH Screenings   Alcohol Screen: Not on file  Depression (PHQ2-9): Low Risk    PHQ-2 Score: 4  Financial Resource Strain: Low Risk    Difficulty of Paying Living Expenses: Not very hard  Food Insecurity: Not on file  Housing: Not on file  Physical Activity: Not on file  Social Connections: Not on file  Stress: Not on file  Tobacco Use: Low Risk    Smoking Tobacco Use: Never   Smokeless Tobacco Use: Never   Passive Exposure: Not on file  Transportation Needs: Not on file    [place]  Allergies  Allergen Reactions   Niacin     REACTION: unbearable hot flashes   Sulfamethoxazole-Trimethoprim Itching    Medications Reviewed Today     Reviewed by Wanda [name], [facility] (Pharmacist) on 08/28/21 at [place] List Status: <None>   Medication Order Taking? Sig Documenting Provider Last Dose Status Informant  acetaminophen (TYLENOL) 325 MG tablet [id number] Yes Take 2 tablets by mouth as needed. [provider] Taking Active   busPIRone (BUSPAR) 15 MG tablet [id number] Yes Take 0.5 tablets (7.5 mg total) by mouth 2 (two) times daily.  Patient taking  differently: Take 7.5 mg by mouth 2 (two) times daily. PRN   Wanda Noe, MD Taking Active   empagliflozin (JARDIANCE) 25 MG TABS tablet [id number] Yes Take 1 tablet (25 mg total) by mouth daily before breakfast. Wanda Noe, MD Taking Active   glipiZIDE (GLUCOTROL XL) 10 MG 24 hr tablet [id number] Yes Take 1 tablet (10 mg total) by mouth daily with breakfast. Wanda Noe, MD Taking Active   glucose blood (ACCU-CHEK AVIVA PLUS) test strip [id number] Yes Use as instructed to test blood sugar 2 times daily E11.45 Shamleffer, Melanie Crazier, MD Taking Active   lisinopril (ZESTRIL) 10 MG tablet [id number] Yes TAKE 1 TABLET BY MOUTH DAILY. Wanda Noe, MD Taking Active   metFORMIN (GLUCOPHAGE-XR) 500 MG 24 hr tablet [id number] Yes TAKE 4 TABLETS (2,000 MG TOTAL) BY MOUTH DAILY WITH BREAKFAST.  Patient taking differently: Take 1,000 mg by mouth daily with breakfast. (2 tablets)   Wanda Noe, MD Taking Active   omeprazole (PRILOSEC) 20 MG capsule [id number] Yes TAKE 1 CAPSULE BY MOUTH DAILY AS NEEDED.  Patient taking differently: Take 20 mg by mouth every other day.   Wanda Noe, MD Taking Active   pravastatin (PRAVACHOL) 20 MG tablet [id number] Yes TAKE 1 TABLET BY MOUTH DAILY WITH EVENING MEAL Wanda Noe, MD Taking Active             Patient Active Problem List   Diagnosis Date Noted   Postural dizziness 05/23/2021   Generalized anxiety disorder 04/18/2021   Muscle spasm 04/18/2021   Diarrhea 01/08/2021   Osteopenia 01/01/2021   Thumb pain, left 08/21/2020   Shortness of breath 07/09/2020   Tinnitus of both ears 07/09/2020   Type 2 diabetes mellitus with hyperglycemia, without long-term current use of insulin ([place]) 10/03/2019   Type 2 diabetes mellitus with stage 3a chronic kidney disease, without long-term current use of insulin ([place]) 10/03/2019   Disease related peripheral neuropathy 08/22/2019   Urinary frequency 05/18/2019   Stress incontinence of urine  02/21/2019   Grief 07/15/2018   Pelvic prolapse 01/22/2017   Hyperlipidemia associated with type 2 diabetes mellitus ([place]) 05/03/2014   GERD (gastroesophageal reflux disease) 12/05/2013   CKD (chronic kidney disease) stage 3, GFR 30-59 ml/min ([place]) 11/27/2011   HYPERKALEMIA 11/30/2009   INSOMNIA 11/21/2009   FATIGUE 11/21/2009   Diabetes mellitus with renal manifestation ([place]) 07/25/2009   Essential hypertension 07/25/2009   COLONIC POLYPS, HX OF 07/25/2009    Immunization History  Administered Date(s) Administered   Influenza Split 06/03/2012   Influenza,inj,Quad PF,6+ Mos 08/10/2013, 05/03/2014, 05/06/2017, 07/15/2018, 05/18/2019   PFIZER Comirnaty(Gray Top)Covid-19 Tri-Sucrose Vaccine 09/18/2019, 10/11/2019   PFIZER(Purple Top)SARS-COV-2 Vaccination 09/18/2019, 10/11/2019   PPD Test 04/19/2013   Pneumococcal Conjugate-13 10/02/2014   Pneumococcal Polysaccharide-23 04/25/2013   Td 08/19/2007    Conditions to be addressed/monitored:  Hypertension, Hyperlipidemia, Diabetes, GERD, Anxiety, and Osteopenia  Care Plan : [facility]  Updates made by Wanda [name], [place] since 08/28/2021 12:00 AM     Problem: Hypertension, Hyperlipidemia, Diabetes, GERD, Anxiety, and Osteopenia      Long-Range Goal: Disease mgmt   Start Date: 07/19/2021  Expected End Date: 07/19/2022  This Visit's Progress: On track  Recent Progress: On track  Priority: High  Note:   Current Barriers:  Unable to independently monitor therapeutic efficacy Unable to achieve control of Diabetes  Does not adhere to prescribed medication regimen  Pharmacist Clinical Goal(s):  Patient will achieve adherence to monitoring guidelines and medication adherence to achieve therapeutic efficacy achieve improvement in diabetes as evidenced by A1c < 8% adhere to prescribed medication regimen as evidenced by pt report through collaboration with PharmD and provider.   Interventions: 1:1 collaboration  with Wanda Noe, MD regarding development and update of comprehensive plan of care as evidenced by provider attestation and co-signature Inter-disciplinary care team collaboration (see longitudinal plan of care) Comprehensive medication review performed; medication list updated in electronic medical record  Hypertension (BP goal <130/80) -Controlled - pt reports BP at home is at goal, denies low BP on full dose of lisinopril -Home BP: 129/73, 118/67,  -Current treatment: Lisinopril 10 mg daily -Denies hypotensive/hypertensive symptoms -Educated on BP goals and benefits of medications for prevention of heart attack, stroke and kidney damage; Importance of home blood pressure monitoring; -Counseled to monitor BP at home weekly -Recommended to continue current medication  Hyperlipidemia: (LDL goal < 100) -Controlled - LDL is at goal; pt endorses compliance with statin -Current treatment: Pravastatin 20 mg daily -Educated on Cholesterol goals; Benefits of statin for ASCVD risk reduction; -Recommended to continue current medication  Diabetes (A1c goal <8%) -Uncontrolled - A1c is above goal, but medications were adjusted since last check; home BG readings fluctuate; pt has been to a nutritionist more than once -Pt reports reducing metformin to 1000 mg/day has improved diarrhea side effect; she reports BG has been more elevated -Pt is due for repeat A1c, PCP f/u visit -Current home glucose readings fasting glucose: 159-200 post prandial glucose: 180-240 -Current medications: Jardiance 25 mg daily Metformin ER 500 mg - taking 2 tablet Glipizide XL 10 mg daily Testing supplies -Medications previously tried: Januvia, Rybelsus -Denies hypoglycemic/hyperglycemic symptoms -Current meal patterns: sometimes "eats for comfort" -Educated on A1c and blood sugar goals; Complications of diabetes including kidney damage, retinal damage, and cardiovascular disease; -Discussed glipizide reduced  efficacy over time; pt has been on this on-and-off for 10 years, likely little clinical benefit at this point -Discussed benefits of GLP-1 agonists; discussed injection technique; pt is  amenable to trial of Trulicity if needed -Scheduled PCP f/u for 2/28; if A1c > 8% recommend stopping glipizide and starting Trulicity 9.82 mg weekly  Osteopenia (Goal prevent fractures) - not addressed 08/28/21 -Controlled -Last DEXA Scan: 12/25/20   T-Score femoral neck: -1.5  T-Score total hip: -0.8  T-Score lumbar spine: -0.6  10-year probability of major osteoporotic fracture: 16%  10-year probability of hip fracture: 2.6% -Patient is not a candidate for pharmacologic treatment -Current treatment  None -Recommend [phone] units of vitamin D daily. Recommend 1200 mg of calcium daily from dietary and supplemental sources. Recommend weight-bearing and muscle strengthening exercises for building and maintaining bone density.  Health Maintenance -Vaccine gaps: Flu, covid booster, Shingrix, TD booster  Patient Goals/Self-Care Activities Patient will:  - take medications as prescribed as evidenced by patient report and record review focus on medication adherence by routine check blood pressure weekly, document, and provide at future appointments check blood sugar daily, document, and provide at future appointments -Keep PCP appt 10/01/21 @ 12:40 pm       Medication Assistance: None required.  Patient affirms current coverage meets needs.  Compliance/Adherence/Medication fill history: Care Gaps: Cologuard (due 04/26/21) AWV due  Star-Rating Drugs: Medication:                Last Fill:         Day Supply Pravastatin 20 mg       06/05/2021      90 Jardiance 25 mg         04/18/2021      90         Glipizide 10 mg           04/18/2021      90 Metformin 2000 mg     04/09/2021      90 Lisinopril 10 mg           07/10/2021      90 Fill dates verified with [facility] (Council date  unavailable)  Patient's preferred pharmacy is:  [place] [place], [place] [address] Phone: [phone] Fax: [phone]  Uses pill box? Yes Pt endorses 80% compliance  We discussed: Current pharmacy is preferred with insurance plan and patient is satisfied with pharmacy services Patient decided to: Continue current medication management strategy  Care Plan and Follow Up Patient Decision:  Patient agrees to Care Plan and Follow-up.  Plan: Telephone follow up appointment with care management team member scheduled for:  2 months  Charlene Brooke, PharmD, [facility] Clinical Pharmacist [place] [facility] at [facility] [phone]

## 2021-08-28 NOTE — Patient Instructions (Addendum)
Visit Information  Phone number for Pharmacist: [phone]   Goals Addressed             This Visit's Progress    Monitor and Manage My Blood Sugar-Diabetes Type 2       Timeframe:  Long-Range Goal Priority:  High Start Date:   07/24/21                          Expected End Date: 07/24/22                      Follow Up Date March 2023   - check blood sugar at prescribed times - check blood sugar if I feel it is too high or too low - take the blood sugar meter to all doctor visits    Why is this important?   Checking your blood sugar at home helps to keep it from getting very high or very low.  Writing the results in a diary or log helps the doctor know how to care for you.  Your blood sugar log should have the time, date and the results.  Also, write down the amount of insulin or other medicine that you take.  Other information, like what you ate, exercise done and how you were feeling, will also be helpful.     Notes:         Care Plan : [place]  Updates made by Charlton Haws, RPH since 08/28/2021 12:00 AM     Problem: Hypertension, Hyperlipidemia, Diabetes, GERD, Anxiety, and Osteopenia      Long-Range Goal: Disease mgmt   Start Date: 07/19/2021  Expected End Date: 07/19/2022  This Visit's Progress: On track  Recent Progress: On track  Priority: High  Note:   Current Barriers:  Unable to independently monitor therapeutic efficacy Unable to achieve control of Diabetes  Does not adhere to prescribed medication regimen  Pharmacist Clinical Goal(s):  Patient will achieve adherence to monitoring guidelines and medication adherence to achieve therapeutic efficacy achieve improvement in diabetes as evidenced by A1c < 8% adhere to prescribed medication regimen as evidenced by pt report through collaboration with PharmD and provider.   Interventions: 1:1 collaboration with Lesleigh Noe, MD regarding development and update of  comprehensive plan of care as evidenced by provider attestation and co-signature Inter-disciplinary care team collaboration (see longitudinal plan of care) Comprehensive medication review performed; medication list updated in electronic medical record  Hypertension (BP goal <130/80) -Controlled - pt reports BP at home is at goal, denies low BP on full dose of lisinopril -Home BP: 129/73, 118/67,  -Current treatment: Lisinopril 10 mg daily -Denies hypotensive/hypertensive symptoms -Educated on BP goals and benefits of medications for prevention of heart attack, stroke and kidney damage; Importance of home blood pressure monitoring; -Counseled to monitor BP at home weekly -Recommended to continue current medication  Hyperlipidemia: (LDL goal < 100) -Controlled - LDL is at goal; pt endorses compliance with statin -Current treatment: Pravastatin 20 mg daily -Educated on Cholesterol goals; Benefits of statin for ASCVD risk reduction; -Recommended to continue current medication  Diabetes (A1c goal <8%) -Uncontrolled - A1c is above goal, but medications were adjusted since last check; home BG readings fluctuate; pt has been to a nutritionist more than once -Pt reports reducing metformin to 1000 mg/day has improved diarrhea side effect; she reports BG has been more elevated -Pt is due for repeat A1c, PCP f/u visit -Current  home glucose readings fasting glucose: 159-200 post prandial glucose: 180-240 -Current medications: Jardiance 25 mg daily Metformin ER 500 mg - taking 2 tablet Glipizide XL 10 mg daily Testing supplies -Medications previously tried: Januvia, Rybelsus -Denies hypoglycemic/hyperglycemic symptoms -Current meal patterns: sometimes "eats for comfort" -Educated on A1c and blood sugar goals; Complications of diabetes including kidney damage, retinal damage, and cardiovascular disease; -Discussed glipizide reduced efficacy over time; pt has been on this on-and-off for 10  years, likely little clinical benefit at this point -Discussed benefits of GLP-1 agonists; discussed injection technique; pt is amenable to trial of Trulicity if needed -Scheduled PCP f/u for 2/28; if A1c > 8% recommend stopping glipizide and starting Trulicity 2.83 mg weekly  Osteopenia (Goal prevent fractures) - not addressed 08/28/21 -Controlled -Last DEXA Scan: 12/25/20   T-Score femoral neck: -1.5  T-Score total hip: -0.8  T-Score lumbar spine: -0.6  10-year probability of major osteoporotic fracture: 16%  10-year probability of hip fracture: 2.6% -Patient is not a candidate for pharmacologic treatment -Current treatment  None -Recommend [phone] units of vitamin D daily. Recommend 1200 mg of calcium daily from dietary and supplemental sources. Recommend weight-bearing and muscle strengthening exercises for building and maintaining bone density.  Health Maintenance -Vaccine gaps: Flu, covid booster, Shingrix, TD booster  Patient Goals/Self-Care Activities Patient will:  - take medications as prescribed as evidenced by patient report and record review focus on medication adherence by routine check blood pressure weekly, document, and provide at future appointments check blood sugar daily, document, and provide at future appointments -Keep PCP appt 10/01/21 @ 12:40 pm      Patient verbalizes understanding of instructions and care plan provided today and agrees to view in [place]. Active MyChart status confirmed with patient.   Telephone follow up appointment with pharmacy team member scheduled for: 2 months  Charlene Brooke, PharmD, [facility] Clinical Pharmacist [place] [facility] at [facility] [phone]

## 2021-09-03 DIAGNOSIS — E1165 Type 2 diabetes mellitus with hyperglycemia: Secondary | ICD-10-CM | POA: Diagnosis not present

## 2021-09-03 DIAGNOSIS — I10 Essential (primary) hypertension: Secondary | ICD-10-CM | POA: Diagnosis not present

## 2021-09-03 DIAGNOSIS — E1169 Type 2 diabetes mellitus with other specified complication: Secondary | ICD-10-CM

## 2021-09-03 DIAGNOSIS — E785 Hyperlipidemia, unspecified: Secondary | ICD-10-CM

## 2021-10-01 ENCOUNTER — Ambulatory Visit: Payer: Medicare PPO | Admitting: Family Medicine

## 2021-10-08 ENCOUNTER — Ambulatory Visit: Payer: Medicare PPO | Admitting: Family Medicine

## 2021-10-11 ENCOUNTER — Ambulatory Visit: Payer: Medicare PPO | Admitting: Family Medicine

## 2021-10-11 ENCOUNTER — Other Ambulatory Visit: Payer: Self-pay

## 2021-10-11 ENCOUNTER — Telehealth: Payer: Self-pay

## 2021-10-11 VITALS — BP 110/72 | HR 74 | Temp 97.6°F | Ht 64.0 in | Wt 139.2 lb

## 2021-10-11 DIAGNOSIS — E1165 Type 2 diabetes mellitus with hyperglycemia: Secondary | ICD-10-CM

## 2021-10-11 LAB — POCT GLYCOSYLATED HEMOGLOBIN (HGB A1C): Hemoglobin A1C: 9.6 % — AB (ref 4.0–5.6)

## 2021-10-11 MED ORDER — TRULICITY 0.75 MG/0.5ML ~~LOC~~ SOAJ: 0.7500 mg | SUBCUTANEOUS | 0 refills | Status: DC

## 2021-10-11 NOTE — Assessment & Plan Note (Signed)
Lab Results  ?Component Value Date  ? HGBA1C 9.6 (A) 10/11/2021  ? ?Worse since last check, likely due to intolerance of high-dose metformin and reduce dose to 1000 mg daily.  Is following with the pharmacist, appreciate their support.  Stop glipizide 10 mg, start Trulicity 3.56 mg.  Follow-up with pharmacy as planned in 1 month will anticipate increasing Trulicity if tolerating at that time.  Long discussion about diet she is snacking at nighttime with what sound like carb heavy snacks.  She did see nutrition last fall but I feel she would benefit from an additional visit focused primarily on what snacks would be good for her to have what things would be good for her to eat to replace her carb heavy snacks.  Continue metformin 1000 mg daily and Jardiance 25 mg. ?

## 2021-10-11 NOTE — Progress Notes (Signed)
? ?Subjective:  ? ?  ?Wanda [name] is a 76 y.o. female presenting for Follow-up (DM /Eye exam scheduled the end of this month) ?  ? ? ?HPI ? ?#Diabetes ?Currently taking metformin, glipizide, jardiance  ?Using medications without difficulties: No - side effects with metformin, does skip days ?Hypoglycemic episodes:No  ?Hyperglycemic episodes:Yes  ?Feet problems:Yes  ?Blood Sugars averaging: usually 250 ?Last HgbA1c:  ?Lab Results  ?Component Value Date  ? HGBA1C 9.6 (A) 10/11/2021  ? ? ?Diabetes Health Maintenance Due:  ?  ?Diabetes Health Maintenance Due  ?Topic Date Due  ? OPHTHALMOLOGY EXAM  10/09/2021  ? FOOT EXAM  01/08/2022  ? HEMOGLOBIN A1C  04/13/2022  ? ?08/28/2021: pharmacy - reduced metformin to 1000 mg/day due to side effects. Consider changing glipizide to trulicity. on jardiance too ? ?Diet: small portions but nighttime eating is not good - eating more at night - popcorn, PBJ, cereal  ?Exercise: signed up for silver sneakers ? ?Review of Systems ? ? ?Social History  ? ?Tobacco Use  ?Smoking Status Never  ?Smokeless Tobacco Never  ? ? ? ?   ?Objective:  ?  ?BP Readings from Last 3 Encounters:  ?10/11/21 110/72  ?05/23/21 118/80  ?04/18/21 130/72  ? ?Wt Readings from Last 3 Encounters:  ?10/11/21 139 lb 4 oz (63.2 kg)  ?05/23/21 137 lb 8 oz (62.4 kg)  ?04/18/21 136 lb 4 oz (61.8 kg)  ? ? ?BP 110/72   Pulse 74   Temp 97.6 ?F (36.4 ?C) (Oral)   Ht '5\' 4"'$  (1.626 m)   Wt 139 lb 4 oz (63.2 kg)   SpO2 100%   BMI 23.90 kg/m?  ? ? ?Physical Exam ?Constitutional:   ?   General: She is not in acute distress. ?   Appearance: She is well-developed. She is not diaphoretic.  ?HENT:  ?   Right Ear: External ear normal.  ?   Left Ear: External ear normal.  ?Eyes:  ?   Conjunctiva/sclera: Conjunctivae normal.  ?Cardiovascular:  ?   Rate and Rhythm: Normal rate and regular rhythm.  ?   Heart sounds: No murmur heard. ?Pulmonary:  ?   Effort: Pulmonary effort is normal. No respiratory distress.  ?   Breath sounds:  Normal breath sounds. No wheezing.  ?Musculoskeletal:  ?   Cervical back: Neck supple.  ?Skin: ?   General: Skin is warm and dry.  ?   Capillary Refill: Capillary refill takes less than 2 seconds.  ?Neurological:  ?   Mental Status: She is alert. Mental status is at baseline.  ?Psychiatric:     ?   Mood and Affect: Mood normal.     ?   Behavior: Behavior normal.  ? ? ? ? ? ?   ?Assessment & Plan:  ? ?Problem List Items Addressed This Visit   ? ?  ? Endocrine  ? Type 2 diabetes mellitus with hyperglycemia, without long-term current use of insulin (HCC) - Primary  ?  Lab Results  ?Component Value Date  ? HGBA1C 9.6 (A) 10/11/2021  ?Worse since last check, likely due to intolerance of high-dose metformin and reduce dose to 1000 mg daily.  Is following with the pharmacist, appreciate their support.  Stop glipizide 10 mg, start Trulicity 2.83 mg.  Follow-up with pharmacy as planned in 1 month will anticipate increasing Trulicity if tolerating at that time.  Long discussion about diet she is snacking at nighttime with what sound like carb heavy snacks.  She did see nutrition  last fall but I feel she would benefit from an additional visit focused primarily on what snacks would be good for her to have what things would be good for her to eat to replace her carb heavy snacks.  Continue metformin 1000 mg daily and Jardiance 25 mg. ?  ?  ? Relevant Medications  ? Dulaglutide (TRULICITY) 9.71 KE/0.9HA SOPN  ? Other Relevant Orders  ? POCT glycosylated hemoglobin (Hb A1C) (Completed)  ? Ambulatory referral to Nutrition and Diabetic Education  ? ? ? ?Return in about 3 months (around 01/11/2022). ? ?Lesleigh Noe, MD ? ?This visit occurred during the SARS-CoV-2 public health emergency.  Safety protocols were in place, including screening questions prior to the visit, additional usage of staff PPE, and extensive cleaning of exam room while observing appropriate contact time as indicated for disinfecting solutions.  ? ?

## 2021-10-11 NOTE — Progress Notes (Signed)
? ? ?  Chronic Care Management ?Pharmacy Assistant  ? ?Name: Wanda [name]  MRN: [id number] DOB: August 30, 1945 ? ?Reason for Encounter: CCM Counsellor) ?  ?Medications: ?Outpatient Encounter Medications as of 10/11/2021  ?Medication Sig  ? acetaminophen (TYLENOL) 325 MG tablet Take 2 tablets by mouth as needed.  ? busPIRone (BUSPAR) 15 MG tablet Take 0.5 tablets (7.5 mg total) by mouth 2 (two) times daily. (Patient taking differently: Take 7.5 mg by mouth 2 (two) times daily. PRN)  ? Dulaglutide (TRULICITY) 7.82 NF/6.2ZH SOPN Inject 0.75 mg into the skin once a week.  ? empagliflozin (JARDIANCE) 25 MG TABS tablet Take 1 tablet (25 mg total) by mouth daily before breakfast.  ? glucose blood (ACCU-CHEK AVIVA PLUS) test strip Use as instructed to test blood sugar 2 times daily E11.45  ? lisinopril (ZESTRIL) 10 MG tablet TAKE 1 TABLET BY MOUTH DAILY.  ? metFORMIN (GLUCOPHAGE-XR) 500 MG 24 hr tablet TAKE 4 TABLETS (2,000 MG TOTAL) BY MOUTH DAILY WITH BREAKFAST. (Patient taking differently: Take 1,000 mg by mouth daily with breakfast. (2 tablets))  ? omeprazole (PRILOSEC) 20 MG capsule TAKE 1 CAPSULE BY MOUTH DAILY AS NEEDED. (Patient taking differently: Take 20 mg by mouth every other day.)  ? pravastatin (PRAVACHOL) 20 MG tablet TAKE 1 TABLET BY MOUTH DAILY WITH EVENING MEAL  ? ?No facility-administered encounter medications on file as of 10/11/2021.  ? ?Wanda [name] was contacted to remind of upcoming telephone visit with Charlene Brooke on 10/16/2021 at 3:00. Patient asked to reschedule her appointment. Patient is due to take her first injection of Trulicity on 08/65/7846. Patient wanted to have her appointment after she had been taking Trulicity to discuss any side effects she may be having with Mendel Ryder. Patient has been rescheduled to 04/04 @ 3:45 pm. ? ?Star Rating Drugs: ?Medication:  Last Fill: Day Supply ?Pravastatin 20 mg 06/05/2021 90  ?Metformin 500 mg 04/09/2021 90 ?Lisinopril 10  mg 07/10/2021 90 ?Trulicity 9.62 mg 95/28/4132 - Filled today not picked up yet ?Fill dates verified with [facility] ? ?Charlene Brooke, CPP notified ? ?Marijean Niemann, RMA ?Clinical Pharmacy Assistant ?[phone] ? ? ? ? ? ?

## 2021-10-11 NOTE — Patient Instructions (Addendum)
Start Trulicity ?- stop Glipizide ?- work on diabetic diet ?- see the pharmacist  ? ?Diabetes.org ? ? ?Low carb, whole grains ? ?PBJ -- make this lower carb by choosing Whole grain bread (low carb), once slice of bread, removing jelly or buying sugar free jelly (low carb) ? ? ?3. Snack Smart  ?When you?re busy, a good snack can keep you from overeating later in the day. However, some snacks may seem healthy, but still may be high in carbs, added sugars, fat, and sodium and have little nutritional value. Whether you?re making your own snacks or buying them at a convenience store, here are some helpful tips to make sure you?re making healthier choices:  ? ?Keep non-perishable snacks such as unsalted nuts (walnuts, almonds, peanuts, cashews, etc.), or trail mix on hand. These snacks are great sources of healthy fats and protein and keep you full.  ?Choose snacks that are whole grain and high in fiber.  ?Prepare vegetable snacks with a delicious dip such as hummus or salsa.  ?Keep it simple with whole pieces of fruit or string cheese.  ?Purchase snack packs, but make sure the nutrition content meets your goals. Aim for snack packs that are lower in carbohydrates, added sugars, and sodium. ?Prepare your own snack packs. Snack pack ideas include chopped fruit, granola, rice cakes, smoothie-ready ingredients, frozen fruit, or yogurt.  ?Similar to snack packs, find protein bars that don?t have added sugar or try making your own protein bar like this pumpkin apple protein bar  ? ? ? ?

## 2021-10-16 ENCOUNTER — Telehealth: Payer: Medicare PPO

## 2021-10-21 ENCOUNTER — Other Ambulatory Visit: Payer: Self-pay | Admitting: Family Medicine

## 2021-10-22 ENCOUNTER — Other Ambulatory Visit: Payer: Self-pay

## 2021-10-24 ENCOUNTER — Telehealth: Payer: Self-pay | Admitting: Family Medicine

## 2021-10-24 NOTE — Telephone Encounter (Signed)
Pt called in stated she need a proscription for One touch test strips to be call in to [place], [place]  . Please [phone] # ?

## 2021-10-25 ENCOUNTER — Other Ambulatory Visit: Payer: Self-pay

## 2021-10-25 MED ORDER — GLUCOSE BLOOD VI STRP: 1.0000 | ORAL_STRIP | Freq: Two times a day (BID) | 1 refills | Status: DC

## 2021-10-25 NOTE — Telephone Encounter (Signed)
Refill sent.

## 2021-10-30 ENCOUNTER — Telehealth: Payer: Self-pay

## 2021-10-30 NOTE — Progress Notes (Signed)
? ? ?  Chronic Care Management ?Pharmacy Assistant  ? ?Name: Wanda [name]  MRN: [id number] DOB: 01/23/1946 ? ?Reason for Encounter: CCM Counsellor) ?  ?Medications: ?Outpatient Encounter Medications as of 10/30/2021  ?Medication Sig  ? acetaminophen (TYLENOL) 325 MG tablet Take 2 tablets by mouth as needed.  ? busPIRone (BUSPAR) 15 MG tablet Take 0.5 tablets (7.5 mg total) by mouth 2 (two) times daily. (Patient taking differently: Take 7.5 mg by mouth 2 (two) times daily. PRN)  ? Dulaglutide (TRULICITY) 8.01 KP/5.3ZS SOPN Inject 0.75 mg into the skin once a week.  ? empagliflozin (JARDIANCE) 25 MG TABS tablet Take 1 tablet (25 mg total) by mouth daily before breakfast.  ? glucose blood (ACCU-CHEK AVIVA PLUS) test strip Use as instructed to test blood sugar 2 times daily E11.45  ? glucose blood test strip 1 each by Other route 2 (two) times daily. Use as instructed to check blood sugar 2 times daily with OneTouch meter.  ? lisinopril (ZESTRIL) 10 MG tablet TAKE 1 TABLET BY MOUTH DAILY.  ? metFORMIN (GLUCOPHAGE-XR) 500 MG 24 hr tablet Take 2 tablets (1,000 mg total) by mouth daily with breakfast. (2 tablets)  ? omeprazole (PRILOSEC) 20 MG capsule TAKE 1 CAPSULE BY MOUTH DAILY AS NEEDED. (Patient taking differently: Take 20 mg by mouth every other day.)  ? pravastatin (PRAVACHOL) 20 MG tablet TAKE 1 TABLET BY MOUTH DAILY WITH EVENING MEAL  ? ?No facility-administered encounter medications on file as of 10/30/2021.  ? ?Wanda [name] was contacted to remind of upcoming telephone visit with Charlene Brooke on 11/05/2021 at 3:45. Patient was reminded to have any blood glucose and blood pressure readings available for review at appointment.  ? ?Patient confirmed appointment. ? ?Are you having any problems with your medications? No  ? ?Do you have any concerns you like to discuss with the pharmacist? No ? ?Star Rating Drugs: ?Medication:  Last Fill: Day Supply ?Metformin 500 mg 10/23/2021 30  ?Trulicity 8.27  mg 07/86/7544 28  ?Pravastatin 20 mg 06/05/2021 90 ?Lisinopril 10 mg 07/10/2021 90 ?Glipizide 10 mg 09/02/2021 90 ? ?Charlene Brooke, CPP notified ? ?Marijean Niemann, RMA ?Clinical Pharmacy Assistant ?[phone] ? ? ? ? ? ?

## 2021-11-01 DIAGNOSIS — H524 Presbyopia: Secondary | ICD-10-CM | POA: Diagnosis not present

## 2021-11-01 DIAGNOSIS — E119 Type 2 diabetes mellitus without complications: Secondary | ICD-10-CM | POA: Diagnosis not present

## 2021-11-01 LAB — HM DIABETES EYE EXAM

## 2021-11-05 ENCOUNTER — Telehealth: Payer: Self-pay | Admitting: Pharmacist

## 2021-11-05 ENCOUNTER — Telehealth: Payer: Medicare PPO

## 2021-11-05 NOTE — Telephone Encounter (Signed)
?  Chronic Care Management  ? ?Outreach Note ? ?11/05/2021 ?Name: Niki Payment MRN: [id number] DOB: [birth year] ? ?Referred by: Lesleigh Noe, MD ? ?Patient had a phone appointment scheduled with clinical pharmacist today. ? ?An unsuccessful telephone outreach was attempted today. The patient was referred to the pharmacist for assistance with medications, care management and care coordination.  ? ?Patient will NOT be penalized in any way for missing a CCM appointment. The no-show fee does not apply. ? ?If possible, a message was left to return call to: [phone] or to [facility]. ? ?Charlene Brooke, PharmD, BCACP ?Clinical Pharmacist ?[facility] ?[phone] ? ? ?

## 2021-11-05 NOTE — Progress Notes (Deleted)
? ?Chronic Care Management ?Pharmacy Note ? ?11/05/2021 ?Name:  Wanda [name] MRN:  [id number] DOB:  02/14/1946 ? ?Summary: CCM F/U visit ?-1 month follow up: pt reports reducing metformin to 1000 mg/day has significantly improved GI symptoms; BG is slightly worse (fasting 150-190, post-prandial 180-240) ?-Pt has been on glipizide on-and-off for ~10 years; benefit is likely wearing off at this point. Discussed possible medication change in future and pt is amenable to switching to a GLP-1 injection ? ?Recommendations/Changes made from today's visit: ?-Scheduled PCP f/u 2/28 - if A1c >8%,  recommended to change glipizide to Trulicity 2.97 mg weekly ? ?Plan: ?-PCP f/u appt 10/01/21 ?-Pharmacist follow up televisit scheduled for 2 months ? ? ?Subjective: ?Wanda [name] is an 76 y.o. year old female who is a primary patient of Cody, Jobe Marker, MD.  The CCM team was consulted for assistance with disease management and care coordination needs.   ? ?Engaged with patient by telephone for follow up visit in response to provider referral for pharmacy case management and/or care coordination services.  ? ?Consent to Services:  ?The patient was given information about Chronic Care Management services, agreed to services, and gave verbal consent prior to initiation of services.  Please see initial visit note for detailed documentation.  ? ?Patient Care Team: ?Lesleigh Noe, MD as PCP - General (Family Medicine) ?Katy Apo, MD as Consulting Physician (Ophthalmology) ?Shamleffer, Melanie Crazier, MD as Consulting Physician (Endocrinology) ?Willia Craze, NP as Nurse Practitioner (Gastroenterology) ?Chaneka Trefz, Cleaster Corin, [facility] as Pharmacist (Pharmacist) ? ?Recent office visits: ?10/11/21 Dr Einar Pheasant OV: f/u DM. A1c 9.6 (up from 8.6); Stop Glipizide, start Trulicity. Referred to nutrition. ? ?05/23/2021 - [name], MD - Patient presented for follow up for diabetes. Continue Buspar 7.5 mg BID due to significant improvement. Change:  Decrease Lisinopril 10 > 27m due to orthostatic hypotension.  ? ?04/18/2021 - [name] MD - Patient presented for diabetes follow up. Referral to Gastroenterology due to nausea and difficulty swallowing. Change: switch glipizide 5 mg bid to glipizide XL 10 mg daily. Change: Increase jardiance to 25 mg. Change: Increase buspar 3.75 > 7.5 mg and to 15 mg daily if no improvement after 2 weeks for anxiety.  ? ?Recent consult visits: ?07/23/21 Dr SCandiss Norse(Nephrology): f/u CKD. Stable. No changes. F/U 1 year. ? ?04/02/2021 - SJohny Drilling RN - Nutrition - Patient presented for diabetes. Recommended: Check blood sugars 1 x day before breakfast or 2 hrs after supper every day.  ? ?02/25/2021 - TCasimiro Needle RN - Urology - Telephone - Start: cephalexin ([facility] 500 MG capsule for 5 days.  ? ?02/20/2021 - CMaryland Pink- Urology - Patient presented for urinary frequency and prolapse of vaginal wall. No other information provided.  ? ?02/19/2021 - SJohny Drilling RN - Nutrition - Patient presented for diabetes. Recommended: Eat 3 meals day, 1-2  snacks a day, space meals 4-6 hours apart, don't skip meals and limit fried foods, desserts and sweets. Complete 3 Day Food Record and bring to next appt. ?  ?Hospital visits: ?None in previous 6 months ? ? ?Objective: ? ?Lab Results  ?Component Value Date  ? CREATININE 1.0 07/16/2021  ? BUN 22 (A) 07/16/2021  ? GFR 61.34 07/06/2020  ? GFRNONAA 57 (L) 06/23/2014  ? GFRAA 67 (L) 06/23/2014  ? NA 142 07/16/2021  ? K 4.1 07/16/2021  ? CALCIUM 9.9 07/16/2021  ? CO2 26 (A) 07/16/2021  ? GLUCOSE 176 (H) 07/06/2020  ? ? ?Lab Results  ?Component Value  Date/Time  ? HGBA1C 9.6 (A) 10/11/2021 10:49 AM  ? HGBA1C 8.6 (A) 04/18/2021 11:49 AM  ? HGBA1C 9.3 (H) 07/06/2020 02:33 PM  ? HGBA1C 8.0 (H) 08/23/2019 02:36 PM  ? HGBA1C 7.7 08/25/2018 10:28 AM  ? GFR 61.34 07/06/2020 02:33 PM  ? GFR 57.16 (L) 08/23/2019 02:36 PM  ? GFR 57.16 (L) 08/23/2019 02:36 PM  ? MICROALBUR 1.3 10/02/2014  03:01 PM  ? MICROALBUR 1.9 08/05/2013 09:20 AM  ?  ?Last diabetic Eye exam:  ?Lab Results  ?Component Value Date/Time  ? HMDIABEYEEXA No Retinopathy 10/09/2020 12:00 AM  ?  ?Last diabetic Foot exam: No results found for: HMDIABFOOTEX  ? ?Lab Results  ?Component Value Date  ? CHOL 181 07/06/2020  ? HDL 39.30 07/06/2020  ? [place] 84 05/05/2017  ? LDLDIRECT 96.0 07/06/2020  ? TRIG 279.0 (H) 07/06/2020  ? CHOLHDL 5 07/06/2020  ? ? ? ?  Latest Ref Rng & Units 07/16/2021  ? 12:00 AM 07/06/2020  ?  2:33 PM 08/23/2019  ?  2:36 PM  ?Hepatic Function  ?Total Protein 6.0 - 8.3 g/dL  7.4   7.3    ?Albumin 3.5 - 5.0 4.3      4.2   4.1    ? 4.1    ?AST 0 - 37 U/L  32   21    ?ALT 0 - 35 U/L  34   27    ?Alk Phosphatase 39 - 117 U/L  90   85    ?Total Bilirubin 0.2 - 1.2 mg/dL  0.4   0.3    ?  ? This result is from an external source.  ? ? ?Lab Results  ?Component Value Date/Time  ? TSH 3.57 08/23/2019 02:36 PM  ? TSH 4.36 05/18/2019 01:32 PM  ? ? ? ?  Latest Ref Rng & Units 07/16/2021  ? 12:00 AM 07/06/2020  ?  2:33 PM 08/23/2019  ?  2:36 PM  ?CBC  ?WBC  7.2      7.8   7.2    ?Hemoglobin 12.0 - 16.0 13.2      12.8   11.7    ?Hematocrit 36 - 46 40      38.8   36.0    ?Platelets 150 - 399 283      291.0   245.0    ?  ? This result is from an external source.  ? ? ?Lab Results  ?Component Value Date/Time  ? VD25OH 21.70 (L) 08/23/2019 02:36 PM  ? ? ?Clinical ASCVD: No  ?The 10-year ASCVD risk score (Arnett DK, et al., 2019) is: 23.7% ?  Values used to calculate the score: ?    Age: 73 years ?    Sex: Female ?    Is Non-Hispanic African American: No ?    Diabetic: Yes ?    Tobacco smoker: No ?    Systolic Blood Pressure: 889 mmHg ?    Is BP treated: Yes ?    HDL Cholesterol: 39.3 mg/dL ?    Total Cholesterol: 181 mg/dL   ? ? ?  05/23/2021  ? 12:52 PM 02/19/2021  ?  2:04 PM 08/22/2019  ? 11:00 AM  ?Depression screen PHQ 2/9  ?Decreased Interest 1 0 0  ?Down, Depressed, Hopeless 0 0 0  ?PHQ - 2 Score 1 0 0  ?Altered sleeping 1    ?Tired,  decreased energy 1    ?Change in appetite 1    ?Feeling bad or failure about  yourself  0    ?Trouble concentrating 0    ?Moving slowly or fidgety/restless 0    ?Suicidal thoughts 0    ?PHQ-9 Score 4    ?Difficult doing work/chores Not difficult at all    ?  ? ?Social History  ? ?Tobacco Use  ?Smoking Status Never  ?Smokeless Tobacco Never  ? ?BP Readings from Last 3 Encounters:  ?10/11/21 110/72  ?05/23/21 118/80  ?04/18/21 130/72  ? ?Pulse Readings from Last 3 Encounters:  ?10/11/21 74  ?05/23/21 79  ?04/18/21 77  ? ?Wt Readings from Last 3 Encounters:  ?10/11/21 139 lb 4 oz (63.2 kg)  ?05/23/21 137 lb 8 oz (62.4 kg)  ?04/18/21 136 lb 4 oz (61.8 kg)  ? ?BMI Readings from Last 3 Encounters:  ?10/11/21 23.90 kg/m?  ?05/23/21 23.60 kg/m?  ?04/18/21 23.39 kg/m?  ? ? ?Assessment/Interventions: Review of patient past medical history, allergies, medications, health status, including review of consultants reports, laboratory and other test data, was performed as part of comprehensive evaluation and provision of chronic care management services.  ? ?SDOH:  (Social Determinants of Health) assessments and interventions performed: Yes ? ? ?SDOH Screenings  ? ?Alcohol Screen: Not on file  ?Depression (PHQ2-9): Low Risk   ? PHQ-2 Score: 4  ?Financial Resource Strain: Low Risk   ? Difficulty of Paying Living Expenses: Not very hard  ?Food Insecurity: Not on file  ?Housing: Not on file  ?Physical Activity: Not on file  ?Social Connections: Not on file  ?Stress: Not on file  ?Tobacco Use: Low Risk   ? Smoking Tobacco Use: Never  ? Smokeless Tobacco Use: Never  ? Passive Exposure: Not on file  ?Transportation Needs: Not on file  ? ? ?[place] ? ?Allergies  ?Allergen Reactions  ? Niacin   ?  REACTION: unbearable hot flashes  ? Sulfamethoxazole-Trimethoprim Itching  ? ? ?Medications Reviewed Today   ? ? Reviewed by Loreen Freud, CMA (Certified Medical Assistant) on 10/11/21 at 1044  Med List Status: <None>  ? ?Medication  Order Taking? Sig Documenting Provider Last Dose Status Informant  ?acetaminophen (TYLENOL) 325 MG tablet [id number] Yes Take 2 tablets by mouth as needed. [provider] Taking Active   ?busPIRone (

## 2021-11-08 ENCOUNTER — Other Ambulatory Visit: Payer: Self-pay | Admitting: Family Medicine

## 2021-11-08 DIAGNOSIS — E1165 Type 2 diabetes mellitus with hyperglycemia: Secondary | ICD-10-CM

## 2021-11-13 NOTE — Progress Notes (Signed)
Called patient to reschedule her missed telephone appointment with Charlene Brooke on 11/05/2021 @ 3:45 pm. Patient will need to call me back to reschedule as she was unavailable to talk.  ? ?Charlene Brooke, CPP notified ? ?Marijean Niemann, RMA ?Clinical Pharmacy Assistant ?[phone] ? ? ?

## 2021-11-15 ENCOUNTER — Other Ambulatory Visit: Payer: Self-pay | Admitting: Family Medicine

## 2021-12-09 ENCOUNTER — Ambulatory Visit: Payer: Medicare PPO | Admitting: Dietician

## 2021-12-09 DIAGNOSIS — Z1211 Encounter for screening for malignant neoplasm of colon: Secondary | ICD-10-CM

## 2021-12-17 ENCOUNTER — Ambulatory Visit (INDEPENDENT_AMBULATORY_CARE_PROVIDER_SITE_OTHER): Payer: Medicare PPO

## 2021-12-17 ENCOUNTER — Encounter: Payer: Self-pay | Admitting: Physician Assistant

## 2021-12-17 VITALS — Ht 64.0 in | Wt 130.0 lb

## 2021-12-17 DIAGNOSIS — Z0000 Encounter for general adult medical examination without abnormal findings: Secondary | ICD-10-CM | POA: Diagnosis not present

## 2021-12-17 DIAGNOSIS — Z1211 Encounter for screening for malignant neoplasm of colon: Secondary | ICD-10-CM

## 2021-12-17 NOTE — Progress Notes (Signed)
? ?Subjective:  ? Wanda [name] is a 76 y.o. female who presents for Medicare Annual (Subsequent) preventive examination. ? ?Review of Systems    ?Virtual Visit via Telephone Note ? ?I connected with  Wanda [name] on 12/17/21 at  2:45 PM EDT by telephone and verified that I am speaking with the correct person using two identifiers. ? ?Location: ?Patient: Home ?Provider: Office ?Persons participating in the virtual visit: patient/Nurse Health Advisor ?  ?I discussed the limitations, risks, security and privacy concerns of performing an evaluation and management service by telephone and the availability of in person appointments. The patient expressed understanding and agreed to proceed. ? ?Interactive audio and video telecommunications were attempted between this nurse and patient, however failed, due to patient having technical difficulties OR patient did not have access to video capability.  We continued and completed visit with audio only. ? ?Some vital signs may be absent or patient reported.  ? ?Criselda Peaches, LPN  ?Cardiac Risk Factors include: advanced age (>63mn, >>21women);diabetes mellitus;hypertension ? ?   ?Objective:  ?  ?Today's Vitals  ? 12/17/21 1445  ?Weight: 130 lb (59 kg)  ?Height: '5\' 4"'$  (1.626 m)  ? ?Body mass index is 22.31 kg/m?. ? ? ?  12/17/2021  ?  2:57 PM 02/19/2021  ?  2:03 PM 02/20/2016  ? 11:09 AM  ?Advanced Directives  ?Does Patient Have a Medical Advance Directive? Yes Yes Yes  ?Type of AParamedicof A[place]enaLiving will H[place]incLiving will Living will;Healthcare Power of Attorney  ?Does patient want to make changes to medical advance directive? No - Patient declined No - Patient declined Yes - information given  ?Copy of HB[place]in Chart? No - copy requested    ? ? ?Current Medications (verified) ?Outpatient Encounter Medications as of 12/17/2021  ?Medication Sig  ? acetaminophen (TYLENOL) 325 MG tablet Take 2 tablets by  mouth as needed.  ? busPIRone (BUSPAR) 15 MG tablet Take 0.5 tablets (7.5 mg total) by mouth 2 (two) times daily. (Patient taking differently: Take 7.5 mg by mouth 2 (two) times daily. PRN)  ? empagliflozin (JARDIANCE) 25 MG TABS tablet Take 1 tablet (25 mg total) by mouth daily before breakfast.  ? glucose blood (ACCU-CHEK AVIVA PLUS) test strip Use as instructed to test blood sugar 2 times daily E11.45  ? glucose blood test strip 1 each by Other route 2 (two) times daily. Use as instructed to check blood sugar 2 times daily with OneTouch meter.  ? lisinopril (ZESTRIL) 10 MG tablet TAKE 1 TABLET BY MOUTH DAILY.  ? metFORMIN (GLUCOPHAGE-XR) 500 MG 24 hr tablet Take 2 tablets (1,000 mg total) by mouth daily with breakfast. (2 tablets)  ? omeprazole (PRILOSEC) 20 MG capsule Take 1 capsule (20 mg total) by mouth every other day.  ? pravastatin (PRAVACHOL) 20 MG tablet TAKE 1 TABLET BY MOUTH DAILY WITH EVENING MEAL  ? TRULICITY 06.62MHU/7.6LYSOPN INJECT 0.75 MG INTO THE SKIN ONCE A WEEK.  ? ?No facility-administered encounter medications on file as of 12/17/2021.  ? ? ?Allergies (verified) ?Niacin and Sulfamethoxazole-trimethoprim  ? ?History: ?Past Medical History:  ?Diagnosis Date  ? Anxiety   ? Colon polyps   ? Depression   ? DM (diabetes mellitus) ([place]   ? Female bladder prolapse   ? Fibromyalgia   ? "neurofibromyalgia"  ? Headache   ? HTN (hypertension)   ? Hyperlipidemia   ? ?Past Surgical History:  ?Procedure Laterality Date  ?  COLONOSCOPY  03/28/2019  ? Dr Collene Mares  ? WRIST SURGERY  1969  ? ?Family History  ?Problem Relation Age of Onset  ? Diabetes Mother   ? Hypertension Mother   ? Hyperlipidemia Mother   ? Stroke Mother   ? Heart disease Mother   ? Heart disease Father   ? Diabetes Sister   ? Diabetes Sister   ? Breast cancer Sister   ? Parkinson's disease Brother   ? Heart disease Maternal Grandmother   ? Diabetes Maternal Grandfather   ? Heart attack Other   ? Colon cancer Neg Hx   ? Esophageal cancer Neg Hx    ? ?Social History  ? ?Socioeconomic History  ? Marital status: Widowed  ?  Spouse name: Not on file  ? Number of children: 2  ? Years of education: Master's degree  ? Highest education level: Not on file  ?Occupational History  ?  Comment: retired Pharmacist, hospital  ?Tobacco Use  ? Smoking status: Never  ? Smokeless tobacco: Never  ?Vaping Use  ? Vaping Use: Never used  ?Substance and Sexual Activity  ? Alcohol use: No  ?  Alcohol/week: 0.0 standard drinks  ? Drug use: No  ? Sexual activity: Not Currently  ?Other Topics Concern  ? Not on file  ?Social History Narrative  ? Lives with husband  ?  caffeine - none  ? 01/16/20  ? From: [place]   ? Living: alone  ? 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   ? Family: Marlou Sa (mosiacism down's syndrome, lives in garage apartment) and Annie Main   ?   ? Enjoys: pickle-ball prior to covid, traveling with sisters, house projects  ?   ? Exercise: not currently - wants to get back to walking  ? Diet: trying to reduce portions, limit soda  ?   ? Safety  ? Seat belts: Yes   ? Guns: No  ? Safe in relationships: Yes   ? ?Social Determinants of Health  ? ?Financial Resource Strain: Low Risk   ? Difficulty of Paying Living Expenses: Not hard at all  ?Food Insecurity: No Food Insecurity  ? Worried About Charity fundraiser in the Last Year: Never true  ? Ran Out of Food in the Last Year: Never true  ?Transportation Needs: No Transportation Needs  ? Lack of Transportation (Medical): No  ? Lack of Transportation (Non-Medical): No  ?Physical Activity: Unknown  ? Days of Exercise per Week: Patient refused  ? Minutes of Exercise per Session: Patient refused  ?Stress: No Stress Concern Present  ? Feeling of Stress : Not at all  ?Social Connections: Moderately Integrated  ? Frequency of Communication with Friends and Family: More than three times a week  ? Frequency of Social Gatherings with Friends and Family: More than three times a  week  ? Attends Religious Services: More than 4 times per year  ? Active Member of Clubs or Organizations: Yes  ? Attends Archivist Meetings: More than 4 times per year  ? Marital Status: Widowed  ? ? ?Tobacco Counseling ?Counseling given: Not Answered ? ? ?Clinical Intake: ?Nutrition Risk Assessment: ? ?Has the patient had any N/V/D within the last 2 months?  No  ?Does the patient have any non-healing wounds?  No  ?Has the patient had any unintentional weight loss or weight gain?  No  ? ?Diabetes: ? ?Is the patient diabetic?  Yes  ?  If diabetic, was a CBG obtained today?  No  ?Did the patient bring in their glucometer from home?  No  ?How often do you monitor your CBG's? PRN.  ? ?Financial Strains and Diabetes Management: ? ?Are you having any financial strains with the device, your supplies or your medication? No .  ?Does the patient want to be seen by Chronic Care Management for management of their diabetes?  No  ?Would the patient like to be referred to a Nutritionist or for Diabetic Management?  No  ? ?Diabetic Exams: ? ?Diabetic Eye Exam: Completed Yes. Overdue for diabetic eye exam. Pt has been advised about the importance in completing this exam. A referral has been placed today. Message sent to referral coordinator for scheduling purposes. Advised pt to expect a call from office referred to regarding appt. ? ?Diabetic Foot Exam: Completed Yes. Pt has been advised about the importance in completing this exam. Pt is scheduled for diabetic foot exam on Followed by PCP.   ?Pre-visit preparation completed: NoHow often do you need to have someone help you when you read instructions, pamphlets, or other written materials from your doctor or pharmacy?: 1 - Never ? ?Diabetic?  Yes ? ? ?Activities of Daily Living ? ?  12/17/2021  ?  2:53 PM  ?In your present state of health, do you have any difficulty performing the following activities:  ?Hearing? 0  ?Vision? 0  ?Difficulty concentrating or making  decisions? 0  ?Walking or climbing stairs? 0  ?Dressing or bathing? 0  ?Doing errands, shopping? 0  ?Preparing Food and eating ? N  ?Using the Toilet? N  ?In the past six months, have you accidently leaked urine

## 2021-12-17 NOTE — Patient Instructions (Addendum)
?Wanda [name] , ?Thank you for taking time to come for your Medicare Wellness Visit. I appreciate your ongoing commitment to your health goals. Please review the following plan we discussed and let me know if I can assist you in the future.  ? ?These are the goals we discussed: ? Goals   ? ?   Increase physical activity   ?   Starting 02/20/16, I will continue to exercise at gym at least 60 minutes as tolerated.  ? ?  ?   Increase physical activity (pt-stated)   ?   I would like to continue traveling. ?  ?   Monitor and Manage My Blood Sugar-Diabetes Type 2   ?   Timeframe:  Long-Range Goal ?Priority:  High ?Start Date:   07/24/21                          ?Expected End Date: 07/24/22                     ? ?Follow Up Date March 2023 ?  ?- check blood sugar at prescribed times ?- check blood sugar if I feel it is too high or too low ?- take the blood sugar meter to all doctor visits  ?  ?Why is this important?   ?Checking your blood sugar at home helps to keep it from getting very high or very low.  ?Writing the results in a diary or log helps the doctor know how to care for you.  ?Your blood sugar log should have the time, date and the results.  ?Also, write down the amount of insulin or other medicine that you take.  ?Other information, like what you ate, exercise done and how you were feeling, will also be helpful.   ?  ?Notes:  ?  ? ?  ?  ?This is a list of the screening recommended for you and due dates:  ?Health Maintenance  ?Topic Date Due  ? COVID-19 Vaccine (3 - Booster for Pfizer series) 01/02/2022*  ? Zoster (Shingles) Vaccine (1 of 2) 03/19/2022*  ? Tetanus Vaccine  12/18/2022*  ? Cologuard (Stool DNA test)  12/18/2022*  ? Complete foot exam   01/08/2022  ? Flu Shot  03/04/2022  ? Hemoglobin A1C  04/13/2022  ? Mammogram  06/13/2022  ? Eye exam for diabetics  11/02/2022  ? DEXA scan (bone density measurement)  12/26/2022  ? Pneumonia Vaccine  Completed  ? Hepatitis C Screening: USPSTF Recommendation to screen  - Ages 28-79 yo.  Completed  ? HPV Vaccine  Aged Out  ?*Topic was postponed. The date shown is not the original due date.  ? ?Advanced directives: Yes ? ?Conditions/risks identified: None ? ?Next appointment: Follow up in one year for your annual wellness visit  ? ? ? ?Preventive Care 74 Years and Older, Female ?Preventive care refers to lifestyle choices and visits with your health care provider that can promote health and wellness. ?What does preventive care include? ?A yearly physical exam. This is also called an annual well check. ?Dental exams once or twice a year. ?Routine eye exams. Ask your health care provider how often you should have your eyes checked. ?Personal lifestyle choices, including: ?Daily care of your teeth and gums. ?Regular physical activity. ?Eating a healthy diet. ?Avoiding tobacco and drug use. ?Limiting alcohol use. ?Practicing safe sex. ?Taking low-dose aspirin every day. ?Taking vitamin and mineral supplements as recommended by your health care provider. ?  What happens during an annual well check? ?The services and screenings done by your health care provider during your annual well check will depend on your age, overall health, lifestyle risk factors, and family history of disease. ?Counseling  ?Your health care provider may ask you questions about your: ?Alcohol use. ?Tobacco use. ?Drug use. ?Emotional well-being. ?Home and relationship well-being. ?Sexual activity. ?Eating habits. ?History of falls. ?Memory and ability to understand (cognition). ?Work and work Statistician. ?Reproductive health. ?Screening  ?You may have the following tests or measurements: ?Height, weight, and BMI. ?Blood pressure. ?Lipid and cholesterol levels. These may be checked every 5 years, or more frequently if you are over 15 years old. ?Skin check. ?Lung cancer screening. You may have this screening every year starting at age 83 if you have a 30-pack-year history of smoking and currently smoke or have quit  within the past 15 years. ?Fecal occult blood test (FOBT) of the stool. You may have this test every year starting at age 37. ?Flexible sigmoidoscopy or colonoscopy. You may have a sigmoidoscopy every 5 years or a colonoscopy every 10 years starting at age 108. ?Hepatitis C blood test. ?Hepatitis B blood test. ?Sexually transmitted disease (STD) testing. ?Diabetes screening. This is done by checking your blood sugar (glucose) after you have not eaten for a while (fasting). You may have this done every 1-3 years. ?Bone density scan. This is done to screen for osteoporosis. You may have this done starting at age 67. ?Mammogram. This may be done every 1-2 years. Talk to your health care provider about how often you should have regular mammograms. ?Talk with your health care provider about your test results, treatment options, and if necessary, the need for more tests. ?Vaccines  ?Your health care provider may recommend certain vaccines, such as: ?Influenza vaccine. This is recommended every year. ?Tetanus, diphtheria, and acellular pertussis (Tdap, Td) vaccine. You may need a Td booster every 10 years. ?Zoster vaccine. You may need this after age 61. ?Pneumococcal 13-valent conjugate (PCV13) vaccine. One dose is recommended after age 10. ?Pneumococcal polysaccharide (PPSV23) vaccine. One dose is recommended after age 31. ?Talk to your health care provider about which screenings and vaccines you need and how often you need them. ?This information is not intended to replace advice given to you by your health care provider. Make sure you discuss any questions you have with your health care provider. ?Document Released: 08/17/2015 Document Revised: 04/09/2016 Document Reviewed: 05/22/2015 ?Elsevier Interactive Patient Education ? 2017 [place]. ? ?Fall Prevention in the Home ?Falls can cause injuries. They can happen to people of all ages. There are many things you can do to make your home safe and to help prevent  falls. ?What can I do on the outside of my home? ?Regularly fix the edges of walkways and driveways and fix any cracks. ?Remove anything that might make you trip as you walk through a door, such as a raised step or threshold. ?Trim any bushes or trees on the path to your home. ?Use bright outdoor lighting. ?Clear any walking paths of anything that might make someone trip, such as rocks or tools. ?Regularly check to see if handrails are loose or broken. Make sure that both sides of any steps have handrails. ?Any raised decks and porches should have guardrails on the edges. ?Have any leaves, snow, or ice cleared regularly. ?Use sand or salt on walking paths during winter. ?Clean up any spills in your garage right away. This includes oil or grease  spills. ?What can I do in the bathroom? ?Use night lights. ?Install grab bars by the toilet and in the tub and shower. Do not use towel bars as grab bars. ?Use non-skid mats or decals in the tub or shower. ?If you need to sit down in the shower, use a plastic, non-slip stool. ?Keep the floor dry. Clean up any water that spills on the floor as soon as it happens. ?Remove soap buildup in the tub or shower regularly. ?Attach bath mats securely with double-sided non-slip rug tape. ?Do not have throw rugs and other things on the floor that can make you trip. ?What can I do in the bedroom? ?Use night lights. ?Make sure that you have a light by your bed that is easy to reach. ?Do not use any sheets or blankets that are too big for your bed. They should not hang down onto the floor. ?Have a firm chair that has side arms. You can use this for support while you get dressed. ?Do not have throw rugs and other things on the floor that can make you trip. ?What can I do in the kitchen? ?Clean up any spills right away. ?Avoid walking on wet floors. ?Keep items that you use a lot in easy-to-reach places. ?If you need to reach something above you, use a strong step stool that has a grab  bar. ?Keep electrical cords out of the way. ?Do not use floor polish or wax that makes floors slippery. If you must use wax, use non-skid floor wax. ?Do not have throw rugs and other things on the floor that can m

## 2021-12-19 ENCOUNTER — Encounter: Payer: Self-pay | Admitting: Family Medicine

## 2021-12-27 LAB — COLOGUARD

## 2022-01-02 ENCOUNTER — Ambulatory Visit: Payer: Medicare PPO | Admitting: Family Medicine

## 2022-01-02 VITALS — BP 108/60 | HR 86 | Temp 97.6°F | Wt 129.4 lb

## 2022-01-02 DIAGNOSIS — E1121 Type 2 diabetes mellitus with diabetic nephropathy: Secondary | ICD-10-CM

## 2022-01-02 DIAGNOSIS — I10 Essential (primary) hypertension: Secondary | ICD-10-CM | POA: Diagnosis not present

## 2022-01-02 DIAGNOSIS — E1165 Type 2 diabetes mellitus with hyperglycemia: Secondary | ICD-10-CM | POA: Diagnosis not present

## 2022-01-02 DIAGNOSIS — R6889 Other general symptoms and signs: Secondary | ICD-10-CM | POA: Diagnosis not present

## 2022-01-02 DIAGNOSIS — R634 Abnormal weight loss: Secondary | ICD-10-CM

## 2022-01-02 DIAGNOSIS — R5383 Other fatigue: Secondary | ICD-10-CM

## 2022-01-02 LAB — COMPREHENSIVE METABOLIC PANEL
ALT: 17 U/L (ref 0–35)
AST: 18 U/L (ref 0–37)
Albumin: 4.2 g/dL (ref 3.5–5.2)
Alkaline Phosphatase: 90 U/L (ref 39–117)
BUN: 20 mg/dL (ref 6–23)
CO2: 27 mEq/L (ref 19–32)
Calcium: 10 mg/dL (ref 8.4–10.5)
Chloride: 107 mEq/L (ref 96–112)
Creatinine, Ser: 1.1 mg/dL (ref 0.40–1.20)
GFR: 49.62 mL/min — ABNORMAL LOW (ref >60.00)
Glucose, Bld: 172 mg/dL — ABNORMAL HIGH (ref 70–99)
Potassium: 5.2 mEq/L — ABNORMAL HIGH (ref 3.5–5.1)
Sodium: 142 mEq/L (ref 135–145)
Total Bilirubin: 0.5 mg/dL (ref 0.2–1.2)
Total Protein: 7.3 g/dL (ref 6.0–8.3)

## 2022-01-02 LAB — CBC WITH DIFFERENTIAL/PLATELET
Basophils Absolute: 0.1 10*3/uL (ref 0.0–0.1)
Basophils Relative: 0.9 % (ref 0.0–3.0)
Eosinophils Absolute: 0.3 10*3/uL (ref 0.0–0.7)
Eosinophils Relative: 4.2 % (ref 0.0–5.0)
HCT: 40.4 % (ref 36.0–46.0)
Hemoglobin: 13 g/dL (ref 12.0–15.0)
Lymphocytes Relative: 29.5 % (ref 12.0–46.0)
Lymphs Abs: 2 10*3/uL (ref 0.7–4.0)
MCHC: 32.1 g/dL (ref 30.0–36.0)
MCV: 93.5 fl (ref 78.0–100.0)
Monocytes Absolute: 0.5 10*3/uL (ref 0.1–1.0)
Monocytes Relative: 7.3 % (ref 3.0–12.0)
Neutro Abs: 4 10*3/uL (ref 1.4–7.7)
Neutrophils Relative %: 58.1 % (ref 43.0–77.0)
Platelets: 274 10*3/uL (ref 150.0–400.0)
RBC: 4.32 Mil/uL (ref 3.87–5.11)
RDW: 13.6 % (ref 11.5–15.5)
WBC: 6.8 10*3/uL (ref 4.0–10.5)

## 2022-01-02 LAB — VITAMIN B12: Vitamin B-12: 154 pg/mL — ABNORMAL LOW (ref 211–911)

## 2022-01-02 LAB — TSH: TSH: 3.43 u[IU]/mL (ref 0.35–5.50)

## 2022-01-02 LAB — BRAIN NATRIURETIC PEPTIDE: Pro B Natriuretic peptide (BNP): 13 pg/mL (ref 0.0–100.0)

## 2022-01-02 LAB — FERRITIN: Ferritin: 77.3 ng/mL (ref 10.0–291.0)

## 2022-01-02 MED ORDER — TRULICITY 1.5 MG/0.5ML ~~LOC~~ SOAJ: 1.5000 mg | SUBCUTANEOUS | 0 refills | Status: DC

## 2022-01-02 NOTE — Assessment & Plan Note (Addendum)
Home CBGs in the 200 range.  This is partially due to poor adherence to oral medications.  Is tolerating Trulicity 8.10 mg.  Will increase to 1.5 mg.  Return in 4 weeks for check-in.  Discussed some of her symptoms may be secondary to her poorly controlled diabetes but will r/o other causes.

## 2022-01-02 NOTE — Patient Instructions (Addendum)
Send Mychart message in 3 weeks to let me know if you are tolerating the increased dose of Trulicity then we can increase the medication again.   Increase trulicity - refill sent to pharmacy  Return in 4 weeks for diabetes  Labs for fatigue work-up  Stop Lisinopril 10 mg  Monitor blood pressure - if elevated restart at 1/2 pill     Please check your blood pressure 2-4 times a week.   To check your blood pressure 1) Sit in a quiet and relaxed place for 5 minutes 2) Make sure your feet are flat on the ground 3) Consider checking first thing in the morning   Normal blood pressure is less than 140/90 Ideally you blood pressure should be around 120/80

## 2022-01-02 NOTE — Assessment & Plan Note (Signed)
Increase trulicity. Stop lisinopril temporarily to see if this improves her fatigue.  If there is improvement we will likely try to restart lisinopril at lowest tolerated dose.

## 2022-01-02 NOTE — Assessment & Plan Note (Addendum)
Several months of fatigue.  She endorses some cold intolerance we will evaluate thyroid.  She has also lost approximately 10 pounds, while this may be due to GI losses will evaluate blood counts.  She notes fatigue which is short distances but no shortness of breath.  Do think heart failure is less likely but we will get a BNP and consider additional work-up if needed.  Hold lisinopril due to lower blood pressure to see if this helps with symptoms.  She does have some pallor to her conjunctiva, will evaluate for anemia - no know blood loss.

## 2022-01-02 NOTE — Progress Notes (Signed)
Subjective:     Wanda [name] is a 76 y.o. female presenting for Blood Pressure Check (BP has been low and she is not feeling well for last couple months. States that her head feels heavy, constant runny nose, nausea, fatigue and diarrhea. Pt is scheduled to see GI on 6/22. )     HPI  #fatigue - has been feeling not well - appetite - hungry but then cannot eat when she has the food - does have some nausea - feels like her body odor has changed - does feel a little winded but no SOB - feels that activity takes more effort - feels fatigue just with short distances  Is on lisinopril 10 mg - bp has been low - otherwise has been taking the medication   No blood in stool or urine  Persistent runny nose - comes and goes following an illness - will feel fine initially  - diarrhea - 4-5 BM daily  --- worse with watermelon  #Diabetes - checking sugar at home - still >220 - taking jardiance 25 mg, metformin 5701 mg, trulicity 7.79 mg - feels worse after she takes the pills - has a hard time remembering to take her pills - does eat breakfast  Weight loss  No leg swelling  Review of Systems  Constitutional:  Negative for chills, diaphoresis and fever.  Eyes:  Negative for visual disturbance.  Gastrointestinal:  Positive for diarrhea and nausea. Negative for vomiting.  Endocrine: Positive for cold intolerance and polydipsia. Negative for heat intolerance and polyuria.  Genitourinary:        Incontinence  Neurological:  Negative for dizziness and light-headedness.       Balance concern    Social History   Tobacco Use  Smoking Status Never  Smokeless Tobacco Never        Objective:    BP Readings from Last 3 Encounters:  01/02/22 108/60  10/11/21 110/72  05/23/21 118/80   Wt Readings from Last 3 Encounters:  01/02/22 129 lb 6 oz (58.7 kg)  12/17/21 130 lb (59 kg)  10/11/21 139 lb 4 oz (63.2 kg)    BP 108/60   Pulse 86   Temp 97.6 F (36.4 C) (Temporal)    Wt 129 lb 6 oz (58.7 kg)   SpO2 97%   BMI 22.21 kg/m    Physical Exam Constitutional:      General: She is not in acute distress.    Appearance: She is well-developed. She is not diaphoretic.  HENT:     Right Ear: External ear normal. There is impacted cerumen.     Left Ear: External ear normal. There is impacted cerumen.     Ears:     Comments: Left with extremely narrow ear canal     Nose: Nose normal.  Eyes:     Comments: Conjunctival pallor   Cardiovascular:     Rate and Rhythm: Normal rate and regular rhythm.     Heart sounds: No murmur heard. Pulmonary:     Effort: Pulmonary effort is normal. No respiratory distress.     Breath sounds: Normal breath sounds. No wheezing or rales.  Musculoskeletal:        General: Normal range of motion.     Cervical back: Neck supple.  Skin:    General: Skin is warm and dry.     Capillary Refill: Capillary refill takes less than 2 seconds.  Neurological:     Mental Status: She is alert. Mental status is at  baseline.  Psychiatric:        Mood and Affect: Mood normal.        Behavior: Behavior normal.          Assessment & Plan:   Problem List Items Addressed This Visit       Cardiovascular and Mediastinum   Essential hypertension - Primary    BP normal. In light of fatigue, discussed trial of stopping lisinopril 10 mg. If elevated, restart 1/2 tablet.          Endocrine   Diabetes mellitus with renal manifestation (HCC)    Increase trulicity. Stop lisinopril temporarily to see if this improves her fatigue.  If there is improvement we will likely try to restart lisinopril at lowest tolerated dose.       Relevant Medications   Dulaglutide (TRULICITY) 1.5 DG/3.8VF SOPN   Type 2 diabetes mellitus with hyperglycemia, without long-term current use of insulin (HCC)    Home CBGs in the 200 range.  This is partially due to poor adherence to oral medications.  Is tolerating Trulicity 6.43 mg.  Will increase to 1.5 mg.  Return  in 4 weeks for check-in.  Discussed some of her symptoms may be secondary to her poorly controlled diabetes but will r/o other causes.       Relevant Medications   Dulaglutide (TRULICITY) 1.5 PI/9.5JO SOPN     Other   Other fatigue    Several months of fatigue.  She endorses some cold intolerance we will evaluate thyroid.  She has also lost approximately 10 pounds, while this may be due to GI losses will evaluate blood counts.  She notes fatigue which is short distances but no shortness of breath.  Do think heart failure is less likely but we will get a BNP and consider additional work-up if needed.  Hold lisinopril due to lower blood pressure to see if this helps with symptoms.  She does have some pallor to her conjunctiva, will evaluate for anemia - no know blood loss.        Relevant Orders   Ferritin   Vitamin B12   Brain natriuretic peptide   Other Visit Diagnoses     Cold intolerance       Relevant Orders   TSH   Weight loss       Relevant Orders   Comprehensive metabolic panel   CBC with Differential   Ferritin   Vitamin B12        Return in about 4 weeks (around 01/30/2022) for diabetes.  Lesleigh Noe, MD

## 2022-01-02 NOTE — Assessment & Plan Note (Signed)
BP normal. In light of fatigue, discussed trial of stopping lisinopril 10 mg. If elevated, restart 1/2 tablet.

## 2022-01-03 ENCOUNTER — Telehealth: Payer: Self-pay | Admitting: Family Medicine

## 2022-01-03 NOTE — Telephone Encounter (Signed)
Spoke to pt and relayed dr. Verda Cumins lab result message. Pt would like to get the monthly b12 shot but won't be able to start it until she gets back in town. Pt will call to schedule that when she returns.  Pt will send [place] her BP readings through mychart every 3-5 days while she is off of the lisinopril.

## 2022-01-03 NOTE — Telephone Encounter (Signed)
Pt called because she had received lab results through mychart and said that if a medication gets sent in she would need it before Tuesday because she is going out of town early Tuesday morning

## 2022-01-06 NOTE — Telephone Encounter (Signed)
That is fine 

## 2022-01-07 ENCOUNTER — Encounter: Payer: Self-pay | Admitting: Family Medicine

## 2022-01-07 DIAGNOSIS — E1165 Type 2 diabetes mellitus with hyperglycemia: Secondary | ICD-10-CM

## 2022-01-13 ENCOUNTER — Ambulatory Visit: Payer: Medicare PPO | Admitting: Family Medicine

## 2022-01-23 ENCOUNTER — Ambulatory Visit: Payer: Medicare PPO | Admitting: Physician Assistant

## 2022-01-23 ENCOUNTER — Encounter: Payer: Self-pay | Admitting: Physician Assistant

## 2022-01-23 VITALS — BP 124/68 | HR 60 | Ht 64.0 in | Wt 130.6 lb

## 2022-01-23 DIAGNOSIS — R630 Anorexia: Secondary | ICD-10-CM

## 2022-01-23 DIAGNOSIS — R634 Abnormal weight loss: Secondary | ICD-10-CM

## 2022-01-23 DIAGNOSIS — R1030 Lower abdominal pain, unspecified: Secondary | ICD-10-CM | POA: Diagnosis not present

## 2022-01-23 DIAGNOSIS — R110 Nausea: Secondary | ICD-10-CM

## 2022-01-23 DIAGNOSIS — R197 Diarrhea, unspecified: Secondary | ICD-10-CM | POA: Diagnosis not present

## 2022-01-23 NOTE — Patient Instructions (Addendum)
If you are age 73 or older, your body mass index should be between 23-30. Your Body mass index is 22.42 kg/m. If this is out of the aforementioned range listed, please consider follow up with your Primary Care Provider. ________________________________________________________  The [facility] providers would like to encourage you to use [facility] to communicate with providers for non-urgent requests or questions.  Due to long hold times on the telephone, sending your provider a message by [facility] may be a faster and more efficient way to get a response.  Please allow 48 business hours for a response.  Please remember that this is for non-urgent requests.  _______________________________________________________  Wanda [name] have been scheduled for a CT scan of the abdomen and pelvis at [facility], 1st floor Radiology. You are scheduled on 01/30/2022  at 8:00 am. You should arrive 15 minutes prior to your appointment time for registration.  Please pick up 2 bottles of contrast from [place] at least 3 days prior to your scan. The solution may taste better if refrigerated, but do NOT add ice or any other liquid to this solution. Shake well before drinking.   Please follow the written instructions below on the day of your exam:   1) Do not eat anything after 4:00 am (4 hours prior to your test)   2) Drink 1 bottle of contrast @ 6:00 am (2 hours prior to your exam)  Remember to shake well before drinking and do NOT pour over ice.     Drink 1 bottle of contrast @ 7:00 am (1 hour prior to your exam)   You may take any medications as prescribed with a small amount of water, if necessary. If you take any of the following medications: METFORMIN, GLUCOPHAGE, GLUCOVANCE, AVANDAMET, RIOMET, FORTAMET, [place] MET, JANUMET, GLUMETZA or METAGLIP, you MAY be asked to HOLD this medication 48 hours AFTER the exam.   The purpose of you drinking the oral contrast is to aid in the visualization of your intestinal  tract. The contrast solution may cause some diarrhea. Depending on your individual set of symptoms, you may also receive an intravenous injection of x-ray contrast/dye. Plan on being at [facility] for 45 minutes or longer, depending on the type of exam you are having performed.   If you have any questions regarding your exam or if you need to reschedule, you may call [facility] at [phone] between the hours of 8:00 am and 5:00 pm, Monday-Friday.   STOP Trulicity, follow up with your Primary Care  Use Imodium 1-2 tablets as needed for diarrhea  Stop artificial sweeteners, you can use Monia Pouch   Thank you for entrusting me with your care and choosing [facility].   Amy Esterwood, PA-C

## 2022-01-24 ENCOUNTER — Ambulatory Visit: Payer: Medicare PPO | Admitting: Family Medicine

## 2022-01-24 ENCOUNTER — Encounter: Payer: Self-pay | Admitting: Gastroenterology

## 2022-01-24 VITALS — BP 100/70 | HR 83 | Temp 96.9°F | Ht 64.0 in | Wt 128.3 lb

## 2022-01-24 DIAGNOSIS — F411 Generalized anxiety disorder: Secondary | ICD-10-CM | POA: Diagnosis not present

## 2022-01-24 DIAGNOSIS — E1165 Type 2 diabetes mellitus with hyperglycemia: Secondary | ICD-10-CM | POA: Diagnosis not present

## 2022-01-24 DIAGNOSIS — E538 Deficiency of other specified B group vitamins: Secondary | ICD-10-CM | POA: Insufficient documentation

## 2022-01-24 DIAGNOSIS — R634 Abnormal weight loss: Secondary | ICD-10-CM | POA: Insufficient documentation

## 2022-01-24 LAB — POCT GLYCOSYLATED HEMOGLOBIN (HGB A1C): Hemoglobin A1C: 8.5 % — AB (ref 4.0–5.6)

## 2022-01-24 MED ORDER — TRULICITY 3 MG/0.5ML ~~LOC~~ SOAJ: 3.0000 mg | SUBCUTANEOUS | 2 refills | Status: DC

## 2022-01-24 MED ORDER — CYANOCOBALAMIN 1000 MCG/ML IJ SOLN
1000.0000 ug | Freq: Once | INTRAMUSCULAR | Status: AC
  Administered 2022-01-24: 1000 ug via INTRAMUSCULAR

## 2022-01-28 ENCOUNTER — Telehealth: Payer: Self-pay

## 2022-01-28 NOTE — Progress Notes (Signed)
    Chronic Care Management Pharmacy Assistant   Name: Wanda [name]  MRN: [id number] DOB: 29-May-1946  Reason for Encounter: CCM (Diabetes Appointment)   Called patient to schedule a follow up Diabetes appointment with Al Corpus per Dr. Selena Batten.  Patient has been scheduled on 01/30/2022 at 2:15 for a telephone visit.   Al Corpus, CPP notified  Claudina Lick, [place] Clinical Pharmacy Assistant [phone]

## 2022-01-30 ENCOUNTER — Telehealth: Payer: Medicare PPO

## 2022-01-30 ENCOUNTER — Ambulatory Visit (HOSPITAL_COMMUNITY)
Admission: RE | Admit: 2022-01-30 | Discharge: 2022-01-30 | Disposition: A | Discharge status: Home / Self Care | Payer: Medicare PPO | Source: Ambulatory Visit | Attending: Physician Assistant | Admitting: Physician Assistant

## 2022-01-30 DIAGNOSIS — R630 Anorexia: Secondary | ICD-10-CM | POA: Insufficient documentation

## 2022-01-30 DIAGNOSIS — R110 Nausea: Secondary | ICD-10-CM | POA: Insufficient documentation

## 2022-01-30 DIAGNOSIS — R197 Diarrhea, unspecified: Secondary | ICD-10-CM | POA: Diagnosis not present

## 2022-01-30 DIAGNOSIS — R1030 Lower abdominal pain, unspecified: Secondary | ICD-10-CM | POA: Insufficient documentation

## 2022-01-30 DIAGNOSIS — R634 Abnormal weight loss: Secondary | ICD-10-CM | POA: Insufficient documentation

## 2022-01-30 MED ORDER — SODIUM CHLORIDE (PF) 0.9 % IJ SOLN
INTRAMUSCULAR | Status: AC
  Filled 2022-01-30: qty 50

## 2022-01-30 MED ORDER — IOHEXOL 300 MG/ML  SOLN
80.0000 mL | Freq: Once | INTRAMUSCULAR | Status: AC | PRN
  Administered 2022-01-30: 80 mL via INTRAVENOUS

## 2022-01-30 NOTE — Progress Notes (Addendum)
Called patient to reschedule her DM follow up for 1 month with Charlene Brooke. Patient has been rescheduled for 02/25/2022 at 10:00 for a telephone appointment.  Charlene Brooke, CPP notified  Marijean Niemann, [place] Clinical Pharmacy Assistant [phone]

## 2022-01-30 NOTE — Progress Notes (Deleted)
Chronic Care Management Pharmacy Note  01/30/2022 Name:  Shelina Luo MRN:  [id number] DOB:  28-Jun-1946  Summary: -1 month follow up: pt reports reducing metformin to 1000 mg/day has significantly improved GI symptoms; BG is slightly worse (fasting 150-190, post-prandial 180-240) -Pt has been on glipizide on-and-off for ~10 years; benefit is likely wearing off at this point. Discussed possible medication change in future and pt is amenable to switching to a GLP-1 injection  Recommendations/Changes made from today's visit: -Scheduled PCP f/u 2/28 - if A1c >8%,  recommended to change glipizide to Trulicity 9.16 mg weekly  Plan: -PCP f/u appt 10/01/21 -Pharmacist follow up televisit scheduled for 2 months   Subjective: Wanda [name] is an 76 y.o. year old female who is a primary patient of Cody, Jobe Marker, MD.  The CCM team was consulted for assistance with disease management and care coordination needs.    Engaged with patient by telephone for follow up visit in response to provider referral for pharmacy case management and/or care coordination services.   Consent to Services:  The patient was given information about Chronic Care Management services, agreed to services, and gave verbal consent prior to initiation of services.  Please see initial visit note for detailed documentation.   Patient Care Team: Lesleigh Noe, MD as PCP - General (Family Medicine) Katy Apo, MD as Consulting Physician (Ophthalmology) [facility], Melanie Crazier, MD as Consulting Physician (Endocrinology) Willia Craze, NP as Nurse Practitioner (Gastroenterology) Charlton Haws, [facility] as Pharmacist (Pharmacist)  Recent office visits: 05/23/2021 - [name], MD - Patient presented for follow up for diabetes. Continue Buspar 7.5 mg BID due to significant improvement. Change: Decrease Lisinopril 10 > 68m due to orthostatic hypotension.   04/18/2021 - [name] MD - Patient presented for  diabetes follow up. Referral to Gastroenterology due to nausea and difficulty swallowing. Change: switch glipizide 5 mg bid to glipizide XL 10 mg daily. Change: Increase jardiance to 25 mg. Change: Increase buspar 3.75 > 7.5 mg and to 15 mg daily if no improvement after 2 weeks for anxiety.   Recent consult visits: 07/23/21 Dr SCandiss Norse(Nephrology): f/u CKD. Stable. No changes. F/U 1 year.  04/02/2021 - SJohny Drilling RN - Nutrition - Patient presented for diabetes. Recommended: Check blood sugars 1 x day before breakfast or 2 hrs after supper every day.   02/25/2021 - TCasimiro Needle RN - Urology - Telephone - Start: cephalexin ([facility] 500 MG capsule for 5 days.   02/20/2021 - CMaryland Pink- Urology - Patient presented for urinary frequency and prolapse of vaginal wall. No other information provided.   02/19/2021 - SJohny Drilling RN - Nutrition - Patient presented for diabetes. Recommended: Eat 3 meals day, 1-2  snacks a day, space meals 4-6 hours apart, don't skip meals and limit fried foods, desserts and sweets. Complete 3 Day Food Record and bring to next appt.   Hospital visits: None in previous 6 months   Objective:  Lab Results  Component Value Date   CREATININE 1.10 01/02/2022   BUN 20 01/02/2022   GFR 49.62 (L) 01/02/2022   GFRNONAA 57 (L) 06/23/2014   GFRAA 67 (L) 06/23/2014   NA 142 01/02/2022   K 5.2 No hemolysis seen (H) 01/02/2022   CALCIUM 10.0 01/02/2022   CO2 27 01/02/2022   GLUCOSE 172 (H) 01/02/2022    Lab Results  Component Value Date/Time   HGBA1C 8.5 (A) 01/24/2022 10:26 AM   HGBA1C 9.6 (A) 10/11/2021 10:49 AM  HGBA1C 9.3 (H) 07/06/2020 02:33 PM   HGBA1C 8.0 (H) 08/23/2019 02:36 PM   HGBA1C 7.7 08/25/2018 10:28 AM   GFR 49.62 (L) 01/02/2022 10:04 AM   GFR 61.34 07/06/2020 02:33 PM   MICROALBUR 1.3 10/02/2014 03:01 PM   MICROALBUR 1.9 08/05/2013 09:20 AM    Last diabetic Eye exam:  Lab Results  Component Value Date/Time   HMDIABEYEEXA No  Retinopathy 11/01/2021 12:00 AM    Last diabetic Foot exam: No results found for: "HMDIABFOOTEX"   Lab Results  Component Value Date   CHOL 181 07/06/2020   HDL 39.30 07/06/2020   LDLCALC 84 05/05/2017   LDLDIRECT 96.0 07/06/2020   TRIG 279.0 (H) 07/06/2020   CHOLHDL 5 07/06/2020       Latest Ref Rng & Units 01/02/2022   10:04 AM 07/16/2021   12:00 AM 07/06/2020    2:33 PM  Hepatic Function  Total Protein 6.0 - 8.3 g/dL 7.3   7.4   Albumin 3.5 - 5.2 g/dL 4.2  4.3     4.2   AST 0 - 37 U/L 18   32   ALT 0 - 35 U/L 17   34   Alk Phosphatase 39 - 117 U/L 90   90   Total Bilirubin 0.2 - 1.2 mg/dL 0.5   0.4      This result is from an external source.    Lab Results  Component Value Date/Time   TSH 3.43 01/02/2022 10:04 AM   TSH 3.57 08/23/2019 02:36 PM       Latest Ref Rng & Units 01/02/2022   10:04 AM 07/16/2021   12:00 AM 07/06/2020    2:33 PM  CBC  WBC 4.0 - 10.5 K/uL 6.8  7.2     7.8   Hemoglobin 12.0 - 15.0 g/dL 13.0  13.2     12.8   Hematocrit 36.0 - 46.0 % 40.4  40     38.8   Platelets 150.0 - 400.0 K/uL 274.0  283     291.0      This result is from an external source.    Lab Results  Component Value Date/Time   VD25OH 21.70 (L) 08/23/2019 02:36 PM    Clinical ASCVD: No  The 10-year ASCVD risk score (Arnett DK, et al., 2019) is: 21.9%   Values used to calculate the score:     Age: 35 years     Sex: Female     Is Non-Hispanic African American: No     Diabetic: Yes     Tobacco smoker: No     Systolic Blood Pressure: 696 mmHg     Is BP treated: Yes     HDL Cholesterol: 39.3 mg/dL     Total Cholesterol: 181 mg/dL       12/17/2021    2:51 PM 05/23/2021   12:52 PM 02/19/2021    2:04 PM  Depression screen PHQ 2/9  Decreased Interest 0 1 0  Down, Depressed, Hopeless 0 0 0  PHQ - 2 Score 0 1 0  Altered sleeping  1   Tired, decreased energy  1   Change in appetite  1   Feeling bad or failure about yourself   0   Trouble concentrating  0   Moving  slowly or fidgety/restless  0   Suicidal thoughts  0   PHQ-9 Score  4   Difficult doing work/chores  Not difficult at all      Social History   Tobacco Use  Smoking Status Never  Smokeless Tobacco Never   BP Readings from Last 3 Encounters:  01/24/22 100/70  01/23/22 124/68  01/02/22 108/60   Pulse Readings from Last 3 Encounters:  01/24/22 83  01/23/22 60  01/02/22 86   Wt Readings from Last 3 Encounters:  01/24/22 128 lb 5 oz (58.2 kg)  01/23/22 130 lb 9.6 oz (59.2 kg)  01/02/22 129 lb 6 oz (58.7 kg)   BMI Readings from Last 3 Encounters:  01/24/22 22.02 kg/m  01/23/22 22.42 kg/m  01/02/22 22.21 kg/m    Assessment/Interventions: Review of patient past medical history, allergies, medications, health status, including review of consultants reports, laboratory and other test data, was performed as part of comprehensive evaluation and provision of chronic care management services.   SDOH:  (Social Determinants of Health) assessments and interventions performed: Yes   SDOH Screenings   Alcohol Screen: Low Risk  (12/17/2021)   Alcohol Screen    Last Alcohol Screening Score (AUDIT): 0  Depression (PHQ2-9): Low Risk  (12/17/2021)   Depression (PHQ2-9)    PHQ-2 Score: 0  Financial Resource Strain: Low Risk  (12/17/2021)   Overall Financial Resource Strain (CARDIA)    Difficulty of Paying Living Expenses: Not hard at all  Food Insecurity: No Food Insecurity (12/17/2021)   Hunger Vital Sign    Worried About Running Out of Food in the Last Year: Never true    Ran Out of Food in the Last Year: Never true  Housing: Low Risk  (12/17/2021)   Housing    Last Housing Risk Score: 0  Physical Activity: Unknown (12/17/2021)   Exercise Vital Sign    Days of Exercise per Week: Patient refused    Minutes of Exercise per Session: Patient refused  Social Connections: Moderately Integrated (12/17/2021)   Social Connection and Isolation Panel [NHANES]    Frequency of Communication  with Friends and Family: More than three times a week    Frequency of Social Gatherings with Friends and Family: More than three times a week    Attends Religious Services: More than 4 times per year    Active Member of [employer] or Organizations: Yes    Attends Archivist Meetings: More than 4 times per year    Marital Status: Widowed  Stress: No Stress Concern Present (12/17/2021)   [place]    Feeling of Stress : Not at all  Tobacco Use: Low Risk  (01/23/2022)   Patient History    Smoking Tobacco Use: Never    Smokeless Tobacco Use: Never    Passive Exposure: Not on file  Transportation Needs: No Transportation Needs (12/17/2021)   PRAPARE - Transportation    Lack of Transportation (Medical): No    Lack of Transportation (Non-Medical): No    CCM Care Plan  Allergies  Allergen Reactions   Niacin     REACTION: unbearable hot flashes   Sulfamethoxazole-Trimethoprim Itching    Medications Reviewed Today     Reviewed by Loreen Freud, CMA (Certified Medical Assistant) on 01/24/22 at 67  Med List Status: <None>   Medication Order Taking? Sig Documenting Provider Last Dose Status Informant  Accu-Chek Softclix Lancets lancets [id number] Yes 2 (two) times daily. [provider] Taking Active   acetaminophen (TYLENOL) 325 MG tablet [id number] Yes Take 2 tablets by mouth as needed. [provider] Taking Active   Blood Glucose Monitoring Suppl (ACCU-CHEK GUIDE ME) w/Device KIT [id number] Yes USE AS DIRECTED TO CHECK  BLOOD SUGAR TWICE DAILY [provider] Taking Active   busPIRone (BUSPAR) 15 MG tablet [id number] Yes Take 0.5 tablets (7.5 mg total) by mouth 2 (two) times daily.  Patient taking differently: Take 7.5 mg by mouth daily at 12 noon. PRN   Lesleigh Noe, MD Taking Active   Dulaglutide (TRULICITY) 1.5 JZ/7.9XT Bonney Aid [id number] Yes Inject 1.5 mg into the skin once a week. Lesleigh Noe, MD Taking Active   empagliflozin (JARDIANCE) 25 MG TABS tablet [id number] Yes Take 1 tablet (25 mg total) by mouth daily before breakfast. Lesleigh Noe, MD Taking Active   glucose blood (ACCU-CHEK AVIVA PLUS) test strip [id number] Yes Use as instructed to test blood sugar 2 times daily E11.45 [place], Melanie Crazier, MD Taking Active   glucose blood test strip [id number] Yes 1 each by Other route 2 (two) times daily. Use as instructed to check blood sugar 2 times daily with OneTouch meter. Lesleigh Noe, MD Taking Active   metFORMIN (GLUCOPHAGE-XR) 500 MG 24 hr tablet [id number] Yes Take 2 tablets (1,000 mg total) by mouth daily with breakfast. (2 tablets) Lesleigh Noe, MD Taking Active   omeprazole (PRILOSEC) 20 MG capsule [id number] Yes Take 1 capsule (20 mg total) by mouth every other day. Lesleigh Noe, MD Taking Active   pravastatin (PRAVACHOL) 20 MG tablet [id number] Yes TAKE 1 TABLET BY MOUTH DAILY WITH EVENING MEAL Lesleigh Noe, MD Taking Active             Patient Active Problem List   Diagnosis Date Noted   B12 deficiency 01/24/2022   Weight loss 01/24/2022   Other fatigue 01/02/2022   Postural dizziness 05/23/2021   Generalized anxiety disorder 04/18/2021   Muscle spasm 04/18/2021   Diarrhea 01/08/2021   Osteopenia 01/01/2021   Thumb pain, left 08/21/2020   Shortness of breath 07/09/2020   Tinnitus of both ears 07/09/2020   Type 2 diabetes mellitus with hyperglycemia, without long-term current use of insulin ([place]) 10/03/2019   Type 2 diabetes mellitus with stage 3a chronic kidney disease, without long-term current use of insulin ([place]) 10/03/2019   Disease related peripheral neuropathy 08/22/2019   Urinary frequency 05/18/2019   Stress incontinence of urine 02/21/2019   Grief 07/15/2018   Pelvic prolapse 01/22/2017   Hyperlipidemia associated with type 2 diabetes mellitus ([place]) 05/03/2014   GERD (gastroesophageal reflux disease) 12/05/2013   CKD  (chronic kidney disease) stage 3, GFR 30-59 ml/min ([place]) 11/27/2011   HYPERKALEMIA 11/30/2009   INSOMNIA 11/21/2009   FATIGUE 11/21/2009   Diabetes mellitus with renal manifestation ([place]) 07/25/2009   Essential hypertension 07/25/2009   COLONIC POLYPS, HX OF 07/25/2009    Immunization History  Administered Date(s) Administered   Influenza Split 06/03/2012   Influenza,inj,Quad PF,6+ Mos 08/10/2013, 05/03/2014, 05/06/2017, 07/15/2018, 05/18/2019   PFIZER(Purple Top)SARS-COV-2 Vaccination 09/18/2019, 10/11/2019   PPD Test 04/19/2013   Pneumococcal Conjugate-13 10/02/2014   Pneumococcal Polysaccharide-23 04/25/2013   Td 08/19/2007    Conditions to be addressed/monitored:  Hypertension, Hyperlipidemia, Diabetes, GERD, Anxiety, and Osteopenia  There are no care plans that you recently modified to display for this patient.      Medication Assistance: None required.  Patient affirms current coverage meets needs.  Compliance/Adherence/Medication fill history: Care Gaps: Cologuard (due 04/26/21) AWV due  Star-Rating Drugs: Medication:                Last Fill:         Day Supply   Patient's preferred pharmacy is:  [facility], [place] [address] Phone: [phone] Fax: [phone]  Uses pill box? Yes Pt endorses 80% compliance  We discussed: Current pharmacy is preferred with insurance plan and patient is satisfied with pharmacy services Patient decided to: Continue current medication management strategy  Care Plan and Follow Up Patient Decision:  Patient agrees to Care Plan and Follow-up.  Plan: Telephone follow up appointment with care management team member scheduled for:  2 months  Charlene Brooke, PharmD, [facility] Clinical Pharmacist [facility] [phone]

## 2022-02-13 NOTE — Telephone Encounter (Signed)
Routing to pharmacy for additional support.   GI had patient stop trulicity due to possible side effects.   Pt with adherence challenges on oral and daily agents. Any suggestions for alternatives?   I sent a mychart to find out if she stopped and advise consideration for insulin

## 2022-02-18 NOTE — Telephone Encounter (Signed)
I was waiting to see if patient would respond about stopping Trulicity/resolution of GI side effects.  I would probably avoid GLP1-RA class since she appears to have had issues with appetite loss, weight loss (this is a known and often desirable effect of GLP1-RA, however in her case her BMI is already low-normal and other agents will have the same effect).  Can consider SGLT2-inhibitor, although this is not likely to achieve A1c < 8% especially with adherence issues. I do agree insulin is our best option at this point. A long acting insulin like Tyler Aas would be preferable given ~48 hr duration of action, which may help some if she is not 100% adherent.   I have a phone appt with patient coming up next week and can discuss options then.

## 2022-02-18 NOTE — Telephone Encounter (Signed)
Agree with pharmacy recommendation for plan for insulin.

## 2022-02-20 ENCOUNTER — Telehealth: Payer: Self-pay

## 2022-02-20 NOTE — Progress Notes (Signed)
    Chronic Care Management Pharmacy Assistant   Name: Rozena Fierro  MRN: [id number] DOB: Jun 03, 1946  Reason for Encounter: CCM (Appointment Reminder)  Medications: Outpatient Encounter Medications as of 02/20/2022  Medication Sig   Accu-Chek Softclix Lancets lancets 2 (two) times daily.   acetaminophen (TYLENOL) 325 MG tablet Take 2 tablets by mouth as needed.   Blood Glucose Monitoring Suppl (ACCU-CHEK GUIDE ME) w/Device KIT USE AS DIRECTED TO CHECK BLOOD SUGAR TWICE DAILY   busPIRone (BUSPAR) 15 MG tablet Take 0.5 tablets (7.5 mg total) by mouth 2 (two) times daily. (Patient taking differently: Take 7.5 mg by mouth daily at 12 noon. PRN)   Dulaglutide (TRULICITY) 3 FX/8.3AN SOPN Inject 3 mg as directed once a week.   empagliflozin (JARDIANCE) 25 MG TABS tablet Take 1 tablet (25 mg total) by mouth daily before breakfast.   glucose blood (ACCU-CHEK AVIVA PLUS) test strip Use as instructed to test blood sugar 2 times daily E11.45   glucose blood test strip 1 each by Other route 2 (two) times daily. Use as instructed to check blood sugar 2 times daily with OneTouch meter.   metFORMIN (GLUCOPHAGE-XR) 500 MG 24 hr tablet Take 2 tablets (1,000 mg total) by mouth daily with breakfast. (2 tablets)   omeprazole (PRILOSEC) 20 MG capsule Take 1 capsule (20 mg total) by mouth every other day.   pravastatin (PRAVACHOL) 20 MG tablet TAKE 1 TABLET BY MOUTH DAILY WITH EVENING MEAL   No facility-administered encounter medications on file as of 02/20/2022.   Lindsey Demonte was contacted to remind of upcoming telephone visit with Charlene Brooke  on 02/25/2022 at 10:15. Patient was reminded to have any blood glucose and blood press/ure readings available for review at appointment.    Patient confirmed appointment.   Are you having any problems with your medications? No   Do you have any concerns you like to discuss with the pharmacist? No   CCM referral has been placed prior to visit?  Yes    Star  Rating Drugs: Medication:  Last Fill: Day Supply Trulicity 3 mg  19/16/60 28 Metformin 500 mg 01/20/22 30  Pravastatin 20 mg 12/23/21 [place], CPP notified  Marijean Niemann, [place] Pharmacy Assistant [phone]

## 2022-02-25 ENCOUNTER — Ambulatory Visit (INDEPENDENT_AMBULATORY_CARE_PROVIDER_SITE_OTHER): Payer: Medicare PPO | Admitting: Pharmacist

## 2022-02-25 ENCOUNTER — Telehealth: Payer: Self-pay | Admitting: Family Medicine

## 2022-02-25 DIAGNOSIS — E1165 Type 2 diabetes mellitus with hyperglycemia: Secondary | ICD-10-CM

## 2022-02-25 DIAGNOSIS — I10 Essential (primary) hypertension: Secondary | ICD-10-CM

## 2022-02-25 DIAGNOSIS — E1169 Type 2 diabetes mellitus with other specified complication: Secondary | ICD-10-CM

## 2022-02-25 NOTE — Progress Notes (Signed)
Chronic Care Management Pharmacy Note  03/03/2022 Name:  Wanda [name] MRN:  [id number] DOB:  July 20, 1946  Summary: CCM F/U visit -DM: A1c 8.5% (01/2022); pt has been having side effects with Trulicity (vomiting, weight loss, constipation) and after discussion with PCP has decided to stop Trulicity and start insulin. She also wants to get a CGM  Recommendations/Changes made from today's visit: -Agree with PCP plan: stop Trulicity and start Tresiba 10 units daily -Order CGM through DME  Plan: -Pharmacist follow up televisit scheduled for 2 weeks -PCP F/U 04/29/22     Subjective: Wanda [name] is an 76 y.o. year old female who is a primary patient of Cody, Jobe Marker, MD.  The CCM team was consulted for assistance with disease management and care coordination needs.    Engaged with patient by telephone for follow up visit in response to provider referral for pharmacy case management and/or care coordination services.   Consent to Services:  The patient was given information about Chronic Care Management services, agreed to services, and gave verbal consent prior to initiation of services.  Please see initial visit note for detailed documentation.   Patient Care Team: Lesleigh Noe, MD as PCP - General (Family Medicine) Katy Apo, MD as Consulting Physician (Ophthalmology) [facility], Melanie Crazier, MD as Consulting Physician (Endocrinology) Willia Craze, NP as Nurse Practitioner (Gastroenterology) Charlton Haws, [facility] as Pharmacist (Pharmacist)  Recent office visits: 01/24/22 Dr Einar Pheasant OV: f/u DM - A1c 8.5%; increase Trulicity to 3 mg. F/u 3 months. Encouraged Buspar BID as prescribed. Referred for therapy. Pt lost 8-10 lbs. Declined nutrition referral.  05/23/2021 - [name], MD - Patient presented for follow up for diabetes. Continue Buspar 7.5 mg BID due to significant improvement. Change: Decrease Lisinopril 10 > 62m due to orthostatic hypotension.   04/18/2021  - [name] MD - Patient presented for diabetes follow up. Referral to Gastroenterology due to nausea and difficulty swallowing. Change: switch glipizide 5 mg bid to glipizide XL 10 mg daily. Change: Increase jardiance to 25 mg. Change: Increase buspar 3.75 > 7.5 mg and to 15 mg daily if no improvement after 2 weeks for anxiety.   Recent consult visits: 01/23/22 PA Amy Esterwood (GI): weight loss - 6-7 mo hx of diarrhea. Decrease in appetite 3-4 mos. Asked pt to stop Trulicity - 1 month trial off.  07/23/21 Dr SCandiss Norse(Nephrology): f/u CKD. Stable. No changes. F/U 1 year.  04/02/2021 - SJohny Drilling RN - Nutrition - Patient presented for diabetes. Recommended: Check blood sugars 1 x day before breakfast or 2 hrs after supper every day.   02/25/2021 - TCasimiro Needle RN - Urology - Telephone - Start: cephalexin ([facility] 500 MG capsule for 5 days.   02/20/2021 - CMaryland Pink- Urology - Patient presented for urinary frequency and prolapse of vaginal wall. No other information provided.   02/19/2021 - SJohny Drilling RN - Nutrition - Patient presented for diabetes. Recommended: Eat 3 meals day, 1-2  snacks a day, space meals 4-6 hours apart, don't skip meals and limit fried foods, desserts and sweets. Complete 3 Day Food Record and bring to next appt.   Hospital visits: None in previous 6 months   Objective:  Lab Results  Component Value Date   CREATININE 1.10 01/02/2022   BUN 20 01/02/2022   GFR 49.62 (L) 01/02/2022   GFRNONAA 57 (L) 06/23/2014   GFRAA 67 (L) 06/23/2014   NA 142 01/02/2022   K 5.2 No hemolysis seen (H) 01/02/2022  CALCIUM 10.0 01/02/2022   CO2 27 01/02/2022   GLUCOSE 172 (H) 01/02/2022    Lab Results  Component Value Date/Time   HGBA1C 8.5 (A) 01/24/2022 10:26 AM   HGBA1C 9.6 (A) 10/11/2021 10:49 AM   HGBA1C 9.3 (H) 07/06/2020 02:33 PM   HGBA1C 8.0 (H) 08/23/2019 02:36 PM   HGBA1C 7.7 08/25/2018 10:28 AM   GFR 49.62 (L) 01/02/2022 10:04 AM   GFR  61.34 07/06/2020 02:33 PM   MICROALBUR 1.3 10/02/2014 03:01 PM   MICROALBUR 1.9 08/05/2013 09:20 AM    Last diabetic Eye exam:  Lab Results  Component Value Date/Time   HMDIABEYEEXA No Retinopathy 11/01/2021 12:00 AM    Last diabetic Foot exam: No results found for: "HMDIABFOOTEX"   Lab Results  Component Value Date   CHOL 181 07/06/2020   HDL 39.30 07/06/2020   LDLCALC 84 05/05/2017   LDLDIRECT 96.0 07/06/2020   TRIG 279.0 (H) 07/06/2020   CHOLHDL 5 07/06/2020       Latest Ref Rng & Units 01/02/2022   10:04 AM 07/16/2021   12:00 AM 07/06/2020    2:33 PM  Hepatic Function  Total Protein 6.0 - 8.3 g/dL 7.3   7.4   Albumin 3.5 - 5.2 g/dL 4.2  4.3     4.2   AST 0 - 37 U/L 18   32   ALT 0 - 35 U/L 17   34   Alk Phosphatase 39 - 117 U/L 90   90   Total Bilirubin 0.2 - 1.2 mg/dL 0.5   0.4      This result is from an external source.    Lab Results  Component Value Date/Time   TSH 3.43 01/02/2022 10:04 AM   TSH 3.57 08/23/2019 02:36 PM       Latest Ref Rng & Units 01/02/2022   10:04 AM 07/16/2021   12:00 AM 07/06/2020    2:33 PM  CBC  WBC 4.0 - 10.5 K/uL 6.8  7.2     7.8   Hemoglobin 12.0 - 15.0 g/dL 13.0  13.2     12.8   Hematocrit 36.0 - 46.0 % 40.4  40     38.8   Platelets 150.0 - 400.0 K/uL 274.0  283     291.0      This result is from an external source.    Lab Results  Component Value Date/Time   VD25OH 21.70 (L) 08/23/2019 02:36 PM    Clinical ASCVD: No  The 10-year ASCVD risk score (Arnett DK, et al., 2019) is: 21.9%   Values used to calculate the score:     Age: 74 years     Sex: Female     Is Non-Hispanic African American: No     Diabetic: Yes     Tobacco smoker: No     Systolic Blood Pressure: 376 mmHg     Is BP treated: Yes     HDL Cholesterol: 39.3 mg/dL     Total Cholesterol: 181 mg/dL       12/17/2021    2:51 PM 05/23/2021   12:52 PM 02/19/2021    2:04 PM  Depression screen PHQ 2/9  Decreased Interest 0 1 0  Down, Depressed, Hopeless 0  0 0  PHQ - 2 Score 0 1 0  Altered sleeping  1   Tired, decreased energy  1   Change in appetite  1   Feeling bad or failure about yourself   0   Trouble concentrating  0  Moving slowly or fidgety/restless  0   Suicidal thoughts  0   PHQ-9 Score  4   Difficult doing work/chores  Not difficult at all      Social History   Tobacco Use  Smoking Status Never  Smokeless Tobacco Never   BP Readings from Last 3 Encounters:  01/24/22 100/70  01/23/22 124/68  01/02/22 108/60   Pulse Readings from Last 3 Encounters:  01/24/22 83  01/23/22 60  01/02/22 86   Wt Readings from Last 3 Encounters:  01/24/22 128 lb 5 oz (58.2 kg)  01/23/22 130 lb 9.6 oz (59.2 kg)  01/02/22 129 lb 6 oz (58.7 kg)   BMI Readings from Last 3 Encounters:  01/24/22 22.02 kg/m  01/23/22 22.42 kg/m  01/02/22 22.21 kg/m    Assessment/Interventions: Review of patient past medical history, allergies, medications, health status, including review of consultants reports, laboratory and other test data, was performed as part of comprehensive evaluation and provision of chronic care management services.   SDOH:  (Social Determinants of Health) assessments and interventions performed: Yes   SDOH Screenings   Alcohol Screen: Low Risk  (12/17/2021)   Alcohol Screen    Last Alcohol Screening Score (AUDIT): 0  Depression (PHQ2-9): Low Risk  (12/17/2021)   Depression (PHQ2-9)    PHQ-2 Score: 0  Financial Resource Strain: Low Risk  (12/17/2021)   Overall Financial Resource Strain (CARDIA)    Difficulty of Paying Living Expenses: Not hard at all  Food Insecurity: No Food Insecurity (12/17/2021)   Hunger Vital Sign    Worried About Running Out of Food in the Last Year: Never true    Ran Out of Food in the Last Year: Never true  Housing: Low Risk  (12/17/2021)   Housing    Last Housing Risk Score: 0  Physical Activity: Unknown (12/17/2021)   Exercise Vital Sign    Days of Exercise per Week: Patient refused     Minutes of Exercise per Session: Patient refused  Social Connections: Moderately Integrated (12/17/2021)   Social Connection and Isolation Panel [NHANES]    Frequency of Communication with Friends and Family: More than three times a week    Frequency of Social Gatherings with Friends and Family: More than three times a week    Attends Religious Services: More than 4 times per year    Active Member of [employer] or Organizations: Yes    Attends Archivist Meetings: More than 4 times per year    Marital Status: Widowed  Stress: No Stress Concern Present (12/17/2021)   [place]    Feeling of Stress : Not at all  Tobacco Use: Low Risk  (01/23/2022)   Patient History    Smoking Tobacco Use: Never    Smokeless Tobacco Use: Never    Passive Exposure: Not on file  Transportation Needs: No Transportation Needs (12/17/2021)   PRAPARE - Transportation    Lack of Transportation (Medical): No    Lack of Transportation (Non-Medical): No    CCM Care Plan  Allergies  Allergen Reactions   Niacin     REACTION: unbearable hot flashes   Sulfamethoxazole-Trimethoprim Itching    Medications Reviewed Today     Reviewed by Loreen Freud, CMA (Certified Medical Assistant) on 01/24/22 at 50  Med List Status: <None>   Medication Order Taking? Sig Documenting Provider Last Dose Status Informant  Accu-Chek Softclix Lancets lancets [id number] Yes 2 (two) times daily. [provider] Taking  Active   acetaminophen (TYLENOL) 325 MG tablet [id number] Yes Take 2 tablets by mouth as needed. [provider] Taking Active   Blood Glucose Monitoring Suppl (ACCU-CHEK GUIDE ME) w/Device KIT [id number] Yes USE AS DIRECTED TO CHECK BLOOD SUGAR TWICE DAILY [provider] Taking Active   busPIRone (BUSPAR) 15 MG tablet [id number] Yes Take 0.5 tablets (7.5 mg total) by mouth 2 (two) times daily.  Patient taking differently:  Take 7.5 mg by mouth daily at 12 noon. PRN   Lesleigh Noe, MD Taking Active   Dulaglutide (TRULICITY) 1.5 SE/8.3TD Bonney Aid [id number] Yes Inject 1.5 mg into the skin once a week. Lesleigh Noe, MD Taking Active   empagliflozin (JARDIANCE) 25 MG TABS tablet [id number] Yes Take 1 tablet (25 mg total) by mouth daily before breakfast. Lesleigh Noe, MD Taking Active   glucose blood (ACCU-CHEK AVIVA PLUS) test strip [id number] Yes Use as instructed to test blood sugar 2 times daily E11.45 Shamleffer, Melanie Crazier, MD Taking Active   glucose blood test strip [id number] Yes 1 each by Other route 2 (two) times daily. Use as instructed to check blood sugar 2 times daily with OneTouch meter. Lesleigh Noe, MD Taking Active   metFORMIN (GLUCOPHAGE-XR) 500 MG 24 hr tablet [id number] Yes Take 2 tablets (1,000 mg total) by mouth daily with breakfast. (2 tablets) Lesleigh Noe, MD Taking Active   omeprazole (PRILOSEC) 20 MG capsule [id number] Yes Take 1 capsule (20 mg total) by mouth every other day. Lesleigh Noe, MD Taking Active   pravastatin (PRAVACHOL) 20 MG tablet [id number] Yes TAKE 1 TABLET BY MOUTH DAILY WITH EVENING MEAL Lesleigh Noe, MD Taking Active             Patient Active Problem List   Diagnosis Date Noted   B12 deficiency 01/24/2022   Weight loss 01/24/2022   Other fatigue 01/02/2022   Postural dizziness 05/23/2021   Generalized anxiety disorder 04/18/2021   Muscle spasm 04/18/2021   Diarrhea 01/08/2021   Osteopenia 01/01/2021   Thumb pain, left 08/21/2020   Shortness of breath 07/09/2020   Tinnitus of both ears 07/09/2020   Type 2 diabetes mellitus with hyperglycemia, without long-term current use of insulin ([place]) 10/03/2019   Type 2 diabetes mellitus with stage 3a chronic kidney disease, without long-term current use of insulin ([place]) 10/03/2019   Disease related peripheral neuropathy 08/22/2019   Urinary frequency 05/18/2019   Stress incontinence of urine 02/21/2019    Grief 07/15/2018   Pelvic prolapse 01/22/2017   Hyperlipidemia associated with type 2 diabetes mellitus ([place]) 05/03/2014   GERD (gastroesophageal reflux disease) 12/05/2013   CKD (chronic kidney disease) stage 3, GFR 30-59 ml/min ([place]) 11/27/2011   HYPERKALEMIA 11/30/2009   INSOMNIA 11/21/2009   FATIGUE 11/21/2009   Diabetes mellitus with renal manifestation ([place]) 07/25/2009   Essential hypertension 07/25/2009   COLONIC POLYPS, HX OF 07/25/2009    Immunization History  Administered Date(s) Administered   Influenza Split 06/03/2012   Influenza,inj,Quad PF,6+ Mos 08/10/2013, 05/03/2014, 05/06/2017, 07/15/2018, 05/18/2019   PFIZER(Purple Top)SARS-COV-2 Vaccination 09/18/2019, 10/11/2019   PPD Test 04/19/2013   Pneumococcal Conjugate-13 10/02/2014   Pneumococcal Polysaccharide-23 04/25/2013   Td 08/19/2007    Conditions to be addressed/monitored:  Hypertension, Hyperlipidemia, Diabetes, GERD, Anxiety, and Osteopenia  Care Plan : [place]  Updates made by Charlton Haws, [place] since 03/03/2022 12:00 AM     Problem: Hypertension, Hyperlipidemia, Diabetes, GERD, Anxiety, and Osteopenia      Long-Range Goal:  Disease mgmt   Start Date: 07/19/2021  Expected End Date: 07/19/2022  This Visit's Progress: On track  Recent Progress: On track  Priority: High  Note:   Current Barriers:  Unable to independently monitor therapeutic efficacy Unable to achieve control of Diabetes  Does not adhere to prescribed medication regimen  Pharmacist Clinical Goal(s):  Patient will achieve adherence to monitoring guidelines and medication adherence to achieve therapeutic efficacy achieve improvement in diabetes as evidenced by A1c < 8% adhere to prescribed medication regimen as evidenced by pt report through collaboration with PharmD and provider.   Interventions: 1:1 collaboration with Lesleigh Noe, MD regarding development and update of comprehensive plan of care as  evidenced by provider attestation and co-signature Inter-disciplinary care team collaboration (see longitudinal plan of care) Comprehensive medication review performed; medication list updated in electronic medical record  Hypertension (BP goal <130/80) -Controlled - BP fluctuates at home; pt is off medication now; Pt reports drinking plenty of water lately ("more water than I've ever drank in my life") -Home BP: 95/59 (standing), 142/73 (sitting) -Current treatment: None -Medications previously tried: lisinopril (low BP) -Educated on BP goals and benefits of medications for prevention of heart attack, stroke and kidney damage; Importance of home blood pressure monitoring; -Counseled to monitor BP at home weekly -Recommended to continue current medication  Hyperlipidemia: (LDL goal < 100) -Query controlled - LDL 96 (07/2020), overdue for repeat lipid panel; pt endorses compliance with statin -Current treatment: Pravastatin 20 mg daily - Appropriate, Effective, Safe, Accessible -Educated on Cholesterol goals; Benefits of statin for ASCVD risk reduction; -Recommended to continue current medication; repeat lipid panel at annual visit  Diabetes (A1c goal <8%) -Uncontrolled - A1c 3.1% (12/1759) on Trulicity 1.5 mg; dose was increased to 3 mg last month; pt was also having GI issues (constipation/diarrhea, low appetite, wt loss) around that time and was advised to hold Trulicity for a month - pt reports she did hold it for 1 week and then resumed 3 mg dose and seems to be tolerating it reasonably well, she has not noticed worsening symptoms with dose increase -Pt called back to report vomiting episodes: she takes Trulicity on Mondays, she reports last week she vomited several times on Wednesday, and today (Tuesday) she vomited as well after eating corn-beef hash and potatoes for lunch -Previously metformin was reduced to 1000 mg/day due to diarrhea, which improved with lower dose -Current home  glucose readings fasting glucose: 124 post prandial glucose: 255 -Current medications: Jardiance 25 mg daily - Appropriate, Query Effective Metformin ER 500 mg -2 tablet daily -Appropriate, Query Effective Trulicity 3 mg weekly -Appropriate, Query Effective Testing supplies -Medications previously tried: Januvia, Rybelsus, glipizide (10 years of tx), Trulicity -Educated on Y0V and blood sugar goals; Complications of diabetes including kidney damage, retinal damage, and cardiovascular disease; -Reviewed Trulicity mechanism/side effects - decreased appetite and weight loss are expected effects of the drug; reviewed diet changes that can limit nausea/bloating;  -Reviewed diet changes to improve BG: advised to pair sugar/carbs with protein to limit BG spikes -Pt has spoken with PCP and plan to stop Trulicity, start Tresiba 10 units daily. And start CGM.  Osteopenia (Goal prevent fractures) - not addressed 02/25/22 -Controlled -Last DEXA Scan: 12/25/20   T-Score femoral neck: -1.5  T-Score total hip: -0.8  T-Score lumbar spine: -0.6  10-year probability of major osteoporotic fracture: 16%  10-year probability of hip fracture: 2.6% -Patient is not a candidate for pharmacologic treatment -Current treatment  None -Recommend [phone] units  of vitamin D daily. Recommend 1200 mg of calcium daily from dietary and supplemental sources. Recommend weight-bearing and muscle strengthening exercises for building and maintaining bone density.  Health Maintenance -Vaccine gaps: Flu, covid booster, Shingrix, TD booster  Patient Goals/Self-Care Activities Patient will:  - take medications as prescribed as evidenced by patient report and record review focus on medication adherence by routine check blood pressure weekly, document, and provide at future appointments check blood sugar daily, document, and provide at future appointments        Medication Assistance: None required.  Patient affirms  current coverage meets needs.  Compliance/Adherence/Medication fill history: Care Gaps: Cologuard (due 04/26/21) AWV due  Star-Rating Drugs:   Medication Access: Within the past 30 days, how often has patient missed a dose of medication? 0 Is a pillbox or other method used to improve adherence? No  Factors that may affect medication adherence? adverse effects of medications Are meds synced by current pharmacy? No  Are meds delivered by current pharmacy? No  Does patient experience delays in picking up medications due to transportation concerns? No   [facility] Services Reviewed: Is patient disadvantaged to use [facility]?: No  Current Rx insurance plan: [employer] [place] Name and location of Current pharmacy:  [address], [place] [address] Phone: [phone] Fax: [phone]  [facility] services reviewed with patient today?: No  Patient requests to transfer care to [facility]?: No  Reason patient declined to change pharmacies: Loyalty to other pharmacy/Patient preference   Care Plan and Follow Up Patient Decision:  Patient agrees to Care Plan and Follow-up.  Plan: Telephone follow up appointment with care management team member scheduled for:  1 month  Charlene Brooke, PharmD, [facility] Clinical Pharmacist [facility] at [facility] [phone]

## 2022-02-25 NOTE — Telephone Encounter (Signed)
Spoke with patient, see CCM encounter from today.

## 2022-02-25 NOTE — Telephone Encounter (Signed)
Patient called and is requesting call back about her medication. She didn't give me a lot of info but just said it had to do with appt from this morning. Call back is [phone]

## 2022-02-26 ENCOUNTER — Ambulatory Visit (INDEPENDENT_AMBULATORY_CARE_PROVIDER_SITE_OTHER): Payer: Medicare PPO

## 2022-02-26 DIAGNOSIS — E538 Deficiency of other specified B group vitamins: Secondary | ICD-10-CM

## 2022-02-26 MED ORDER — CYANOCOBALAMIN 1000 MCG/ML IJ SOLN
1000.0000 ug | Freq: Once | INTRAMUSCULAR | Status: AC
  Administered 2022-02-26: 1000 ug via INTRAMUSCULAR

## 2022-02-26 NOTE — Progress Notes (Signed)
Patient presented for B 12 injection given by Hadlei Stitt, CMA to left deltoid, patient voiced no concerns nor showed any signs of distress during injection.  

## 2022-03-03 DIAGNOSIS — E1165 Type 2 diabetes mellitus with hyperglycemia: Secondary | ICD-10-CM

## 2022-03-03 DIAGNOSIS — E1169 Type 2 diabetes mellitus with other specified complication: Secondary | ICD-10-CM

## 2022-03-03 DIAGNOSIS — E785 Hyperlipidemia, unspecified: Secondary | ICD-10-CM | POA: Diagnosis not present

## 2022-03-03 DIAGNOSIS — I10 Essential (primary) hypertension: Secondary | ICD-10-CM

## 2022-03-03 MED ORDER — INSULIN DEGLUDEC 100 UNIT/ML ~~LOC~~ SOLN: 10.0000 [IU] | Freq: Every day | SUBCUTANEOUS | 1 refills | Status: DC

## 2022-03-03 MED ORDER — "BD DISP NEEDLES 30G X 1/2"" MISC": 0 refills | Status: DC

## 2022-03-03 NOTE — Telephone Encounter (Signed)
Pharamcy called in stating they received the request for the medication but they do not have it in stock and wondered if okay to dispense the pens.

## 2022-03-03 NOTE — Addendum Note (Signed)
Addended by: [name] R on: 03/03/2022 01:55 PM   Modules accepted: Orders

## 2022-03-03 NOTE — Patient Instructions (Signed)
Visit Information  Phone number for Pharmacist: [phone]   Goals Addressed   None     Care Plan : [place]  Updates made by Charlton Haws, RPH since 03/03/2022 12:00 AM     Problem: Hypertension, Hyperlipidemia, Diabetes, GERD, Anxiety, and Osteopenia      Long-Range Goal: Disease mgmt   Start Date: 07/19/2021  Expected End Date: 07/19/2022  This Visit's Progress: On track  Recent Progress: On track  Priority: High  Note:   Current Barriers:  Unable to independently monitor therapeutic efficacy Unable to achieve control of Diabetes  Does not adhere to prescribed medication regimen  Pharmacist Clinical Goal(s):  Patient will achieve adherence to monitoring guidelines and medication adherence to achieve therapeutic efficacy achieve improvement in diabetes as evidenced by A1c < 8% adhere to prescribed medication regimen as evidenced by pt report through collaboration with PharmD and provider.   Interventions: 1:1 collaboration with Lesleigh Noe, MD regarding development and update of comprehensive plan of care as evidenced by provider attestation and co-signature Inter-disciplinary care team collaboration (see longitudinal plan of care) Comprehensive medication review performed; medication list updated in electronic medical record  Hypertension (BP goal <130/80) -Controlled - BP fluctuates at home; pt is off medication now; Pt reports drinking plenty of water lately ("more water than I've ever drank in my life") -Home BP: 95/59 (standing), 142/73 (sitting) -Current treatment: None -Medications previously tried: lisinopril (low BP) -Educated on BP goals and benefits of medications for prevention of heart attack, stroke and kidney damage; Importance of home blood pressure monitoring; -Counseled to monitor BP at home weekly -Recommended to continue current medication  Hyperlipidemia: (LDL goal < 100) -Query controlled - LDL 96 (07/2020), overdue  for repeat lipid panel; pt endorses compliance with statin -Current treatment: Pravastatin 20 mg daily - Appropriate, Effective, Safe, Accessible -Educated on Cholesterol goals; Benefits of statin for ASCVD risk reduction; -Recommended to continue current medication; repeat lipid panel at annual visit  Diabetes (A1c goal <8%) -Uncontrolled - A1c 5.0% (12/3974) on Trulicity 1.5 mg; dose was increased to 3 mg last month; pt was also having GI issues (constipation/diarrhea, low appetite, wt loss) around that time and was advised to hold Trulicity for a month - pt reports she did hold it for 1 week and then resumed 3 mg dose and seems to be tolerating it reasonably well, she has not noticed worsening symptoms with dose increase -Pt called back to report vomiting episodes: she takes Trulicity on Mondays, she reports last week she vomited several times on Wednesday, and today (Tuesday) she vomited as well after eating corn-beef hash and potatoes for lunch -Previously metformin was reduced to 1000 mg/day due to diarrhea, which improved with lower dose -Current home glucose readings fasting glucose: 124 post prandial glucose: 255 -Current medications: Jardiance 25 mg daily - Appropriate, Query Effective Metformin ER 500 mg -2 tablet daily -Appropriate, Query Effective Trulicity 3 mg weekly -Appropriate, Query Effective Testing supplies -Medications previously tried: Januvia, Rybelsus, glipizide (10 years of tx), Trulicity -Educated on B3A and blood sugar goals; Complications of diabetes including kidney damage, retinal damage, and cardiovascular disease; -Reviewed Trulicity mechanism/side effects - decreased appetite and weight loss are expected effects of the drug; reviewed diet changes that can limit nausea/bloating;  -Reviewed diet changes to improve BG: advised to pair sugar/carbs with protein to limit BG spikes -Pt has spoken with PCP and plan to stop Trulicity, start Tresiba 10 units daily. And  start CGM.  Osteopenia (Goal  prevent fractures) - not addressed 02/25/22 -Controlled -Last DEXA Scan: 12/25/20   T-Score femoral neck: -1.5  T-Score total hip: -0.8  T-Score lumbar spine: -0.6  10-year probability of major osteoporotic fracture: 16%  10-year probability of hip fracture: 2.6% -Patient is not a candidate for pharmacologic treatment -Current treatment  None -Recommend [phone] units of vitamin D daily. Recommend 1200 mg of calcium daily from dietary and supplemental sources. Recommend weight-bearing and muscle strengthening exercises for building and maintaining bone density.  Health Maintenance -Vaccine gaps: Flu, covid booster, Shingrix, TD booster  Patient Goals/Self-Care Activities Patient will:  - take medications as prescribed as evidenced by patient report and record review focus on medication adherence by routine check blood pressure weekly, document, and provide at future appointments check blood sugar daily, document, and provide at future appointments      Patient verbalizes understanding of instructions and care plan provided today and agrees to view in [place]. Active MyChart status and patient understanding of how to access instructions and care plan via MyChart confirmed with patient.    Telephone follow up appointment with pharmacy team member scheduled for: 2 weeks  Charlene Brooke, PharmD, [facility] Clinical Pharmacist [facility] [phone]

## 2022-03-04 NOTE — Telephone Encounter (Signed)
[facility] will fill [employer].

## 2022-03-07 ENCOUNTER — Telehealth: Payer: Self-pay | Admitting: Pharmacist

## 2022-03-07 NOTE — Telephone Encounter (Signed)
Spoke with patient about CGM options [employer]. Pt thinks she would have trouble remembering to scan with Wanda [name] so opted for Dexcom device.  Ordered Dexcom G7 to [facility] via [employer]. Pt should hear form them within a few days to authorize delivery. Pt will contact PharmD when she receives system to set up appt for training.

## 2022-03-11 ENCOUNTER — Telehealth: Payer: Self-pay

## 2022-03-11 DIAGNOSIS — Z794 Long term (current) use of insulin: Secondary | ICD-10-CM | POA: Diagnosis not present

## 2022-03-11 DIAGNOSIS — N393 Stress incontinence (female) (male): Secondary | ICD-10-CM | POA: Diagnosis not present

## 2022-03-11 DIAGNOSIS — K219 Gastro-esophageal reflux disease without esophagitis: Secondary | ICD-10-CM | POA: Diagnosis not present

## 2022-03-11 DIAGNOSIS — E119 Type 2 diabetes mellitus without complications: Secondary | ICD-10-CM | POA: Diagnosis not present

## 2022-03-11 DIAGNOSIS — Z811 Family history of alcohol abuse and dependence: Secondary | ICD-10-CM | POA: Diagnosis not present

## 2022-03-11 DIAGNOSIS — F411 Generalized anxiety disorder: Secondary | ICD-10-CM | POA: Diagnosis not present

## 2022-03-11 DIAGNOSIS — E785 Hyperlipidemia, unspecified: Secondary | ICD-10-CM | POA: Diagnosis not present

## 2022-03-11 DIAGNOSIS — Z7984 Long term (current) use of oral hypoglycemic drugs: Secondary | ICD-10-CM | POA: Diagnosis not present

## 2022-03-11 DIAGNOSIS — Z803 Family history of malignant neoplasm of breast: Secondary | ICD-10-CM | POA: Diagnosis not present

## 2022-03-11 NOTE — Progress Notes (Signed)
    Chronic Care Management Pharmacy Assistant   Name: Wanda [name]  MRN: [id number] DOB: 1945-10-09  Reason for Encounter: CCM (Appointment Reminder)  Medications: Outpatient Encounter Medications as of 03/11/2022  Medication Sig   Accu-Chek Softclix Lancets lancets 2 (two) times daily.   acetaminophen (TYLENOL) 325 MG tablet Take 2 tablets by mouth as needed.   Blood Glucose Monitoring Suppl (ACCU-CHEK GUIDE ME) w/Device KIT USE AS DIRECTED TO CHECK BLOOD SUGAR TWICE DAILY   busPIRone (BUSPAR) 15 MG tablet Take 0.5 tablets (7.5 mg total) by mouth 2 (two) times daily. (Patient taking differently: Take 7.5 mg by mouth daily at 12 noon. PRN)   empagliflozin (JARDIANCE) 25 MG TABS tablet Take 1 tablet (25 mg total) by mouth daily before breakfast.   glucose blood (ACCU-CHEK AVIVA PLUS) test strip Use as instructed to test blood sugar 2 times daily E11.45   glucose blood test strip 1 each by Other route 2 (two) times daily. Use as instructed to check blood sugar 2 times daily with OneTouch meter.   Insulin Degludec (TRESIBA) 100 UNIT/ML SOLN Inject 10 Units into the skin daily with breakfast.   metFORMIN (GLUCOPHAGE-XR) 500 MG 24 hr tablet Take 2 tablets (1,000 mg total) by mouth daily with breakfast. (2 tablets)   NEEDLE, DISP, 30 G (BD DISP NEEDLES) 30G X 1/2" MISC Use as directed with insulin   omeprazole (PRILOSEC) 20 MG capsule Take 1 capsule (20 mg total) by mouth every other day.   pravastatin (PRAVACHOL) 20 MG tablet TAKE 1 TABLET BY MOUTH DAILY WITH EVENING MEAL   No facility-administered encounter medications on file as of 03/11/2022.   Wanda [name] was contacted to remind of upcoming telephone visit with Charlene Brooke on 03/14/22 at 1:45. Patient was reminded to have any blood glucose and blood pressure readings available for review at appointment.   Patient confirmed appointment.  Are you having any problems with your medications? No   Do you have any concerns you like to  discuss with the pharmacist? No  CCM referral has been placed prior to visit?  Yes   Star Rating Drugs: Medication:  Last Fill: [facility] Metformin 500 mg 02/28/22 30 Pravastatin 20 mg 12/23/21 90 Jardiance 25 mg 01/03/22 [place], CPP notified  Marijean Niemann, [place] Pharmacy Assistant [phone]

## 2022-03-13 DIAGNOSIS — E1165 Type 2 diabetes mellitus with hyperglycemia: Secondary | ICD-10-CM | POA: Diagnosis not present

## 2022-03-13 DIAGNOSIS — Z794 Long term (current) use of insulin: Secondary | ICD-10-CM | POA: Diagnosis not present

## 2022-03-14 ENCOUNTER — Telehealth: Payer: Medicare PPO

## 2022-03-14 NOTE — Progress Notes (Deleted)
Chronic Care Management Pharmacy Note  03/14/2022 Name:  Wanda [name] MRN:  [id number] DOB:  09/13/1945  Summary: CCM F/U visit -DM: A1c 8.5% (01/2022); pt has been having side effects with Trulicity (vomiting, weight loss, constipation) and after discussion with PCP has decided to stop Trulicity and start insulin. She also wants to get a CGM  Recommendations/Changes made from today's visit: -Agree with PCP plan: stop Trulicity and start Tresiba 10 units daily -Order CGM through DME  Plan: -Pharmacist follow up televisit scheduled for 2 weeks -PCP F/U 04/29/22     Subjective: Wanda [name] is an 76 y.o. year old female who is a primary patient of Cody, Jobe Marker, MD.  The CCM team was consulted for assistance with disease management and care coordination needs.    Engaged with patient by telephone for follow up visit in response to provider referral for pharmacy case management and/or care coordination services.   Consent to Services:  The patient was given information about Chronic Care Management services, agreed to services, and gave verbal consent prior to initiation of services.  Please see initial visit note for detailed documentation.   Patient Care Team: Lesleigh Noe, MD as PCP - General (Family Medicine) Katy Apo, MD as Consulting Physician (Ophthalmology) [facility], Melanie Crazier, MD as Consulting Physician (Endocrinology) Willia Craze, NP as Nurse Practitioner (Gastroenterology) Charlton Haws, [facility] as Pharmacist (Pharmacist)  Recent office visits: 01/24/22 Dr Einar Pheasant OV: f/u DM - A1c 8.2%; increase Trulicity to 3 mg. F/u 3 months. Encouraged Buspar BID as prescribed. Referred for therapy. Pt lost 8-10 lbs. Declined nutrition referral.  05/23/2021 - [name], MD - Patient presented for follow up for diabetes. Continue Buspar 7.5 mg BID due to significant improvement. Change: Decrease Lisinopril 10 > $Re'5mg'Mmd$  due to orthostatic hypotension.   04/18/2021  - [name], MD - Patient presented for diabetes follow up. Referral to Gastroenterology due to nausea and difficulty swallowing. Change: switch glipizide 5 mg bid to glipizide XL 10 mg daily. Change: Increase jardiance to 25 mg. Change: Increase buspar 3.75 > 7.5 mg and to 15 mg daily if no improvement after 2 weeks for anxiety.   Recent consult visits: 01/23/22 PA Amy Esterwood (GI): weight loss - 6-7 mo hx of diarrhea. Decrease in appetite 3-4 mos. Asked pt to stop Trulicity - 1 month trial off.  07/23/21 Dr Candiss Norse (Nephrology): f/u CKD. Stable. No changes. F/U 1 year.  04/02/2021 - Johny Drilling, RN - Nutrition - Patient presented for diabetes. Recommended: Check blood sugars 1 x day before breakfast or 2 hrs after supper every day.   02/25/2021 - Casimiro Needle, RN - Urology - Telephone - Start: cephalexin [facility]) 500 MG capsule for 5 days.   02/20/2021 - [place] Pink - Urology - Patient presented for urinary frequency and prolapse of vaginal wall. No other information provided.   02/19/2021 - Johny Drilling, RN - Nutrition - Patient presented for diabetes. Recommended: Eat 3 meals day, 1-2  snacks a day, space meals 4-6 hours apart, don't skip meals and limit fried foods, desserts and sweets. Complete 3 Day Food Record and bring to next appt.   Hospital visits: None in previous 6 months   Objective:  Lab Results  Component Value Date   CREATININE 1.10 01/02/2022   BUN 20 01/02/2022   GFR 49.62 (L) 01/02/2022   GFRNONAA 57 (L) 06/23/2014   GFRAA 67 (L) 06/23/2014   NA 142 01/02/2022   K 5.2 No hemolysis seen (H) 01/02/2022  CALCIUM 10.0 01/02/2022   CO2 27 01/02/2022   GLUCOSE 172 (H) 01/02/2022    Lab Results  Component Value Date/Time   HGBA1C 8.5 (A) 01/24/2022 10:26 AM   HGBA1C 9.6 (A) 10/11/2021 10:49 AM   HGBA1C 9.3 (H) 07/06/2020 02:33 PM   HGBA1C 8.0 (H) 08/23/2019 02:36 PM   HGBA1C 7.7 08/25/2018 10:28 AM   GFR 49.62 (L) 01/02/2022 10:04 AM   GFR  61.34 07/06/2020 02:33 PM   MICROALBUR 1.3 10/02/2014 03:01 PM   MICROALBUR 1.9 08/05/2013 09:20 AM    Last diabetic Eye exam:  Lab Results  Component Value Date/Time   HMDIABEYEEXA No Retinopathy 11/01/2021 12:00 AM    Last diabetic Foot exam: No results found for: "HMDIABFOOTEX"   Lab Results  Component Value Date   CHOL 181 07/06/2020   HDL 39.30 07/06/2020   LDLCALC 84 05/05/2017   LDLDIRECT 96.0 07/06/2020   TRIG 279.0 (H) 07/06/2020   CHOLHDL 5 07/06/2020       Latest Ref Rng & Units 01/02/2022   10:04 AM 07/16/2021   12:00 AM 07/06/2020    2:33 PM  Hepatic Function  Total Protein 6.0 - 8.3 g/dL 7.3   7.4   Albumin 3.5 - 5.2 g/dL 4.2  4.3     4.2   AST 0 - 37 U/L 18   32   ALT 0 - 35 U/L 17   34   Alk Phosphatase 39 - 117 U/L 90   90   Total Bilirubin 0.2 - 1.2 mg/dL 0.5   0.4      This result is from an external source.    Lab Results  Component Value Date/Time   TSH 3.43 01/02/2022 10:04 AM   TSH 3.57 08/23/2019 02:36 PM       Latest Ref Rng & Units 01/02/2022   10:04 AM 07/16/2021   12:00 AM 07/06/2020    2:33 PM  CBC  WBC 4.0 - 10.5 K/uL 6.8  7.2     7.8   Hemoglobin 12.0 - 15.0 g/dL 13.0  13.2     12.8   Hematocrit 36.0 - 46.0 % 40.4  40     38.8   Platelets 150.0 - 400.0 K/uL 274.0  283     291.0      This result is from an external source.    Lab Results  Component Value Date/Time   VD25OH 21.70 (L) 08/23/2019 02:36 PM    Clinical ASCVD: No  The 10-year ASCVD risk score (Arnett DK, et al., 2019) is: 21.9%   Values used to calculate the score:     Age: 76 years     Sex: Female     Is Non-Hispanic African American: No     Diabetic: Yes     Tobacco smoker: No     Systolic Blood Pressure: 376 mmHg     Is BP treated: Yes     HDL Cholesterol: 39.3 mg/dL     Total Cholesterol: 181 mg/dL       12/17/2021    2:51 PM 05/23/2021   12:52 PM 02/19/2021    2:04 PM  Depression screen PHQ 2/9  Decreased Interest 0 1 0  Down, Depressed, Hopeless 0  0 0  PHQ - 2 Score 0 1 0  Altered sleeping  1   Tired, decreased energy  1   Change in appetite  1   Feeling bad or failure about yourself   0   Trouble concentrating  0  Moving slowly or fidgety/restless  0   Suicidal thoughts  0   PHQ-9 Score  4   Difficult doing work/chores  Not difficult at all      Social History   Tobacco Use  Smoking Status Never  Smokeless Tobacco Never   BP Readings from Last 3 Encounters:  01/24/22 100/70  01/23/22 124/68  01/02/22 108/60   Pulse Readings from Last 3 Encounters:  01/24/22 83  01/23/22 60  01/02/22 86   Wt Readings from Last 3 Encounters:  01/24/22 128 lb 5 oz (58.2 kg)  01/23/22 130 lb 9.6 oz (59.2 kg)  01/02/22 129 lb 6 oz (58.7 kg)   BMI Readings from Last 3 Encounters:  01/24/22 22.02 kg/m  01/23/22 22.42 kg/m  01/02/22 22.21 kg/m    Assessment/Interventions: Review of patient past medical history, allergies, medications, health status, including review of consultants reports, laboratory and other test data, was performed as part of comprehensive evaluation and provision of chronic care management services.   SDOH:  (Social Determinants of Health) assessments and interventions performed: Yes   SDOH Screenings   Alcohol Screen: Low Risk  (12/17/2021)   Alcohol Screen    Last Alcohol Screening Score (AUDIT): 0  Depression (PHQ2-9): Low Risk  (12/17/2021)   Depression (PHQ2-9)    PHQ-2 Score: 0  Financial Resource Strain: Low Risk  (12/17/2021)   Overall Financial Resource Strain (CARDIA)    Difficulty of Paying Living Expenses: Not hard at all  Food Insecurity: No Food Insecurity (12/17/2021)   Hunger Vital Sign    Worried About Running Out of Food in the Last Year: Never true    Ran Out of Food in the Last Year: Never true  Housing: Low Risk  (12/17/2021)   Housing    Last Housing Risk Score: 0  Physical Activity: Unknown (12/17/2021)   Exercise Vital Sign    Days of Exercise per Week: Patient refused     Minutes of Exercise per Session: Patient refused  Social Connections: Moderately Integrated (12/17/2021)   Social Connection and Isolation Panel [NHANES]    Frequency of Communication with Friends and Family: More than three times a week    Frequency of Social Gatherings with Friends and Family: More than three times a week    Attends Religious Services: More than 4 times per year    Active Member of [employer] or Organizations: Yes    Attends Archivist Meetings: More than 4 times per year    Marital Status: Widowed  Stress: No Stress Concern Present (12/17/2021)   [place]    Feeling of Stress : Not at all  Tobacco Use: Low Risk  (01/23/2022)   Patient History    Smoking Tobacco Use: Never    Smokeless Tobacco Use: Never    Passive Exposure: Not on file  Transportation Needs: No Transportation Needs (12/17/2021)   PRAPARE - Transportation    Lack of Transportation (Medical): No    Lack of Transportation (Non-Medical): No    CCM Care Plan  Allergies  Allergen Reactions   Niacin     REACTION: unbearable hot flashes   Sulfamethoxazole-Trimethoprim Itching    Medications Reviewed Today     Reviewed by Loreen Freud, CMA (Certified Medical Assistant) on 01/24/22 at 50  Med List Status: <None>   Medication Order Taking? Sig Documenting Provider Last Dose Status Informant  Accu-Chek Softclix Lancets lancets [id number] Yes 2 (two) times daily. [provider] Taking  Active   acetaminophen (TYLENOL) 325 MG tablet [id number] Yes Take 2 tablets by mouth as needed. [provider] Taking Active   Blood Glucose Monitoring Suppl (ACCU-CHEK GUIDE ME) w/Device KIT [id number] Yes USE AS DIRECTED TO CHECK BLOOD SUGAR TWICE DAILY [provider] Taking Active   busPIRone (BUSPAR) 15 MG tablet [id number] Yes Take 0.5 tablets (7.5 mg total) by mouth 2 (two) times daily.  Patient taking differently:  Take 7.5 mg by mouth daily at 12 noon. PRN   Lesleigh Noe, MD Taking Active   Dulaglutide (TRULICITY) 1.5 QP/5.9FM Bonney Aid [id number] Yes Inject 1.5 mg into the skin once a week. Lesleigh Noe, MD Taking Active   empagliflozin (JARDIANCE) 25 MG TABS tablet [id number] Yes Take 1 tablet (25 mg total) by mouth daily before breakfast. Lesleigh Noe, MD Taking Active   glucose blood (ACCU-CHEK AVIVA PLUS) test strip [id number] Yes Use as instructed to test blood sugar 2 times daily E11.45 Shamleffer, Melanie Crazier, MD Taking Active   glucose blood test strip [id number] Yes 1 each by Other route 2 (two) times daily. Use as instructed to check blood sugar 2 times daily with OneTouch meter. Lesleigh Noe, MD Taking Active   metFORMIN (GLUCOPHAGE-XR) 500 MG 24 hr tablet [id number] Yes Take 2 tablets (1,000 mg total) by mouth daily with breakfast. (2 tablets) Lesleigh Noe, MD Taking Active   omeprazole (PRILOSEC) 20 MG capsule [id number] Yes Take 1 capsule (20 mg total) by mouth every other day. Lesleigh Noe, MD Taking Active   pravastatin (PRAVACHOL) 20 MG tablet [id number] Yes TAKE 1 TABLET BY MOUTH DAILY WITH EVENING MEAL Lesleigh Noe, MD Taking Active             Patient Active Problem List   Diagnosis Date Noted   B12 deficiency 01/24/2022   Weight loss 01/24/2022   Other fatigue 01/02/2022   Postural dizziness 05/23/2021   Generalized anxiety disorder 04/18/2021   Muscle spasm 04/18/2021   Diarrhea 01/08/2021   Osteopenia 01/01/2021   Thumb pain, left 08/21/2020   Shortness of breath 07/09/2020   Tinnitus of both ears 07/09/2020   Type 2 diabetes mellitus with hyperglycemia, without long-term current use of insulin ([place]) 10/03/2019   Type 2 diabetes mellitus with stage 3a chronic kidney disease, without long-term current use of insulin ([place]) 10/03/2019   Disease related peripheral neuropathy 08/22/2019   Urinary frequency 05/18/2019   Stress incontinence of urine 02/21/2019    Grief 07/15/2018   Pelvic prolapse 01/22/2017   Hyperlipidemia associated with type 2 diabetes mellitus ([place]) 05/03/2014   GERD (gastroesophageal reflux disease) 12/05/2013   CKD (chronic kidney disease) stage 3, GFR 30-59 ml/min ([place]) 11/27/2011   HYPERKALEMIA 11/30/2009   INSOMNIA 11/21/2009   FATIGUE 11/21/2009   Diabetes mellitus with renal manifestation ([place]) 07/25/2009   Essential hypertension 07/25/2009   COLONIC POLYPS, HX OF 07/25/2009    Immunization History  Administered Date(s) Administered   Influenza Split 06/03/2012   Influenza,inj,Quad PF,6+ Mos 08/10/2013, 05/03/2014, 05/06/2017, 07/15/2018, 05/18/2019   PFIZER(Purple Top)SARS-COV-2 Vaccination 09/18/2019, 10/11/2019   PPD Test 04/19/2013   Pneumococcal Conjugate-13 10/02/2014   Pneumococcal Polysaccharide-23 04/25/2013   Td 08/19/2007    Conditions to be addressed/monitored:  Hypertension, Hyperlipidemia, Diabetes, GERD, Anxiety, and Osteopenia  There are no care plans that you recently modified to display for this patient.       Medication Assistance: None required.  Patient affirms current coverage meets needs.  Compliance/Adherence/Medication fill history: Care Gaps: Cologuard (  due 04/26/21) AWV due  Star-Rating Drugs:   Medication Access: Within the past 30 days, how often has patient missed a dose of medication? 0 Is a pillbox or other method used to improve adherence? No  Factors that may affect medication adherence? adverse effects of medications Are meds synced by current pharmacy? No  Are meds delivered by current pharmacy? No  Does patient experience delays in picking up medications due to transportation concerns? No   [facility] Services Reviewed: Is patient disadvantaged to use [facility]?: No  Current Rx insurance plan: [employer] [place] Name and location of Current pharmacy:  [place], [place]  [address] Phone: [phone] Fax: [phone]  [facility] services reviewed with patient today?: No  Patient requests to transfer care to [facility] Pharmacy?: No  Reason patient declined to change pharmacies: Loyalty to other pharmacy/Patient preference   Care Plan and Follow Up Patient Decision:  Patient agrees to Care Plan and Follow-up.  Plan: Telephone follow up appointment with care management team member scheduled for:  1 month  Charlene Brooke, PharmD, BCACP Clinical Pharmacist [facility] at [facility] [phone]   Current Barriers:  Unable to independently monitor therapeutic efficacy Unable to achieve control of Diabetes  Does not adhere to prescribed medication regimen  Pharmacist Clinical Goal(s):  Patient will achieve adherence to monitoring guidelines and medication adherence to achieve therapeutic efficacy achieve improvement in diabetes as evidenced by A1c < 8% adhere to prescribed medication regimen as evidenced by pt report through collaboration with PharmD and provider.   Interventions: 1:1 collaboration with Lesleigh Noe, MD regarding development and update of comprehensive plan of care as evidenced by provider attestation and co-signature Inter-disciplinary care team collaboration (see longitudinal plan of care) Comprehensive medication review performed; medication list updated in electronic medical record  Hypertension (BP goal <130/80) -Controlled - BP fluctuates at home; pt is off medication now; Pt reports drinking plenty of water lately ("more water than I've ever drank in my life") -Home BP: 95/59 (standing), 142/73 (sitting) -Current treatment: None -Medications previously tried: lisinopril (low BP) -Educated on BP goals and benefits of medications for prevention of heart attack, stroke and kidney damage; Importance of home blood pressure monitoring; -Counseled to monitor BP at home weekly -Recommended to continue current  medication  Hyperlipidemia: (LDL goal < 100) -Query controlled - LDL 96 (07/2020), overdue for repeat lipid panel; pt endorses compliance with statin -Current treatment: Pravastatin 20 mg daily - Appropriate, Effective, Safe, Accessible -Educated on Cholesterol goals; Benefits of statin for ASCVD risk reduction; -Recommended to continue current medication; repeat lipid panel at annual visit  Diabetes (A1c goal <8%) -Uncontrolled - A1c 9.6% (09/9526) on Trulicity 1.5 mg; dose was increased to 3 mg last month; pt was also having GI issues (constipation/diarrhea, low appetite, wt loss) around that time and was advised to hold Trulicity for a month - pt reports she did hold it for 1 week and then resumed 3 mg dose and seems to be tolerating it reasonably well, she has not noticed worsening symptoms with dose increase -Pt called back to report vomiting episodes: she takes Trulicity on Mondays, she reports last week she vomited several times on Wednesday, and today (Tuesday) she vomited as well after eating corn-beef hash and potatoes for lunch -Previously metformin was reduced to 1000 mg/day due to diarrhea, which improved with lower dose -Current home glucose readings fasting glucose: 124 post prandial glucose:  255 -Current medications: Jardiance 25 mg daily - Appropriate, Query Effective Metformin ER 500 mg -2 tablet daily -Appropriate, Query Effective Tresiba 10 units daily -Appropriate, Query Effective Testing supplies -Medications previously tried: Januvia, Rybelsus, glipizide (10 years of tx), Trulicity -Educated on J4G and blood sugar goals; Complications of diabetes including kidney damage, retinal damage, and cardiovascular disease; -Reviewed Trulicity mechanism/side effects - decreased appetite and weight loss are expected effects of the drug; reviewed diet changes that can limit nausea/bloating;  -Reviewed diet changes to improve BG: advised to pair sugar/carbs with protein to limit BG  spikes -Pt has spoken with PCP and plan to stop Trulicity, start Tresiba 10 units daily. And start CGM.  Osteopenia (Goal prevent fractures) - not addressed 02/25/22 -Controlled -Last DEXA Scan: 12/25/20   T-Score femoral neck: -1.5  T-Score total hip: -0.8  T-Score lumbar spine: -0.6  10-year probability of major osteoporotic fracture: 16%  10-year probability of hip fracture: 2.6% -Patient is not a candidate for pharmacologic treatment -Current treatment  None -Recommend [phone] units of vitamin D daily. Recommend 1200 mg of calcium daily from dietary and supplemental sources. Recommend weight-bearing and muscle strengthening exercises for building and maintaining bone density.  Health Maintenance -Vaccine gaps: Flu, covid booster, Shingrix, TD booster  Patient Goals/Self-Care Activities Patient will:  - take medications as prescribed as evidenced by patient report and record review focus on medication adherence by routine check blood pressure weekly, document, and provide at future appointments check blood sugar daily, document, and provide at future appointments

## 2022-03-14 NOTE — Telephone Encounter (Signed)
Spoke with patient, she is doing well with insulin so far. She will receive Dexcom shipment today. Scheduled in-person visit for CGM training and to go over insulin technique.  Appt scheduled 8/14 @ 2:15.

## 2022-03-17 ENCOUNTER — Ambulatory Visit (INDEPENDENT_AMBULATORY_CARE_PROVIDER_SITE_OTHER): Payer: Medicare PPO | Admitting: Pharmacist

## 2022-03-17 DIAGNOSIS — I10 Essential (primary) hypertension: Secondary | ICD-10-CM

## 2022-03-17 DIAGNOSIS — E1165 Type 2 diabetes mellitus with hyperglycemia: Secondary | ICD-10-CM

## 2022-03-17 DIAGNOSIS — E1169 Type 2 diabetes mellitus with other specified complication: Secondary | ICD-10-CM

## 2022-03-17 NOTE — Patient Instructions (Signed)
Visit Information  Phone number for Pharmacist: [phone]   Goals Addressed   None     Care Plan : [place]  Updates made by Charlton Haws, RPH since 03/17/2022 12:00 AM     Problem: Hypertension, Hyperlipidemia, Diabetes, GERD, Anxiety, and Osteopenia      Long-Range Goal: Disease mgmt   Start Date: 07/19/2021  Expected End Date: 07/19/2022  This Visit's Progress: On track  Recent Progress: On track  Priority: High  Note:   Current Barriers:  Unable to independently monitor therapeutic efficacy Unable to achieve control of Diabetes  Struggles to adhere to prescribed medication regimen  Pharmacist Clinical Goal(s):  Patient will achieve adherence to monitoring guidelines and medication adherence to achieve therapeutic efficacy achieve improvement in diabetes as evidenced by A1c < 8% adhere to prescribed medication regimen as evidenced by pt report through collaboration with PharmD and provider.   Interventions: 1:1 collaboration with Lesleigh Noe, MD regarding development and update of comprehensive plan of care as evidenced by provider attestation and co-signature Inter-disciplinary care team collaboration (see longitudinal plan of care) Comprehensive medication review performed; medication list updated in electronic medical record  Hypertension (BP goal <130/80) -Controlled - BP fluctuates at home; pt is off medication now; Pt reports drinking plenty of water lately ("more water than I've ever drank in my life") -Home BP: 95/59 (standing), 142/73 (sitting) -Current treatment: None -Medications previously tried: lisinopril (low BP) -Educated on BP goals and benefits of medications for prevention of heart attack, stroke and kidney damage; Importance of home blood pressure monitoring; -Counseled to monitor BP at home weekly -Recommended to continue current medication  Hyperlipidemia: (LDL goal < 100) -Query controlled - LDL 96 (07/2020),  overdue for repeat lipid panel; pt endorses compliance with statin -Current treatment: Pravastatin 20 mg daily - Appropriate, Effective, Safe, Accessible -Educated on Cholesterol goals; Benefits of statin for ASCVD risk reduction; -Recommended to continue current medication; repeat lipid panel at annual visit  Diabetes (A1c goal <8%) -Uncontrolled - A1c 6.1% (04/5092) on Trulicity 1.5 mg; she has since been switched to [place] due to side effects with Trulicity -Previously metformin was reduced to 1000 mg/day due to diarrhea, which improved with lower dose -Dexcom training completed today - see summary -Current home glucose readings fasting glucose: 124 post prandial glucose: 255 -Current medications: Jardiance 25 mg daily - Appropriate, Query Effective Metformin ER 500 mg -2 tablet daily -Appropriate, Query Effective Tresiba 10 units daily -Appropriate, Query Effective Dexcom G7 -Medications previously tried: Januvia, Rybelsus, glipizide (10 years of tx), Trulicity -Educated on O6Z and blood sugar goals; Complications of diabetes including kidney damage, retinal damage, and cardiovascular disease; -Recommend to continue current medication  Osteopenia (Goal prevent fractures) -Controlled -Last DEXA Scan: 12/25/20   T-Score femoral neck: -1.5  T-Score total hip: -0.8  T-Score lumbar spine: -0.6  10-year probability of major osteoporotic fracture: 16%  10-year probability of hip fracture: 2.6% -Patient is not a candidate for pharmacologic treatment -Current treatment  None -Recommend [phone] units of vitamin D daily. Recommend 1200 mg of calcium daily from dietary and supplemental sources. Recommend weight-bearing and muscle strengthening exercises for building and maintaining bone density.  Health Maintenance -Vaccine gaps: Flu, covid booster, Shingrix, TD booster  Patient Goals/Self-Care Activities Patient will:  - take medications as prescribed as evidenced by patient report  and record review focus on medication adherence by routine check blood pressure weekly, document, and provide at future appointments check blood sugar daily, document, and provide  at future appointments      Patient verbalizes understanding of instructions and care plan provided today and agrees to view in [place]. Active MyChart status and patient understanding of how to access instructions and care plan via MyChart confirmed with patient.    Telephone follow up appointment with pharmacy team member scheduled for: 2 weeks  Charlene Brooke, PharmD, [facility] Clinical Pharmacist [facility] at [facility] [phone]

## 2022-03-17 NOTE — Progress Notes (Signed)
Chronic Care Management Pharmacy Note  03/17/2022 Name:  Wanda [name] MRN:  [id number] DOB:  05-24-1946  Summary: CCM F/U visit Patient presented for [facility] G7 training.  Demonstrated with teach-back: -components of sensor and how to prepare sensor for application -how to apply sensor to arm. Patient successfully applied sensor to Left arm today -how to navigate Dexcom app in order to pair sensor; sensor was successfully paired today -how to adjust high/low sugar alarms. Low alarm set to 70 and high alarm set to 250 today. -App users: demonstrated navigating various components including settings, logbook, and daily patterns  Educated patient on: -viewing reader at any time to view real-time sugar levels -difference between blood sugar and sensor (interstitial fluid) sugar levels -changing sensor every 10 days or as initiated by app/reader -removing sensor prior to MRI, CT scans -if sugar readings ever seem wrong/questionable, check sugar with a finger stick -Dexcom Clarity cloud monitoring: Patient did enroll in Clarity  Provided customer service line for Dexcom in case of sensor breakdowns: [phone]   Pt voiced understanding of above and denies further questions.   Plan: -Pharmacist follow up televisit scheduled for 2 weeks -PCP F/U 04/29/22     Subjective: Wanda [name] is an 76 y.o. year old female who is a primary patient of Cody, Jobe Marker, MD.  The CCM team was consulted for assistance with disease management and care coordination needs.    Engaged with patient by telephone for follow up visit in response to provider referral for pharmacy case management and/or care coordination services.   Consent to Services:  The patient was given information about Chronic Care Management services, agreed to services, and gave verbal consent prior to initiation of services.  Please see initial visit note for detailed documentation.   Patient Care Team: Lesleigh Noe, MD as  PCP - General (Family Medicine) Katy Apo, MD as Consulting Physician (Ophthalmology) [facility], Melanie Crazier, MD as Consulting Physician (Endocrinology) Willia Craze, NP as Nurse Practitioner (Gastroenterology) Charlton Haws, [facility] as Pharmacist (Pharmacist)  Recent office visits: 01/24/22 Dr Einar Pheasant OV: f/u DM - A1c 6.2%; increase Trulicity to 3 mg. F/u 3 months. Encouraged Buspar BID as prescribed. Referred for therapy. Pt lost 8-10 lbs. Declined nutrition referral.  05/23/2021 - [name], MD - Patient presented for follow up for diabetes. Continue Buspar 7.5 mg BID due to significant improvement. Change: Decrease Lisinopril 10 > 44m due to orthostatic hypotension.   04/18/2021 - [name] MD - Patient presented for diabetes follow up. Referral to Gastroenterology due to nausea and difficulty swallowing. Change: switch glipizide 5 mg bid to glipizide XL 10 mg daily. Change: Increase jardiance to 25 mg. Change: Increase buspar 3.75 > 7.5 mg and to 15 mg daily if no improvement after 2 weeks for anxiety.   Recent consult visits: 01/23/22 PA Amy Esterwood (GI): weight loss - 6-7 mo hx of diarrhea. Decrease in appetite 3-4 mos. Asked pt to stop Trulicity - 1 month trial off.  07/23/21 Dr SCandiss Norse(Nephrology): f/u CKD. Stable. No changes. F/U 1 year.  04/02/2021 - SJohny Drilling RN - Nutrition - Patient presented for diabetes. Recommended: Check blood sugars 1 x day before breakfast or 2 hrs after supper every day.   02/25/2021 - TCasimiro Needle RN - Urology - Telephone - Start: cephalexin ([facility] 500 MG capsule for 5 days.   02/20/2021 - CMaryland Pink- Urology - Patient presented for urinary frequency and prolapse of vaginal wall. No other information provided.  02/19/2021 - Johny Drilling, RN - Nutrition - Patient presented for diabetes. Recommended: Eat 3 meals day, 1-2  snacks a day, space meals 4-6 hours apart, don't skip meals and limit fried foods, desserts  and sweets. Complete 3 Day Food Record and bring to next appt.   Hospital visits: None in previous 6 months   Objective:  Lab Results  Component Value Date   CREATININE 1.10 01/02/2022   BUN 20 01/02/2022   GFR 49.62 (L) 01/02/2022   GFRNONAA 57 (L) 06/23/2014   GFRAA 67 (L) 06/23/2014   NA 142 01/02/2022   K 5.2 No hemolysis seen (H) 01/02/2022   CALCIUM 10.0 01/02/2022   CO2 27 01/02/2022   GLUCOSE 172 (H) 01/02/2022    Lab Results  Component Value Date/Time   HGBA1C 8.5 (A) 01/24/2022 10:26 AM   HGBA1C 9.6 (A) 10/11/2021 10:49 AM   HGBA1C 9.3 (H) 07/06/2020 02:33 PM   HGBA1C 8.0 (H) 08/23/2019 02:36 PM   HGBA1C 7.7 08/25/2018 10:28 AM   GFR 49.62 (L) 01/02/2022 10:04 AM   GFR 61.34 07/06/2020 02:33 PM   MICROALBUR 1.3 10/02/2014 03:01 PM   MICROALBUR 1.9 08/05/2013 09:20 AM    Last diabetic Eye exam:  Lab Results  Component Value Date/Time   HMDIABEYEEXA No Retinopathy 11/01/2021 12:00 AM    Last diabetic Foot exam: No results found for: "HMDIABFOOTEX"   Lab Results  Component Value Date   CHOL 181 07/06/2020   HDL 39.30 07/06/2020   LDLCALC 84 05/05/2017   LDLDIRECT 96.0 07/06/2020   TRIG 279.0 (H) 07/06/2020   CHOLHDL 5 07/06/2020       Latest Ref Rng & Units 01/02/2022   10:04 AM 07/16/2021   12:00 AM 07/06/2020    2:33 PM  Hepatic Function  Total Protein 6.0 - 8.3 g/dL 7.3   7.4   Albumin 3.5 - 5.2 g/dL 4.2  4.3     4.2   AST 0 - 37 U/L 18   32   ALT 0 - 35 U/L 17   34   Alk Phosphatase 39 - 117 U/L 90   90   Total Bilirubin 0.2 - 1.2 mg/dL 0.5   0.4      This result is from an external source.    Lab Results  Component Value Date/Time   TSH 3.43 01/02/2022 10:04 AM   TSH 3.57 08/23/2019 02:36 PM       Latest Ref Rng & Units 01/02/2022   10:04 AM 07/16/2021   12:00 AM 07/06/2020    2:33 PM  CBC  WBC 4.0 - 10.5 K/uL 6.8  7.2     7.8   Hemoglobin 12.0 - 15.0 g/dL 13.0  13.2     12.8   Hematocrit 36.0 - 46.0 % 40.4  40     38.8    Platelets 150.0 - 400.0 K/uL 274.0  283     291.0      This result is from an external source.    Lab Results  Component Value Date/Time   VD25OH 21.70 (L) 08/23/2019 02:36 PM    Clinical ASCVD: No  The 10-year ASCVD risk score (Arnett DK, et al., 2019) is: 21.9%   Values used to calculate the score:     Age: 58 years     Sex: Female     Is Non-Hispanic African American: No     Diabetic: Yes     Tobacco smoker: No     Systolic Blood Pressure: 979  mmHg     Is BP treated: Yes     HDL Cholesterol: 39.3 mg/dL     Total Cholesterol: 181 mg/dL       12/17/2021    2:51 PM 05/23/2021   12:52 PM 02/19/2021    2:04 PM  Depression screen PHQ 2/9  Decreased Interest 0 1 0  Down, Depressed, Hopeless 0 0 0  PHQ - 2 Score 0 1 0  Altered sleeping  1   Tired, decreased energy  1   Change in appetite  1   Feeling bad or failure about yourself   0   Trouble concentrating  0   Moving slowly or fidgety/restless  0   Suicidal thoughts  0   PHQ-9 Score  4   Difficult doing work/chores  Not difficult at all      Social History   Tobacco Use  Smoking Status Never  Smokeless Tobacco Never   BP Readings from Last 3 Encounters:  01/24/22 100/70  01/23/22 124/68  01/02/22 108/60   Pulse Readings from Last 3 Encounters:  01/24/22 83  01/23/22 60  01/02/22 86   Wt Readings from Last 3 Encounters:  01/24/22 128 lb 5 oz (58.2 kg)  01/23/22 130 lb 9.6 oz (59.2 kg)  01/02/22 129 lb 6 oz (58.7 kg)   BMI Readings from Last 3 Encounters:  01/24/22 22.02 kg/m  01/23/22 22.42 kg/m  01/02/22 22.21 kg/m    Assessment/Interventions: Review of patient past medical history, allergies, medications, health status, including review of consultants reports, laboratory and other test data, was performed as part of comprehensive evaluation and provision of chronic care management services.   SDOH:  (Social Determinants of Health) assessments and interventions performed: Yes   SDOH  Screenings   Alcohol Screen: Low Risk  (12/17/2021)   Alcohol Screen    Last Alcohol Screening Score (AUDIT): 0  Depression (PHQ2-9): Low Risk  (12/17/2021)   Depression (PHQ2-9)    PHQ-2 Score: 0  Financial Resource Strain: Low Risk  (12/17/2021)   Overall Financial Resource Strain (CARDIA)    Difficulty of Paying Living Expenses: Not hard at all  Food Insecurity: No Food Insecurity (12/17/2021)   Hunger Vital Sign    Worried About Running Out of Food in the Last Year: Never true    Ran Out of Food in the Last Year: Never true  Housing: Low Risk  (12/17/2021)   Housing    Last Housing Risk Score: 0  Physical Activity: Unknown (12/17/2021)   Exercise Vital Sign    Days of Exercise per Week: Patient refused    Minutes of Exercise per Session: Patient refused  Social Connections: Moderately Integrated (12/17/2021)   Social Connection and Isolation Panel [NHANES]    Frequency of Communication with Friends and Family: More than three times a week    Frequency of Social Gatherings with Friends and Family: More than three times a week    Attends Religious Services: More than 4 times per year    Active Member of [employer] or Organizations: Yes    Attends Archivist Meetings: More than 4 times per year    Marital Status: Widowed  Stress: No Stress Concern Present (12/17/2021)   [place]    Feeling of Stress : Not at all  Tobacco Use: Low Risk  (01/23/2022)   Patient History    Smoking Tobacco Use: Never    Smokeless Tobacco Use: Never    Passive Exposure:  Not on file  Transportation Needs: No Transportation Needs (12/17/2021)   PRAPARE - Hydrologist (Medical): No    Lack of Transportation (Non-Medical): No    CCM Care Plan  Allergies  Allergen Reactions   Niacin     REACTION: unbearable hot flashes   Sulfamethoxazole-Trimethoprim Itching    Medications Reviewed Today      Reviewed by Charlton Haws, [facility] (Pharmacist) on 03/17/22 at 1453  Med List Status: <None>   Medication Order Taking? Sig Documenting Provider Last Dose Status Informant  Accu-Chek Softclix Lancets lancets [id number] Yes 2 (two) times daily. [provider] Taking Active   acetaminophen (TYLENOL) 325 MG tablet [id number] Yes Take 2 tablets by mouth as needed. [provider] Taking Active   Blood Glucose Monitoring Suppl (ACCU-CHEK GUIDE ME) w/Device KIT [id number] Yes USE AS DIRECTED TO CHECK BLOOD SUGAR TWICE DAILY [provider] Taking Active   busPIRone (BUSPAR) 15 MG tablet [id number] Yes Take 0.5 tablets (7.5 mg total) by mouth 2 (two) times daily.  Patient taking differently: Take 7.5 mg by mouth daily at 12 noon. PRN   Lesleigh Noe, MD Taking Active   empagliflozin (JARDIANCE) 25 MG TABS tablet [id number] Yes Take 1 tablet (25 mg total) by mouth daily before breakfast. Lesleigh Noe, MD Taking Active   glucose blood (ACCU-CHEK AVIVA PLUS) test strip [id number] Yes Use as instructed to test blood sugar 2 times daily E11.45 Shamleffer, Melanie Crazier, MD Taking Active   glucose blood test strip [id number] Yes 1 each by Other route 2 (two) times daily. Use as instructed to check blood sugar 2 times daily with OneTouch meter. Lesleigh Noe, MD Taking Active   Insulin Degludec (TRESIBA) 100 UNIT/ML SOLN [id number] Yes Inject 10 Units into the skin daily with breakfast. Lesleigh Noe, MD Taking Active   metFORMIN (GLUCOPHAGE-XR) 500 MG 24 hr tablet [id number] Yes Take 2 tablets (1,000 mg total) by mouth daily with breakfast. (2 tablets) Lesleigh Noe, MD Taking Active   NEEDLE, DISP, 30 G (BD DISP NEEDLES) 30G X 1/2" MISC [id number] Yes Use as directed with insulin Lesleigh Noe, MD Taking Active   omeprazole (PRILOSEC) 20 MG capsule [id number] Yes Take 1 capsule (20 mg total) by mouth every other day. Lesleigh Noe, MD Taking Active   pravastatin (PRAVACHOL)  20 MG tablet [id number] Yes TAKE 1 TABLET BY MOUTH DAILY WITH EVENING MEAL Lesleigh Noe, MD Taking Active             Patient Active Problem List   Diagnosis Date Noted   B12 deficiency 01/24/2022   Weight loss 01/24/2022   Other fatigue 01/02/2022   Postural dizziness 05/23/2021   Generalized anxiety disorder 04/18/2021   Muscle spasm 04/18/2021   Diarrhea 01/08/2021   Osteopenia 01/01/2021   Thumb pain, left 08/21/2020   Shortness of breath 07/09/2020   Tinnitus of both ears 07/09/2020   Type 2 diabetes mellitus with hyperglycemia, without long-term current use of insulin ([place]) 10/03/2019   Type 2 diabetes mellitus with stage 3a chronic kidney disease, without long-term current use of insulin ([place]) 10/03/2019   Disease related peripheral neuropathy 08/22/2019   Urinary frequency 05/18/2019   Stress incontinence of urine 02/21/2019   Grief 07/15/2018   Pelvic prolapse 01/22/2017   Hyperlipidemia associated with type 2 diabetes mellitus ([place]) 05/03/2014   GERD (gastroesophageal reflux disease) 12/05/2013   CKD (chronic kidney disease) stage 3, GFR 30-59 ml/min ([place]) 11/27/2011  HYPERKALEMIA 11/30/2009   INSOMNIA 11/21/2009   FATIGUE 11/21/2009   Diabetes mellitus with renal manifestation ([place]) 07/25/2009   Essential hypertension 07/25/2009   COLONIC POLYPS, HX OF 07/25/2009    Immunization History  Administered Date(s) Administered   Influenza Split 06/03/2012   Influenza,inj,Quad PF,6+ Mos 08/10/2013, 05/03/2014, 05/06/2017, 07/15/2018, 05/18/2019   PFIZER(Purple Top)SARS-COV-2 Vaccination 09/18/2019, 10/11/2019   PPD Test 04/19/2013   Pneumococcal Conjugate-13 10/02/2014   Pneumococcal Polysaccharide-23 04/25/2013   Td 08/19/2007    Conditions to be addressed/monitored:  Hypertension, Hyperlipidemia, Diabetes, GERD, Anxiety, and Osteopenia  Care Plan : [place]  Updates made by Charlton Haws, [place] since 03/17/2022 12:00 AM     Problem:  Hypertension, Hyperlipidemia, Diabetes, GERD, Anxiety, and Osteopenia      Long-Range Goal: Disease mgmt   Start Date: 07/19/2021  Expected End Date: 07/19/2022  This Visit's Progress: On track  Recent Progress: On track  Priority: High  Note:   Current Barriers:  Unable to independently monitor therapeutic efficacy Unable to achieve control of Diabetes  Struggles to adhere to prescribed medication regimen  Pharmacist Clinical Goal(s):  Patient will achieve adherence to monitoring guidelines and medication adherence to achieve therapeutic efficacy achieve improvement in diabetes as evidenced by A1c < 8% adhere to prescribed medication regimen as evidenced by pt report through collaboration with PharmD and provider.   Interventions: 1:1 collaboration with Lesleigh Noe, MD regarding development and update of comprehensive plan of care as evidenced by provider attestation and co-signature Inter-disciplinary care team collaboration (see longitudinal plan of care) Comprehensive medication review performed; medication list updated in electronic medical record  Hypertension (BP goal <130/80) -Controlled - BP fluctuates at home; pt is off medication now; Pt reports drinking plenty of water lately ("more water than I've ever drank in my life") -Home BP: 95/59 (standing), 142/73 (sitting) -Current treatment: None -Medications previously tried: lisinopril (low BP) -Educated on BP goals and benefits of medications for prevention of heart attack, stroke and kidney damage; Importance of home blood pressure monitoring; -Counseled to monitor BP at home weekly -Recommended to continue current medication  Hyperlipidemia: (LDL goal < 100) -Query controlled - LDL 96 (07/2020), overdue for repeat lipid panel; pt endorses compliance with statin -Current treatment: Pravastatin 20 mg daily - Appropriate, Effective, Safe, Accessible -Educated on Cholesterol goals; Benefits of statin for ASCVD risk  reduction; -Recommended to continue current medication; repeat lipid panel at annual visit  Diabetes (A1c goal <8%) -Uncontrolled - A1c 5.7% (0/1779) on Trulicity 1.5 mg; she has since been switched to [place] due to side effects with Trulicity -Previously metformin was reduced to 1000 mg/day due to diarrhea, which improved with lower dose -Dexcom training completed today - see summary -Current home glucose readings fasting glucose: 124 post prandial glucose: 255 -Current medications: Jardiance 25 mg daily - Appropriate, Query Effective Metformin ER 500 mg -2 tablet daily -Appropriate, Query Effective Tresiba 10 units daily -Appropriate, Query Effective Dexcom G7 -Medications previously tried: Januvia, Rybelsus, glipizide (10 years of tx), Trulicity -Educated on T9Q and blood sugar goals; Complications of diabetes including kidney damage, retinal damage, and cardiovascular disease; -Recommend to continue current medication  Osteopenia (Goal prevent fractures) -Controlled -Last DEXA Scan: 12/25/20   T-Score femoral neck: -1.5  T-Score total hip: -0.8  T-Score lumbar spine: -0.6  10-year probability of major osteoporotic fracture: 16%  10-year probability of hip fracture: 2.6% -Patient is not a candidate for pharmacologic treatment -Current treatment  None -Recommend [phone] units of vitamin D  daily. Recommend 1200 mg of calcium daily from dietary and supplemental sources. Recommend weight-bearing and muscle strengthening exercises for building and maintaining bone density.  Health Maintenance -Vaccine gaps: Flu, covid booster, Shingrix, TD booster  Patient Goals/Self-Care Activities Patient will:  - take medications as prescribed as evidenced by patient report and record review focus on medication adherence by routine check blood pressure weekly, document, and provide at future appointments check blood sugar daily, document, and provide at future appointments          Medication Assistance: None required.  Patient affirms current coverage meets needs.  Compliance/Adherence/Medication fill history: Care Gaps: Cologuard (due 04/26/21) AWV due  Star-Rating Drugs:   Medication Access: Within the past 30 days, how often has patient missed a dose of medication? 0 Is a pillbox or other method used to improve adherence? No  Factors that may affect medication adherence? adverse effects of medications Are meds synced by current pharmacy? No  Are meds delivered by current pharmacy? No  Does patient experience delays in picking up medications due to transportation concerns? No   [facility] Services Reviewed: Is patient disadvantaged to use [facility]?: No  Current Rx insurance plan: [employer] [place] Name and location of Current pharmacy:  [facility], [address] [place] [address] Phone: [phone] Fax: [phone]  [facility] services reviewed with patient today?: No  Patient requests to transfer care to [facility]?: No  Reason patient declined to change pharmacies: Loyalty to other pharmacy/Patient preference   Care Plan and Follow Up Patient Decision:  Patient agrees to Care Plan and Follow-up.  Plan: Telephone follow up appointment with care management team member scheduled for:  2 weeks  Charlene Brooke, PharmD, [facility] Clinical Pharmacist [facility] [phone]

## 2022-03-27 ENCOUNTER — Telehealth: Payer: Self-pay | Admitting: Family Medicine

## 2022-03-27 ENCOUNTER — Telehealth: Payer: Self-pay

## 2022-03-27 NOTE — Telephone Encounter (Signed)
Patient is requesting a call back from [place] about her medication. Call back is [phone]

## 2022-03-27 NOTE — Telephone Encounter (Signed)
Spoke with patient, she is in [place] visiting her sister and she forgot all of her medications at home. She was able to secure a supply of insulin but her insurance will not let her get short 6-day supplies of her pills. Advised she ask pharmacy to do a vacation override for short supply, if that does not work advised she just get generic medications - she will be ok with Jardiance for a week until she returns. Pt voiced understanding.

## 2022-03-27 NOTE — Progress Notes (Signed)
    Chronic Care Management Pharmacy Assistant   Name: Wanda [name]  MRN: [id number] DOB: 1945/10/20  Reason for Encounter: CCM (Appointment Reminder)  Medications: Outpatient Encounter Medications as of 03/27/2022  Medication Sig   Accu-Chek Softclix Lancets lancets 2 (two) times daily.   acetaminophen (TYLENOL) 325 MG tablet Take 2 tablets by mouth as needed.   Blood Glucose Monitoring Suppl (ACCU-CHEK GUIDE ME) w/Device KIT USE AS DIRECTED TO CHECK BLOOD SUGAR TWICE DAILY   busPIRone (BUSPAR) 15 MG tablet Take 0.5 tablets (7.5 mg total) by mouth 2 (two) times daily. (Patient taking differently: Take 7.5 mg by mouth daily at 12 noon. PRN)   empagliflozin (JARDIANCE) 25 MG TABS tablet Take 1 tablet (25 mg total) by mouth daily before breakfast.   glucose blood (ACCU-CHEK AVIVA PLUS) test strip Use as instructed to test blood sugar 2 times daily E11.45   glucose blood test strip 1 each by Other route 2 (two) times daily. Use as instructed to check blood sugar 2 times daily with OneTouch meter.   Insulin Degludec (TRESIBA) 100 UNIT/ML SOLN Inject 10 Units into the skin daily with breakfast.   metFORMIN (GLUCOPHAGE-XR) 500 MG 24 hr tablet Take 2 tablets (1,000 mg total) by mouth daily with breakfast. (2 tablets)   NEEDLE, DISP, 30 G (BD DISP NEEDLES) 30G X 1/2" MISC Use as directed with insulin   omeprazole (PRILOSEC) 20 MG capsule Take 1 capsule (20 mg total) by mouth every other day.   pravastatin (PRAVACHOL) 20 MG tablet TAKE 1 TABLET BY MOUTH DAILY WITH EVENING MEAL   No facility-administered encounter medications on file as of 03/27/2022.   Wanda [name] was contacted to remind of upcoming telephone visit with Charlene Brooke on 04/01/2022 at 3:00. Patient was reminded to have any blood glucose and blood pressure readings available for review at appointment.   Message was left reminding patient of appointment.  CCM referral has been placed prior to visit?  Yes   Star Rating  Drugs: Medication:  Last Fill: Day Supply Metformin 500 mg       02/28/22          30 - Refill waiting for pick up  Pravastatin 20 mg       12/23/21          90 - Refill waiting for pick up Jardiance 25 mg         01/03/22          90 Verified with [place], CPP notified  Wanda [name], [place] Pharmacy Assistant [phone]

## 2022-04-01 ENCOUNTER — Ambulatory Visit: Payer: Medicare PPO | Admitting: Pharmacist

## 2022-04-01 DIAGNOSIS — I10 Essential (primary) hypertension: Secondary | ICD-10-CM

## 2022-04-01 DIAGNOSIS — E1169 Type 2 diabetes mellitus with other specified complication: Secondary | ICD-10-CM

## 2022-04-01 DIAGNOSIS — E1165 Type 2 diabetes mellitus with hyperglycemia: Secondary | ICD-10-CM

## 2022-04-01 NOTE — Progress Notes (Signed)
Chronic Care Management Pharmacy Note  04/01/2022 Name:  Wanda [name] MRN:  [id number] DOB:  1946/03/10  Summary: CCM F/U visit -DM: A1c 8.5% (01/2022); pt is compliant with insulin and is wearing Dexcom - reviewed AGP: average glucose 186, GMI 7.7%, time-in-range 48% suggests improved DM control; of note pt was [place] the past week and diet was not typical   Recommendations/Changes made from today's visit: -No med changes; f/u 2 weeks for CGM update   Plan: -Pharmacist follow up televisit scheduled for 2 weeks -PCP F/U 05/05/22     Subjective: Wanda [name] is an 77 y.o. year old female who is a primary patient of Cody, Jobe Marker, MD.  The CCM team was consulted for assistance with disease management and care coordination needs.    Engaged with patient by telephone for follow up visit in response to provider referral for pharmacy case management and/or care coordination services.   Consent to Services:  The patient was given information about Chronic Care Management services, agreed to services, and gave verbal consent prior to initiation of services.  Please see initial visit note for detailed documentation.   Patient Care Team: Lesleigh Noe, MD as PCP - General (Family Medicine) Katy Apo, MD as Consulting Physician (Ophthalmology) [facility], Melanie Crazier, MD as Consulting Physician (Endocrinology) Willia Craze, NP as Nurse Practitioner (Gastroenterology) Charlton Haws, [facility] as Pharmacist (Pharmacist)  Recent office visits: 03/03/22 TE - start Tyler Aas, stop trulicity.  01/24/22 Dr Einar Pheasant OV: f/u DM - A1c 3.4%; increase Trulicity to 3 mg. F/u 3 months. Encouraged Buspar BID as prescribed. Referred for therapy. Pt lost 8-10 lbs. Declined nutrition referral.  05/23/2021 - [name], MD - Patient presented for follow up for diabetes. Continue Buspar 7.5 mg BID due to significant improvement. Change: Decrease Lisinopril 10 > 8m due to orthostatic hypotension.    04/18/2021 - [name] MD - Patient presented for diabetes follow up. Referral to Gastroenterology due to nausea and difficulty swallowing. Change: switch glipizide 5 mg bid to glipizide XL 10 mg daily. Change: Increase jardiance to 25 mg. Change: Increase buspar 3.75 > 7.5 mg and to 15 mg daily if no improvement after 2 weeks for anxiety.   Recent consult visits: 01/23/22 PA Amy Esterwood (GI): weight loss - 6-7 mo hx of diarrhea. Decrease in appetite 3-4 mos. Asked pt to stop Trulicity - 1 month trial off.  07/23/21 Dr SCandiss Norse(Nephrology): f/u CKD. Stable. No changes. F/U 1 year.  04/02/2021 - SJohny Drilling RN - Nutrition - Patient presented for diabetes. Recommended: Check blood sugars 1 x day before breakfast or 2 hrs after supper every day.   02/25/2021 - TCasimiro Needle RN - Urology - Telephone - Start: cephalexin ([facility] 500 MG capsule for 5 days.   02/20/2021 - CMaryland Pink- Urology - Patient presented for urinary frequency and prolapse of vaginal wall. No other information provided.   02/19/2021 - SJohny Drilling RN - Nutrition - Patient presented for diabetes. Recommended: Eat 3 meals day, 1-2  snacks a day, space meals 4-6 hours apart, don't skip meals and limit fried foods, desserts and sweets. Complete 3 Day Food Record and bring to next appt.   Hospital visits: None in previous 6 months   Objective:  Lab Results  Component Value Date   CREATININE 1.10 01/02/2022   BUN 20 01/02/2022   GFR 49.62 (L) 01/02/2022   GFRNONAA 57 (L) 06/23/2014   GFRAA 67 (L) 06/23/2014   NA 142 01/02/2022  K 5.2 No hemolysis seen (H) 01/02/2022   CALCIUM 10.0 01/02/2022   CO2 27 01/02/2022   GLUCOSE 172 (H) 01/02/2022    Lab Results  Component Value Date/Time   HGBA1C 8.5 (A) 01/24/2022 10:26 AM   HGBA1C 9.6 (A) 10/11/2021 10:49 AM   HGBA1C 9.3 (H) 07/06/2020 02:33 PM   HGBA1C 8.0 (H) 08/23/2019 02:36 PM   HGBA1C 7.7 08/25/2018 10:28 AM   GFR 49.62 (L) 01/02/2022  10:04 AM   GFR 61.34 07/06/2020 02:33 PM   MICROALBUR 1.3 10/02/2014 03:01 PM   MICROALBUR 1.9 08/05/2013 09:20 AM    Last diabetic Eye exam:  Lab Results  Component Value Date/Time   HMDIABEYEEXA No Retinopathy 11/01/2021 12:00 AM    Last diabetic Foot exam: No results found for: "HMDIABFOOTEX"   Lab Results  Component Value Date   CHOL 181 07/06/2020   HDL 39.30 07/06/2020   LDLCALC 84 05/05/2017   LDLDIRECT 96.0 07/06/2020   TRIG 279.0 (H) 07/06/2020   CHOLHDL 5 07/06/2020       Latest Ref Rng & Units 01/02/2022   10:04 AM 07/16/2021   12:00 AM 07/06/2020    2:33 PM  Hepatic Function  Total Protein 6.0 - 8.3 g/dL 7.3   7.4   Albumin 3.5 - 5.2 g/dL 4.2  4.3     4.2   AST 0 - 37 U/L 18   32   ALT 0 - 35 U/L 17   34   Alk Phosphatase 39 - 117 U/L 90   90   Total Bilirubin 0.2 - 1.2 mg/dL 0.5   0.4      This result is from an external source.    Lab Results  Component Value Date/Time   TSH 3.43 01/02/2022 10:04 AM   TSH 3.57 08/23/2019 02:36 PM       Latest Ref Rng & Units 01/02/2022   10:04 AM 07/16/2021   12:00 AM 07/06/2020    2:33 PM  CBC  WBC 4.0 - 10.5 K/uL 6.8  7.2     7.8   Hemoglobin 12.0 - 15.0 g/dL 13.0  13.2     12.8   Hematocrit 36.0 - 46.0 % 40.4  40     38.8   Platelets 150.0 - 400.0 K/uL 274.0  283     291.0      This result is from an external source.    Lab Results  Component Value Date/Time   VD25OH 21.70 (L) 08/23/2019 02:36 PM    Clinical ASCVD: No  The 10-year ASCVD risk score (Arnett DK, et al., 2019) is: 21.9%   Values used to calculate the score:     Age: 77 years     Sex: Female     Is Non-Hispanic African American: No     Diabetic: Yes     Tobacco smoker: No     Systolic Blood Pressure: 132 mmHg     Is BP treated: Yes     HDL Cholesterol: 39.3 mg/dL     Total Cholesterol: 181 mg/dL       12/17/2021    2:51 PM 05/23/2021   12:52 PM 02/19/2021    2:04 PM  Depression screen PHQ 2/9  Decreased Interest 0 1 0  Down,  Depressed, Hopeless 0 0 0  PHQ - 2 Score 0 1 0  Altered sleeping  1   Tired, decreased energy  1   Change in appetite  1   Feeling bad or failure about yourself  0   Trouble concentrating  0   Moving slowly or fidgety/restless  0   Suicidal thoughts  0   PHQ-9 Score  4   Difficult doing work/chores  Not difficult at all      Social History   Tobacco Use  Smoking Status Never  Smokeless Tobacco Never   BP Readings from Last 3 Encounters:  01/24/22 100/70  01/23/22 124/68  01/02/22 108/60   Pulse Readings from Last 3 Encounters:  01/24/22 83  01/23/22 60  01/02/22 86   Wt Readings from Last 3 Encounters:  01/24/22 128 lb 5 oz (58.2 kg)  01/23/22 130 lb 9.6 oz (59.2 kg)  01/02/22 129 lb 6 oz (58.7 kg)   BMI Readings from Last 3 Encounters:  01/24/22 22.02 kg/m  01/23/22 22.42 kg/m  01/02/22 22.21 kg/m    Assessment/Interventions: Review of patient past medical history, allergies, medications, health status, including review of consultants reports, laboratory and other test data, was performed as part of comprehensive evaluation and provision of chronic care management services.   SDOH:  (Social Determinants of Health) assessments and interventions performed: Yes   SDOH Screenings   Alcohol Screen: Low Risk  (12/17/2021)   Alcohol Screen    Last Alcohol Screening Score (AUDIT): 0  Depression (PHQ2-9): Low Risk  (12/17/2021)   Depression (PHQ2-9)    PHQ-2 Score: 0  Financial Resource Strain: Low Risk  (12/17/2021)   Overall Financial Resource Strain (CARDIA)    Difficulty of Paying Living Expenses: Not hard at all  Food Insecurity: No Food Insecurity (12/17/2021)   Hunger Vital Sign    Worried About Running Out of Food in the Last Year: Never true    Ran Out of Food in the Last Year: Never true  Housing: Low Risk  (12/17/2021)   Housing    Last Housing Risk Score: 0  Physical Activity: Unknown (12/17/2021)   Exercise Vital Sign    Days of Exercise per Week:  Patient refused    Minutes of Exercise per Session: Patient refused  Social Connections: Moderately Integrated (12/17/2021)   Social Connection and Isolation Panel [NHANES]    Frequency of Communication with Friends and Family: More than three times a week    Frequency of Social Gatherings with Friends and Family: More than three times a week    Attends Religious Services: More than 4 times per year    Active Member of [employer] or Organizations: Yes    Attends Archivist Meetings: More than 4 times per year    Marital Status: Widowed  Stress: No Stress Concern Present (12/17/2021)   [place]    Feeling of Stress : Not at all  Tobacco Use: Low Risk  (01/23/2022)   Patient History    Smoking Tobacco Use: Never    Smokeless Tobacco Use: Never    Passive Exposure: Not on file  Transportation Needs: No Transportation Needs (12/17/2021)   PRAPARE - Transportation    Lack of Transportation (Medical): No    Lack of Transportation (Non-Medical): No    CCM Care Plan  Allergies  Allergen Reactions   Niacin     REACTION: unbearable hot flashes   Sulfamethoxazole-Trimethoprim Itching    Medications Reviewed Today     Reviewed by Charlton Haws, [facility] (Pharmacist) on 04/01/22 at [place] List Status: <None>   Medication Order Taking? Sig Documenting Provider Last Dose Status Informant  Accu-Chek Softclix Lancets lancets [id number] Yes 2 (  two) times daily. [provider] Taking Active   acetaminophen (TYLENOL) 325 MG tablet [id number] Yes Take 2 tablets by mouth as needed. [provider] Taking Active   Blood Glucose Monitoring Suppl (ACCU-CHEK GUIDE ME) w/Device KIT [id number] Yes USE AS DIRECTED TO CHECK BLOOD SUGAR TWICE DAILY [provider] Taking Active   busPIRone (BUSPAR) 15 MG tablet [id number] Yes Take 0.5 tablets (7.5 mg total) by mouth 2 (two) times daily.  Patient taking  differently: Take 7.5 mg by mouth daily at 12 noon. PRN   Lesleigh Noe, MD Taking Active   empagliflozin (JARDIANCE) 25 MG TABS tablet [id number] Yes Take 1 tablet (25 mg total) by mouth daily before breakfast. Lesleigh Noe, MD Taking Active   glucose blood (ACCU-CHEK AVIVA PLUS) test strip [id number] Yes Use as instructed to test blood sugar 2 times daily E11.45 Shamleffer, Melanie Crazier, MD Taking Active   glucose blood test strip [id number] Yes 1 each by Other route 2 (two) times daily. Use as instructed to check blood sugar 2 times daily with OneTouch meter. Lesleigh Noe, MD Taking Active   Insulin Degludec (TRESIBA) 100 UNIT/ML SOLN [id number] Yes Inject 10 Units into the skin daily with breakfast. Lesleigh Noe, MD Taking Active   metFORMIN (GLUCOPHAGE-XR) 500 MG 24 hr tablet [id number] Yes Take 2 tablets (1,000 mg total) by mouth daily with breakfast. (2 tablets) Lesleigh Noe, MD Taking Active   NEEDLE, DISP, 30 G (BD DISP NEEDLES) 30G X 1/2" MISC [id number] Yes Use as directed with insulin Lesleigh Noe, MD Taking Active   omeprazole (PRILOSEC) 20 MG capsule [id number] Yes Take 1 capsule (20 mg total) by mouth every other day. Lesleigh Noe, MD Taking Active   pravastatin (PRAVACHOL) 20 MG tablet [id number] Yes TAKE 1 TABLET BY MOUTH DAILY WITH EVENING MEAL Lesleigh Noe, MD Taking Active             Patient Active Problem List   Diagnosis Date Noted   B12 deficiency 01/24/2022   Weight loss 01/24/2022   Other fatigue 01/02/2022   Postural dizziness 05/23/2021   Generalized anxiety disorder 04/18/2021   Muscle spasm 04/18/2021   Diarrhea 01/08/2021   Osteopenia 01/01/2021   Thumb pain, left 08/21/2020   Shortness of breath 07/09/2020   Tinnitus of both ears 07/09/2020   Type 2 diabetes mellitus with hyperglycemia, without long-term current use of insulin ([place]) 10/03/2019   Type 2 diabetes mellitus with stage 3a chronic kidney disease, without long-term current use  of insulin ([place]) 10/03/2019   Disease related peripheral neuropathy 08/22/2019   Urinary frequency 05/18/2019   Stress incontinence of urine 02/21/2019   Grief 07/15/2018   Pelvic prolapse 01/22/2017   Hyperlipidemia associated with type 2 diabetes mellitus ([place]) 05/03/2014   GERD (gastroesophageal reflux disease) 12/05/2013   CKD (chronic kidney disease) stage 3, GFR 30-59 ml/min ([place]) 11/27/2011   HYPERKALEMIA 11/30/2009   INSOMNIA 11/21/2009   FATIGUE 11/21/2009   Diabetes mellitus with renal manifestation ([place]) 07/25/2009   Essential hypertension 07/25/2009   COLONIC POLYPS, HX OF 07/25/2009    Immunization History  Administered Date(s) Administered   Influenza Split 06/03/2012   Influenza,inj,Quad PF,6+ Mos 08/10/2013, 05/03/2014, 05/06/2017, 07/15/2018, 05/18/2019   PFIZER(Purple Top)SARS-COV-2 Vaccination 09/18/2019, 10/11/2019   PPD Test 04/19/2013   Pneumococcal Conjugate-13 10/02/2014   Pneumococcal Polysaccharide-23 04/25/2013   Td 08/19/2007    Conditions to be addressed/monitored:  Hypertension, Hyperlipidemia, Diabetes, GERD, Anxiety, and Osteopenia  Care Plan : CCM  Pharmacy Care Plan  Updates made by Charlton Haws, RPH since 04/01/2022 12:00 AM     Problem: Hypertension, Hyperlipidemia, Diabetes, GERD, Anxiety, and Osteopenia      Long-Range Goal: Disease mgmt   Start Date: 07/19/2021  Expected End Date: 07/19/2022  This Visit's Progress: On track  Recent Progress: On track  Priority: High  Note:   Current Barriers:  Unable to achieve control of Diabetes  Struggles to adhere to prescribed medication regimen  Pharmacist Clinical Goal(s):  Patient will achieve improvement in diabetes as evidenced by A1c < 8% adhere to prescribed medication regimen as evidenced by pt report through collaboration with PharmD and provider.   Interventions: 1:1 collaboration with Lesleigh Noe, MD regarding development and update of comprehensive plan of care as  evidenced by provider attestation and co-signature Inter-disciplinary care team collaboration (see longitudinal plan of care) Comprehensive medication review performed; medication list updated in electronic medical record  Hypertension (BP goal <130/80) -Controlled - BP fluctuates at home; pt is off medication now; Pt reports drinking plenty of water lately ("more water than I've ever drank in my life") -Home BP: 95/59 (standing), 142/73 (sitting) -Current treatment: None -Medications previously tried: lisinopril (low BP) -Educated on BP goals and benefits of medications for prevention of heart attack, stroke and kidney damage; Importance of home blood pressure monitoring; -Counseled to monitor BP at home weekly -Recommended to continue current medication  Hyperlipidemia: (LDL goal < 100) -Query controlled - LDL 96 (07/2020), overdue for repeat lipid panel; pt endorses compliance with statin -Current treatment: Pravastatin 20 mg daily - Appropriate, Effective, Safe, Accessible -Educated on Cholesterol goals; Benefits of statin for ASCVD risk reduction; -Recommended to continue current medication; repeat lipid panel at annual visit  Diabetes (A1c goal <8%) -Uncontrolled - A1c 1.5% (03/3093) on Trulicity 1.5 mg; she has since been switched to [place] due to side effects with Trulicity -Previously metformin was reduced to 1000 mg/day due to diarrhea, which improved with lower dose -Dexcom training completed 03/17/22 -Reviewed AGP report: Generated at: Tue, Apr 01, 2022 3:12 PM EDT Reporting period: Sat Mar 15, 2022 - Fri Mar 28, 2022 ----------------------------- Glucose Details Average glucose: 186 mg/dL Standard deviation: 47 mg/dL GMI: 7.7% ----------------------------- Time in Range Very High: 9% High: 43% In Range: 48% Low: 0% Very Low: 0%  Target Range:70-180 mg/dL ----------------------------- CGM Details Sensor usage: 71% Days with CGM data: 10/14  -Current  medications: Jardiance 25 mg daily - Appropriate, Query Effective Metformin ER 500 mg -2 tablet daily -Appropriate, Query Effective Tresiba 10 units daily -Appropriate, Query Effective Dexcom G7 -Medications previously tried: Januvia, Rybelsus, glipizide (10 years of tx), Trulicity (GI/wt loss) -Educated on A1c and blood sugar goals; Complications of diabetes including kidney damage, retinal damage, and cardiovascular disease; -Reviewed AGP report with patient; she has a pattern of post-prandial highs but was also on vacation in [place] this week so diet was not typical -Recommend to continue current medication; f/u 2 weeks  Osteopenia (Goal prevent fractures) -Controlled -Last DEXA Scan: 12/25/20   T-Score femoral neck: -1.5  T-Score total hip: -0.8  T-Score lumbar spine: -0.6  10-year probability of major osteoporotic fracture: 16%  10-year probability of hip fracture: 2.6% -Patient is not a candidate for pharmacologic treatment -Current treatment  None -Recommend [phone] units of vitamin D daily. Recommend 1200 mg of calcium daily from dietary and supplemental sources. Recommend weight-bearing and muscle strengthening exercises for building and maintaining bone density.  Health Maintenance -Vaccine gaps: Flu, covid booster, Shingrix,  TD booster  Patient Goals/Self-Care Activities Patient will:  - take medications as prescribed as evidenced by patient report and record review focus on medication adherence by routine heck blood pressure weekly, document, and provide at future appointments check blood sugar daily, document, and provide at future appointments     Medication Assistance: None required.  Patient affirms current coverage meets needs.  Compliance/Adherence/Medication fill history: Care Gaps: Cologuard (due 04/26/21) AWV due  Star-Rating Drugs: Pravastatin - PDC 52% (LF 03/27/22 x 90 ds) Metformin - PDC 84% (LF 03/27/22 x 30 ds) Jardiance - PDC 82% (LF 01/03/22 x 90  ds)  Medication Access: Within the past 30 days, how often has patient missed a dose of medication? 0 Is a pillbox or other method used to improve adherence? No  Factors that may affect medication adherence? adverse effects of medications Are meds synced by current pharmacy? No  Are meds delivered by current pharmacy? No  Does patient experience delays in picking up medications due to transportation concerns? No   [facility] Services Reviewed: Is patient disadvantaged to use [facility]?: No  Current Rx insurance plan: [employer] [place] Name and location of Current pharmacy:  [facility], [address] [place] [address] Phone: [phone] Fax: [phone]  [facility] services reviewed with patient today?: No  Patient requests to transfer care to [facility] Pharmacy?: No  Reason patient declined to change pharmacies: Loyalty to other pharmacy/Patient preference   Care Plan and Follow Up Patient Decision:  Patient agrees to Care Plan and Follow-up.  Plan: Telephone follow up appointment with care management team member scheduled for:  2 weeks  Charlene Brooke, PharmD, [facility] Clinical Pharmacist [place] [facility] at [facility] [phone]

## 2022-04-01 NOTE — Patient Instructions (Signed)
Visit Information  Phone number for Pharmacist: [phone]   Goals Addressed   None     Care Plan : [place]  Updates made by Charlton Haws, RPH since 04/01/2022 12:00 AM     Problem: Hypertension, Hyperlipidemia, Diabetes, GERD, Anxiety, and Osteopenia      Long-Range Goal: Disease mgmt   Start Date: 07/19/2021  Expected End Date: 07/19/2022  This Visit's Progress: On track  Recent Progress: On track  Priority: High  Note:   Current Barriers:  Unable to achieve control of Diabetes  Struggles to adhere to prescribed medication regimen  Pharmacist Clinical Goal(s):  Patient will achieve improvement in diabetes as evidenced by A1c < 8% adhere to prescribed medication regimen as evidenced by pt report through collaboration with PharmD and provider.   Interventions: 1:1 collaboration with Lesleigh Noe, MD regarding development and update of comprehensive plan of care as evidenced by provider attestation and co-signature Inter-disciplinary care team collaboration (see longitudinal plan of care) Comprehensive medication review performed; medication list updated in electronic medical record  Hypertension (BP goal <130/80) -Controlled - BP fluctuates at home; pt is off medication now; Pt reports drinking plenty of water lately ("more water than I've ever drank in my life") -Home BP: 95/59 (standing), 142/73 (sitting) -Current treatment: None -Medications previously tried: lisinopril (low BP) -Educated on BP goals and benefits of medications for prevention of heart attack, stroke and kidney damage; Importance of home blood pressure monitoring; -Counseled to monitor BP at home weekly -Recommended to continue current medication  Hyperlipidemia: (LDL goal < 100) -Query controlled - LDL 96 (07/2020), overdue for repeat lipid panel; pt endorses compliance with statin -Current treatment: Pravastatin 20 mg daily - Appropriate, Effective, Safe,  Accessible -Educated on Cholesterol goals; Benefits of statin for ASCVD risk reduction; -Recommended to continue current medication; repeat lipid panel at annual visit  Diabetes (A1c goal <8%) -Uncontrolled - A1c 7.0% (01/2375) on Trulicity 1.5 mg; she has since been switched to [place] due to side effects with Trulicity -Previously metformin was reduced to 1000 mg/day due to diarrhea, which improved with lower dose -Dexcom training completed 03/17/22 -Reviewed AGP report: Generated at: Tue, Apr 01, 2022 3:12 PM EDT Reporting period: Sat Mar 15, 2022 - Fri Mar 28, 2022 ----------------------------- Glucose Details Average glucose: 186 mg/dL Standard deviation: 47 mg/dL GMI: 7.7% ----------------------------- Time in Range Very High: 9% High: 43% In Range: 48% Low: 0% Very Low: 0%  Target Range:70-180 mg/dL ----------------------------- CGM Details Sensor usage: 71% Days with CGM data: 10/14  -Current medications: Jardiance 25 mg daily - Appropriate, Query Effective Metformin ER 500 mg -2 tablet daily -Appropriate, Query Effective Tresiba 10 units daily -Appropriate, Query Effective Dexcom G7 -Medications previously tried: Januvia, Rybelsus, glipizide (10 years of tx), Trulicity (GI/wt loss) -Educated on A1c and blood sugar goals; Complications of diabetes including kidney damage, retinal damage, and cardiovascular disease; -Reviewed AGP report with patient; she has a pattern of post-prandial highs but was also on vacation in [place] this week so diet was not typical -Recommend to continue current medication; f/u 2 weeks  Osteopenia (Goal prevent fractures) -Controlled -Last DEXA Scan: 12/25/20   T-Score femoral neck: -1.5  T-Score total hip: -0.8  T-Score lumbar spine: -0.6  10-year probability of major osteoporotic fracture: 16%  10-year probability of hip fracture: 2.6% -Patient is not a candidate for pharmacologic treatment -Current treatment  None -Recommend  [phone] units of vitamin D daily. Recommend 1200 mg of calcium daily from dietary and  supplemental sources. Recommend weight-bearing and muscle strengthening exercises for building and maintaining bone density.  Health Maintenance -Vaccine gaps: Flu, covid booster, Shingrix, TD booster  Patient Goals/Self-Care Activities Patient will:  - take medications as prescribed as evidenced by patient report and record review focus on medication adherence by routine heck blood pressure weekly, document, and provide at future appointments check blood sugar daily, document, and provide at future appointments      Patient verbalizes understanding of instructions and care plan provided today and agrees to view in [place]. Active MyChart status and patient understanding of how to access instructions and care plan via MyChart confirmed with patient.    Telephone follow up appointment with pharmacy team member scheduled for: 2 weeks  Charlene Brooke, PharmD, [facility] Clinical Pharmacist [place] [facility] at [facility] [phone]

## 2022-04-03 ENCOUNTER — Telehealth: Payer: Self-pay | Admitting: Family Medicine

## 2022-04-03 DIAGNOSIS — Z87448 Personal history of other diseases of urinary system: Secondary | ICD-10-CM | POA: Diagnosis not present

## 2022-04-03 DIAGNOSIS — M8580 Other specified disorders of bone density and structure, unspecified site: Secondary | ICD-10-CM

## 2022-04-03 DIAGNOSIS — E1169 Type 2 diabetes mellitus with other specified complication: Secondary | ICD-10-CM

## 2022-04-03 DIAGNOSIS — I10 Essential (primary) hypertension: Secondary | ICD-10-CM

## 2022-04-03 DIAGNOSIS — R81 Glycosuria: Secondary | ICD-10-CM | POA: Diagnosis not present

## 2022-04-03 DIAGNOSIS — E785 Hyperlipidemia, unspecified: Secondary | ICD-10-CM | POA: Diagnosis not present

## 2022-04-03 DIAGNOSIS — N390 Urinary tract infection, site not specified: Secondary | ICD-10-CM | POA: Diagnosis not present

## 2022-04-03 DIAGNOSIS — Z7984 Long term (current) use of oral hypoglycemic drugs: Secondary | ICD-10-CM

## 2022-04-03 DIAGNOSIS — R350 Frequency of micturition: Secondary | ICD-10-CM | POA: Diagnosis not present

## 2022-04-03 NOTE — Telephone Encounter (Signed)
Patient came in office and dropped off Wanda [name] for employment for [facility] paperwork for PCP to fill out and has been placed in PCP folder.Call back number [phone]

## 2022-04-03 NOTE — Telephone Encounter (Signed)
Patient stated that she can't get her dexcon g7 programmed to her phone. Call back number [phone].

## 2022-04-04 NOTE — Telephone Encounter (Signed)
Patient scheduled for Sep 11,2023 at 8:40.

## 2022-04-08 NOTE — Progress Notes (Signed)
Called patient and she got it working.  Charlene Brooke, CPP notified  Marijean Niemann, [place] Clinical Pharmacy Assistant [phone]

## 2022-04-13 DIAGNOSIS — E1165 Type 2 diabetes mellitus with hyperglycemia: Secondary | ICD-10-CM | POA: Diagnosis not present

## 2022-04-13 DIAGNOSIS — Z794 Long term (current) use of insulin: Secondary | ICD-10-CM | POA: Diagnosis not present

## 2022-04-14 ENCOUNTER — Ambulatory Visit (INDEPENDENT_AMBULATORY_CARE_PROVIDER_SITE_OTHER): Payer: Medicare PPO | Admitting: Family Medicine

## 2022-04-14 ENCOUNTER — Telehealth: Payer: Self-pay

## 2022-04-14 VITALS — BP 120/70 | HR 71 | Temp 96.9°F | Ht 64.5 in | Wt 126.2 lb

## 2022-04-14 DIAGNOSIS — Z111 Encounter for screening for respiratory tuberculosis: Secondary | ICD-10-CM | POA: Diagnosis not present

## 2022-04-14 DIAGNOSIS — N1831 Chronic kidney disease, stage 3a: Secondary | ICD-10-CM

## 2022-04-14 DIAGNOSIS — E785 Hyperlipidemia, unspecified: Secondary | ICD-10-CM | POA: Diagnosis not present

## 2022-04-14 DIAGNOSIS — E1169 Type 2 diabetes mellitus with other specified complication: Secondary | ICD-10-CM | POA: Diagnosis not present

## 2022-04-14 DIAGNOSIS — E1122 Type 2 diabetes mellitus with diabetic chronic kidney disease: Secondary | ICD-10-CM

## 2022-04-14 DIAGNOSIS — N1832 Chronic kidney disease, stage 3b: Secondary | ICD-10-CM | POA: Diagnosis not present

## 2022-04-14 DIAGNOSIS — Z0000 Encounter for general adult medical examination without abnormal findings: Secondary | ICD-10-CM

## 2022-04-14 NOTE — Progress Notes (Signed)
    Chronic Care Management Pharmacy Assistant   Name: Wanda [name]  MRN: [id number] DOB: 07-14-1946  Reason for Encounter: CCM (Appointment Reminder)  Medications: Outpatient Encounter Medications as of 04/14/2022  Medication Sig   Accu-Chek Softclix Lancets lancets 2 (two) times daily.   acetaminophen (TYLENOL) 325 MG tablet Take 2 tablets by mouth as needed.   Blood Glucose Monitoring Suppl (ACCU-CHEK GUIDE ME) w/Device KIT USE AS DIRECTED TO CHECK BLOOD SUGAR TWICE DAILY   busPIRone (BUSPAR) 15 MG tablet Take 0.5 tablets (7.5 mg total) by mouth 2 (two) times daily. (Patient taking differently: Take 7.5 mg by mouth daily at 12 noon. PRN)   empagliflozin (JARDIANCE) 25 MG TABS tablet Take 1 tablet (25 mg total) by mouth daily before breakfast.   glucose blood (ACCU-CHEK AVIVA PLUS) test strip Use as instructed to test blood sugar 2 times daily E11.45   glucose blood test strip 1 each by Other route 2 (two) times daily. Use as instructed to check blood sugar 2 times daily with OneTouch meter.   Insulin Degludec (TRESIBA) 100 UNIT/ML SOLN Inject 10 Units into the skin daily with breakfast.   metFORMIN (GLUCOPHAGE-XR) 500 MG 24 hr tablet Take 2 tablets (1,000 mg total) by mouth daily with breakfast. (2 tablets)   NEEDLE, DISP, 30 G (BD DISP NEEDLES) 30G X 1/2" MISC Use as directed with insulin   omeprazole (PRILOSEC) 20 MG capsule Take 1 capsule (20 mg total) by mouth every other day.   pravastatin (PRAVACHOL) 20 MG tablet TAKE 1 TABLET BY MOUTH DAILY WITH EVENING MEAL   No facility-administered encounter medications on file as of 04/14/2022.   Wanda [name] was contacted to remind of upcoming telephone visit with Charlene Brooke on 04/17/2022 at 3:45. Patient was reminded to have any blood glucose and blood pressure readings available for review at appointment.   Message was left reminding patient of appointment.  CCM referral has been placed prior to visit?  Yes   Star Rating  Drugs: Medication:  Last Fill: Day Supply Metformin 500 mg 04/09/22 30 Pravastatin 20 mg 04/09/22 90 Jardiance 25 mg 01/03/22 90 - refill waiting for pick up Fill dates verified with [place], CPP notified  Marijean Niemann, [facility] Assistant [phone]

## 2022-04-14 NOTE — Assessment & Plan Note (Signed)
Return for labs in 2 weeks. Cont jardiance, trulicity, metformin, insulin

## 2022-04-14 NOTE — Progress Notes (Signed)
Annual Exam   Chief Complaint:  Chief Complaint  Patient presents with   Medicare Wellness    Will get flu shot at a later time.     History of Present Illness:  [name] is a 76 y.o. No obstetric history on file. who LMP was No LMP recorded. Patient is postmenopausal., presents today for her annual examination.    Has some work paperwork  Nutrition She does not get adequate calcium and Vitamin D in her diet. Diet: doing better with diabetic diet - the last 14 days not as good Exercise: not currently     Social History   Tobacco Use  Smoking Status Never  Smokeless Tobacco Never   Social History   Substance and Sexual Activity  Alcohol Use No   Alcohol/week: 0.0 standard drinks of alcohol   Social History   Substance and Sexual Activity  Drug Use No     General Health Dentist in the last year: Yes Eye doctor: yes  Safety The patient wears seatbelts: yes.     The patient feels safe at home and in their relationships: yes.   Menstrual:  Symptoms of menopause: still has occasionally has hot flashes  GYN She is not sexually active.   Breast Cancer Screening (Age 33-74):  There is FH of breast cancer. There is no FH of ovarian cancer. BRCA screening Not Indicated.  Last Mammogram: 06/2021 The patient does want a mammogram this year.    Colon Cancer Screening:  Age 21-75 yo - benefits outweigh the risk. Adults 68-85 yo who have never been screened benefit.  Benefits: 134000 people in 2016 will be diagnosed and 49,000 will die - early detection helps [place]: Complications 2/2 to colonoscopy High Risk (Colonoscopy): genetic disorder (Lynch syndrome or familial adenomatous polyposis), personal hx of IBD, previous adenomatous polyp, or previous colorectal cancer, FamHx start 10 years before the age at diagnosis, increased in males and black race  Options:  FIT - looks for hemoglobin (blood in the stool) - specific and fairly sensitive - must be done  annually Cologuard - looks for DNA and blood - more sensitive - therefore can have more false positives, every 3 years Colonoscopy - every 10 years if normal - sedation, bowl prep, must have someone drive you  Shared decision making and the patient had decided to do colonoscopy - up to date 2020.   Social History   Tobacco Use  Smoking Status Never  Smokeless Tobacco Never    Weight Wt Readings from Last 3 Encounters:  04/14/22 126 lb 4 oz (57.3 kg)  01/24/22 128 lb 5 oz (58.2 kg)  01/23/22 130 lb 9.6 oz (59.2 kg)   Patient has normal BMI  BMI Readings from Last 1 Encounters:  04/14/22 21.34 kg/m     Chronic disease screening Blood pressure monitoring:  BP Readings from Last 3 Encounters:  04/14/22 120/70  01/24/22 100/70  01/23/22 124/68    Lipid Monitoring: Indication for screening: age >32, obesity, diabetes, family hx, CV risk factors.  Lipid screening: Yes  Lab Results  Component Value Date   CHOL 181 07/06/2020   HDL 39.30 07/06/2020   LDLCALC 84 05/05/2017   LDLDIRECT 96.0 07/06/2020   TRIG 279.0 (H) 07/06/2020   CHOLHDL 5 07/06/2020     Diabetes Screening: age >70, overweight, family hx, PCOS, hx of gestational diabetes, at risk ethnicity Diabetes Screening screening: Yes  Lab Results  Component Value Date   HGBA1C 8.5 (A) 01/24/2022  Past Medical History:  Diagnosis Date   Anxiety    Colon polyps    Depression    DM (diabetes mellitus) ([place])    Female bladder prolapse    Fibromyalgia    "neurofibromyalgia"   Headache    HTN (hypertension)    Hyperlipidemia     Past Surgical History:  Procedure Laterality Date   COLONOSCOPY  03/28/2019   Dr Collene Mares   WRIST SURGERY  1969    Prior to Admission medications   Medication Sig Start Date End Date Taking? Authorizing Provider  Accu-Chek Softclix Lancets lancets 2 (two) times daily. 10/25/21  Yes [provider]  acetaminophen (TYLENOL) 325 MG tablet Take 2 tablets by mouth as  needed. 02/20/20  Yes [provider]  Blood Glucose Monitoring Suppl (ACCU-CHEK GUIDE ME) w/Device KIT USE AS DIRECTED TO CHECK BLOOD SUGAR TWICE DAILY 10/25/21  Yes [provider]  busPIRone (BUSPAR) 15 MG tablet Take 0.5 tablets (7.5 mg total) by mouth 2 (two) times daily. Patient taking differently: Take 7.5 mg by mouth daily at 12 noon. PRN 05/23/21  Yes Lesleigh Noe, MD  empagliflozin (JARDIANCE) 25 MG TABS tablet Take 1 tablet (25 mg total) by mouth daily before breakfast. 04/18/21  Yes Lesleigh Noe, MD  glucose blood (ACCU-CHEK AVIVA PLUS) test strip Use as instructed to test blood sugar 2 times daily E11.45 02/02/20  Yes Shamleffer, Melanie Crazier, MD  glucose blood test strip 1 each by Other route 2 (two) times daily. Use as instructed to check blood sugar 2 times daily with OneTouch meter. 10/25/21  Yes Lesleigh Noe, MD  Insulin Degludec (TRESIBA) 100 UNIT/ML SOLN Inject 10 Units into the skin daily with breakfast. 03/03/22  Yes Lesleigh Noe, MD  metFORMIN (GLUCOPHAGE-XR) 500 MG 24 hr tablet Take 2 tablets (1,000 mg total) by mouth daily with breakfast. (2 tablets) 10/22/21  Yes Lesleigh Noe, MD  NEEDLE, DISP, 30 G (BD DISP NEEDLES) 30G X 1/2" MISC Use as directed with insulin 03/03/22  Yes Lesleigh Noe, MD  omeprazole (PRILOSEC) 20 MG capsule Take 1 capsule (20 mg total) by mouth every other day. 11/18/21  Yes Lesleigh Noe, MD  pravastatin (PRAVACHOL) 20 MG tablet TAKE 1 TABLET BY MOUTH DAILY WITH EVENING MEAL 06/05/21  Yes Lesleigh Noe, MD    Allergies  Allergen Reactions   Niacin     REACTION: unbearable hot flashes   Sulfamethoxazole-Trimethoprim Itching    Gynecologic History: No LMP recorded. Patient is postmenopausal.  Obstetric History: No obstetric history on file.  Social History   Socioeconomic History   Marital status: Widowed    Spouse name: Not on file   Number of children: 2   Years of education: Master's degree   Highest  education level: Not on file  Occupational History    Comment: retired Pharmacist, hospital  Tobacco Use   Smoking status: Never   Smokeless tobacco: Never  Vaping Use   Vaping Use: Never used  Substance and Sexual Activity   Alcohol use: No    Alcohol/week: 0.0 standard drinks of alcohol   Drug use: No   Sexual activity: Not Currently  Other Topics Concern   Not on file  Social History Narrative   Lives with husband    caffeine - none   01/16/20   From: [place]    Living: alone   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) - has gone back some and may return parttime  Family: Scientist, physiological (mosiacism down's syndrome, lives in garage apartment) and Annie Main       Enjoys: pickle-ball prior to covid, traveling with sisters, house projects      Exercise: not currently - wants to get back to walking   Diet: trying to reduce portions, limit soda      Safety   Seat belts: Yes    Guns: No   Safe in relationships: Yes    Social Determinants of Health   Financial Resource Strain: Low Risk  (12/17/2021)   Overall Financial Resource Strain (CARDIA)    Difficulty of Paying Living Expenses: Not hard at all  Food Insecurity: No Food Insecurity (12/17/2021)   Hunger Vital Sign    Worried About Running Out of Food in the Last Year: Never true    [place] in the Last Year: Never true  Transportation Needs: No Transportation Needs (12/17/2021)   PRAPARE - Hydrologist (Medical): No    Lack of Transportation (Non-Medical): No  Physical Activity: Unknown (12/17/2021)   Exercise Vital Sign    Days of Exercise per Week: Patient refused    Minutes of Exercise per Session: Patient refused  Stress: No Stress Concern Present (12/17/2021)   [place]    Feeling of Stress : Not at all  Social Connections: Moderately Integrated (12/17/2021)   Social Connection and Isolation Panel [NHANES]     Frequency of Communication with Friends and Family: More than three times a week    Frequency of Social Gatherings with Friends and Family: More than three times a week    Attends Religious Services: More than 4 times per year    Active Member of [employer] or Organizations: Yes    Attends Archivist Meetings: More than 4 times per year    Marital Status: Widowed  Intimate Partner Violence: Not At Risk (12/17/2021)   Humiliation, Afraid, Rape, and Kick questionnaire    Fear of Current or Ex-Partner: No    Emotionally Abused: No    Physically Abused: No    Sexually Abused: No    Family History  Problem Relation Age of Onset   Diabetes Mother    Hypertension Mother    Hyperlipidemia Mother    Stroke Mother    Heart disease Mother    Heart disease Father    Diabetes Sister    Diabetes Sister    Breast cancer Sister    Parkinson's disease Brother    Heart disease Maternal Grandmother    Diabetes Maternal Grandfather    Heart attack Other    Colon cancer Neg Hx    Esophageal cancer Neg Hx     Review of Systems  Constitutional:  Negative for chills and fever.  HENT:  Negative for congestion and sore throat.   Eyes:  Negative for blurred vision and double vision.  Respiratory:  Negative for shortness of breath.   Cardiovascular:  Negative for chest pain.  Gastrointestinal:  Negative for heartburn, nausea and vomiting.  Genitourinary: Negative.   Musculoskeletal: Negative.  Negative for myalgias.  Skin:  Negative for rash.  Neurological:  Negative for dizziness and headaches.  Endo/Heme/Allergies:  Does not bruise/bleed easily.  Psychiatric/Behavioral:  Negative for depression. The patient is not nervous/anxious.      Physical Exam BP 120/70   Pulse 71   Temp (!) 96.9 F (36.1 C) (Temporal)   Ht 5' 4.5" (1.638 m)   Wt 126  lb 4 oz (57.3 kg)   SpO2 99%   BMI 21.34 kg/m    BP Readings from Last 3 Encounters:  04/14/22 120/70  01/24/22 100/70  01/23/22 124/68       Physical Exam Constitutional:      General: She is not in acute distress.    Appearance: She is well-developed. She is not diaphoretic.  HENT:     Head: Normocephalic and atraumatic.     Right Ear: External ear normal.     Left Ear: External ear normal.     Nose: Nose normal.  Eyes:     General: No scleral icterus.    Extraocular Movements: Extraocular movements intact.     Conjunctiva/sclera: Conjunctivae normal.  Cardiovascular:     Rate and Rhythm: Normal rate and regular rhythm.     Heart sounds: No murmur heard. Pulmonary:     Effort: Pulmonary effort is normal. No respiratory distress.     Breath sounds: Normal breath sounds. No wheezing.  Abdominal:     General: Bowel sounds are normal. There is no distension.     Palpations: Abdomen is soft. There is no mass.     Tenderness: There is no abdominal tenderness. There is no guarding or rebound.  Musculoskeletal:        General: Normal range of motion.     Cervical back: Neck supple.  Lymphadenopathy:     Cervical: No cervical adenopathy.  Skin:    General: Skin is warm and dry.     Capillary Refill: Capillary refill takes less than 2 seconds.  Neurological:     Mental Status: She is alert and oriented to person, place, and time.     Deep Tendon Reflexes: Reflexes normal.  Psychiatric:        Mood and Affect: Mood normal.        Behavior: Behavior normal.     Results:  PHQ-9:  [place] Office Visit from 05/23/2021 in [place] at [place]  PHQ-9 Total Score 4         Assessment: 76 y.o. No obstetric history on file. female here for routine annual physical examination.  Plan: Problem List Items Addressed This Visit       Endocrine   Hyperlipidemia associated with type 2 diabetes mellitus ([place])   Relevant Orders   Lipid panel   Type 2 diabetes mellitus with stage 3a chronic kidney disease, without long-term current use of insulin ([place])    Return for labs in 2 weeks. Cont  jardiance, trulicity, metformin, insulin      Relevant Orders   Hemoglobin A1c     Genitourinary   CKD (chronic kidney disease) stage 3, GFR 30-59 ml/min ([facility])   Relevant Orders   Basic metabolic panel   Other Visit Diagnoses     Annual physical exam    -  Primary   Tuberculosis screening       Relevant Orders   TB Skin Test (Completed)       Screening: -- Blood pressure screen normal -- cholesterol screening: will obtain -- Weight screening: normal -- Diabetes Screening: not due for screening -- Nutrition: Encouraged healthy diet  The 10-year ASCVD risk score (Arnett DK, et al., 2019) is: 23.3%   Values used to calculate the score:     Age: 77 years     Sex: Female     Is Non-Hispanic African American: No     Diabetic: Yes     Tobacco smoker: No  Systolic Blood Pressure: 161 mmHg     Is BP treated: No     HDL Cholesterol: 39.3 mg/dL     Total Cholesterol: 181 mg/dL  -- Statin therapy for Age 23-75 with CVD risk >7.5%  Psych -- Depression screening (PHQ-9):  [place] Visit from 05/23/2021 in [place] at [place]  PHQ-9 Total Score 4        Safety -- tobacco screening: not using -- alcohol screening:  low-risk usage. -- no evidence of domestic violence or intimate partner violence.   Cancer Screening -- pap smear not collected per ASCCP guidelines -- family history of breast cancer screening: done. not at high risk. -- Mammogram -  due in november -- Colon cancer (age 90+)--  up to date  Immunizations Immunization History  Administered Date(s) Administered   Influenza Split 06/03/2012   Influenza,inj,Quad PF,6+ Mos 08/10/2013, 05/03/2014, 05/06/2017, 07/15/2018, 05/18/2019   PFIZER(Purple Top)SARS-COV-2 Vaccination 09/18/2019, 10/11/2019   PPD Test 04/19/2013, 04/14/2022   Pneumococcal Conjugate-13 10/02/2014   Pneumococcal Polysaccharide-23 04/25/2013   Td 08/19/2007    -- flu vaccine not up to date - she will get  later -- TDAP q10 years not up to date - advised getting at pharmacy -- Shingles (age >30) not up to date - pharmacy advised getting -- PPSV-23 (19-64 with chronic disease or smoking) up to date -- PCV-13 (age >70) - one dose followed by PPSV-23 1 year later up to date -- Covid-19 Vaccine up to date   Encouraged healthy diet and exercise. Encouraged regular vision and dental care.    Lesleigh Noe, MD

## 2022-04-14 NOTE — Patient Instructions (Addendum)
Will need to get TDAP at the pharmacy   Check with school system and insurance on Hepatitis A/B  Lab appointment  Dr Diona Browner  - ask if you can schedule  Dr. Volanda Napoleon [facility]

## 2022-04-17 ENCOUNTER — Ambulatory Visit (INDEPENDENT_AMBULATORY_CARE_PROVIDER_SITE_OTHER): Payer: Medicare PPO | Admitting: Pharmacist

## 2022-04-17 DIAGNOSIS — E1169 Type 2 diabetes mellitus with other specified complication: Secondary | ICD-10-CM

## 2022-04-17 DIAGNOSIS — N1832 Chronic kidney disease, stage 3b: Secondary | ICD-10-CM

## 2022-04-17 DIAGNOSIS — E1122 Type 2 diabetes mellitus with diabetic chronic kidney disease: Secondary | ICD-10-CM

## 2022-04-17 LAB — TB SKIN TEST
Induration: 0 mm
TB Skin Test: NEGATIVE

## 2022-04-17 NOTE — Progress Notes (Signed)
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 year old female who is a primary patient of Cody, Jobe Marker, MD.  The CCM team was consulted for assistance with disease management and care coordination needs.    Engaged with patient by telephone for follow up visit in response to provider referral for pharmacy case management and/or care coordination services.   Consent to Services:  The patient was given information about Chronic Care Management services, agreed to services, and gave verbal consent prior to initiation of services.  Please see initial visit note for detailed documentation.   Patient Care Team: Lesleigh Noe, MD as PCP - General (Family Medicine) Katy Apo, MD as Consulting Physician (Ophthalmology) [facility], Melanie Crazier, MD as Consulting Physician (Endocrinology) Willia Craze, NP as Nurse Practitioner (Gastroenterology) Charlton Haws, [facility] as Pharmacist (Pharmacist)  Recent office visits: 04/14/22 Dr Einar Pheasant OV: annual -   03/03/22 TE - start Tyler Aas, stop trulicity.  01/24/22 Dr Einar Pheasant OV: f/u DM - A1c 9.7%; increase Trulicity to 3 mg. F/u 3 months. Encouraged Buspar BID as prescribed. Referred for therapy. Pt lost 8-10 lbs. Declined nutrition referral.  05/23/2021 - [name], MD - Patient presented for follow up for diabetes. Continue Buspar 7.5 mg BID  due to significant improvement. Change: Decrease Lisinopril 10 > 82m due to orthostatic hypotension.   04/18/2021 - [name] MD - Patient presented for diabetes follow up. Referral to Gastroenterology due to nausea and difficulty swallowing. Change: switch glipizide 5 mg bid to glipizide XL 10 mg daily. Change: Increase jardiance to 25 mg. Change: Increase buspar 3.75 > 7.5 mg and to 15 mg daily if no improvement after 2 weeks for anxiety.   Recent consult visits: 01/23/22 PA Amy Esterwood (GI): weight loss - 6-7 mo hx of diarrhea. Decrease in appetite 3-4 mos. Asked pt to stop Trulicity - 1 month trial off.  07/23/21 Dr SCandiss Norse(Nephrology): f/u CKD. Stable. No changes. F/U 1 year.  04/02/2021 - SJohny Drilling RN - Nutrition - Patient presented for diabetes. Recommended: Check blood sugars 1 x day before breakfast or 2 hrs after supper every day.   02/25/2021 - TCasimiro Needle RN - Urology - Telephone - Start: cephalexin ([facility] 500 MG capsule for 5 days.   02/20/2021 - CMaryland Pink- Urology - Patient presented for urinary frequency and prolapse of vaginal wall. No other information provided.   02/19/2021 - SJohny Drilling RN - Nutrition - Patient presented for diabetes. Recommended: Eat 3 meals day, 1-2  snacks a day, space meals 4-6 hours apart, don't skip meals and limit fried foods, desserts and sweets. Complete 3 Day Food Record and bring to next appt.   Hospital visits: None in previous 6 months   Objective:  Lab Results  Component Value Date   CREATININE 1.10 01/02/2022   BUN 20 01/02/2022   GFR 49.62 (L) 01/02/2022  GFRNONAA 57 (L) 06/23/2014   GFRAA 67 (L) 06/23/2014   NA 142 01/02/2022   K 5.2 No hemolysis seen (H) 01/02/2022   CALCIUM 10.0 01/02/2022   CO2 27 01/02/2022   GLUCOSE 172 (H) 01/02/2022    Lab Results  Component Value Date/Time   HGBA1C 8.5 (A) 01/24/2022 10:26 AM   HGBA1C 9.6 (A) 10/11/2021 10:49 AM   HGBA1C 9.3 (H) 07/06/2020 02:33 PM    HGBA1C 8.0 (H) 08/23/2019 02:36 PM   HGBA1C 7.7 08/25/2018 10:28 AM   GFR 49.62 (L) 01/02/2022 10:04 AM   GFR 61.34 07/06/2020 02:33 PM   MICROALBUR 1.3 10/02/2014 03:01 PM   MICROALBUR 1.9 08/05/2013 09:20 AM    Last diabetic Eye exam:  Lab Results  Component Value Date/Time   HMDIABEYEEXA No Retinopathy 11/01/2021 12:00 AM    Last diabetic Foot exam: No results found for: "HMDIABFOOTEX"   Lab Results  Component Value Date   CHOL 181 07/06/2020   HDL 39.30 07/06/2020   LDLCALC 84 05/05/2017   LDLDIRECT 96.0 07/06/2020   TRIG 279.0 (H) 07/06/2020   CHOLHDL 5 07/06/2020       Latest Ref Rng & Units 01/02/2022   10:04 AM 07/16/2021   12:00 AM 07/06/2020    2:33 PM  Hepatic Function  Total Protein 6.0 - 8.3 g/dL 7.3   7.4   Albumin 3.5 - 5.2 g/dL 4.2  4.3     4.2   AST 0 - 37 U/L 18   32   ALT 0 - 35 U/L 17   34   Alk Phosphatase 39 - 117 U/L 90   90   Total Bilirubin 0.2 - 1.2 mg/dL 0.5   0.4      This result is from an external source.    Lab Results  Component Value Date/Time   TSH 3.43 01/02/2022 10:04 AM   TSH 3.57 08/23/2019 02:36 PM       Latest Ref Rng & Units 01/02/2022   10:04 AM 07/16/2021   12:00 AM 07/06/2020    2:33 PM  CBC  WBC 4.0 - 10.5 K/uL 6.8  7.2     7.8   Hemoglobin 12.0 - 15.0 g/dL 13.0  13.2     12.8   Hematocrit 36.0 - 46.0 % 40.4  40     38.8   Platelets 150.0 - 400.0 K/uL 274.0  283     291.0      This result is from an external source.    Lab Results  Component Value Date/Time   VD25OH 21.70 (L) 08/23/2019 02:36 PM    Clinical ASCVD: No  The 10-year ASCVD risk score (Arnett DK, et al., 2019) is: 23.3%   Values used to calculate the score:     Age: 76 years     Sex: Female     Is Non-Hispanic African American: No     Diabetic: Yes     Tobacco smoker: No     Systolic Blood Pressure: 481 mmHg     Is BP treated: No     HDL Cholesterol: 39.3 mg/dL     Total Cholesterol: 181 mg/dL       12/17/2021    2:51 PM 05/23/2021    12:52 PM 02/19/2021    2:04 PM  Depression screen PHQ 2/9  Decreased Interest 0 1 0  Down, Depressed, Hopeless 0 0 0  PHQ - 2 Score 0 1 0  Altered sleeping  1   Tired, decreased energy  1   Change in appetite  1   Feeling bad or failure about yourself   0   Trouble concentrating  0   Moving slowly or fidgety/restless  0   Suicidal thoughts  0   PHQ-9 Score  4   Difficult doing work/chores  Not difficult at all      Social History   Tobacco Use  Smoking Status Never  Smokeless Tobacco Never   BP Readings from Last 3 Encounters:  04/14/22 120/70  01/24/22 100/70  01/23/22 124/68   Pulse Readings from Last 3 Encounters:  04/14/22 71  01/24/22 83  01/23/22 60   Wt Readings from Last 3 Encounters:  04/14/22 126 lb 4 oz (57.3 kg)  01/24/22 128 lb 5 oz (58.2 kg)  01/23/22 130 lb 9.6 oz (59.2 kg)   BMI Readings from Last 3 Encounters:  04/14/22 21.34 kg/m  01/24/22 22.02 kg/m  01/23/22 22.42 kg/m    Assessment/Interventions: Review of patient past medical history, allergies, medications, health status, including review of consultants reports, laboratory and other test data, was performed as part of comprehensive evaluation and provision of chronic care management services.   SDOH:  (Social Determinants of Health) assessments and interventions performed: Yes SDOH Interventions    Flowsheet Row Clinical Support from 12/17/2021 in [place] at [facility] Management from 07/19/2021 in [place] at [facility] Visit from 04/18/2021 in [place] at [place] Interventions Intervention Not Indicated -- --  Housing Interventions Intervention Not Indicated -- --  Transportation Interventions Intervention Not Indicated -- --  Depression Interventions/Treatment  -- -- Medication  Financial Strain Interventions Intervention Not Indicated Intervention Not Indicated --  Physical Activity  Interventions Intervention Not Indicated -- --  Stress Interventions Intervention Not Indicated -- --  Social Connections Interventions Intervention Not Indicated -- --       SDOH Screenings   Food Insecurity: No Food Insecurity (12/17/2021)  Housing: Low Risk  (12/17/2021)  Transportation Needs: No Transportation Needs (12/17/2021)  Alcohol Screen: Low Risk  (12/17/2021)  Depression (PHQ2-9): Low Risk  (12/17/2021)  Financial Resource Strain: Low Risk  (12/17/2021)  Physical Activity: Unknown (12/17/2021)  Social Connections: Moderately Integrated (12/17/2021)  Stress: No Stress Concern Present (12/17/2021)  Tobacco Use: Low Risk  (01/23/2022)    CCM Care Plan  Allergies  Allergen Reactions   Niacin     REACTION: unbearable hot flashes   Sulfamethoxazole-Trimethoprim Itching    Medications Reviewed Today     Reviewed by Loreen Freud, CMA (Certified Medical Assistant) on 04/14/22 at [phone]  Med List Status: <None>   Medication Order Taking? Sig Documenting Provider Last Dose Status Informant  Accu-Chek Softclix Lancets lancets [id number] Yes 2 (two) times daily. [provider] Taking Active   acetaminophen (TYLENOL) 325 MG tablet [id number] Yes Take 2 tablets by mouth as needed. [provider] Taking Active   Blood Glucose Monitoring Suppl (ACCU-CHEK GUIDE ME) w/Device KIT [id number] Yes USE AS DIRECTED TO CHECK BLOOD SUGAR TWICE DAILY [provider] Taking Active   busPIRone (BUSPAR) 15 MG tablet [id number] Yes Take 0.5 tablets (7.5 mg total) by mouth 2 (two) times daily.  Patient taking differently: Take 7.5 mg by mouth daily at 12 noon. PRN   Lesleigh Noe, MD Taking Active   empagliflozin (JARDIANCE) 25 MG TABS tablet [id number] Yes Take 1 tablet (25 mg total) by mouth daily before breakfast. Lesleigh Noe, MD Taking  Active   glucose blood (ACCU-CHEK AVIVA PLUS) test strip [id number] Yes Use as instructed to test blood sugar 2 times daily E11.45  Shamleffer, Melanie Crazier, MD Taking Active   glucose blood test strip [id number] Yes 1 each by Other route 2 (two) times daily. Use as instructed to check blood sugar 2 times daily with OneTouch meter. Lesleigh Noe, MD Taking Active   Insulin Degludec (TRESIBA) 100 UNIT/ML SOLN [id number] Yes Inject 10 Units into the skin daily with breakfast. Lesleigh Noe, MD Taking Active   metFORMIN (GLUCOPHAGE-XR) 500 MG 24 hr tablet [id number] Yes Take 2 tablets (1,000 mg total) by mouth daily with breakfast. (2 tablets) Lesleigh Noe, MD Taking Active   NEEDLE, DISP, 30 G (BD DISP NEEDLES) 30G X 1/2" MISC [id number] Yes Use as directed with insulin Lesleigh Noe, MD Taking Active   omeprazole (PRILOSEC) 20 MG capsule [id number] Yes Take 1 capsule (20 mg total) by mouth every other day. Lesleigh Noe, MD Taking Active   pravastatin (PRAVACHOL) 20 MG tablet [id number] Yes TAKE 1 TABLET BY MOUTH DAILY WITH EVENING MEAL Lesleigh Noe, MD Taking Active             Patient Active Problem List   Diagnosis Date Noted   B12 deficiency 01/24/2022   Weight loss 01/24/2022   Other fatigue 01/02/2022   Postural dizziness 05/23/2021   Generalized anxiety disorder 04/18/2021   Muscle spasm 04/18/2021   Diarrhea 01/08/2021   Osteopenia 01/01/2021   Thumb pain, left 08/21/2020   Shortness of breath 07/09/2020   Tinnitus of both ears 07/09/2020   Type 2 diabetes mellitus with hyperglycemia, without long-term current use of insulin ([place]) 10/03/2019   Type 2 diabetes mellitus with stage 3a chronic kidney disease, without long-term current use of insulin ([place]) 10/03/2019   Disease related peripheral neuropathy 08/22/2019   Urinary frequency 05/18/2019   Stress incontinence of urine 02/21/2019   Grief 07/15/2018   Pelvic prolapse 01/22/2017   Hyperlipidemia associated with type 2 diabetes mellitus ([place]) 05/03/2014   GERD (gastroesophageal reflux disease) 12/05/2013   CKD (chronic kidney disease) stage  3, GFR 30-59 ml/min ([place]) 11/27/2011   HYPERKALEMIA 11/30/2009   INSOMNIA 11/21/2009   FATIGUE 11/21/2009   Diabetes mellitus with renal manifestation ([place]) 07/25/2009   Essential hypertension 07/25/2009   COLONIC POLYPS, HX OF 07/25/2009    Immunization History  Administered Date(s) Administered   Influenza Split 06/03/2012   Influenza,inj,Quad PF,6+ Mos 08/10/2013, 05/03/2014, 05/06/2017, 07/15/2018, 05/18/2019   PFIZER(Purple Top)SARS-COV-2 Vaccination 09/18/2019, 10/11/2019   PPD Test 04/19/2013, 04/14/2022   Pneumococcal Conjugate-13 10/02/2014   Pneumococcal Polysaccharide-23 04/25/2013   Td 08/19/2007    Conditions to be addressed/monitored:  Hypertension, Hyperlipidemia, Diabetes, GERD, Anxiety, and Osteopenia  Care Plan : [place]  Updates made by Charlton Haws, [place] since 04/24/2022 12:00 AM     Problem: Hypertension, Hyperlipidemia, Diabetes, GERD, Anxiety, and Osteopenia      Long-Range Goal: Disease mgmt   Start Date: 07/19/2021  Expected End Date: 07/19/2022  This Visit's Progress: On track  Recent Progress: On track  Priority: High  Note:   Current Barriers:  Unable to achieve control of Diabetes  Struggles to adhere to prescribed medication regimen  Pharmacist Clinical Goal(s):  Patient will achieve improvement in diabetes as evidenced by A1c < 8% adhere to prescribed medication regimen as evidenced by pt report through collaboration with PharmD and provider.   Interventions: 1:1 collaboration with Lesleigh Noe, MD  regarding development and update of comprehensive plan of care as evidenced by provider attestation and co-signature Inter-disciplinary care team collaboration (see longitudinal plan of care) Comprehensive medication review performed; medication list updated in electronic medical record  Hypertension (BP goal <130/80) -Controlled - BP fluctuates at home; pt is off medication now; Pt reports drinking plenty of water  lately ("more water than I've ever drank in my life") -Home BP: 95/59 (standing), 142/73 (sitting) -Current treatment: None -Medications previously tried: lisinopril (low BP) -Educated on BP goals and benefits of medications for prevention of heart attack, stroke and kidney damage; Importance of home blood pressure monitoring; -Counseled to monitor BP at home weekly -Recommended to continue current medication  Hyperlipidemia: (LDL goal < 100) -Query controlled - LDL 96 (07/2020), overdue for repeat lipid panel; pt endorses compliance with statin -Current treatment: Pravastatin 20 mg daily - Appropriate, Effective, Safe, Accessible -Educated on Cholesterol goals; Benefits of statin for ASCVD risk reduction; -Recommended to continue current medication; repeat lipid panel at annual visit  Diabetes (A1c goal <8%) -Uncontrolled - A1c 0.1% (02/5101) on Trulicity 1.5 mg; she has since been switched to [place] due to side effects with Trulicity -Previously metformin was reduced to 1000 mg/day due to diarrhea, which improved with lower dose -Dexcom training completed 03/17/22 -Reviewed AGP report:  Date of Birth: 10-Jun-1946 Generated at: Thu, Apr 17, 2022 3:38 PM EDT Reporting period: Fri Apr 04, 2022 - Thu Apr 17, 2022 ----------------------------- Glucose Details Average glucose: 204 mg/dL Standard deviation: 57 mg/dL GMI: 8.2% ----------------------------- Time in Range Very High: 23% High: 40% In Range: 37% Low: 0% Very Low: 0%  Target Range:70-180 mg/dL ----------------------------- CGM Details Sensor usage: 79% Days with CGM data: 11/14  -Previous AGP report: Generated at: Tue, Apr 01, 2022 3:12 PM EDT Reporting period: Sat Mar 15, 2022 - Fri Mar 28, 2022 ----------------------------- Glucose Details Average glucose: 186 mg/dL Standard deviation: 47 mg/dL GMI: 7.7% ----------------------------- Time in Range Very High: 9% High: 43% In Range: 48% Low: 0% Very Low:  0%  Target Range:70-180 mg/dL ----------------------------- CGM Details Sensor usage: 71% Days with CGM data: 10/14  -Current medications: Jardiance 25 mg daily - Appropriate, Query Effective Metformin ER 500 mg -2 tablet daily -Appropriate, Query Effective Tresiba 10 units daily -Appropriate, Query Effective Dexcom G7 - Appropriate, Effective, Safe, Accessible -Medications previously tried: Januvia, Rybelsus, glipizide (10 years of tx), Trulicity (GI/wt loss) -Educated on A1c and blood sugar goals; Complications of diabetes including kidney damage, retinal damage, and cardiovascular disease; -Reviewed AGP report with patient; post-prandial sugar continues to be elevated, 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 Recommend 1200 mg of calcium daily from dietary and supplemental sources. Recommend weight-bearing and muscle strengthening exercises for building and maintaining bone density.  Health Maintenance -Vaccine gaps: Flu, covid booster, Shingrix, TD booster  Patient Goals/Self-Care Activities Patient will:  - take medications as prescribed as evidenced by patient report and record review focus on medication adherence by routine heck blood pressure weekly, document,  and provide at future appointments check blood sugar daily, document, and provide at future appointments      Medication Assistance: None required.  Patient  affirms current coverage meets needs.  Compliance/Adherence/Medication fill history: Care Gaps: Cologuard (due 04/26/21) AWV due  Star-Rating Drugs: Pravastatin - PDC 52% (LF 03/27/22 x 90 ds) Metformin - PDC 84% (LF 03/27/22 x 30 ds) Jardiance - PDC 82% (LF 01/03/22 x 90 ds)  Medication Access: Within the past 30 days, how often has patient missed a dose of medication? 0 Is a pillbox or other method used to improve adherence? No  Factors that may affect medication adherence? adverse effects of medications Are meds synced by current pharmacy? No  Are meds delivered by current pharmacy? No  Does patient experience delays in picking up medications due to transportation concerns? No   [facility] Services Reviewed: Is patient disadvantaged to use [facility]?: No  Current Rx insurance plan: [employer] [place] Name and location of Current pharmacy:  [address], [address] [place] [address] Phone: [phone] Fax: [phone]  [facility] services reviewed with patient today?: No  Patient requests to transfer care to [facility]?: No  Reason patient declined to change pharmacies: Loyalty to other pharmacy/Patient preference   Care Plan and Follow Up Patient Decision:  Patient agrees to Care Plan and Follow-up.  Plan: Telephone follow up appointment with care management team member scheduled for:  2 weeks  Charlene Brooke, PharmD, [facility] Clinical Pharmacist [facility] at [facility] [phone]

## 2022-04-24 NOTE — Patient Instructions (Signed)
Visit Information  Phone number for Pharmacist: [phone]   Goals Addressed   None     Care Plan : [place]  Updates made by Charlton Haws, RPH since 04/24/2022 12:00 AM     Problem: Hypertension, Hyperlipidemia, Diabetes, GERD, Anxiety, and Osteopenia      Long-Range Goal: Disease mgmt   Start Date: 07/19/2021  Expected End Date: 07/19/2022  This Visit's Progress: On track  Recent Progress: On track  Priority: High  Note:   Current Barriers:  Unable to achieve control of Diabetes  Struggles to adhere to prescribed medication regimen  Pharmacist Clinical Goal(s):  Patient will achieve improvement in diabetes as evidenced by A1c < 8% adhere to prescribed medication regimen as evidenced by pt report through collaboration with PharmD and provider.   Interventions: 1:1 collaboration with Lesleigh Noe, MD regarding development and update of comprehensive plan of care as evidenced by provider attestation and co-signature Inter-disciplinary care team collaboration (see longitudinal plan of care) Comprehensive medication review performed; medication list updated in electronic medical record  Hypertension (BP goal <130/80) -Controlled - BP fluctuates at home; pt is off medication now; Pt reports drinking plenty of water lately ("more water than I've ever drank in my life") -Home BP: 95/59 (standing), 142/73 (sitting) -Current treatment: None -Medications previously tried: lisinopril (low BP) -Educated on BP goals and benefits of medications for prevention of heart attack, stroke and kidney damage; Importance of home blood pressure monitoring; -Counseled to monitor BP at home weekly -Recommended to continue current medication  Hyperlipidemia: (LDL goal < 100) -Query controlled - LDL 96 (07/2020), overdue for repeat lipid panel; pt endorses compliance with statin -Current treatment: Pravastatin 20 mg daily - Appropriate, Effective, Safe,  Accessible -Educated on Cholesterol goals; Benefits of statin for ASCVD risk reduction; -Recommended to continue current medication; repeat lipid panel at annual visit  Diabetes (A1c goal <8%) -Uncontrolled - A1c 9.7% (04/8920) on Trulicity 1.5 mg; she has since been switched to [place] due to side effects with Trulicity -Previously metformin was reduced to 1000 mg/day due to diarrhea, which improved with lower dose -Dexcom training completed 03/17/22 -Reviewed AGP report:  Date of Birth: [birth year] Generated at: Thu, Apr 17, 2022 3:38 PM EDT Reporting period: Fri Apr 04, 2022 - Thu Apr 17, 2022 ----------------------------- Glucose Details Average glucose: 204 mg/dL Standard deviation: 57 mg/dL GMI: 8.2% ----------------------------- Time in Range Very High: 23% High: 40% In Range: 37% Low: 0% Very Low: 0%  Target Range:70-180 mg/dL ----------------------------- CGM Details Sensor usage: 79% Days with CGM data: 11/14  -Previous AGP report: Generated at: Tue, Apr 01, 2022 3:12 PM EDT Reporting period: Sat Mar 15, 2022 - Fri Mar 28, 2022 ----------------------------- Glucose Details Average glucose: 186 mg/dL Standard deviation: 47 mg/dL GMI: 7.7% ----------------------------- Time in Range Very High: 9% High: 43% In Range: 48% Low: 0% Very Low: 0%  Target Range:70-180 mg/dL ----------------------------- CGM Details Sensor usage: 71% Days with CGM data: 10/14  -Current medications: Jardiance 25 mg daily - Appropriate, Query Effective Metformin ER 500 mg -2 tablet daily -Appropriate, Query Effective Tresiba 10 units daily -Appropriate, Query Effective Dexcom G7 - Appropriate, Effective, Safe, Accessible -Medications previously tried: Januvia, Rybelsus, glipizide (10 years of tx), Trulicity (GI/wt loss) -Educated on A1c and blood sugar goals; Complications of diabetes including kidney damage, retinal damage, and cardiovascular disease; -Reviewed AGP report with  patient; post-prandial sugar continues to be elevated, 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 Recommend 1200 mg of calcium daily from dietary and supplemental sources. Recommend weight-bearing and muscle strengthening exercises for building and maintaining bone density.  Health Maintenance -Vaccine gaps: Flu, covid booster, Shingrix, TD booster  Patient Goals/Self-Care Activities Patient will:  - take medications as prescribed as evidenced by patient report and record review focus on medication adherence by routine heck blood pressure weekly, document, and provide at future appointments check blood sugar daily, document, and provide at future appointments      Patient verbalizes understanding of instructions and care plan provided today and agrees to view in [place]. Active MyChart status and patient understanding of how to access instructions and care plan via MyChart confirmed with patient.    Telephone follow up appointment with pharmacy team member scheduled for: 2 weeks  Charlene Brooke, PharmD, [facility] Clinical Pharmacist [facility] [phone]

## 2022-04-28 ENCOUNTER — Other Ambulatory Visit (INDEPENDENT_AMBULATORY_CARE_PROVIDER_SITE_OTHER): Payer: Medicare PPO

## 2022-04-28 DIAGNOSIS — N1831 Chronic kidney disease, stage 3a: Secondary | ICD-10-CM

## 2022-04-28 DIAGNOSIS — E1169 Type 2 diabetes mellitus with other specified complication: Secondary | ICD-10-CM | POA: Diagnosis not present

## 2022-04-28 DIAGNOSIS — E1122 Type 2 diabetes mellitus with diabetic chronic kidney disease: Secondary | ICD-10-CM

## 2022-04-28 DIAGNOSIS — N1832 Chronic kidney disease, stage 3b: Secondary | ICD-10-CM | POA: Diagnosis not present

## 2022-04-28 DIAGNOSIS — E785 Hyperlipidemia, unspecified: Secondary | ICD-10-CM | POA: Diagnosis not present

## 2022-04-28 LAB — BASIC METABOLIC PANEL
BUN: 22 mg/dL (ref 6–23)
CO2: 25 mEq/L (ref 19–32)
Calcium: 9.2 mg/dL (ref 8.4–10.5)
Chloride: 107 mEq/L (ref 96–112)
Creatinine, Ser: 1 mg/dL (ref 0.40–1.20)
GFR: 55.51 mL/min — ABNORMAL LOW (ref >60.00)
Glucose, Bld: 130 mg/dL — ABNORMAL HIGH (ref 70–99)
Potassium: 4.1 mEq/L (ref 3.5–5.1)
Sodium: 143 mEq/L (ref 135–145)

## 2022-04-28 LAB — LIPID PANEL
Cholesterol: 139 mg/dL (ref 0–200)
HDL: 43.7 mg/dL (ref >39.00)
LDL Cholesterol: 75 mg/dL (ref 0–99)
NonHDL: 95.17
Total CHOL/HDL Ratio: 3
Triglycerides: 100 mg/dL (ref 0.0–149.0)
VLDL: 20 mg/dL (ref 0.0–40.0)

## 2022-04-28 LAB — HEMOGLOBIN A1C: Hgb A1c MFr Bld: 8.5 % — ABNORMAL HIGH (ref 4.6–6.5)

## 2022-04-29 ENCOUNTER — Ambulatory Visit: Payer: Medicare PPO | Admitting: Family Medicine

## 2022-04-29 ENCOUNTER — Ambulatory Visit: Payer: Medicare PPO | Admitting: Pharmacist

## 2022-04-29 DIAGNOSIS — E1122 Type 2 diabetes mellitus with diabetic chronic kidney disease: Secondary | ICD-10-CM

## 2022-04-29 DIAGNOSIS — E1169 Type 2 diabetes mellitus with other specified complication: Secondary | ICD-10-CM

## 2022-04-29 NOTE — Progress Notes (Signed)
Chronic Care Management Pharmacy Note  04/29/22 Name:  Wanda [name] MRN:  [id number] DOB:  Apr 08, 1946  Summary: CCM F/U visit -DM: A1c 8.5% (04/2022); pt is compliant with insulin and is wearing Dexcom  Reviewed AGP report: 04/16/22 to 04/29/22. Sensor active: 93%  Time in range (70-180): 47% (goal > 70%)  High (>180): 53%   Very high (>250): 20%  Low (< 70): 0% (goal < 4%)  GMI: 8.0%; Average glucose: 195 -Compared to previous Dexcom report, sugar control is somewhat improved but patient still has sugar readings > 250 about 20% of the time. -Fasting glucose is also at goal (90-130 most days), so further titrating basal insulin may cause hypoglycemia without helping high post-prandial sugars -Medication choices limited - tried/failed Trulicity, Januvia, glipizide (x 10 yrs); to target post-prandial highs, bolus insulin or meglitinides (Prandin) are options, though Prandin would require beta-cell function for efficacy and hers is questionable at this point -Reviewed diet choices to improve post-prandial spikes (discussed snacks < 15 g carbs). Pt reports she is going to grocery store today and will make concerted effort to choose more carb-conscious foods  Recommendations/Changes made from today's visit: -No med changes yet; pt wants another chance to focus on diet; she has PCP appt next week   Plan: -Pharmacist follow up televisit scheduled for 4 weeks -PCP F/U 05/05/22    Subjective: Wanda [name] is an 76 y.o. year old female who is a primary patient of Cody, Jobe Marker, MD.  The CCM team was consulted for assistance with disease management and care coordination needs.    Engaged with patient by telephone for follow up visit in response to provider referral for pharmacy case management and/or care coordination services.   Consent to Services:  The patient was given information about Chronic Care Management services, agreed to services, and gave verbal consent prior to initiation of  services.  Please see initial visit note for detailed documentation.   Patient Care Team: Lesleigh Noe, MD as PCP - General (Family Medicine) Katy Apo, MD as Consulting Physician (Ophthalmology) [facility], Melanie Crazier, MD as Consulting Physician (Endocrinology) Willia Craze, NP as Nurse Practitioner (Gastroenterology) Charlton Haws, [facility] as Pharmacist (Pharmacist)  Recent office visits: 04/14/22 Dr Einar Pheasant OV: annual - A1c 8.5%;   03/03/22 TE - start Tyler Aas, stop trulicity.  01/24/22 Dr Einar Pheasant OV: f/u DM - A1c 4.6%; increase Trulicity to 3 mg. F/u 3 months. Encouraged Buspar BID as prescribed. Referred for therapy. Pt lost 8-10 lbs. Declined nutrition referral.  05/23/2021 - [name], MD - Patient presented for follow up for diabetes. Continue Buspar 7.5 mg BID due to significant improvement. Change: Decrease Lisinopril 10 > 41m due to orthostatic hypotension.   04/18/2021 - [name] MD - Patient presented for diabetes follow up. Referral to Gastroenterology due to nausea and difficulty swallowing. Change: switch glipizide 5 mg bid to glipizide XL 10 mg daily. Change: Increase jardiance to 25 mg. Change: Increase buspar 3.75 > 7.5 mg and to 15 mg daily if no improvement after 2 weeks for anxiety.   Recent consult visits: 01/23/22 PA Amy Esterwood (GI): weight loss - 6-7 mo hx of diarrhea. Decrease in appetite 3-4 mos. Asked pt to stop Trulicity - 1 month trial off.  07/23/21 Dr SCandiss Norse(Nephrology): f/u CKD. Stable. No changes. F/U 1 year.  04/02/2021 - SJohny Drilling RN - Nutrition - Patient presented for diabetes. Recommended: Check blood sugars 1 x day before breakfast or 2 hrs after supper every day.  02/25/2021 - Casimiro Needle, RN - Urology - Telephone - Start: cephalexin [facility]) 500 MG capsule for 5 days.   02/20/2021 - [place] Pink - Urology - Patient presented for urinary frequency and prolapse of vaginal wall. No other information provided.    02/19/2021 - Johny Drilling, RN - Nutrition - Patient presented for diabetes. Recommended: Eat 3 meals day, 1-2  snacks a day, space meals 4-6 hours apart, don't skip meals and limit fried foods, desserts and sweets. Complete 3 Day Food Record and bring to next appt.   Hospital visits: None in previous 6 months   Objective:  Lab Results  Component Value Date   CREATININE 1.00 04/28/2022   BUN 22 04/28/2022   GFR 55.51 (L) 04/28/2022   GFRNONAA 57 (L) 06/23/2014   GFRAA 67 (L) 06/23/2014   NA 143 04/28/2022   K 4.1 04/28/2022   CALCIUM 9.2 04/28/2022   CO2 25 04/28/2022   GLUCOSE 130 (H) 04/28/2022    Lab Results  Component Value Date/Time   HGBA1C 8.5 (H) 04/28/2022 09:09 AM   HGBA1C 8.5 (A) 01/24/2022 10:26 AM   HGBA1C 9.6 (A) 10/11/2021 10:49 AM   HGBA1C 9.3 (H) 07/06/2020 02:33 PM   HGBA1C 7.7 08/25/2018 10:28 AM   GFR 55.51 (L) 04/28/2022 09:09 AM   GFR 49.62 (L) 01/02/2022 10:04 AM   MICROALBUR 1.3 10/02/2014 03:01 PM   MICROALBUR 1.9 08/05/2013 09:20 AM    Last diabetic Eye exam:  Lab Results  Component Value Date/Time   HMDIABEYEEXA No Retinopathy 11/01/2021 12:00 AM    Last diabetic Foot exam: No results found for: "HMDIABFOOTEX"   Lab Results  Component Value Date   CHOL 139 04/28/2022   HDL 43.70 04/28/2022   LDLCALC 75 04/28/2022   LDLDIRECT 96.0 07/06/2020   TRIG 100.0 04/28/2022   CHOLHDL 3 04/28/2022       Latest Ref Rng & Units 01/02/2022   10:04 AM 07/16/2021   12:00 AM 07/06/2020    2:33 PM  Hepatic Function  Total Protein 6.0 - 8.3 g/dL 7.3   7.4   Albumin 3.5 - 5.2 g/dL 4.2  4.3     4.2   AST 0 - 37 U/L 18   32   ALT 0 - 35 U/L 17   34   Alk Phosphatase 39 - 117 U/L 90   90   Total Bilirubin 0.2 - 1.2 mg/dL 0.5   0.4      This result is from an external source.    Lab Results  Component Value Date/Time   TSH 3.43 01/02/2022 10:04 AM   TSH 3.57 08/23/2019 02:36 PM       Latest Ref Rng & Units 01/02/2022   10:04 AM 07/16/2021    12:00 AM 07/06/2020    2:33 PM  CBC  WBC 4.0 - 10.5 K/uL 6.8  7.2     7.8   Hemoglobin 12.0 - 15.0 g/dL 13.0  13.2     12.8   Hematocrit 36.0 - 46.0 % 40.4  40     38.8   Platelets 150.0 - 400.0 K/uL 274.0  283     291.0      This result is from an external source.    Lab Results  Component Value Date/Time   VD25OH 21.70 (L) 08/23/2019 02:36 PM    Clinical ASCVD: No  The 10-year ASCVD risk score (Arnett DK, et al., 2019) is: 22.5%   Values used to calculate the score:  Age: 57 years     Sex: Female     Is Non-Hispanic African American: No     Diabetic: Yes     Tobacco smoker: No     Systolic Blood Pressure: 466 mmHg     Is BP treated: No     HDL Cholesterol: 43.7 mg/dL     Total Cholesterol: 139 mg/dL       12/17/2021    2:51 PM 05/23/2021   12:52 PM 02/19/2021    2:04 PM  Depression screen PHQ 2/9  Decreased Interest 0 1 0  Down, Depressed, Hopeless 0 0 0  PHQ - 2 Score 0 1 0  Altered sleeping  1   Tired, decreased energy  1   Change in appetite  1   Feeling bad or failure about yourself   0   Trouble concentrating  0   Moving slowly or fidgety/restless  0   Suicidal thoughts  0   PHQ-9 Score  4   Difficult doing work/chores  Not difficult at all      Social History   Tobacco Use  Smoking Status Never  Smokeless Tobacco Never   BP Readings from Last 3 Encounters:  04/14/22 120/70  01/24/22 100/70  01/23/22 124/68   Pulse Readings from Last 3 Encounters:  04/14/22 71  01/24/22 83  01/23/22 60   Wt Readings from Last 3 Encounters:  04/14/22 126 lb 4 oz (57.3 kg)  01/24/22 128 lb 5 oz (58.2 kg)  01/23/22 130 lb 9.6 oz (59.2 kg)   BMI Readings from Last 3 Encounters:  04/14/22 21.34 kg/m  01/24/22 22.02 kg/m  01/23/22 22.42 kg/m    Assessment/Interventions: Review of patient past medical history, allergies, medications, health status, including review of consultants reports, laboratory and other test data, was performed as part of  comprehensive evaluation and provision of chronic care management services.   SDOH:  (Social Determinants of Health) assessments and interventions performed: No SDOH Interventions    Flowsheet Row Clinical Support from 12/17/2021 in [place] at [place] Management from 07/19/2021 in [place] at [facility] Visit from 04/18/2021 in [place] at [place] Interventions Intervention Not Indicated -- --  Housing Interventions Intervention Not Indicated -- --  Transportation Interventions Intervention Not Indicated -- --  Depression Interventions/Treatment  -- -- Medication  Financial Strain Interventions Intervention Not Indicated Intervention Not Indicated --  Physical Activity Interventions Intervention Not Indicated -- --  Stress Interventions Intervention Not Indicated -- --  Social Connections Interventions Intervention Not Indicated -- --       SDOH Screenings   Food Insecurity: No Food Insecurity (12/17/2021)  Housing: Low Risk  (12/17/2021)  Transportation Needs: No Transportation Needs (12/17/2021)  Alcohol Screen: Low Risk  (12/17/2021)  Depression (PHQ2-9): Low Risk  (12/17/2021)  Financial Resource Strain: Low Risk  (12/17/2021)  Physical Activity: Unknown (12/17/2021)  Social Connections: Moderately Integrated (12/17/2021)  Stress: No Stress Concern Present (12/17/2021)  Tobacco Use: Low Risk  (01/23/2022)    CCM Care Plan  Allergies  Allergen Reactions   Niacin     REACTION: unbearable hot flashes   Sulfamethoxazole-Trimethoprim Itching    Medications Reviewed Today     Reviewed by Charlton Haws, [facility] (Pharmacist) on 04/29/22 at 1205  Med List Status: <None>   Medication Order Taking? Sig Documenting Provider Last Dose Status Informant  Accu-Chek Softclix Lancets lancets [id number] Yes 2 (two) times daily.  [provider] Taking Active   acetaminophen (TYLENOL)  325 MG tablet [id number] Yes Take 2 tablets by mouth as needed. [provider] Taking Active   Blood Glucose Monitoring Suppl (ACCU-CHEK GUIDE ME) w/Device KIT [id number] Yes USE AS DIRECTED TO CHECK BLOOD SUGAR TWICE DAILY [provider] Taking Active   busPIRone (BUSPAR) 15 MG tablet [id number] Yes Take 0.5 tablets (7.5 mg total) by mouth 2 (two) times daily.  Patient taking differently: Take 7.5 mg by mouth daily at 12 noon. PRN   Lesleigh Noe, MD Taking Active   empagliflozin (JARDIANCE) 25 MG TABS tablet [id number] Yes Take 1 tablet (25 mg total) by mouth daily before breakfast. Lesleigh Noe, MD Taking Active   glucose blood (ACCU-CHEK AVIVA PLUS) test strip [id number]  Use as instructed to test blood sugar 2 times daily E11.45 Shamleffer, Melanie Crazier, MD  Active   glucose blood test strip [id number]  1 each by Other route 2 (two) times daily. Use as instructed to check blood sugar 2 times daily with OneTouch meter. Lesleigh Noe, MD  Active   Insulin Degludec (TRESIBA) 100 UNIT/ML SOLN [id number] Yes Inject 10 Units into the skin daily with breakfast. Lesleigh Noe, MD Taking Active   metFORMIN (GLUCOPHAGE-XR) 500 MG 24 hr tablet [id number] Yes Take 2 tablets (1,000 mg total) by mouth daily with breakfast. (2 tablets) Lesleigh Noe, MD Taking Active   NEEDLE, DISP, 30 G (BD DISP NEEDLES) 30G X 1/2" MISC [id number] Yes Use as directed with insulin Lesleigh Noe, MD Taking Active   omeprazole (PRILOSEC) 20 MG capsule [id number] Yes Take 1 capsule (20 mg total) by mouth every other day. Lesleigh Noe, MD Taking Active   pravastatin (PRAVACHOL) 20 MG tablet [id number] Yes TAKE 1 TABLET BY MOUTH DAILY WITH EVENING MEAL Lesleigh Noe, MD Taking Active             Patient Active Problem List   Diagnosis Date Noted   B12 deficiency 01/24/2022   Weight loss 01/24/2022   Other fatigue 01/02/2022   Postural dizziness 05/23/2021   Generalized anxiety disorder  04/18/2021   Muscle spasm 04/18/2021   Diarrhea 01/08/2021   Osteopenia 01/01/2021   Thumb pain, left 08/21/2020   Shortness of breath 07/09/2020   Tinnitus of both ears 07/09/2020   Type 2 diabetes mellitus with hyperglycemia, without long-term current use of insulin ([place]) 10/03/2019   Type 2 diabetes mellitus with stage 3a chronic kidney disease, without long-term current use of insulin ([place]) 10/03/2019   Disease related peripheral neuropathy 08/22/2019   Urinary frequency 05/18/2019   Stress incontinence of urine 02/21/2019   Grief 07/15/2018   Pelvic prolapse 01/22/2017   Hyperlipidemia associated with type 2 diabetes mellitus ([place]) 05/03/2014   GERD (gastroesophageal reflux disease) 12/05/2013   CKD (chronic kidney disease) stage 3, GFR 30-59 ml/min ([place]) 11/27/2011   HYPERKALEMIA 11/30/2009   INSOMNIA 11/21/2009   FATIGUE 11/21/2009   Diabetes mellitus with renal manifestation ([place]) 07/25/2009   Essential hypertension 07/25/2009   COLONIC POLYPS, HX OF 07/25/2009    Immunization History  Administered Date(s) Administered   Influenza Split 06/03/2012   Influenza,inj,Quad PF,6+ Mos 08/10/2013, 05/03/2014, 05/06/2017, 07/15/2018, 05/18/2019   PFIZER(Purple Top)SARS-COV-2 Vaccination 09/18/2019, 10/11/2019   PPD Test 04/19/2013, 04/14/2022   Pneumococcal Conjugate-13 10/02/2014   Pneumococcal Polysaccharide-23 04/25/2013   Td 08/19/2007    Conditions to be addressed/monitored:  Hypertension, Hyperlipidemia, Diabetes, GERD, Anxiety, and Osteopenia  Care Plan : [place]  Plan  Updates made by Charlton Haws, RPH since 04/29/2022 12:00 AM     Problem: Hypertension, Hyperlipidemia, Diabetes, GERD, Anxiety, and Osteopenia      Long-Range Goal: Disease mgmt   Start Date: 07/19/2021  Expected End Date: 07/19/2022  Recent Progress: On track  Priority: High  Note:   Current Barriers:  Unable to achieve control of Diabetes  Struggles to adhere to prescribed  medication regimen  Pharmacist Clinical Goal(s):  Patient will achieve improvement in diabetes as evidenced by A1c < 8% adhere to prescribed medication regimen as evidenced by pt report through collaboration with PharmD and provider.   Interventions: 1:1 collaboration with Lesleigh Noe, MD regarding development and update of comprehensive plan of care as evidenced by provider attestation and co-signature Inter-disciplinary care team collaboration (see longitudinal plan of care) Comprehensive medication review performed; medication list updated in electronic medical record  Hypertension (BP goal <130/80) -Controlled - BP fluctuates at home; pt is off medication now; Pt reports drinking plenty of water lately ("more water than I've ever drank in my life") -Home BP: n/a -Current treatment: None -Medications previously tried: lisinopril (low BP) -Educated on BP goals and benefits of medications for prevention of heart attack, stroke and kidney damage; Importance of home blood pressure monitoring; -Counseled to monitor BP at home weekly -Recommended to continue current medication  Hyperlipidemia: (LDL goal < 100) -Query controlled - LDL 96 (07/2020), overdue for repeat lipid panel; pt endorses compliance with statin -Current treatment: Pravastatin 20 mg daily - Appropriate, Effective, Safe, Accessible -Educated on Cholesterol goals; Benefits of statin for ASCVD risk reduction; -Recommended to continue current medication; repeat lipid panel at annual visit  Diabetes (A1c goal <8%) -Uncontrolled - A1c 7.4% (08/2876) on Trulicity 1.5 mg; she has since been switched to [place] due to side effects with Trulicity -Previously metformin was reduced to 1000 mg/day due to diarrhea, which improved with lower dose -Dexcom training completed 03/17/22 Reviewed AGP report:  Generated at: Tue, Apr 29, 2022 8:44 AM EDT Reporting period: Wed Apr 16, 2022 - Tue Apr 29, 2022 ----------------------------- Glucose Details Average glucose: 195 mg/dL Standard deviation: 62 mg/dL GMI: 8.0% ----------------------------- Time in Range Very High: 20% High: 33% In Range: 47% Low: 0% Very Low: 0%  Target Range:70-180 mg/dL ----------------------------- CGM Details Sensor usage: 93% Days with CGM data: 13/14  -Previous AGP report:  Generated at: Thu, Apr 17, 2022 3:38 PM EDT Reporting period: Fri Apr 04, 2022 - Thu Apr 17, 2022 ----------------------------- Glucose Details Average glucose: 204 mg/dL Standard deviation: 57 mg/dL GMI: 8.2% ----------------------------- Time in Range Very High: 23% High: 40% In Range: 37% Low: 0% Very Low: 0%  Target Range:70-180 mg/dL ----------------------------- CGM Details Sensor usage: 79% Days with CGM data: 11/14  -Current medications: Jardiance 25 mg daily - Appropriate, Query Effective Metformin ER 500 mg -2 tablet daily -Appropriate, Query Effective Tresiba 10 units daily -Appropriate, Query Effective Dexcom G7 - Appropriate, Effective, Safe, Accessible -Medications previously tried: Januvia (nausea), Rybelsus (N/V), glipizide (10 years of tx), Trulicity (GI/wt loss) -Diet recall (yesterday):  Breakfast: scrambled egg, grits, liver pudding  Lunch: baloney sandwich -Compared to previous Dexcom report, sugar control is somewhat improved but patient still has sugar readings > 250 about 20% of the time. -Fasting glucose is also at goal (90-130 most days), so further titrating basal insulin may cause hypoglycemia without helping high post-prandial sugars -Medication choices limited - tried/failed Trulicity, Januvia, glipizide (x 10 yrs); to target post-prandial highs, bolus insulin or  meglitinides (Prandin) are options, though Prandin requires beta-cell function for efficacy and given she has already been on glipizide x 10 years I do question beta cell function now -Reviewed diet choices to improve  post-prandial spikes (discussed snacks < 15 g carbs) -Recommend to continue current medication; PCP appt next week  Osteopenia (Goal prevent fractures) -Controlled -Last DEXA Scan: 12/25/20   T-Score femoral neck: -1.5  T-Score total hip: -0.8  T-Score lumbar spine: -0.6  10-year probability of major osteoporotic fracture: 16%  10-year probability of hip fracture: 2.6% -Patient is not a candidate for pharmacologic treatment -Current treatment  None -Recommend [phone] units of vitamin D daily. Recommend 1200 mg of calcium daily from dietary and supplemental sources. Recommend weight-bearing and muscle strengthening exercises for building and maintaining bone density.  Health Maintenance -Vaccine gaps: Flu, covid booster, Shingrix, TD booster  Patient Goals/Self-Care Activities Patient will:  - take medications as prescribed as evidenced by patient report and record review focus on medication adherence by routine heck blood pressure weekly, document, and provide at future appointments check blood sugar daily, document, and provide at future appointments     Medication Assistance: None required.  Patient affirms current coverage meets needs.  Compliance/Adherence/Medication fill history: Care Gaps: None  Star-Rating Drugs: Pravastatin - PDC 67% (LF 04/09/22 x 90 ds) Metformin - PDC 96% (LF 04/09/22 x 30 ds) Jardiance - PDC 76% (LF 04/14/22 x 90 ds)  Medication Access: Within the past 30 days, how often has patient missed a dose of medication? 0 Is a pillbox or other method used to improve adherence? No  Factors that may affect medication adherence? adverse effects of medications Are meds synced by current pharmacy? No  Are meds delivered by current pharmacy? No  Does patient experience delays in picking up medications due to transportation concerns? No   [facility] Services Reviewed: Is patient disadvantaged to use [facility]?: No  Current Rx insurance plan: [employer]  [place] Name and location of Current pharmacy:  [facility], [place] [address] Phone: [phone] Fax: [phone]  [facility] services reviewed with patient today?: No  Patient requests to transfer care to [facility]?: No  Reason patient declined to change pharmacies: Loyalty to other pharmacy/Patient preference   Care Plan and Follow Up Patient Decision:  Patient agrees to Care Plan and Follow-up.  Plan: Telephone follow up appointment with care management team member scheduled for:  4 weeks  Charlene Brooke, PharmD, BCACP Clinical Pharmacist [facility] at [facility] [phone]

## 2022-04-29 NOTE — Patient Instructions (Signed)
Visit Information  Phone number for Pharmacist: [phone]   Goals Addressed   None     Care Plan : [place]  Updates made by Charlton Haws, RPH since 04/29/2022 12:00 AM     Problem: Hypertension, Hyperlipidemia, Diabetes, GERD, Anxiety, and Osteopenia      Long-Range Goal: Disease mgmt   Start Date: 07/19/2021  Expected End Date: 07/19/2022  Recent Progress: On track  Priority: High  Note:   Current Barriers:  Unable to achieve control of Diabetes  Struggles to adhere to prescribed medication regimen  Pharmacist Clinical Goal(s):  Patient will achieve improvement in diabetes as evidenced by A1c < 8% adhere to prescribed medication regimen as evidenced by pt report through collaboration with PharmD and provider.   Interventions: 1:1 collaboration with Lesleigh Noe, MD regarding development and update of comprehensive plan of care as evidenced by provider attestation and co-signature Inter-disciplinary care team collaboration (see longitudinal plan of care) Comprehensive medication review performed; medication list updated in electronic medical record  Hypertension (BP goal <130/80) -Controlled - BP fluctuates at home; pt is off medication now; Pt reports drinking plenty of water lately ("more water than I've ever drank in my life") -Home BP: n/a -Current treatment: None -Medications previously tried: lisinopril (low BP) -Educated on BP goals and benefits of medications for prevention of heart attack, stroke and kidney damage; Importance of home blood pressure monitoring; -Counseled to monitor BP at home weekly -Recommended to continue current medication  Hyperlipidemia: (LDL goal < 100) -Query controlled - LDL 96 (07/2020), overdue for repeat lipid panel; pt endorses compliance with statin -Current treatment: Pravastatin 20 mg daily - Appropriate, Effective, Safe, Accessible -Educated on Cholesterol goals; Benefits of statin for ASCVD risk  reduction; -Recommended to continue current medication; repeat lipid panel at annual visit  Diabetes (A1c goal <8%) -Uncontrolled - A1c 6.8% (10/4194) on Trulicity 1.5 mg; she has since been switched to [place] due to side effects with Trulicity -Previously metformin was reduced to 1000 mg/day due to diarrhea, which improved with lower dose -Dexcom training completed 03/17/22 Reviewed AGP report:  Generated at: Tue, Apr 29, 2022 8:44 AM EDT Reporting period: Wed Apr 16, 2022 - Tue Apr 29, 2022 ----------------------------- Glucose Details Average glucose: 195 mg/dL Standard deviation: 62 mg/dL GMI: 8.0% ----------------------------- Time in Range Very High: 20% High: 33% In Range: 47% Low: 0% Very Low: 0%  Target Range:70-180 mg/dL ----------------------------- CGM Details Sensor usage: 93% Days with CGM data: 13/14  -Previous AGP report:  Generated at: Thu, Apr 17, 2022 3:38 PM EDT Reporting period: Fri Apr 04, 2022 - Thu Apr 17, 2022 ----------------------------- Glucose Details Average glucose: 204 mg/dL Standard deviation: 57 mg/dL GMI: 8.2% ----------------------------- Time in Range Very High: 23% High: 40% In Range: 37% Low: 0% Very Low: 0%  Target Range:70-180 mg/dL ----------------------------- CGM Details Sensor usage: 79% Days with CGM data: 11/14  -Current medications: Jardiance 25 mg daily - Appropriate, Query Effective Metformin ER 500 mg -2 tablet daily -Appropriate, Query Effective Tresiba 10 units daily -Appropriate, Query Effective Dexcom G7 - Appropriate, Effective, Safe, Accessible -Medications previously tried: Januvia (nausea), Rybelsus (N/V), glipizide (10 years of tx), Trulicity (GI/wt loss) -Diet recall (yesterday):  Breakfast: scrambled egg, grits, liver pudding  Lunch: baloney sandwich -Compared to previous Dexcom report, sugar control is somewhat improved but patient still has sugar readings > 250 about 20% of the time. -Fasting  glucose is also at goal (90-130 most days), so further titrating basal insulin may  cause hypoglycemia without helping high post-prandial sugars -Medication choices limited - tried/failed Trulicity, Januvia, glipizide (x 10 yrs); to target post-prandial highs, bolus insulin or meglitinides (Prandin) are options, though Prandin requires beta-cell function for efficacy and given she has already been on glipizide x 10 years I do question beta cell function now -Reviewed diet choices to improve post-prandial spikes (discussed snacks < 15 g carbs) -Recommend to continue current medication; PCP appt next week  Osteopenia (Goal prevent fractures) -Controlled -Last DEXA Scan: 12/25/20   T-Score femoral neck: -1.5  T-Score total hip: -0.8  T-Score lumbar spine: -0.6  10-year probability of major osteoporotic fracture: 16%  10-year probability of hip fracture: 2.6% -Patient is not a candidate for pharmacologic treatment -Current treatment  None -Recommend [phone] units of vitamin D daily. Recommend 1200 mg of calcium daily from dietary and supplemental sources. Recommend weight-bearing and muscle strengthening exercises for building and maintaining bone density.  Health Maintenance -Vaccine gaps: Flu, covid booster, Shingrix, TD booster  Patient Goals/Self-Care Activities Patient will:  - take medications as prescribed as evidenced by patient report and record review focus on medication adherence by routine heck blood pressure weekly, document, and provide at future appointments check blood sugar daily, document, and provide at future appointments      Patient verbalizes understanding of instructions and care plan provided today and agrees to view in [place]. Active MyChart status and patient understanding of how to access instructions and care plan via MyChart confirmed with patient.    Telephone follow up appointment with pharmacy team member scheduled for: 1 month  Charlene Brooke, PharmD,  [facility] Clinical Pharmacist [facility] [phone]

## 2022-05-03 DIAGNOSIS — Z7984 Long term (current) use of oral hypoglycemic drugs: Secondary | ICD-10-CM

## 2022-05-03 DIAGNOSIS — E785 Hyperlipidemia, unspecified: Secondary | ICD-10-CM

## 2022-05-03 DIAGNOSIS — I10 Essential (primary) hypertension: Secondary | ICD-10-CM

## 2022-05-03 DIAGNOSIS — M8580 Other specified disorders of bone density and structure, unspecified site: Secondary | ICD-10-CM

## 2022-05-03 DIAGNOSIS — E1169 Type 2 diabetes mellitus with other specified complication: Secondary | ICD-10-CM | POA: Diagnosis not present

## 2022-05-05 ENCOUNTER — Encounter: Payer: Self-pay | Admitting: Family Medicine

## 2022-05-05 ENCOUNTER — Ambulatory Visit: Payer: Medicare PPO | Admitting: Family Medicine

## 2022-05-05 ENCOUNTER — Other Ambulatory Visit: Payer: Self-pay | Admitting: Family Medicine

## 2022-05-05 VITALS — BP 120/60 | HR 73 | Temp 96.5°F | Ht 64.5 in | Wt 129.2 lb

## 2022-05-05 DIAGNOSIS — H539 Unspecified visual disturbance: Secondary | ICD-10-CM | POA: Diagnosis not present

## 2022-05-05 DIAGNOSIS — Z794 Long term (current) use of insulin: Secondary | ICD-10-CM | POA: Diagnosis not present

## 2022-05-05 DIAGNOSIS — N1831 Chronic kidney disease, stage 3a: Secondary | ICD-10-CM | POA: Diagnosis not present

## 2022-05-05 DIAGNOSIS — E1122 Type 2 diabetes mellitus with diabetic chronic kidney disease: Secondary | ICD-10-CM | POA: Diagnosis not present

## 2022-05-05 DIAGNOSIS — Z1231 Encounter for screening mammogram for malignant neoplasm of breast: Secondary | ICD-10-CM

## 2022-05-05 MED ORDER — REPAGLINIDE 1 MG PO TABS: 1.0000 mg | ORAL_TABLET | Freq: Three times a day (TID) | ORAL | 2 refills | Status: DC

## 2022-05-05 NOTE — Patient Instructions (Addendum)
Diabetes - Stop Jardiance - Continue Metformin and insulin - If fasting CBG >140 update Lindsay next week   Start Prandin 1 mg, take 30 minutes before meals - 3 times a daily  - if you notice side effects reduce to 1/2 pill  Call Endocrinology for follow-up [place], [place], [place] [address]  ~1.2 mi [phone]

## 2022-05-05 NOTE — Assessment & Plan Note (Signed)
Lab Results  Component Value Date   HGBA1C 8.5 (H) 04/28/2022   Not at goal. Reviewed pharmacy and compared to most recent dexcom readings - continues to be in range only ~40% of the time. Advised trial of prandin 1 mg TID w/ meals. Discussed if ineffective she will need mealtime insulin. She has also had a yeast infection in urine suspect 2/2 to jardiance - advised stopping. Will plan to increase insulin as needed. Referral back to endocrine for continue support.

## 2022-05-05 NOTE — Assessment & Plan Note (Signed)
Advised f/u with optometry. Recent eye exam normal but new changes.

## 2022-05-05 NOTE — Progress Notes (Signed)
Subjective:     Wanda [name] is a 76 y.o. female presenting for Follow-up (DM )     HPI  #Diabetes Currently taking insulin, metformin, jardiance  Using medications without difficulties: No Hypoglycemic episodes:No  Hyperglycemic episodes:Yes  Feet problems:No  Blood Sugars averaging: only 47% in range Last HgbA1c:  Lab Results  Component Value Date   HGBA1C 8.5 (H) 04/28/2022    Diabetes Health Maintenance Due:    Diabetes Health Maintenance Due  Topic Date Due   HEMOGLOBIN A1C  10/27/2022   OPHTHALMOLOGY EXAM  11/02/2022   FOOT EXAM  04/15/2023   Does not have a good time with eating Does not eat regularly and then will eat a lot when finally hungry  Did try to purchase all the recommended healthy snacks for diabetics  Pt notes she saw urology and was diagnosed with a yeast infection    Review of Systems   Social History   Tobacco Use  Smoking Status Never  Smokeless Tobacco Never        Objective:    BP Readings from Last 3 Encounters:  05/05/22 120/60  04/14/22 120/70  01/24/22 100/70   Wt Readings from Last 3 Encounters:  05/05/22 129 lb 4 oz (58.6 kg)  04/14/22 126 lb 4 oz (57.3 kg)  01/24/22 128 lb 5 oz (58.2 kg)    BP 120/60   Pulse 73   Temp (!) 96.5 F (35.8 C) (Temporal)   Ht 5' 4.5" (1.638 m)   Wt 129 lb 4 oz (58.6 kg)   SpO2 97%   BMI 21.84 kg/m    Physical Exam Constitutional:      General: She is not in acute distress.    Appearance: She is well-developed. She is not diaphoretic.  HENT:     Right Ear: External ear normal.     Left Ear: External ear normal.     Nose: Nose normal.  Eyes:     Conjunctiva/sclera: Conjunctivae normal.  Cardiovascular:     Rate and Rhythm: Normal rate and regular rhythm.     Heart sounds: No murmur heard. Pulmonary:     Effort: Pulmonary effort is normal. No respiratory distress.     Breath sounds: Normal breath sounds. No wheezing.  Musculoskeletal:     Cervical back: Neck supple.   Skin:    General: Skin is warm and dry.     Capillary Refill: Capillary refill takes less than 2 seconds.  Neurological:     Mental Status: She is alert. Mental status is at baseline.  Psychiatric:        Mood and Affect: Mood normal.        Behavior: Behavior normal.           Assessment & Plan:   Problem List Items Addressed This Visit       Endocrine   Type 2 diabetes mellitus with diabetic chronic kidney disease ([place]) - Primary    Lab Results  Component Value Date   HGBA1C 8.5 (H) 04/28/2022  Not at goal. Reviewed pharmacy and compared to most recent dexcom readings - continues to be in range only ~40% of the time. Advised trial of prandin 1 mg TID w/ meals. Discussed if ineffective she will need mealtime insulin. She has also had a yeast infection in urine suspect 2/2 to jardiance - advised stopping. Will plan to increase insulin as needed. Referral back to endocrine for continue support.       Relevant Medications   repaglinide (  PRANDIN) 1 MG tablet   Other Relevant Orders   Ambulatory referral to Endocrinology     Other   Vision changes    Advised f/u with optometry. Recent eye exam normal but new changes.         Return in about 6 weeks (around 06/16/2022) for TOC with Dr. Diona Browner or Eugenia Pancoast  - Diabetes .  Lesleigh Noe, MD

## 2022-05-13 ENCOUNTER — Other Ambulatory Visit: Payer: Self-pay | Admitting: Family Medicine

## 2022-05-13 DIAGNOSIS — E1165 Type 2 diabetes mellitus with hyperglycemia: Secondary | ICD-10-CM | POA: Diagnosis not present

## 2022-05-13 DIAGNOSIS — Z794 Long term (current) use of insulin: Secondary | ICD-10-CM | POA: Diagnosis not present

## 2022-05-19 ENCOUNTER — Ambulatory Visit: Payer: Medicare PPO | Admitting: Internal Medicine

## 2022-05-19 ENCOUNTER — Encounter: Payer: Self-pay | Admitting: Internal Medicine

## 2022-05-19 VITALS — BP 136/80 | HR 74 | Ht 64.5 in | Wt 134.0 lb

## 2022-05-19 DIAGNOSIS — Z794 Long term (current) use of insulin: Secondary | ICD-10-CM | POA: Diagnosis not present

## 2022-05-19 DIAGNOSIS — E1142 Type 2 diabetes mellitus with diabetic polyneuropathy: Secondary | ICD-10-CM | POA: Insufficient documentation

## 2022-05-19 DIAGNOSIS — E1165 Type 2 diabetes mellitus with hyperglycemia: Secondary | ICD-10-CM

## 2022-05-19 DIAGNOSIS — N1831 Chronic kidney disease, stage 3a: Secondary | ICD-10-CM | POA: Diagnosis not present

## 2022-05-19 DIAGNOSIS — E1122 Type 2 diabetes mellitus with diabetic chronic kidney disease: Secondary | ICD-10-CM | POA: Diagnosis not present

## 2022-05-19 MED ORDER — REPAGLINIDE 1 MG PO TABS: 1.0000 mg | ORAL_TABLET | Freq: Three times a day (TID) | ORAL | 1 refills | Status: DC

## 2022-05-19 MED ORDER — METFORMIN HCL ER 500 MG PO TB24: 1000.0000 mg | ORAL_TABLET | Freq: Every day | ORAL | 3 refills | Status: DC

## 2022-05-19 MED ORDER — TRESIBA FLEXTOUCH 100 UNIT/ML ~~LOC~~ SOPN: 12.0000 [IU] | PEN_INJECTOR | Freq: Every day | SUBCUTANEOUS | 3 refills | Status: DC

## 2022-05-19 MED ORDER — INSULIN PEN NEEDLE 32G X 4 MM MISC: 1.0000 | Freq: Every day | 3 refills | Status: DC

## 2022-05-19 NOTE — Progress Notes (Signed)
Name: Wanda [name]  MRN/ DOB: [id number], [birth year]   Age/ Sex: 76 y.o., female    PCP: [name], MD   Reason for Endocrinology Evaluation: Type 2 Diabetes Mellitus     Date of Initial Endocrinology Visit: 05/19/2022     PATIENT IDENTIFIER: [name] is a 76 y.o. female with a past medical history of T2DM, Dyslipidemia and HTN. The patient presented for initial endocrinology clinic visit on 05/19/2022 for consultative assistance with her diabetes management.    HPI: Ms. Volcy was    Diagnosed with DM > 30 yrs Prior Medications tried/Intolerance: repaglinide was started 05/2022, insulin was started 03/2022. Trulicity caused severe weight loss . Jardiance - yeast infection  .  Intolerant to higher doses of metformin Currently checking blood sugars multiple times a day through CGM  Hypoglycemia episodes : yes               Symptoms: yes                 Frequency: rare Hemoglobin A1c has ranged from 6.9% in 2014, peaking at 9.6% in 2023. Patient required assistance for hypoglycemia: no Patient has required hospitalization within the last 1 year from hyper or hypoglycemia: no  In terms of diet, the patient eats 2 meals a day, snacks during the day   Pt follows with urology for urinary frequency   Has burning sensation of the feet   Denies pancreatitis but has chronic abdominal  pain  Denies nausea, vomiting or diarrhea  Patient is a poor historian  HOME DIABETES REGIMEN: Metformin 500 mg XR 2 tabs daily  Repaglinide 1 mg TIDQAC Tresiba 10 units daily      Statin: yes ACE-I/ARB: no    CGM:  45% within range  >180 39 units  >250 15 units      DIABETIC COMPLICATIONS: Microvascular complications:  Neuropathy  Denies: retinopathy, CKD  Last eye exam: Completed 04/2022  Macrovascular complications:   Denies: CAD, PVD, CVA   PAST HISTORY: Past Medical History:  Past Medical History:  Diagnosis Date   Anxiety    Colon polyps    Depression    DM  (diabetes mellitus) ([place])    Female bladder prolapse    Fibromyalgia    "neurofibromyalgia"   Headache    HTN (hypertension)    Hyperlipidemia    Past Surgical History:  Past Surgical History:  Procedure Laterality Date   COLONOSCOPY  03/28/2019   Dr Collene Mares   WRIST SURGERY  1969    Social History:  reports that she has never smoked. She has never used smokeless tobacco. She reports that she does not drink alcohol and does not use drugs. Family History:  Family History  Problem Relation Age of Onset   Diabetes Mother    Hypertension Mother    Hyperlipidemia Mother    Stroke Mother    Heart disease Mother    Heart disease Father    Diabetes Sister    Diabetes Sister    Breast cancer Sister    Parkinson's disease Brother    Heart disease Maternal Grandmother    Diabetes Maternal Grandfather    Heart attack Other    Colon cancer Neg Hx    Esophageal cancer Neg Hx      HOME MEDICATIONS: Allergies as of 05/19/2022       Reactions   Niacin    REACTION: unbearable hot flashes   Sulfamethoxazole-trimethoprim Itching        Medication List  Accurate as of May 19, 2022  4:02 PM. If you have any questions, ask your nurse or doctor.          STOP taking these medications    Insulin Degludec 100 UNIT/ML Soln Commonly known as: Tresiba Replaced by: Tyler Aas FlexTouch 100 UNIT/ML FlexTouch Pen Stopped by: Dorita Sciara, MD       TAKE these medications    Accu-Chek Guide Me w/Device Kit USE AS DIRECTED TO CHECK BLOOD SUGAR TWICE DAILY   Accu-Chek Softclix Lancets lancets 2 (two) times daily.   acetaminophen 325 MG tablet Commonly known as: TYLENOL Take 2 tablets by mouth as needed.   BD Disp Needles 30G X 1/2" Misc Generic drug: NEEDLE (DISP) 30 G Use as directed with insulin   busPIRone 15 MG tablet Commonly known as: BUSPAR Take 0.5 tablets (7.5 mg total) by mouth 2 (two) times daily. What changed:  when to take this additional  instructions   Insulin Pen Needle 32G X 4 MM Misc 1 Device by Does not apply route daily in the afternoon. Started by: Dorita Sciara, MD   metFORMIN 500 MG 24 hr tablet Commonly known as: GLUCOPHAGE-XR Take 2 tablets (1,000 mg total) by mouth daily with breakfast.   omeprazole 20 MG capsule Commonly known as: PRILOSEC Take 1 capsule (20 mg total) by mouth every other day.   pravastatin 20 MG tablet Commonly known as: PRAVACHOL TAKE 1 TABLET BY MOUTH DAILY WITH EVENING MEAL   repaglinide 1 MG tablet Commonly known as: Prandin Take 1 tablet (1 mg total) by mouth 3 (three) times daily before meals.   Tyler Aas FlexTouch 100 UNIT/ML FlexTouch Pen Generic drug: insulin degludec Inject 12 Units into the skin daily. Replaces: Insulin Degludec 100 UNIT/ML Soln Started by: Dorita Sciara, MD         ALLERGIES: Allergies  Allergen Reactions   Niacin     REACTION: unbearable hot flashes   Sulfamethoxazole-Trimethoprim Itching     REVIEW OF SYSTEMS: A comprehensive ROS was conducted with the patient and is negative except as per HPI   OBJECTIVE:   VITAL SIGNS: BP 136/80 (BP Location: Left Arm, Patient Position: Sitting, Cuff Size: Small)   Pulse 74   Ht 5' 4.5" (1.638 m)   Wt 134 lb (60.8 kg)   SpO2 99%   BMI 22.65 kg/m    PHYSICAL EXAM:  General: Pt appears well and is in NAD  Neck: General: Supple without adenopathy or carotid bruits. Thyroid: Thyroid size normal.    Lungs: Clear with good BS bilat with no rales, rhonchi, or wheezes  Heart: RRR   Abdomen:  soft, nontender  Extremities:  Lower extremities - No pretibial edema. No lesions.  Neuro: MS is good with appropriate affect, pt is alert and Ox3    DM foot exam: 05/19/2022  The skin of the feet is intact without sores or ulcerations. The pedal pulses are 2+ on right and 2+ on left. The sensation is intact to a screening 5.07, 10 gram monofilament bilaterally   DATA REVIEWED:  Lab  Results  Component Value Date   HGBA1C 8.5 (H) 04/28/2022   HGBA1C 8.5 (A) 01/24/2022   HGBA1C 9.6 (A) 10/11/2021   Lab Results  Component Value Date   MICROALBUR 1.3 10/02/2014   LDLCALC 75 04/28/2022   CREATININE 1.00 04/28/2022   Lab Results  Component Value Date   MICRALBCREAT 0.156 07/16/2021    Lab Results  Component Value Date   CHOL 139  04/28/2022   HDL 43.70 04/28/2022   LDLCALC 75 04/28/2022   LDLDIRECT 96.0 07/06/2020   TRIG 100.0 04/28/2022   CHOLHDL 3 04/28/2022        Old records , labs and images have been reviewed.    ASSESSMENT / PLAN / RECOMMENDATIONS:   1) Type 2 Diabetes Mellitus, Poorly controlled, With Neuropathic and CKD III complications - Most recent A1c of 8.5 %. Goal A1c < 7.0 %.      -We were unable to download her Dexcom today but I did take a look minimally at the app, she has been noted with hypoglycemia during the day, her BG's tend to trend down in the latter part of the night -I am going to increase her [place] as below -No changes to the repaglinide, but I did caution her against hypoglycemia, she was advised to take it 20-30 minutes before a meal -Emphasized the importance of avoiding sugar sweetened beverages and limiting carbohydrate intake with snacks, we also discussed my preference to avoid snacks if possible - Intolerant to higher metformin -She developed yeast infection to [place], and is currently under the care of urology for urinary frequency and prolapse -She believes she developed abdominal pain with Trulicity  MEDICATIONS:  Continue metformin 500 mg, 2 tabs daily Increase Tresiba 12 units daily Continue repaglinide 1 mg 3 times daily before every meal  EDUCATION / INSTRUCTIONS: BG monitoring instructions: Patient is instructed to check her blood sugars 3 times a day, before meals. Call [facility] if: BG persistently < 70  I reviewed the Rule of 15 for the treatment of hypoglycemia in detail with  the patient. Literature supplied.   2) Diabetic complications:  Eye: Does not have known diabetic retinopathy.  Neuro/ Feet: Does  have known diabetic peripheral neuropathy. Renal: Patient does not have known baseline CKD. She is not on an ACEI/ARB at present.    Follow-up in 4 months    Signed electronically by: Mack Guise, MD  [facility] Endocrinology  [facility] [address] [place], [place] [address] Phone: [phone] FAX: [phone]   CC: [name], [place] [address] Phone: [phone]  Fax: [phone]    Return to Endocrinology clinic as below: Future Appointments  Date Time Provider [place]  05/29/2022  1:30 PM [facility] CCM PHARMACIST [facility]  06/16/2022  1:00 PM [facility] MM 3 [facility]  08/19/2022 11:00 AM Jinny Sanders, MD [facility]  12/19/2022  2:15 PM [facility] NURSE HEALTH ADVISOR [facility]

## 2022-05-19 NOTE — Patient Instructions (Addendum)
Continue Metformin 500 mg XR 2 tablets  daily  Continue Repaglinide 1 mg , 1 tablet before each meal  Increase Tresiba 12 units daily    Choose healthy, lower carb lower calorie snacks: toss salad, cooked vegetables, cottage cheese, peanut butter, low fat cheese / string cheese, lower sodium deli meat, tuna salad or chicken salad   HOW TO TREAT LOW BLOOD SUGARS (Blood sugar LESS THAN 70 MG/DL) Please follow the RULE OF 15 for the treatment of hypoglycemia treatment (when your (blood sugars are less than 70 mg/dL)   STEP 1: Take 15 grams of carbohydrates when your blood sugar is low, which includes:  3-4 GLUCOSE TABS  OR 3-4 OZ OF JUICE OR REGULAR SODA OR ONE TUBE OF GLUCOSE GEL    STEP 2: RECHECK blood sugar in 15 MINUTES STEP 3: If your blood sugar is still low at the 15 minute recheck --> then, go back to STEP 1 and treat AGAIN with another 15 grams of carbohydrates.

## 2022-05-26 ENCOUNTER — Telehealth: Payer: Self-pay

## 2022-05-26 NOTE — Progress Notes (Signed)
    Chronic Care Management Pharmacy Assistant   Name: Wanda [name]  MRN: [id number] DOB: 1945-10-23  Reason for Encounter: CCM (Appointment Reminder)  Medications: Outpatient Encounter Medications as of 05/26/2022  Medication Sig   Accu-Chek Softclix Lancets lancets 2 (two) times daily.   acetaminophen (TYLENOL) 325 MG tablet Take 2 tablets by mouth as needed.   Blood Glucose Monitoring Suppl (ACCU-CHEK GUIDE ME) w/Device KIT USE AS DIRECTED TO CHECK BLOOD SUGAR TWICE DAILY   busPIRone (BUSPAR) 15 MG tablet Take 0.5 tablets (7.5 mg total) by mouth 2 (two) times daily. (Patient taking differently: Take 7.5 mg by mouth daily at 12 noon. PRN)   insulin degludec (TRESIBA FLEXTOUCH) 100 UNIT/ML FlexTouch Pen Inject 12 Units into the skin daily.   Insulin Pen Needle 32G X 4 MM MISC 1 Device by Does not apply route daily in the afternoon.   metFORMIN (GLUCOPHAGE-XR) 500 MG 24 hr tablet Take 2 tablets (1,000 mg total) by mouth daily with breakfast.   NEEDLE, DISP, 30 G (BD DISP NEEDLES) 30G X 1/2" MISC Use as directed with insulin   omeprazole (PRILOSEC) 20 MG capsule Take 1 capsule (20 mg total) by mouth every other day.   pravastatin (PRAVACHOL) 20 MG tablet TAKE 1 TABLET BY MOUTH DAILY WITH EVENING MEAL   repaglinide (PRANDIN) 1 MG tablet Take 1 tablet (1 mg total) by mouth 3 (three) times daily before meals.   No facility-administered encounter medications on file as of 05/26/2022.   Wanda [name] was contacted to remind of upcoming telephone visit with Charlene Brooke on 05/29/2022 at 1:30. Patient was reminded to have any blood glucose and blood pressure readings available for review at appointment.   Message was left reminding patient of appointment.  CCM referral has been placed prior to visit?  Yes   Star Rating Drugs: Medication:  Last Fill: Day Supply Metformin 500 mg 05/13/22 90 Pravastatin 20 mg 04/09/22 [place], CPP notified  Marijean Niemann, [place] Clinical Pharmacy  Assistant [phone]

## 2022-05-29 ENCOUNTER — Ambulatory Visit: Payer: Medicare PPO | Admitting: Pharmacist

## 2022-05-29 DIAGNOSIS — Z794 Long term (current) use of insulin: Secondary | ICD-10-CM

## 2022-05-29 DIAGNOSIS — I10 Essential (primary) hypertension: Secondary | ICD-10-CM

## 2022-05-29 DIAGNOSIS — E1169 Type 2 diabetes mellitus with other specified complication: Secondary | ICD-10-CM

## 2022-05-29 NOTE — Progress Notes (Signed)
Chronic Care Management Pharmacy Note  05/29/22 Name:  Wanda [name] MRN:  [id number] DOB:  [birth year]  Summary: CCM F/U visit -DM: A1c 8.5% (04/2022); pt is compliant with medications (recently stopped Jardiance and added repaglinide), she has had a few low sugars since and treats with OJ Reviewed AGP report: 05/16/22 to 05/29/22. Sensor active: 93%  Time in range (70-180): 52% (goal > 70%)  High (>180): 46%  Low (< 70): <2% (goal < 4%)  GMI: 7.5%; Average glucose: 175 -Compared to previous Dexcom report, DM control is improved (GMI improved from 8.0% to 7.5%)  Recommendations/Changes made from today's visit: -No med changes; f/u with endocrine as scheduled   Plan: -Pharmacist follow up televisit scheduled for 1 month -PCP appt 08/19/22 (TOC - Dr Diona Browner)    Subjective: Wanda [name] is an 76 y.o. year old female who is a primary patient of Wanda Schooner, MD.  The CCM team was consulted for assistance with disease management and care coordination needs.    Engaged with patient by telephone for follow up visit in response to provider referral for pharmacy case management and/or care coordination services.   Consent to Services:  The patient was given information about Chronic Care Management services, agreed to services, and gave verbal consent prior to initiation of services.  Please see initial visit note for detailed documentation.   Patient Care Team: Wanda Schooner, MD as PCP - General (Family Medicine) Katy Apo, MD as Consulting Physician (Ophthalmology) [facility], Melanie Crazier, MD as Consulting Physician (Endocrinology) Willia Craze, NP as Nurse Practitioner (Gastroenterology) Charlton Haws, [facility] as Pharmacist (Pharmacist)  Recent office visits: 05/05/22 Dr Einar Pheasant OV: DM - referred to endocrine. D/C Jardiance. Start Prandin 1 mg TID w/ meals.  04/14/22 Dr Einar Pheasant OV: annual - A1c 8.5%;   03/03/22 TE - start Tyler Aas, stop trulicity.  01/24/22 Dr Einar Pheasant OV: f/u  DM - A1c 2.5%; increase Trulicity to 3 mg. F/u 3 months. Encouraged Buspar BID as prescribed. Referred for therapy. Pt lost 8-10 lbs. Declined nutrition referral.  05/23/2021 - Wanda Schooner, MD - Patient presented for follow up for diabetes. Continue Buspar 7.5 mg BID due to significant improvement. Change: Decrease Lisinopril 10 > 83m due to orthostatic hypotension.   04/18/2021 - [name] MD - Patient presented for diabetes follow up. Referral to Gastroenterology due to nausea and difficulty swallowing. Change: switch glipizide 5 mg bid to glipizide XL 10 mg daily. Change: Increase jardiance to 25 mg. Change: Increase buspar 3.75 > 7.5 mg and to 15 mg daily if no improvement after 2 weeks for anxiety.   Recent consult visits: 05/19/22 Dr SKelton Pillar(Endocrine): Increase Tresiba to 12 units. Take Prandin 20-30 min before eating.   01/23/22 PA Amy Esterwood (GI): weight loss - 6-7 mo hx of diarrhea. Decrease in appetite 3-4 mos. Asked pt to stop Trulicity - 1 month trial off.  07/23/21 Dr SCandiss Norse(Nephrology): f/u CKD. Stable. No changes. F/U 1 year.  04/02/2021 - SJohny Drilling RN - Nutrition - Patient presented for diabetes. Recommended: Check blood sugars 1 x day before breakfast or 2 hrs after supper every day.   02/25/2021 - TCasimiro Needle RN - Urology - Telephone - Start: cephalexin ([facility] 500 MG capsule for 5 days.   02/20/2021 - CMaryland Pink- Urology - Patient presented for urinary frequency and prolapse of vaginal wall. No other information provided.   02/19/2021 - SJohny Drilling RN - Nutrition - Patient presented for diabetes. Recommended: Eat 3 meals day, 1-2  snacks a day, space meals 4-6 hours apart, don't skip meals and limit fried foods, desserts and sweets. Complete 3 Day Food Record and bring to next appt.   Hospital visits: None in previous 6 months   Objective:  Lab Results  Component Value Date   CREATININE 1.00 04/28/2022   BUN 22 04/28/2022   GFR  55.51 (L) 04/28/2022   GFRNONAA 57 (L) 06/23/2014   GFRAA 67 (L) 06/23/2014   NA 143 04/28/2022   K 4.1 04/28/2022   CALCIUM 9.2 04/28/2022   CO2 25 04/28/2022   GLUCOSE 130 (H) 04/28/2022    Lab Results  Component Value Date/Time   HGBA1C 8.5 (H) 04/28/2022 09:09 AM   HGBA1C 8.5 (A) 01/24/2022 10:26 AM   HGBA1C 9.6 (A) 10/11/2021 10:49 AM   HGBA1C 9.3 (H) 07/06/2020 02:33 PM   HGBA1C 7.7 08/25/2018 10:28 AM   GFR 55.51 (L) 04/28/2022 09:09 AM   GFR 49.62 (L) 01/02/2022 10:04 AM   MICROALBUR 1.3 10/02/2014 03:01 PM   MICROALBUR 1.9 08/05/2013 09:20 AM    Last diabetic Eye exam:  Lab Results  Component Value Date/Time   HMDIABEYEEXA No Retinopathy 11/01/2021 12:00 AM    Last diabetic Foot exam: No results found for: "HMDIABFOOTEX"   Lab Results  Component Value Date   CHOL 139 04/28/2022   HDL 43.70 04/28/2022   LDLCALC 75 04/28/2022   LDLDIRECT 96.0 07/06/2020   TRIG 100.0 04/28/2022   CHOLHDL 3 04/28/2022       Latest Ref Rng & Units 01/02/2022   10:04 AM 07/16/2021   12:00 AM 07/06/2020    2:33 PM  Hepatic Function  Total Protein 6.0 - 8.3 g/dL 7.3   7.4   Albumin 3.5 - 5.2 g/dL 4.2  4.3     4.2   AST 0 - 37 U/L 18   32   ALT 0 - 35 U/L 17   34   Alk Phosphatase 39 - 117 U/L 90   90   Total Bilirubin 0.2 - 1.2 mg/dL 0.5   0.4      This result is from an external source.    Lab Results  Component Value Date/Time   TSH 3.43 01/02/2022 10:04 AM   TSH 3.57 08/23/2019 02:36 PM       Latest Ref Rng & Units 01/02/2022   10:04 AM 07/16/2021   12:00 AM 07/06/2020    2:33 PM  CBC  WBC 4.0 - 10.5 K/uL 6.8  7.2     7.8   Hemoglobin 12.0 - 15.0 g/dL 13.0  13.2     12.8   Hematocrit 36.0 - 46.0 % 40.4  40     38.8   Platelets 150.0 - 400.0 K/uL 274.0  283     291.0      This result is from an external source.    Lab Results  Component Value Date/Time   VD25OH 21.70 (L) 08/23/2019 02:36 PM    Clinical ASCVD: No  The 10-year ASCVD risk score (Arnett DK, et  al., 2019) is: 27.8%   Values used to calculate the score:     Age: 40 years     Sex: Female     Is Non-Hispanic African American: No     Diabetic: Yes     Tobacco smoker: No     Systolic Blood Pressure: 563 mmHg     Is BP treated: No     HDL Cholesterol: 43.7 mg/dL     Total Cholesterol:  139 mg/dL       12/17/2021    2:51 PM 05/23/2021   12:52 PM 02/19/2021    2:04 PM  Depression screen PHQ 2/9  Decreased Interest 0 1 0  Down, Depressed, Hopeless 0 0 0  PHQ - 2 Score 0 1 0  Altered sleeping  1   Tired, decreased energy  1   Change in appetite  1   Feeling bad or failure about yourself   0   Trouble concentrating  0   Moving slowly or fidgety/restless  0   Suicidal thoughts  0   PHQ-9 Score  4   Difficult doing work/chores  Not difficult at all      Social History   Tobacco Use  Smoking Status Never  Smokeless Tobacco Never   BP Readings from Last 3 Encounters:  05/19/22 136/80  05/05/22 120/60  04/14/22 120/70   Pulse Readings from Last 3 Encounters:  05/19/22 74  05/05/22 73  04/14/22 71   Wt Readings from Last 3 Encounters:  05/19/22 134 lb (60.8 kg)  05/05/22 129 lb 4 oz (58.6 kg)  04/14/22 126 lb 4 oz (57.3 kg)   BMI Readings from Last 3 Encounters:  05/19/22 22.65 kg/m  05/05/22 21.84 kg/m  04/14/22 21.34 kg/m    Assessment/Interventions: Review of patient past medical history, allergies, medications, health status, including review of consultants reports, laboratory and other test data, was performed as part of comprehensive evaluation and provision of chronic care management services.   SDOH:  (Social Determinants of Health) assessments and interventions performed: No SDOH Interventions    Flowsheet Row Clinical Support from 12/17/2021 in [place] at [place] Management from 07/19/2021 in [place] at [facility] Visit from 04/18/2021 in [place] at [place] Interventions Intervention Not Indicated -- --  Housing Interventions Intervention Not Indicated -- --  Transportation Interventions Intervention Not Indicated -- --  Depression Interventions/Treatment  -- -- Medication  Financial Strain Interventions Intervention Not Indicated Intervention Not Indicated --  Physical Activity Interventions Intervention Not Indicated -- --  Stress Interventions Intervention Not Indicated -- --  Social Connections Interventions Intervention Not Indicated -- --       SDOH Screenings   Food Insecurity: No Food Insecurity (12/17/2021)  Housing: Low Risk  (12/17/2021)  Transportation Needs: No Transportation Needs (12/17/2021)  Alcohol Screen: Low Risk  (12/17/2021)  Depression (PHQ2-9): Low Risk  (12/17/2021)  Financial Resource Strain: Low Risk  (12/17/2021)  Physical Activity: Unknown (12/17/2021)  Social Connections: Moderately Integrated (12/17/2021)  Stress: No Stress Concern Present (12/17/2021)  Tobacco Use: Low Risk  (05/19/2022)    CCM Care Plan  Allergies  Allergen Reactions   Niacin     REACTION: unbearable hot flashes   Sulfamethoxazole-Trimethoprim Itching    Medications Reviewed Today     Reviewed by Jefferson Fuel, CMA (Certified Medical Assistant) on 05/19/22 at 1417  Med List Status: <None>   Medication Order Taking? Sig Documenting Provider Last Dose Status Informant  Accu-Chek Softclix Lancets lancets [id number] Yes 2 (two) times daily. [provider] Taking Active   acetaminophen (TYLENOL) 325 MG tablet [id number] Yes Take 2 tablets by mouth as needed. [provider] Taking Active   Blood Glucose Monitoring Suppl (ACCU-CHEK GUIDE ME) w/Device KIT [id number] Yes USE AS DIRECTED TO CHECK BLOOD SUGAR TWICE DAILY [provider] Taking Active   busPIRone (BUSPAR) 15 MG tablet [id number] Yes  Take 0.5 tablets (7.5 mg total) by mouth 2 (two) times daily.  Patient taking differently: Take 7.5 mg by  mouth daily at 12 noon. PRN   Wanda Schooner, MD Taking Active   Insulin Degludec (TRESIBA) 100 UNIT/ML SOLN [id number] Yes Inject 10 Units into the skin daily with breakfast. Wanda Schooner, MD Taking Active   metFORMIN (GLUCOPHAGE-XR) 500 MG 24 hr tablet [id number] Yes TAKE 2 TABLETS (1,000 MG TOTAL) BY MOUTH DAILY WITH BREAKFAST. Kennyth Arnold, FNP Taking Active   NEEDLE, DISP, 30 G (BD DISP NEEDLES) 30G X 1/2" MISC [id number] Yes Use as directed with insulin Wanda Schooner, MD Taking Active   omeprazole (PRILOSEC) 20 MG capsule [id number] Yes Take 1 capsule (20 mg total) by mouth every other day. Wanda Schooner, MD Taking Active   pravastatin (PRAVACHOL) 20 MG tablet [id number] Yes TAKE 1 TABLET BY MOUTH DAILY WITH EVENING MEAL Wanda Schooner, MD Taking Active   repaglinide (PRANDIN) 1 MG tablet [id number] Yes Take 1 tablet (1 mg total) by mouth 3 (three) times daily before meals. Wanda Schooner, MD Taking Active             Patient Active Problem List   Diagnosis Date Noted   Type 2 diabetes mellitus with diabetic polyneuropathy, with long-term current use of insulin ([place]) 05/19/2022   Vision changes 05/05/2022   B12 deficiency 01/24/2022   Weight loss 01/24/2022   Other fatigue 01/02/2022   Postural dizziness 05/23/2021   Generalized anxiety disorder 04/18/2021   Muscle spasm 04/18/2021   Diarrhea 01/08/2021   Osteopenia 01/01/2021   Thumb pain, left 08/21/2020   Shortness of breath 07/09/2020   Tinnitus of both ears 07/09/2020   Type 2 diabetes mellitus with hyperglycemia, without long-term current use of insulin ([place]) 10/03/2019   Type 2 diabetes mellitus with diabetic chronic kidney disease ([place]) 10/03/2019   Disease related peripheral neuropathy 08/22/2019   Urinary frequency 05/18/2019   Stress incontinence of urine 02/21/2019   Grief 07/15/2018   Pelvic prolapse 01/22/2017   Hyperlipidemia associated with type 2 diabetes mellitus ([place]) 05/03/2014   GERD (gastroesophageal  reflux disease) 12/05/2013   CKD (chronic kidney disease) stage 3, GFR 30-59 ml/min ([place]) 11/27/2011   HYPERKALEMIA 11/30/2009   INSOMNIA 11/21/2009   FATIGUE 11/21/2009   Diabetes mellitus with renal manifestation ([place]) 07/25/2009   Essential hypertension 07/25/2009   COLONIC POLYPS, HX OF 07/25/2009    Immunization History  Administered Date(s) Administered   Influenza Split 06/03/2012   Influenza,inj,Quad PF,6+ Mos 08/10/2013, 05/03/2014, 05/06/2017, 07/15/2018, 05/18/2019   PFIZER(Purple Top)SARS-COV-2 Vaccination 09/18/2019, 10/11/2019   PPD Test 04/19/2013, 04/14/2022   Pneumococcal Conjugate-13 10/02/2014   Pneumococcal Polysaccharide-23 04/25/2013   Td 08/19/2007    Conditions to be addressed/monitored:  Hypertension, Hyperlipidemia, Diabetes, GERD, Anxiety, and Osteopenia  Care Plan : [place]  Updates made by Charlton Haws, [place] since 05/29/2022 12:00 AM     Problem: Hypertension, Hyperlipidemia, Diabetes, GERD, Anxiety, and Osteopenia      Long-Range Goal: Disease mgmt   Start Date: 07/19/2021  Expected End Date: 07/19/2022  This Visit's Progress: On track  Recent Progress: On track  Priority: High  Note:   Current Barriers:  Unable to achieve control of Diabetes   Pharmacist Clinical Goal(s):  Patient will achieve improvement in diabetes as evidenced by A1c < 8% adhere to prescribed medication regimen as evidenced by pt report through collaboration with PharmD and provider.   Interventions: 1:1 collaboration with PCP regarding development and update of  comprehensive plan of care as evidenced by provider attestation and co-signature Inter-disciplinary care team collaboration (see longitudinal plan of care) Comprehensive medication review performed; medication list updated in electronic medical record  Hypertension (BP goal <130/80) -Controlled - BP fluctuates at home; pt is off medication now; Pt reports drinking plenty of water  lately -Home BP: n/a -Current treatment: None -Medications previously tried: lisinopril (low BP) -Educated on BP goals and benefits of medications for prevention of heart attack, stroke and kidney damage; Importance of home blood pressure monitoring; -Counseled to monitor BP at home weekly -Recommended to continue current medication  Hyperlipidemia: (LDL goal < 100) -Query controlled - LDL 96 (07/2020), overdue for repeat lipid panel; pt endorses compliance with statin -Current treatment: Pravastatin 20 mg daily - Appropriate, Effective, Safe, Accessible -Educated on Cholesterol goals; Benefits of statin for ASCVD risk reduction; -Recommended to continue current medication; repeat lipid panel at annual visit  Diabetes (A1c goal <8%) -Uncontrolled - A1c 8.5% (04/2022), CGM shows some improvement; she recently stopped Jardiance and started Repaglinide, as well as established with endocrine (Dr Kelton Pillar) -Previously metformin was reduced to 1000 mg/day due to diarrhea, which improved with lower dose -Dexcom training completed 03/17/22 -Reviewed AGP report: Generated at: Thu, May 29, 2022 8:37 AM EDT Reporting period: Fri May 16, 2022 - Thu May 29, 2022 ----------------------------- Glucose Details Average glucose: 175 mg/dL Standard deviation: 59 mg/dL GMI: 7.5% ----------------------------- Time in Range Very High: 10% High: 36% In Range: 52% Low: <1% Very Low: 1%  Target Range:70-180 mg/dL ----------------------------- CGM Details Sensor usage: 93% Days with CGM data: 13/14    Previous AGP report:  Generated at: Tue, Apr 29, 2022 8:44 AM EDT Reporting period: Wed Apr 16, 2022 - Tue Apr 29, 2022 Glucose Details Average glucose: 195 mg/dL Standard deviation: 62 mg/dL GMI: 8.0% ----------------------------- Time in Range Very High: 20% High: 33% In Range: 47% Low: 0% Very Low: 0%  Target Range:70-180 mg/dL ----------------------------- CGM Details Sensor  usage: 93% Days with CGM data: 13/14  -Current medications: Metformin ER 500 mg -2 tablet daily -Appropriate, Query Effective Tresiba 12 units daily -Appropriate, Query Effective Repaglinide 1 mg TID -Appropriate, Query Effective Dexcom G7 - Appropriate, Effective, Safe, Accessible -Medications previously tried: Januvia (nausea), Rybelsus (N/V), glipizide (10 years of tx), Trulicity (GI/wt loss), Jardiance (yeast infxn) -Discussed apparent improved control based on CGM report;  -Reviewed diet choices to improve post-prandial spikes (discussed snacks < 15 g carbs) -Recommend to continue current medication  Osteopenia (Goal prevent fractures) -Controlled -Last DEXA Scan: 12/25/20   T-Score femoral neck: -1.5  T-Score total hip: -0.8  T-Score lumbar spine: -0.6  10-year probability of major osteoporotic fracture: 16%  10-year probability of hip fracture: 2.6% -Patient is not a candidate for pharmacologic treatment -Current treatment  None -Recommend [phone] units of vitamin D daily. Recommend 1200 mg of calcium daily from dietary and supplemental sources. Recommend weight-bearing and muscle strengthening exercises for building and maintaining bone density.  Health Maintenance -Vaccine gaps: Flu, covid booster, Shingrix, TD booster  Patient Goals/Self-Care Activities Patient will:  - take medications as prescribed as evidenced by patient report and record review focus on medication adherence by routine heck blood pressure weekly, document, and provide at future appointments check blood sugar daily, document, and provide at future appointments      Medication Assistance: None required.  Patient affirms current coverage meets needs.  Compliance/Adherence/Medication fill history: Care Gaps: None  Star-Rating Drugs: Pravastatin - PDC 67% (LF 04/09/22 x 90 ds) Metformin -  [place] 96% (LF 04/09/22 x 30 ds) Jardiance - PDC 76% (LF 04/14/22 x 90 ds)  Medication Access: Within the past  30 days, how often has patient missed a dose of medication? 0 Is a pillbox or other method used to improve adherence? No  Factors that may affect medication adherence? adverse effects of medications Are meds synced by current pharmacy? No  Are meds delivered by current pharmacy? No  Does patient experience delays in picking up medications due to transportation concerns? No   [facility] Services Reviewed: Is patient disadvantaged to use [facility]?: No  Current Rx insurance plan: [employer] [place] Name and location of Current pharmacy:  [place], [place] [place] [address] Phone: [phone] Fax: [phone]  [facility] Pharm[facility] services reviewed with patient today?: No  Patient requests to transfer care to [facility] Pharmacy?: No  Reason patient declined to change pharmacies: Loyalty to other pharmacy/Patient preference   Care Plan and Follow Up Patient Decision:  Patient agrees to Care Plan and Follow-up.  Plan: Telephone follow up appointment with care management team member scheduled for:  4 weeks  Charlene Brooke, PharmD, BCACP Clinical Pharmacist [facility] at [facility] [phone]

## 2022-05-29 NOTE — Patient Instructions (Signed)
Visit Information  Phone number for Pharmacist: [phone]   Goals Addressed   None     Care Plan : [place]  Updates made by Charlton Haws, RPH since 05/29/2022 12:00 AM     Problem: Hypertension, Hyperlipidemia, Diabetes, GERD, Anxiety, and Osteopenia      Long-Range Goal: Disease mgmt   Start Date: 07/19/2021  Expected End Date: 07/19/2022  This Visit's Progress: On track  Recent Progress: On track  Priority: High  Note:   Current Barriers:  Unable to achieve control of Diabetes   Pharmacist Clinical Goal(s):  Patient will achieve improvement in diabetes as evidenced by A1c < 8% adhere to prescribed medication regimen as evidenced by pt report through collaboration with PharmD and provider.   Interventions: 1:1 collaboration with PCP regarding development and update of comprehensive plan of care as evidenced by provider attestation and co-signature Inter-disciplinary care team collaboration (see longitudinal plan of care) Comprehensive medication review performed; medication list updated in electronic medical record  Hypertension (BP goal <130/80) -Controlled - BP fluctuates at home; pt is off medication now; Pt reports drinking plenty of water lately -Home BP: n/a -Current treatment: None -Medications previously tried: lisinopril (low BP) -Educated on BP goals and benefits of medications for prevention of heart attack, stroke and kidney damage; Importance of home blood pressure monitoring; -Counseled to monitor BP at home weekly -Recommended to continue current medication  Hyperlipidemia: (LDL goal < 100) -Query controlled - LDL 96 (07/2020), overdue for repeat lipid panel; pt endorses compliance with statin -Current treatment: Pravastatin 20 mg daily - Appropriate, Effective, Safe, Accessible -Educated on Cholesterol goals; Benefits of statin for ASCVD risk reduction; -Recommended to continue current medication; repeat lipid panel at  annual visit  Diabetes (A1c goal <8%) -Uncontrolled - A1c 8.5% (04/2022), CGM shows some improvement; she recently stopped Jardiance and started Repaglinide, as well as established with endocrine (Dr Kelton Pillar) -Previously metformin was reduced to 1000 mg/day due to diarrhea, which improved with lower dose -Dexcom training completed 03/17/22 -Reviewed AGP report: Generated at: Thu, May 29, 2022 8:37 AM EDT Reporting period: Fri May 16, 2022 - Thu May 29, 2022 ----------------------------- Glucose Details Average glucose: 175 mg/dL Standard deviation: 59 mg/dL GMI: 7.5% ----------------------------- Time in Range Very High: 10% High: 36% In Range: 52% Low: <1% Very Low: 1%  Target Range:70-180 mg/dL ----------------------------- CGM Details Sensor usage: 93% Days with CGM data: 13/14    Previous AGP report:  Generated at: Tue, Apr 29, 2022 8:44 AM EDT Reporting period: Wed Apr 16, 2022 - Tue Apr 29, 2022 Glucose Details Average glucose: 195 mg/dL Standard deviation: 62 mg/dL GMI: 8.0% ----------------------------- Time in Range Very High: 20% High: 33% In Range: 47% Low: 0% Very Low: 0%  Target Range:70-180 mg/dL ----------------------------- CGM Details Sensor usage: 93% Days with CGM data: 13/14  -Current medications: Metformin ER 500 mg -2 tablet daily -Appropriate, Query Effective Tresiba 12 units daily -Appropriate, Query Effective Repaglinide 1 mg TID -Appropriate, Query Effective Dexcom G7 - Appropriate, Effective, Safe, Accessible -Medications previously tried: Januvia (nausea), Rybelsus (N/V), glipizide (10 years of tx), Trulicity (GI/wt loss), Jardiance (yeast infxn) -Discussed apparent improved control based on CGM report;  -Reviewed diet choices to improve post-prandial spikes (discussed snacks < 15 g carbs) -Recommend to continue current medication  Osteopenia (Goal prevent fractures) -Controlled -Last DEXA Scan: 12/25/20   T-Score femoral  neck: -1.5  T-Score total hip: -0.8  T-Score lumbar spine: -0.6  10-year probability of major osteoporotic fracture: 16%  10-year probability of hip fracture: 2.6% -Patient is not a candidate for pharmacologic treatment -Current treatment  None -Recommend [phone] units of vitamin D daily. Recommend 1200 mg of calcium daily from dietary and supplemental sources. Recommend weight-bearing and muscle strengthening exercises for building and maintaining bone density.  Health Maintenance -Vaccine gaps: Flu, covid booster, Shingrix, TD booster  Patient Goals/Self-Care Activities Patient will:  - take medications as prescribed as evidenced by patient report and record review focus on medication adherence by routine heck blood pressure weekly, document, and provide at future appointments check blood sugar daily, document, and provide at future appointments      Patient verbalizes understanding of instructions and care plan provided today and agrees to view in [place]. Active MyChart status and patient understanding of how to access instructions and care plan via MyChart confirmed with patient.    Telephone follow up appointment with pharmacy team member scheduled for: 1 month  Charlene Brooke, PharmD, [facility] Clinical Pharmacist [facility] at [facility] [phone]

## 2022-06-16 ENCOUNTER — Ambulatory Visit
Admission: RE | Admit: 2022-06-16 | Discharge: 2022-06-16 | Disposition: A | Discharge status: Home / Self Care | Payer: Medicare PPO | Source: Ambulatory Visit | Attending: Family Medicine | Admitting: Family Medicine

## 2022-06-16 ENCOUNTER — Ambulatory Visit: Payer: Medicare PPO

## 2022-06-16 DIAGNOSIS — Z1231 Encounter for screening mammogram for malignant neoplasm of breast: Secondary | ICD-10-CM

## 2022-06-30 ENCOUNTER — Telehealth: Payer: Self-pay

## 2022-06-30 NOTE — Progress Notes (Signed)
    Chronic Care Management Pharmacy Assistant   Name: Laniesha Das  MRN: [id number] DOB: 10-25-1945  Reason for Encounter: CCM (Appointment Reminder)  Medications: Outpatient Encounter Medications as of 06/30/2022  Medication Sig   Accu-Chek Softclix Lancets lancets 2 (two) times daily.   acetaminophen (TYLENOL) 325 MG tablet Take 2 tablets by mouth as needed.   Blood Glucose Monitoring Suppl (ACCU-CHEK GUIDE ME) w/Device KIT USE AS DIRECTED TO CHECK BLOOD SUGAR TWICE DAILY   busPIRone (BUSPAR) 15 MG tablet Take 0.5 tablets (7.5 mg total) by mouth 2 (two) times daily. (Patient taking differently: Take 7.5 mg by mouth daily at 12 noon. PRN)   insulin degludec (TRESIBA FLEXTOUCH) 100 UNIT/ML FlexTouch Pen Inject 12 Units into the skin daily.   Insulin Pen Needle 32G X 4 MM MISC 1 Device by Does not apply route daily in the afternoon.   metFORMIN (GLUCOPHAGE-XR) 500 MG 24 hr tablet Take 2 tablets (1,000 mg total) by mouth daily with breakfast.   NEEDLE, DISP, 30 G (BD DISP NEEDLES) 30G X 1/2" MISC Use as directed with insulin   omeprazole (PRILOSEC) 20 MG capsule Take 1 capsule (20 mg total) by mouth every other day.   pravastatin (PRAVACHOL) 20 MG tablet TAKE 1 TABLET BY MOUTH DAILY WITH EVENING MEAL   repaglinide (PRANDIN) 1 MG tablet Take 1 tablet (1 mg total) by mouth 3 (three) times daily before meals.   No facility-administered encounter medications on file as of 06/30/2022.   Dylana Shaw was contacted to remind of upcoming telephone visit with Charlene Brooke on 07/01/2022 at 1:30. Patient was reminded to have any blood glucose and blood pressure readings available for review at appointment.   Message was left reminding patient of appointment.  CCM referral has been placed prior to visit?  Yes    Star Rating Drugs: Medication:                Last Fill:         Day Supply Metformin 500 mg       05/13/22          90 Pravastatin 20 mg       04/09/22          [place], CPP notified   Marijean Niemann, [place] Clinical Pharmacy Assistant [phone]

## 2022-07-01 ENCOUNTER — Ambulatory Visit: Payer: Medicare PPO | Admitting: Pharmacist

## 2022-07-01 DIAGNOSIS — E1169 Type 2 diabetes mellitus with other specified complication: Secondary | ICD-10-CM

## 2022-07-01 DIAGNOSIS — N1831 Chronic kidney disease, stage 3a: Secondary | ICD-10-CM

## 2022-07-01 NOTE — Progress Notes (Signed)
Chronic Care Management Pharmacy Note  07/01/22 Name:  Wanda [name] MRN:  [id number] DOB:  [birth year]  Summary: CCM F/U visit -DM: A1c 8.5% (04/2022); pt is compliant with medications Reviewed AGP report: 06/18/22 to 07/01/22. Sensor active: 100%  Time in range (70-180): 48% (goal > 70%)  High (180-250): 38%  Very high (>250): 14%  Low (< 70): 0% (goal < 4%)  GMI: 7.8%; Average glucose: 188 -Reviewed diet choices to improve post-prandial spikes; pt typically eats 2 meals a day with "large snacks" in between, she it taking repaglinide with meals only  Recommendations/Changes made from today's visit: -Advised to take repaglinide with "large snacks" as well - repaglinide can be taken up to 4x daily   Plan: -Pharmacist follow up televisit scheduled for 3 months -PCP appt 08/19/22 (TOC - Dr Diona Browner)    Subjective: Wanda [name] is an 76 y.o. year old female who is a primary patient of [name], MD.  The CCM team was consulted for assistance with disease management and care coordination needs.    Engaged with patient by telephone for follow up visit in response to provider referral for pharmacy case management and/or care coordination services.   Consent to Services:  The patient was given information about Chronic Care Management services, agreed to services, and gave verbal consent prior to initiation of services.  Please see initial visit note for detailed documentation.   Patient Care Team: [name], MD as PCP - General (Family Medicine) Katy Apo, MD as Consulting Physician (Ophthalmology) [facility], Melanie Crazier, MD as Consulting Physician (Endocrinology) Willia Craze, NP as Nurse Practitioner (Gastroenterology) Charlton Haws, [facility] as Pharmacist (Pharmacist)  Recent office visits: 05/05/22 Dr Einar Pheasant OV: DM - referred to endocrine. D/C Jardiance. Start Prandin 1 mg TID w/ meals.  04/14/22 Dr Einar Pheasant OV: annual - A1c 8.5%;   03/03/22 TE - start Tyler Aas,  stop trulicity.  01/24/22 Dr Einar Pheasant OV: f/u DM - A1c 0.3%; increase Trulicity to 3 mg. F/u 3 months. Encouraged Buspar BID as prescribed. Referred for therapy. Pt lost 8-10 lbs. Declined nutrition referral.  05/23/2021 - [name], MD - Patient presented for follow up for diabetes. Continue Buspar 7.5 mg BID due to significant improvement. Change: Decrease Lisinopril 10 > 54m due to orthostatic hypotension.   04/18/2021 - [name] MD - Patient presented for diabetes follow up. Referral to Gastroenterology due to nausea and difficulty swallowing. Change: switch glipizide 5 mg bid to glipizide XL 10 mg daily. Change: Increase jardiance to 25 mg. Change: Increase buspar 3.75 > 7.5 mg and to 15 mg daily if no improvement after 2 weeks for anxiety.   Recent consult visits: 05/19/22 Dr SKelton Pillar(Endocrine): Increase Tresiba to 12 units. Take Prandin 20-30 min before eating.   01/23/22 PA Amy Esterwood (GI): weight loss - 6-7 mo hx of diarrhea. Decrease in appetite 3-4 mos. Asked pt to stop Trulicity - 1 month trial off.  07/23/21 Dr SCandiss Norse(Nephrology): f/u CKD. Stable. No changes. F/U 1 year.  04/02/2021 - SJohny Drilling RN - Nutrition - Patient presented for diabetes. Recommended: Check blood sugars 1 x day before breakfast or 2 hrs after supper every day.   02/25/2021 - TCasimiro Needle RN - Urology - Telephone - Start: cephalexin ([facility] 500 MG capsule for 5 days.   02/20/2021 - CMaryland Pink- Urology - Patient presented for urinary frequency and prolapse of vaginal wall. No other information provided.   02/19/2021 - SJohny Drilling RN - Nutrition - Patient presented  for diabetes. Recommended: Eat 3 meals day, 1-2  snacks a day, space meals 4-6 hours apart, don't skip meals and limit fried foods, desserts and sweets. Complete 3 Day Food Record and bring to next appt.   Hospital visits: None in previous 6 months   Objective:  Lab Results  Component Value Date   CREATININE  1.00 04/28/2022   BUN 22 04/28/2022   GFR 55.51 (L) 04/28/2022   GFRNONAA 57 (L) 06/23/2014   GFRAA 67 (L) 06/23/2014   NA 143 04/28/2022   K 4.1 04/28/2022   CALCIUM 9.2 04/28/2022   CO2 25 04/28/2022   GLUCOSE 130 (H) 04/28/2022    Lab Results  Component Value Date/Time   HGBA1C 8.5 (H) 04/28/2022 09:09 AM   HGBA1C 8.5 (A) 01/24/2022 10:26 AM   HGBA1C 9.6 (A) 10/11/2021 10:49 AM   HGBA1C 9.3 (H) 07/06/2020 02:33 PM   HGBA1C 7.7 08/25/2018 10:28 AM   GFR 55.51 (L) 04/28/2022 09:09 AM   GFR 49.62 (L) 01/02/2022 10:04 AM   MICROALBUR 1.3 10/02/2014 03:01 PM   MICROALBUR 1.9 08/05/2013 09:20 AM    Last diabetic Eye exam:  Lab Results  Component Value Date/Time   HMDIABEYEEXA No Retinopathy 11/01/2021 12:00 AM    Last diabetic Foot exam: No results found for: "HMDIABFOOTEX"   Lab Results  Component Value Date   CHOL 139 04/28/2022   HDL 43.70 04/28/2022   LDLCALC 75 04/28/2022   LDLDIRECT 96.0 07/06/2020   TRIG 100.0 04/28/2022   CHOLHDL 3 04/28/2022       Latest Ref Rng & Units 01/02/2022   10:04 AM 07/16/2021   12:00 AM 07/06/2020    2:33 PM  Hepatic Function  Total Protein 6.0 - 8.3 g/dL 7.3   7.4   Albumin 3.5 - 5.2 g/dL 4.2  4.3     4.2   AST 0 - 37 U/L 18   32   ALT 0 - 35 U/L 17   34   Alk Phosphatase 39 - 117 U/L 90   90   Total Bilirubin 0.2 - 1.2 mg/dL 0.5   0.4      This result is from an external source.    Lab Results  Component Value Date/Time   TSH 3.43 01/02/2022 10:04 AM   TSH 3.57 08/23/2019 02:36 PM       Latest Ref Rng & Units 01/02/2022   10:04 AM 07/16/2021   12:00 AM 07/06/2020    2:33 PM  CBC  WBC 4.0 - 10.5 K/uL 6.8  7.2     7.8   Hemoglobin 12.0 - 15.0 g/dL 13.0  13.2     12.8   Hematocrit 36.0 - 46.0 % 40.4  40     38.8   Platelets 150.0 - 400.0 K/uL 274.0  283     291.0      This result is from an external source.    Lab Results  Component Value Date/Time   VD25OH 21.70 (L) 08/23/2019 02:36 PM    Clinical ASCVD: No   The 10-year ASCVD risk score (Arnett DK, et al., 2019) is: 27.8%   Values used to calculate the score:     Age: 67 years     Sex: Female     Is Non-Hispanic African American: No     Diabetic: Yes     Tobacco smoker: No     Systolic Blood Pressure: 937 mmHg     Is BP treated: No     HDL  Cholesterol: 43.7 mg/dL     Total Cholesterol: 139 mg/dL       12/17/2021    2:51 PM 05/23/2021   12:52 PM 02/19/2021    2:04 PM  Depression screen PHQ 2/9  Decreased Interest 0 1 0  Down, Depressed, Hopeless 0 0 0  PHQ - 2 Score 0 1 0  Altered sleeping  1   Tired, decreased energy  1   Change in appetite  1   Feeling bad or failure about yourself   0   Trouble concentrating  0   Moving slowly or fidgety/restless  0   Suicidal thoughts  0   PHQ-9 Score  4   Difficult doing work/chores  Not difficult at all      Social History   Tobacco Use  Smoking Status Never  Smokeless Tobacco Never   BP Readings from Last 3 Encounters:  05/19/22 136/80  05/05/22 120/60  04/14/22 120/70   Pulse Readings from Last 3 Encounters:  05/19/22 74  05/05/22 73  04/14/22 71   Wt Readings from Last 3 Encounters:  05/19/22 134 lb (60.8 kg)  05/05/22 129 lb 4 oz (58.6 kg)  04/14/22 126 lb 4 oz (57.3 kg)   BMI Readings from Last 3 Encounters:  05/19/22 22.65 kg/m  05/05/22 21.84 kg/m  04/14/22 21.34 kg/m    Assessment/Interventions: Review of patient past medical history, allergies, medications, health status, including review of consultants reports, laboratory and other test data, was performed as part of comprehensive evaluation and provision of chronic care management services.   SDOH:  (Social Determinants of Health) assessments and interventions performed: No SDOH Interventions    Flowsheet Row Clinical Support from 12/17/2021 in [place] at [place] Management from 07/19/2021 in [place] at [facility] Visit from 04/18/2021 in [place]  at [place] Interventions Intervention Not Indicated -- --  Housing Interventions Intervention Not Indicated -- --  Transportation Interventions Intervention Not Indicated -- --  Depression Interventions/Treatment  -- -- Medication  Financial Strain Interventions Intervention Not Indicated Intervention Not Indicated --  Physical Activity Interventions Intervention Not Indicated -- --  Stress Interventions Intervention Not Indicated -- --  Social Connections Interventions Intervention Not Indicated -- --       SDOH Screenings   Food Insecurity: No Food Insecurity (12/17/2021)  Housing: Low Risk  (12/17/2021)  Transportation Needs: No Transportation Needs (12/17/2021)  Alcohol Screen: Low Risk  (12/17/2021)  Depression (PHQ2-9): Low Risk  (12/17/2021)  Financial Resource Strain: Low Risk  (12/17/2021)  Physical Activity: Unknown (12/17/2021)  Social Connections: Moderately Integrated (12/17/2021)  Stress: No Stress Concern Present (12/17/2021)  Tobacco Use: Low Risk  (05/19/2022)    CCM Care Plan  Allergies  Allergen Reactions   Niacin     REACTION: unbearable hot flashes   Sulfamethoxazole-Trimethoprim Itching    Medications Reviewed Today     Reviewed by Jefferson Fuel, CMA (Certified Medical Assistant) on 05/19/22 at 1417  Med List Status: <None>   Medication Order Taking? Sig Documenting Provider Last Dose Status Informant  Accu-Chek Softclix Lancets lancets [id number] Yes 2 (two) times daily. [provider] Taking Active   acetaminophen (TYLENOL) 325 MG tablet [id number] Yes Take 2 tablets by mouth as needed. [provider] Taking Active   Blood Glucose Monitoring Suppl (ACCU-CHEK GUIDE ME) w/Device KIT [id number] Yes USE AS DIRECTED TO CHECK BLOOD SUGAR TWICE DAILY [provider] Taking Active  busPIRone (BUSPAR) 15 MG tablet [id number] Yes Take 0.5 tablets (7.5 mg total) by mouth 2 (two) times daily.   Patient taking differently: Take 7.5 mg by mouth daily at 12 noon. PRN   [name], MD Taking Active   Insulin Degludec (TRESIBA) 100 UNIT/ML SOLN [id number] Yes Inject 10 Units into the skin daily with breakfast. [name], MD Taking Active   metFORMIN (GLUCOPHAGE-XR) 500 MG 24 hr tablet [id number] Yes TAKE 2 TABLETS (1,000 MG TOTAL) BY MOUTH DAILY WITH BREAKFAST. Kennyth Arnold, FNP Taking Active   NEEDLE, DISP, 30 G (BD DISP NEEDLES) 30G X 1/2" MISC [id number] Yes Use as directed with insulin [name], MD Taking Active   omeprazole (PRILOSEC) 20 MG capsule [id number] Yes Take 1 capsule (20 mg total) by mouth every other day. [name], MD Taking Active   pravastatin (PRAVACHOL) 20 MG tablet [id number] Yes TAKE 1 TABLET BY MOUTH DAILY WITH EVENING MEAL [name], MD Taking Active   repaglinide (PRANDIN) 1 MG tablet [id number] Yes Take 1 tablet (1 mg total) by mouth 3 (three) times daily before meals. [name], MD Taking Active             Patient Active Problem List   Diagnosis Date Noted   Type 2 diabetes mellitus with diabetic polyneuropathy, with long-term current use of insulin ([place]) 05/19/2022   Vision changes 05/05/2022   B12 deficiency 01/24/2022   Weight loss 01/24/2022   Other fatigue 01/02/2022   Postural dizziness 05/23/2021   Generalized anxiety disorder 04/18/2021   Muscle spasm 04/18/2021   Diarrhea 01/08/2021   Osteopenia 01/01/2021   Thumb pain, left 08/21/2020   Shortness of breath 07/09/2020   Tinnitus of both ears 07/09/2020   Type 2 diabetes mellitus with hyperglycemia, without long-term current use of insulin ([place]) 10/03/2019   Type 2 diabetes mellitus with diabetic chronic kidney disease ([place]) 10/03/2019   Disease related peripheral neuropathy 08/22/2019   Urinary frequency 05/18/2019   Stress incontinence of urine 02/21/2019   Grief 07/15/2018   Pelvic prolapse 01/22/2017   Hyperlipidemia associated with type 2 diabetes mellitus  ([place]) 05/03/2014   GERD (gastroesophageal reflux disease) 12/05/2013   CKD (chronic kidney disease) stage 3, GFR 30-59 ml/min ([place]) 11/27/2011   HYPERKALEMIA 11/30/2009   INSOMNIA 11/21/2009   FATIGUE 11/21/2009   Diabetes mellitus with renal manifestation ([place]) 07/25/2009   Essential hypertension 07/25/2009   COLONIC POLYPS, HX OF 07/25/2009    Immunization History  Administered Date(s) Administered   Influenza Split 06/03/2012   Influenza,inj,Quad PF,6+ Mos 08/10/2013, 05/03/2014, 05/06/2017, 07/15/2018, 05/18/2019   PFIZER(Purple Top)SARS-COV-2 Vaccination 09/18/2019, 10/11/2019   PPD Test 04/19/2013, 04/14/2022   Pneumococcal Conjugate-13 10/02/2014   Pneumococcal Polysaccharide-23 04/25/2013   Td 08/19/2007    Conditions to be addressed/monitored:  Hypertension, Hyperlipidemia, Diabetes, GERD, Anxiety, and Osteopenia  Care Plan : [place]  Updates made by Charlton Haws, [place] since 07/01/2022 12:00 AM     Problem: Hypertension, Hyperlipidemia, Diabetes, GERD, Anxiety, and Osteopenia      Long-Range Goal: Disease mgmt   Start Date: 07/19/2021  Expected End Date: 07/19/2022  Recent Progress: On track  Priority: High  Note:   Current Barriers:  Unable to achieve control of Diabetes   Pharmacist Clinical Goal(s):  Patient will achieve improvement in diabetes as evidenced by A1c < 8% adhere to prescribed medication regimen as evidenced by pt report through collaboration with PharmD and provider.   Interventions: 1:1 collaboration with PCP regarding development and update  of comprehensive plan of care as evidenced by provider attestation and co-signature Inter-disciplinary care team collaboration (see longitudinal plan of care) Comprehensive medication review performed; medication list updated in electronic medical record  Hypertension (BP goal <130/80) -Controlled - BP fluctuates at home; pt is off medication now; Pt reports drinking plenty of  water lately -Home BP: n/a -Current treatment: None -Medications previously tried: lisinopril (low BP) -Educated on BP goals and benefits of medications for prevention of heart attack, stroke and kidney damage; Importance of home blood pressure monitoring; -Counseled to monitor BP at home weekly -Recommended to continue current medication  Hyperlipidemia: (LDL goal < 100) -controlled - LDL 75 (04/2022),  pt endorses compliance with statin -Current treatment: Pravastatin 20 mg daily - Appropriate, Effective, Safe, Accessible -Educated on Cholesterol goals; Benefits of statin for ASCVD risk reduction; -Recommended to continue current medication  Diabetes (A1c goal <8%) -Uncontrolled, improving - A1c 8.5% (04/2022), improved from 9.6% previously -Previously metformin was reduced to 1000 mg/day due to diarrhea, which improved with lower dose -Follows with endocrine (Dr Kelton Pillar - established care 05/2022) -Dexcom training completed 03/17/22 -Reviewed AGP report: 06/18/22 to 07/01/22. Sensor active: 100%  Time in range (70-180): 48% (goal > 70%)  High (180-250): 38%  Very high (>250): 14%  Low (< 70): 0% (goal < 4%)  GMI: 7.8%; Average glucose: 188  -Previous AGP report: Generated at: Thu, May 29, 2022 8:37 AM EDT Reporting period: Fri May 16, 2022 - Thu May 29, 2022 ----------------------------- Glucose Details Average glucose: 175 mg/dL Standard deviation: 59 mg/dL GMI: 7.5% ----------------------------- Time in Range Very High: 10% High: 36% In Range: 52% Low: <1% Very Low: 1%    -Current medications: Metformin ER 500 mg -2 tablet daily -Appropriate, Query Effective Tresiba 12 units daily -Appropriate, Query Effective Repaglinide 1 mg TID -Appropriate, Query Effective Dexcom G7 - Appropriate, Effective, Safe, Accessible -Medications previously tried: Januvia (nausea), Rybelsus (N/V), glipizide (10 years of tx), Trulicity (GI/wt loss), Jardiance (yeast infxn) -Diet: pt  reports she typically eats 2 meals/day with "large snack", but meals are irregularly timed -Pt is frustrated that glucose will increase in AM before she eats anything; discussed role of liver and hormones in glucose production/turnover independent of eating food -Reviewed diet choices to improve post-prandial spikes (discussed snacks < 15 g carbs) -Recommend to continue current medication; advised to take Repaglinide with meals and large snacks   Osteopenia (Goal prevent fractures) -Controlled -Last DEXA Scan: 12/25/20   T-Score femoral neck: -1.5  T-Score total hip: -0.8  T-Score lumbar spine: -0.6  10-year probability of major osteoporotic fracture: 16%  10-year probability of hip fracture: 2.6% -Patient is not a candidate for pharmacologic treatment -Current treatment  None -Recommend [phone] units of vitamin D daily. Recommend 1200 mg of calcium daily from dietary and supplemental sources. Recommend weight-bearing and muscle strengthening exercises for building and maintaining bone density.  Health Maintenance -Vaccine gaps: Flu, covid booster, Shingrix, TD booster  Patient Goals/Self-Care Activities Patient will:  - take medications as prescribed as evidenced by patient report and record review focus on medication adherence by routine heck blood pressure weekly, document, and provide at future appointments check blood sugar via Dexcom    Medication Assistance: None required.  Patient affirms current coverage meets needs.  Compliance/Adherence/Medication fill history: Care Gaps: None  Star-Rating Drugs: Pravastatin - PDC 98% (LF 04/09/22 x 90 ds) Metformin - PDC 96% (LF 05/13/22 x 30 ds) Repaglinide - PDC 80% (LF 06/16/22 x 30 ds)  Medication Access: Within  the past 30 days, how often has patient missed a dose of medication? 0 Is a pillbox or other method used to improve adherence? No  Factors that may affect medication adherence? adverse effects of medications Are meds  synced by current pharmacy? No  Are meds delivered by current pharmacy? No  Does patient experience delays in picking up medications due to transportation concerns? No   [facility] Services Reviewed: Is patient disadvantaged to use [facility]?: No  Current Rx insurance plan: [employer] [place] Name and location of Current pharmacy:  [place], [address] [place] [address] Phone: [phone] Fax: [phone]  [facility] Pharm[facility] services reviewed with patient today?: No  Patient requests to transfer care to [facility] Pharmacy?: No  Reason patient declined to change pharmacies: Loyalty to other pharmacy/Patient preference   Care Plan and Follow Up Patient Decision:  Patient agrees to Care Plan and Follow-up.  Plan: Telephone follow up appointment with care management team member scheduled for:  3 months  Charlene Brooke, PharmD, BCACP Clinical Pharmacist [facility] at [facility] [phone]

## 2022-07-01 NOTE — Patient Instructions (Signed)
Visit Information  Phone number for Pharmacist: [phone]   Goals Addressed   None     Care Plan : [place]  Updates made by Wanda [name], RPH since 07/01/2022 12:00 AM     Problem: Hypertension, Hyperlipidemia, Diabetes, GERD, Anxiety, and Osteopenia      Long-Range Goal: Disease mgmt   Start Date: 07/19/2021  Expected End Date: 07/19/2022  Recent Progress: On track  Priority: High  Note:   Current Barriers:  Unable to achieve control of Diabetes   Pharmacist Clinical Goal(s):  Patient will achieve improvement in diabetes as evidenced by A1c < 8% adhere to prescribed medication regimen as evidenced by pt report through collaboration with PharmD and provider.   Interventions: 1:1 collaboration with PCP regarding development and update of comprehensive plan of care as evidenced by provider attestation and co-signature Inter-disciplinary care team collaboration (see longitudinal plan of care) Comprehensive medication review performed; medication list updated in electronic medical record  Hypertension (BP goal <130/80) -Controlled - BP fluctuates at home; pt is off medication now; Pt reports drinking plenty of water lately -Home BP: n/a -Current treatment: None -Medications previously tried: lisinopril (low BP) -Educated on BP goals and benefits of medications for prevention of heart attack, stroke and kidney damage; Importance of home blood pressure monitoring; -Counseled to monitor BP at home weekly -Recommended to continue current medication  Hyperlipidemia: (LDL goal < 100) -controlled - LDL 75 (04/2022),  pt endorses compliance with statin -Current treatment: Pravastatin 20 mg daily - Appropriate, Effective, Safe, Accessible -Educated on Cholesterol goals; Benefits of statin for ASCVD risk reduction; -Recommended to continue current medication  Diabetes (A1c goal <8%) -Uncontrolled, improving - A1c 8.5% (04/2022), improved from 9.6%  previously -Previously metformin was reduced to 1000 mg/day due to diarrhea, which improved with lower dose -Follows with endocrine (Dr Kelton Pillar - established care 05/2022) -Dexcom training completed 03/17/22 -Reviewed AGP report: 06/18/22 to 07/01/22. Sensor active: 100%  Time in range (70-180): 48% (goal > 70%)  High (180-250): 38%  Very high (>250): 14%  Low (< 70): 0% (goal < 4%)  GMI: 7.8%; Average glucose: 188  -Previous AGP report: Generated at: Thu, May 29, 2022 8:37 AM EDT Reporting period: Fri May 16, 2022 - Thu May 29, 2022 ----------------------------- Glucose Details Average glucose: 175 mg/dL Standard deviation: 59 mg/dL GMI: 7.5% ----------------------------- Time in Range Very High: 10% High: 36% In Range: 52% Low: <1% Very Low: 1%    -Current medications: Metformin ER 500 mg -2 tablet daily -Appropriate, Query Effective Tresiba 12 units daily -Appropriate, Query Effective Repaglinide 1 mg TID -Appropriate, Query Effective Dexcom G7 - Appropriate, Effective, Safe, Accessible -Medications previously tried: Januvia (nausea), Rybelsus (N/V), glipizide (10 years of tx), Trulicity (GI/wt loss), Jardiance (yeast infxn) -Diet: pt reports she typically eats 2 meals/day with "large snack", but meals are irregularly timed -Pt is frustrated that glucose will increase in AM before she eats anything; discussed role of liver and hormones in glucose production/turnover independent of eating food -Reviewed diet choices to improve post-prandial spikes (discussed snacks < 15 g carbs) -Recommend to continue current medication; advised to take Repaglinide with meals and large snacks   Osteopenia (Goal prevent fractures) -Controlled -Last DEXA Scan: 12/25/20   T-Score femoral neck: -1.5  T-Score total hip: -0.8  T-Score lumbar spine: -0.6  10-year probability of major osteoporotic fracture: 16%  10-year probability of hip fracture: 2.6% -Patient is not a candidate for  pharmacologic treatment -Current treatment  None -Recommend [phone]  units of vitamin D daily. Recommend 1200 mg of calcium daily from dietary and supplemental sources. Recommend weight-bearing and muscle strengthening exercises for building and maintaining bone density.  Health Maintenance -Vaccine gaps: Flu, covid booster, Shingrix, TD booster  Patient Goals/Self-Care Activities Patient will:  - take medications as prescribed as evidenced by patient report and record review focus on medication adherence by routine heck blood pressure weekly, document, and provide at future appointments check blood sugar via Dexcom      Patient verbalizes understanding of instructions and care plan provided today and agrees to view in [place]. Active MyChart status and patient understanding of how to access instructions and care plan via MyChart confirmed with patient.    Telephone follow up appointment with pharmacy team member scheduled for: 3 months  Wanda [name], PharmD, [facility] Clinical Pharmacist [facility] [phone]

## 2022-08-19 ENCOUNTER — Ambulatory Visit (HOSPITAL_COMMUNITY): Payer: Medicare PPO

## 2022-08-19 ENCOUNTER — Encounter: Payer: Self-pay | Admitting: Family Medicine

## 2022-08-19 ENCOUNTER — Ambulatory Visit (INDEPENDENT_AMBULATORY_CARE_PROVIDER_SITE_OTHER)
Admission: RE | Admit: 2022-08-19 | Discharge: 2022-08-19 | Disposition: A | Discharge status: Home / Self Care | Payer: Medicare PPO | Source: Ambulatory Visit | Attending: Family Medicine | Admitting: Family Medicine

## 2022-08-19 ENCOUNTER — Ambulatory Visit: Payer: Medicare PPO | Admitting: Family Medicine

## 2022-08-19 ENCOUNTER — Telehealth: Payer: Self-pay | Admitting: Internal Medicine

## 2022-08-19 VITALS — BP 138/72 | HR 79 | Temp 98.0°F | Resp 16 | Ht 64.5 in | Wt 139.2 lb

## 2022-08-19 DIAGNOSIS — E1143 Type 2 diabetes mellitus with diabetic autonomic (poly)neuropathy: Secondary | ICD-10-CM

## 2022-08-19 DIAGNOSIS — Z794 Long term (current) use of insulin: Secondary | ICD-10-CM

## 2022-08-19 DIAGNOSIS — F411 Generalized anxiety disorder: Secondary | ICD-10-CM

## 2022-08-19 DIAGNOSIS — E538 Deficiency of other specified B group vitamins: Secondary | ICD-10-CM | POA: Diagnosis not present

## 2022-08-19 DIAGNOSIS — E1122 Type 2 diabetes mellitus with diabetic chronic kidney disease: Secondary | ICD-10-CM

## 2022-08-19 DIAGNOSIS — E1169 Type 2 diabetes mellitus with other specified complication: Secondary | ICD-10-CM

## 2022-08-19 DIAGNOSIS — E1121 Type 2 diabetes mellitus with diabetic nephropathy: Secondary | ICD-10-CM

## 2022-08-19 DIAGNOSIS — M542 Cervicalgia: Secondary | ICD-10-CM | POA: Diagnosis not present

## 2022-08-19 DIAGNOSIS — K219 Gastro-esophageal reflux disease without esophagitis: Secondary | ICD-10-CM | POA: Diagnosis not present

## 2022-08-19 DIAGNOSIS — N819 Female genital prolapse, unspecified: Secondary | ICD-10-CM

## 2022-08-19 DIAGNOSIS — E785 Hyperlipidemia, unspecified: Secondary | ICD-10-CM

## 2022-08-19 DIAGNOSIS — I152 Hypertension secondary to endocrine disorders: Secondary | ICD-10-CM

## 2022-08-19 DIAGNOSIS — E1159 Type 2 diabetes mellitus with other circulatory complications: Secondary | ICD-10-CM

## 2022-08-19 DIAGNOSIS — E875 Hyperkalemia: Secondary | ICD-10-CM

## 2022-08-19 DIAGNOSIS — N1831 Chronic kidney disease, stage 3a: Secondary | ICD-10-CM

## 2022-08-19 MED ORDER — CYANOCOBALAMIN 1000 MCG/ML IJ SOLN
1000.0000 ug | Freq: Once | INTRAMUSCULAR | Status: AC
  Administered 2022-08-19: 1000 ug via INTRAMUSCULAR

## 2022-08-19 NOTE — Patient Instructions (Addendum)
Given B12 shot today. Start B12 1000 mg daily.  We will call with X-ray result.  Can use tylenol ES 2 capsules twice daily as needed.

## 2022-08-19 NOTE — Assessment & Plan Note (Addendum)
Chronic, Recently established with Dr. Kelton Pillar. On metfomrin xr 2 tablets twice daily, Tresiba new start insulin in 05/2022. 12 units daily. Prandin 1 mg three times daily Was trulicity stopped given weight loss.

## 2022-08-19 NOTE — Assessment & Plan Note (Signed)
Well controlled on no medication  

## 2022-08-19 NOTE — Telephone Encounter (Signed)
Patient is calling to say that she would like for someone to look at her Clarity report.  Her Dexcom is connected to the office.  She has scheduled an appointment and is on the waitlist for cancellation.

## 2022-08-19 NOTE — Assessment & Plan Note (Signed)
S/P surgery to repair in last 2 years.

## 2022-08-19 NOTE — Assessment & Plan Note (Addendum)
Followed by renal, Dr. Candiss Norse.

## 2022-08-19 NOTE — Assessment & Plan Note (Signed)
Stable, chronic.  Continue current medication.  Pravastatin 20 mg daily

## 2022-08-19 NOTE — Assessment & Plan Note (Signed)
Associated with DM, no treatment required as it is mild.

## 2022-08-19 NOTE — Progress Notes (Signed)
Patient ID: Wanda [name], female    DOB: [birth year], 77 y.o.   MRN: [id number]  This visit was conducted in person.  BP 138/72   Pulse 79   Temp 98 F (36.7 C)   Resp 16   Ht 5' 4.5" (1.638 m)   Wt 139 lb 4 oz (63.2 kg)   SpO2 99%   BMI 23.53 kg/m    CC:  Chief Complaint  Patient presents with   Establish Care    Transferring from Dr. Einar Pheasant    Subjective:   HPI: Wanda [name] is a 77 y.o. female presenting on 08/19/2022 for [place] (Transferring from Dr. Einar Pheasant)  Previous PCP: Einar Pheasant Last physical: September 2023. Previous labs reviewed in detail.   Hypertension: No longer on lisinopril  Using medication without problems or lightheadedness:  none Chest pain with exertion: none Edema:none Short of breath: none Average home BPs: Other issues:  Diabetes:  Recently established with Dr. Kelton Pillar. On Metfomrin xr 2 tablets twice daily, Tresiba new start insulin in 05/2022. 12 units daily. Prandin 1 mg three times daily Was Trulicity stopped given weight loss. Lab Results  Component Value Date   HGBA1C 8.5 (H) 04/28/2022  Using medications without difficulties: Hypoglycemic episodes: none Hyperglycemic episodes: 20-400 after lunch Feet problems: none Blood Sugars averaging: Dexcom: FBS 111 eye exam within last year: yes Associated with neuropathy and   Elevated Cholesterol:  At goal LDL <100.Marland Kitchen on pravastatin 20 mg daily. Lab Results  Component Value Date   CHOL 139 04/28/2022   HDL 43.70 04/28/2022   LDLCALC 75 04/28/2022   LDLDIRECT 96.0 07/06/2020   TRIG 100.0 04/28/2022   CHOLHDL 3 04/28/2022  Using medications without problems: Muscle aches:  Diet compliance: moderate Exercise: off and on Other complaints:   Pain in posterior neck, off and on for 2 month.  No pain into arms. Vicks and tylenol helps some. No fall  Reviewed X-ray 2012.  Patient Care Team: Jinny Sanders, MD as PCP - General (Family Medicine) Katy Apo, MD as Consulting  Physician (Ophthalmology)  [facility], Wanda Crazier, MD as Consulting Physician (Endocrinology) Willia Craze, NP as Nurse Practitioner (Gastroenterology) Charlton Haws, [facility] as Pharmacist (Pharmacist)    Relevant past medical, surgical, family and social history reviewed and updated as indicated. Interim medical history since our last visit reviewed. Allergies and medications reviewed and updated. Outpatient Medications Prior to Visit  Medication Sig Dispense Refill   Accu-Chek Softclix Lancets lancets 2 (two) times daily.     acetaminophen (TYLENOL) 325 MG tablet Take 2 tablets by mouth as needed.     Blood Glucose Monitoring Suppl (ACCU-CHEK GUIDE ME) w/Device KIT USE AS DIRECTED TO CHECK BLOOD SUGAR TWICE DAILY     busPIRone (BUSPAR) 15 MG tablet Take 0.5 tablets (7.5 mg total) by mouth 2 (two) times daily. (Patient taking differently: Take 7.5 mg by mouth daily at 12 noon. PRN) 90 tablet 1   insulin degludec (TRESIBA FLEXTOUCH) 100 UNIT/ML FlexTouch Pen Inject 12 Units into the skin daily. 15 mL 3   Insulin Pen Needle 32G X 4 MM MISC 1 Device by Does not apply route daily in the afternoon. 100 each 3   metFORMIN (GLUCOPHAGE-XR) 500 MG 24 hr tablet Take 2 tablets (1,000 mg total) by mouth daily with breakfast. 180 tablet 3   NEEDLE, DISP, 30 G (BD DISP NEEDLES) 30G X 1/2" MISC Use as directed with insulin 100 each 0   omeprazole (PRILOSEC) 20 MG capsule Take 1  capsule (20 mg total) by mouth every other day. 90 capsule 1   pravastatin (PRAVACHOL) 20 MG tablet TAKE 1 TABLET BY MOUTH DAILY WITH EVENING MEAL 90 tablet 3   repaglinide (PRANDIN) 1 MG tablet Take 1 tablet (1 mg total) by mouth 3 (three) times daily before meals. 270 tablet 1   No facility-administered medications prior to visit.     Per HPI unless specifically indicated in ROS section below Review of Systems  Constitutional:  Negative for fatigue and fever.  HENT:  Negative for congestion.   Eyes:  Negative for  pain.  Respiratory:  Negative for cough and shortness of breath.   Cardiovascular:  Negative for chest pain, palpitations and leg swelling.  Gastrointestinal:  Negative for abdominal pain.  Genitourinary:  Negative for dysuria and vaginal bleeding.  Musculoskeletal:  Negative for back pain.  Neurological:  Negative for syncope, light-headedness and headaches.  Psychiatric/Behavioral:  Negative for dysphoric mood.    Objective:  BP 138/72   Pulse 79   Temp 98 F (36.7 C)   Resp 16   Ht 5' 4.5" (1.638 m)   Wt 139 lb 4 oz (63.2 kg)   SpO2 99%   BMI 23.53 kg/m   Wt Readings from Last 3 Encounters:  08/19/22 139 lb 4 oz (63.2 kg)  05/19/22 134 lb (60.8 kg)  05/05/22 129 lb 4 oz (58.6 kg)      Physical Exam Constitutional:      General: She is not in acute distress.    Appearance: Normal appearance. She is well-developed. She is not ill-appearing or toxic-appearing.  HENT:     Head: Normocephalic.     Right Ear: Hearing, tympanic membrane, ear canal and external ear normal. Tympanic membrane is not erythematous, retracted or bulging.     Left Ear: Hearing, tympanic membrane, ear canal and external ear normal. Tympanic membrane is not erythematous, retracted or bulging.     Nose: No mucosal edema or rhinorrhea.     Right Sinus: No maxillary sinus tenderness or frontal sinus tenderness.     Left Sinus: No maxillary sinus tenderness or frontal sinus tenderness.     Mouth/Throat:     Pharynx: Uvula midline.  Eyes:     General: Lids are normal. Lids are everted, no foreign bodies appreciated.     Conjunctiva/sclera: Conjunctivae normal.     Pupils: Pupils are equal, round, and reactive to light.  Neck:     Thyroid: No thyroid mass or thyromegaly.     Vascular: No carotid bruit.     Trachea: Trachea normal.  Cardiovascular:     Rate and Rhythm: Normal rate and regular rhythm.     Pulses: Normal pulses.     Heart sounds: Normal heart sounds, S1 normal and S2 normal. No murmur  heard.    No friction rub. No gallop.  Pulmonary:     Effort: Pulmonary effort is normal. No tachypnea or respiratory distress.     Breath sounds: Normal breath sounds. No decreased breath sounds, wheezing, rhonchi or rales.  Abdominal:     General: Bowel sounds are normal.     Palpations: Abdomen is soft.     Tenderness: There is no abdominal tenderness.  Musculoskeletal:     Cervical back: Normal range of motion and neck supple.  Skin:    General: Skin is warm and dry.     Findings: No rash.  Neurological:     Mental Status: She is alert.  Psychiatric:  Mood and Affect: Mood is not anxious or depressed.        Speech: Speech normal.        Behavior: Behavior normal. Behavior is cooperative.        Thought Content: Thought content normal.        Judgment: Judgment normal.       Results for orders placed or performed in visit on 04/28/22  Hemoglobin A1c  Result Value Ref Range   Hgb A1c MFr Bld 8.5 (H) 4.6 - 6.5 %  Lipid panel  Result Value Ref Range   Cholesterol 139 0 - 200 mg/dL   Triglycerides 100.0 0.0 - 149.0 mg/dL   HDL 43.70 >39.00 mg/dL   VLDL 20.0 0.0 - 40.0 mg/dL   LDL Cholesterol 75 0 - 99 mg/dL   Total CHOL/HDL Ratio 3    NonHDL 66.06   Basic metabolic panel  Result Value Ref Range   Sodium 143 135 - 145 mEq/L   Potassium 4.1 3.5 - 5.1 mEq/L   Chloride 107 96 - 112 mEq/L   CO2 25 19 - 32 mEq/L   Glucose, Bld 130 (H) 70 - 99 mg/dL   BUN 22 6 - 23 mg/dL   Creatinine, Ser 1.00 0.40 - 1.20 mg/dL   GFR 55.51 (L) >60.00 mL/min   Calcium 9.2 8.4 - 10.5 mg/dL    Assessment and Plan  Type 2 diabetes mellitus with stage 3a chronic kidney disease, with long-term current use of insulin (HCC) Assessment & Plan: Chronic, Recently established with Dr. Kelton Pillar. On metfomrin xr 2 tablets twice daily, Tresiba new start insulin in 05/2022. 12 units daily. Prandin 1 mg three times daily Was trulicity stopped given weight loss.   Hypertension associated  with diabetes ([place]) Assessment & Plan:  Well controlled on no medication.    Gastroesophageal reflux disease, unspecified whether esophagitis present Assessment & Plan: Chronic, requires PPI every other day.   Hyperlipidemia associated with type 2 diabetes mellitus ([place]) Assessment & Plan: Stable, chronic.  Continue current medication.  Pravastatin 20 mg daily   Diabetic autonomic neuropathy associated with type 2 diabetes mellitus ([place]) Assessment & Plan:  Associated with DM, no treatment required as it is mild.   Diabetic nephropathy associated with type 2 diabetes mellitus ([place]) Assessment & Plan:  Followed by renal, Dr. Candiss Norse.   Female genital prolapse, unspecified type Assessment & Plan:  S/P surgery to repair in last 2 years.   B12 deficiency Assessment & Plan:   Given B12 shot today. Start B12 1000 mg daily.  Orders: -     Cyanocobalamin  HYPERKALEMIA Assessment & Plan:  Recurrent, likely due to  CKD.   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  Moderate control on low dose Buspar... we can discuss changing to SSRI at next OV.    Cervical pain Assessment & Plan: Acute, posterior headache likely secondary to arthritis in neck given significant decreased range of motion.  She will use Tyleno extra strength 2 capsules twice daily as needed for pain as NSAIDs are contraindicated given her kidney disease. We will evaluate with plain x-ray of the cervical spine.   Orders: -     DG Cervical Spine Complete; Future    Return in about 8 months (around 04/20/2023) for phone AMW,  fasting labs then 34mn CPE with me.   AEliezer Lofts MD

## 2022-08-19 NOTE — Assessment & Plan Note (Signed)
Recurrent, likely due to  CKD.

## 2022-08-19 NOTE — Assessment & Plan Note (Signed)
Given B12 shot today. Start B12 1000 mg daily.

## 2022-08-19 NOTE — Assessment & Plan Note (Addendum)
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  Moderate control on low dose Buspar... we can discuss changing to SSRI at next OV.

## 2022-08-19 NOTE — Assessment & Plan Note (Signed)
Chronic, requires PPI every other day.

## 2022-08-20 ENCOUNTER — Other Ambulatory Visit: Payer: Self-pay | Admitting: Internal Medicine

## 2022-08-20 ENCOUNTER — Encounter: Payer: Self-pay | Admitting: Internal Medicine

## 2022-08-20 DIAGNOSIS — Z794 Long term (current) use of insulin: Secondary | ICD-10-CM

## 2022-08-20 MED ORDER — REPAGLINIDE 1 MG PO TABS: ORAL_TABLET | ORAL | 1 refills | Status: DC

## 2022-08-20 NOTE — Telephone Encounter (Signed)
Patient not currently not having any symptoms but would like you to look at her Dexcom report. She doesn't have an appointment until May and wants to make sure everything is okay.

## 2022-08-20 NOTE — Assessment & Plan Note (Addendum)
Acute, posterior headache likely secondary to arthritis in neck given significant decreased range of motion.  She will use Tyleno extra strength 2 capsules twice daily as needed for pain as NSAIDs are contraindicated given her kidney disease. We will evaluate with plain x-ray of the cervical spine.

## 2022-08-20 NOTE — Telephone Encounter (Signed)
Patient verified that she is taking medication correctly and will make the medication adjustment as advised.

## 2022-08-21 ENCOUNTER — Encounter: Payer: Self-pay | Admitting: Family Medicine

## 2022-09-11 ENCOUNTER — Telehealth: Payer: Self-pay | Admitting: Internal Medicine

## 2022-09-11 NOTE — Telephone Encounter (Signed)
Patient came by the office today saying that her insurance was not covering either on of her needles and an alternative was needed.  A copy of the letter is in Dr. Quin Hoop folder in front office.  Patient states that she only has 2 days supply left.

## 2022-09-12 NOTE — Telephone Encounter (Signed)
Pharmacy refilled pen needles that they had on file for the patient and states that [facility] has been covering them

## 2022-09-12 NOTE — Telephone Encounter (Signed)
Patient will contact insurance and find out which brand and give a call back

## 2022-09-23 NOTE — Progress Notes (Unsigned)
Name: Wanda [name]  MRN/ DOB: [id number], 01-26-1946   Age/ Sex: 77 y.o., female    PCP: Jinny Sanders, MD   Reason for Endocrinology Evaluation: Type 2 Diabetes Mellitus     Date of Initial Endocrinology Visit: 09/30/2019    PATIENT IDENTIFIER: [name] is a 77 y.o. female with a past medical history of T2DM, Dyslipidemia and HTN. The patient presented for initial endocrinology clinic visit on 09/30/2019 for consultative assistance with her diabetes management.    HPI: [name] was    Diagnosed with DM > 30 yrs Prior Medications tried/Intolerance: repaglinide was started 05/2022, insulin was started 03/2022. Trulicity caused severe weight loss . Jardiance - yeast infection  .  Intolerant to higher doses of metformin Hemoglobin A1c has ranged from 6.9% in 2014, peaking at 9.6% in 2023.   Pt follows with urology for urinary frequency   Has burning sensation of the feet   Denies pancreatitis but has chronic abdominal  pain    On her initial visit to our clinic she had an A1c of 8.5%, she was on metformin, repaglinide, and basal insulin which we adjusted  SUBJECTIVE:   During the last visit (05/19/2022): A1c 8.5%  Today (09/24/22): Wanda [name] is here for a follow up on diabetes management. She checks her blood sugars multiple  times daily. The patient has  had hypoglycemic episodes since the last clinic visit, which typically occur after breakfast   She denies any recent abdominal pains Has occasional diarrhea with alternating constipation   HOME DIABETES REGIMEN: Metformin 500 mg XR 2 tabs daily  Repaglinide 1 mg, 1 tab before breakfast, 1 tab before lunch and 2 tabs before supper Tresiba 12 units daily      Statin: yes ACE-I/ARB: no   CONTINUOUS GLUCOSE MONITORING RECORD INTERPRETATION    Dates of Recording: 2/8-2/21/2024  Sensor description:dexcom  Results statistics:   CGM use % of time 93  Average and SD 203/66  Time in range       40 %  % Time  Above 180 36  % Time above 250 23  % Time Below target <1   Glycemic patterns summary: optimal BG's during the night and high during the day   Hyperglycemic episodes  post prandial  Hypoglycemic episodes occurred after breakfast  Overnight periods: optimal      DIABETIC COMPLICATIONS: Microvascular complications:  Neuropathy  Denies: retinopathy, CKD  Last eye exam: Completed 04/2022  Macrovascular complications:   Denies: CAD, PVD, CVA   PAST HISTORY: Past Medical History:  Past Medical History:  Diagnosis Date   Anxiety    Colon polyps    Depression    DM (diabetes mellitus) ([place])    Female bladder prolapse    Fibromyalgia    "neurofibromyalgia"   Headache    HTN (hypertension)    Hyperlipidemia    Past Surgical History:  Past Surgical History:  Procedure Laterality Date   COLONOSCOPY  03/28/2019   Dr Collene Mares   WRIST SURGERY  1969    Social History:  reports that she has never smoked. She has never used smokeless tobacco. She reports that she does not drink alcohol and does not use drugs. Family History:  Family History  Problem Relation Age of Onset   Diabetes Mother    Hypertension Mother    Hyperlipidemia Mother    Stroke Mother    Heart disease Mother    Heart disease Father    Diabetes Sister    Diabetes Sister  Breast cancer Sister    Parkinson's disease Brother    Heart disease Maternal Grandmother    Diabetes Maternal Grandfather    Heart attack Other    Colon cancer Neg Hx    Esophageal cancer Neg Hx      HOME MEDICATIONS: Allergies as of 09/24/2022       Reactions   Niacin    REACTION: unbearable hot flashes   Sulfamethoxazole-trimethoprim Itching        Medication List        Accurate as of September 24, 2022  7:49 AM. If you have any questions, ask your nurse or doctor.          STOP taking these medications    BD Disp Needles 30G X 1/2" Misc Generic drug: NEEDLE (DISP) 30 G Stopped by: Dorita Sciara, MD        TAKE these medications    Accu-Chek Guide Me w/Device Kit USE AS DIRECTED TO CHECK BLOOD SUGAR TWICE DAILY   Accu-Chek Softclix Lancets lancets 2 (two) times daily.   acetaminophen 325 MG tablet Commonly known as: TYLENOL Take 2 tablets by mouth as needed.   busPIRone 15 MG tablet Commonly known as: BUSPAR Take 0.5 tablets (7.5 mg total) by mouth 2 (two) times daily. What changed:  when to take this additional instructions   Insulin Pen Needle 32G X 4 MM Misc 1 Device by Does not apply route daily in the afternoon.   metFORMIN 500 MG 24 hr tablet Commonly known as: GLUCOPHAGE-XR Take 2 tablets (1,000 mg total) by mouth daily with breakfast.   omeprazole 20 MG capsule Commonly known as: PRILOSEC Take 1 capsule (20 mg total) by mouth every other day.   pravastatin 20 MG tablet Commonly known as: PRAVACHOL TAKE 1 TABLET BY MOUTH DAILY WITH EVENING MEAL   repaglinide 2 MG tablet Commonly known as: PRANDIN Take 1 tablet (2 mg total) by mouth 2 (two) times daily before a meal. What changed:  medication strength See the new instructions. Changed by: Dorita Sciara, MD   Tyler Aas FlexTouch 100 UNIT/ML FlexTouch Pen Generic drug: insulin degludec Inject 12 Units into the skin daily.         ALLERGIES: Allergies  Allergen Reactions   Niacin     REACTION: unbearable hot flashes   Sulfamethoxazole-Trimethoprim Itching     REVIEW OF SYSTEMS: A comprehensive ROS was conducted with the patient and is negative except as per HPI   OBJECTIVE:   VITAL SIGNS: BP 134/80 (BP Location: Left Arm, Patient Position: Sitting, Cuff Size: Small)   Pulse 73   Ht 5' 4.5" (1.638 m)   Wt 143 lb (64.9 kg)   SpO2 99%   BMI 24.17 kg/m    PHYSICAL EXAM:  General: Pt appears well and is in NAD  Neck: General: Supple without adenopathy or carotid bruits. Thyroid: Thyroid size normal.    Lungs: Clear with good BS bilat with no rales, rhonchi, or wheezes  Heart: RRR    Extremities:  Lower extremities - No pretibial edema. No lesions.  Neuro: MS is good with appropriate affect, pt is alert and Ox3    DM foot exam: 05/19/2022  The skin of the feet is intact without sores or ulcerations. The pedal pulses are 2+ on right and 2+ on left. The sensation is intact to a screening 5.07, 10 gram monofilament bilaterally   DATA REVIEWED:  Lab Results  Component Value Date   HGBA1C 7.8 (A) 09/24/2022  HGBA1C 8.5 (H) 04/28/2022   HGBA1C 8.5 (A) 01/24/2022   Lab Results  Component Value Date   MICROALBUR 1.3 10/02/2014   LDLCALC 75 04/28/2022   CREATININE 1.00 04/28/2022   Lab Results  Component Value Date   MICRALBCREAT 0.156 07/16/2021    Lab Results  Component Value Date   CHOL 139 04/28/2022   HDL 43.70 04/28/2022   LDLCALC 75 04/28/2022   LDLDIRECT 96.0 07/06/2020   TRIG 100.0 04/28/2022   CHOLHDL 3 04/28/2022         ASSESSMENT / PLAN / RECOMMENDATIONS:   1) Type 2 Diabetes Mellitus,Sub-optimally  controlled, With Neuropathic and CKD III complications - Most recent A1c of 7.8 %. Goal A1c < 7.0 %.      - A1c is improving but the pt has been noted with hypoglycemia during the day , middle of the afternoon , will stop repaglinide at lunch from now on and increase repaglinide with Breakfast as she has been noted with hyperglycemia  - Intolerant to higher metformin -She developed yeast infection to Jardiance, and is currently under the care of urology for urinary frequency and prolapse -She believes she developed abdominal pain with Trulicity  MEDICATIONS:  Continue metformin 500 mg, 2 tabs daily Continue Tresiba 12 units daily Change repaglinide 2 mg , 1 tab before Breakfast and Supper (none for lunch )   EDUCATION / INSTRUCTIONS: BG monitoring instructions: Patient is instructed to check her blood sugars 3 times a day, before meals. Call [facility] if: BG persistently < 70  I reviewed the Rule of 15 for the  treatment of hypoglycemia in detail with the patient. Literature supplied.   2) Diabetic complications:  Eye: Does not have known diabetic retinopathy.  Neuro/ Feet: Does  have known diabetic peripheral neuropathy. Renal: Patient does not have known baseline CKD. She is not on an ACEI/ARB at present.    Follow-up in 6 months    Signed electronically by: Mack Guise, MD  [facility]  [address] [place], [place] [address] Phone: [phone] FAX: [phone]   CC: Jinny Sanders, MD [place] [address] Phone: [phone]  Fax: [phone]    Return to Endocrinology clinic as below: Future Appointments  Date Time Provider [place]  12/19/2022  2:15 PM [facility] NURSE HEALTH ADVISOR [facility]  04/10/2023  9:00 AM [facility] LAB [facility]  04/17/2023 11:20 AM Jinny Sanders, MD [facility]

## 2022-09-24 ENCOUNTER — Encounter: Payer: Self-pay | Admitting: Internal Medicine

## 2022-09-24 ENCOUNTER — Ambulatory Visit: Payer: Medicare PPO | Admitting: Internal Medicine

## 2022-09-24 VITALS — BP 134/80 | HR 73 | Ht 64.5 in | Wt 143.0 lb

## 2022-09-24 DIAGNOSIS — E1122 Type 2 diabetes mellitus with diabetic chronic kidney disease: Secondary | ICD-10-CM

## 2022-09-24 DIAGNOSIS — Z794 Long term (current) use of insulin: Secondary | ICD-10-CM | POA: Diagnosis not present

## 2022-09-24 DIAGNOSIS — E1165 Type 2 diabetes mellitus with hyperglycemia: Secondary | ICD-10-CM

## 2022-09-24 DIAGNOSIS — E1142 Type 2 diabetes mellitus with diabetic polyneuropathy: Secondary | ICD-10-CM

## 2022-09-24 DIAGNOSIS — N1831 Chronic kidney disease, stage 3a: Secondary | ICD-10-CM

## 2022-09-24 LAB — POCT GLYCOSYLATED HEMOGLOBIN (HGB A1C): Hemoglobin A1C: 7.8 % — AB (ref 4.0–5.6)

## 2022-09-24 MED ORDER — INSULIN PEN NEEDLE 32G X 4 MM MISC: 1.0000 | Freq: Every day | 3 refills | Status: DC

## 2022-09-24 MED ORDER — TRESIBA FLEXTOUCH 100 UNIT/ML ~~LOC~~ SOPN: 12.0000 [IU] | PEN_INJECTOR | Freq: Every day | SUBCUTANEOUS | 3 refills | Status: DC

## 2022-09-24 MED ORDER — REPAGLINIDE 2 MG PO TABS: 2.0000 mg | ORAL_TABLET | Freq: Two times a day (BID) | ORAL | 3 refills | Status: DC

## 2022-09-24 MED ORDER — METFORMIN HCL ER 500 MG PO TB24: 1000.0000 mg | ORAL_TABLET | Freq: Every day | ORAL | 3 refills | Status: DC

## 2022-09-24 NOTE — Patient Instructions (Addendum)
Continue Metformin 500 mg XR 2 tablets  daily  Change Repaglinide 2 mg , 1 tablet before Breakfast and Supper (none at lunch) Continue Tresiba 12 units daily    Choose healthy, lower carb lower calorie snacks: toss salad, cooked vegetables, cottage cheese, peanut butter, low fat cheese / string cheese, lower sodium deli meat, tuna salad or chicken salad   HOW TO TREAT LOW BLOOD SUGARS (Blood sugar LESS THAN 70 MG/DL) Please follow the RULE OF 15 for the treatment of hypoglycemia treatment (when your (blood sugars are less than 70 mg/dL)   STEP 1: Take 15 grams of carbohydrates when your blood sugar is low, which includes:  3-4 GLUCOSE TABS  OR 3-4 OZ OF JUICE OR REGULAR SODA OR ONE TUBE OF GLUCOSE GEL    STEP 2: RECHECK blood sugar in 15 MINUTES STEP 3: If your blood sugar is still low at the 15 minute recheck --> then, go back to STEP 1 and treat AGAIN with another 15 grams of carbohydrates.

## 2022-10-03 ENCOUNTER — Telehealth: Payer: Medicare PPO

## 2022-10-28 ENCOUNTER — Other Ambulatory Visit: Payer: Self-pay | Admitting: *Deleted

## 2022-10-28 MED ORDER — BUSPIRONE HCL 15 MG PO TABS: 7.5000 mg | ORAL_TABLET | Freq: Two times a day (BID) | ORAL | 1 refills | Status: DC

## 2022-11-09 DIAGNOSIS — E1165 Type 2 diabetes mellitus with hyperglycemia: Secondary | ICD-10-CM | POA: Diagnosis not present

## 2022-11-09 DIAGNOSIS — Z794 Long term (current) use of insulin: Secondary | ICD-10-CM | POA: Diagnosis not present

## 2022-11-25 ENCOUNTER — Telehealth: Payer: Self-pay | Admitting: Family Medicine

## 2022-11-25 NOTE — Telephone Encounter (Signed)
Contacted Wanda [name] to schedule their annual wellness visit. Appointment made for 12/31/2022. Left a detailed message informing patient of date and time change of her appt.  [facility] Care Guide [facility] [place] AWV TEAM Direct Dial: [phone]

## 2022-11-28 ENCOUNTER — Other Ambulatory Visit: Payer: Self-pay | Admitting: *Deleted

## 2022-11-28 MED ORDER — OMEPRAZOLE 20 MG PO CPDR: 20.0000 mg | DELAYED_RELEASE_CAPSULE | ORAL | 1 refills | Status: DC

## 2022-12-09 DIAGNOSIS — Z794 Long term (current) use of insulin: Secondary | ICD-10-CM | POA: Diagnosis not present

## 2022-12-09 DIAGNOSIS — E1165 Type 2 diabetes mellitus with hyperglycemia: Secondary | ICD-10-CM | POA: Diagnosis not present

## 2022-12-31 ENCOUNTER — Ambulatory Visit (INDEPENDENT_AMBULATORY_CARE_PROVIDER_SITE_OTHER): Payer: Medicare PPO

## 2022-12-31 VITALS — Ht 64.0 in | Wt 148.0 lb

## 2022-12-31 DIAGNOSIS — Z0000 Encounter for general adult medical examination without abnormal findings: Secondary | ICD-10-CM

## 2022-12-31 NOTE — Patient Instructions (Signed)
Wanda [name] , Thank you for taking time to come for your Medicare Wellness Visit. I appreciate your ongoing commitment to your health goals. Please review the following plan we discussed and let me know if I can assist you in the future.   These are the goals we discussed:  Goals       Increase physical activity      Starting 02/20/16, I will continue to exercise at gym at least 60 minutes as tolerated.       Increase physical activity (pt-stated)      I would like to continue traveling.      Monitor and Manage My Blood Sugar-Diabetes Type 2      Timeframe:  Long-Range Goal Priority:  High Start Date:   07/24/21                          Expected End Date: 07/24/22                      Follow Up Date March 2023   - check blood sugar at prescribed times - check blood sugar if I feel it is too high or too low - take the blood sugar meter to all doctor visits    Why is this important?   Checking your blood sugar at home helps to keep it from getting very high or very low.  Writing the results in a diary or log helps the doctor know how to care for you.  Your blood sugar log should have the time, date and the results.  Also, write down the amount of insulin or other medicine that you take.  Other information, like what you ate, exercise done and how you were feeling, will also be helpful.     Notes:       Patient Stated      Lose 10 pounds        This is a list of the screening recommended for you and due dates:  Health Maintenance  Topic Date Due   Zoster (Shingles) Vaccine (1 of 2) Never done   DTaP/Tdap/Td vaccine (2 - Tdap) 08/18/2017   Cologuard (Stool DNA test)  04/26/2021   COVID-19 Vaccine (3 - 2023-24 season) 04/04/2022   Yearly kidney health urinalysis for diabetes  07/16/2022   Eye exam for diabetics  11/02/2022   DEXA scan (bone density measurement)  12/26/2022   Flu Shot  03/05/2023   Hemoglobin A1C  03/25/2023   Yearly kidney function blood test for diabetes   04/29/2023   Complete foot exam   05/20/2023   Mammogram  06/17/2023   Medicare Annual Wellness Visit  12/31/2023   Pneumonia Vaccine  Completed   Hepatitis C Screening  Completed   HPV Vaccine  Aged Out    Advanced directives: Please bring a copy of your health care power of attorney and living will to the office to be added to your chart at your convenience.   Conditions/risks identified: Aim for 30 minutes of exercise or brisk walking, 6-8 glasses of water, and 5 servings of fruits and vegetables each day.   Next appointment: Follow up in one year for your annual wellness visit 01/04/24 @ 2:00 televisit   Preventive Care 65 Years and Older, Female Preventive care refers to lifestyle choices and visits with your health care provider that can promote health and wellness. What does preventive care include? A yearly physical exam. This is also  called an annual well check. Dental exams once or twice a year. Routine eye exams. Ask your health care provider how often you should have your eyes checked. Personal lifestyle choices, including: Daily care of your teeth and gums. Regular physical activity. Eating a healthy diet. Avoiding tobacco and drug use. Limiting alcohol use. Practicing safe sex. Taking low-dose aspirin every day. Taking vitamin and mineral supplements as recommended by your health care provider. What happens during an annual well check? The services and screenings done by your health care provider during your annual well check will depend on your age, overall health, lifestyle risk factors, and family history of disease. Counseling  Your health care provider may ask you questions about your: Alcohol use. Tobacco use. Drug use. Emotional well-being. Home and relationship well-being. Sexual activity. Eating habits. History of falls. Memory and ability to understand (cognition). Work and work Astronomer. Reproductive health. Screening  You may have the  following tests or measurements: Height, weight, and BMI. Blood pressure. Lipid and cholesterol levels. These may be checked every 5 years, or more frequently if you are over 33 years old. Skin check. Lung cancer screening. You may have this screening every year starting at age 67 if you have a 30-pack-year history of smoking and currently smoke or have quit within the past 15 years. Fecal occult blood test (FOBT) of the stool. You may have this test every year starting at age 27. Flexible sigmoidoscopy or colonoscopy. You may have a sigmoidoscopy every 5 years or a colonoscopy every 10 years starting at age 7. Hepatitis C blood test. Hepatitis B blood test. Sexually transmitted disease (STD) testing. Diabetes screening. This is done by checking your blood sugar (glucose) after you have not eaten for a while (fasting). You may have this done every 1-3 years. Bone density scan. This is done to screen for osteoporosis. You may have this done starting at age 45. Mammogram. This may be done every 1-2 years. Talk to your health care provider about how often you should have regular mammograms. Talk with your health care provider about your test results, treatment options, and if necessary, the need for more tests. Vaccines  Your health care provider may recommend certain vaccines, such as: Influenza vaccine. This is recommended every year. Tetanus, diphtheria, and acellular pertussis (Tdap, Td) vaccine. You may need a Td booster every 10 years. Zoster vaccine. You may need this after age 36. Pneumococcal 13-valent conjugate (PCV13) vaccine. One dose is recommended after age 44. Pneumococcal polysaccharide (PPSV23) vaccine. One dose is recommended after age 42. Talk to your health care provider about which screenings and vaccines you need and how often you need them. This information is not intended to replace advice given to you by your health care provider. Make sure you discuss any questions you  have with your health care provider. Document Released: 08/17/2015 Document Revised: 04/09/2016 Document Reviewed: 05/22/2015 Elsevier Interactive Patient Education  2017 [employer].  Fall Prevention in the Home Falls can cause injuries. They can happen to people of all ages. There are many things you can do to make your home safe and to help prevent falls. What can I do on the outside of my home? Regularly fix the edges of walkways and driveways and fix any cracks. Remove anything that might make you trip as you walk through a door, such as a raised step or threshold. Trim any bushes or trees on the path to your home. Use bright outdoor lighting. Clear any walking paths  of anything that might make someone trip, such as rocks or tools. Regularly check to see if handrails are loose or broken. Make sure that both sides of any steps have handrails. Any raised decks and porches should have guardrails on the edges. Have any leaves, snow, or ice cleared regularly. Use sand or salt on walking paths during winter. Clean up any spills in your garage right away. This includes oil or grease spills. What can I do in the bathroom? Use night lights. Install grab bars by the toilet and in the tub and shower. Do not use towel bars as grab bars. Use non-skid mats or decals in the tub or shower. If you need to sit down in the shower, use a plastic, non-slip stool. Keep the floor dry. Clean up any water that spills on the floor as soon as it happens. Remove soap buildup in the tub or shower regularly. Attach bath mats securely with double-sided non-slip rug tape. Do not have throw rugs and other things on the floor that can make you trip. What can I do in the bedroom? Use night lights. Make sure that you have a light by your bed that is easy to reach. Do not use any sheets or blankets that are too big for your bed. They should not hang down onto the floor. Have a firm chair that has side arms. You can  use this for support while you get dressed. Do not have throw rugs and other things on the floor that can make you trip. What can I do in the kitchen? Clean up any spills right away. Avoid walking on wet floors. Keep items that you use a lot in easy-to-reach places. If you need to reach something above you, use a strong step stool that has a grab bar. Keep electrical cords out of the way. Do not use floor polish or wax that makes floors slippery. If you must use wax, use non-skid floor wax. Do not have throw rugs and other things on the floor that can make you trip. What can I do with my stairs? Do not leave any items on the stairs. Make sure that there are handrails on both sides of the stairs and use them. Fix handrails that are broken or loose. Make sure that handrails are as long as the stairways. Check any carpeting to make sure that it is firmly attached to the stairs. Fix any carpet that is loose or worn. Avoid having throw rugs at the top or bottom of the stairs. If you do have throw rugs, attach them to the floor with carpet tape. Make sure that you have a light switch at the top of the stairs and the bottom of the stairs. If you do not have them, ask someone to add them for you. What else can I do to help prevent falls? Wear shoes that: Do not have high heels. Have rubber bottoms. Are comfortable and fit you well. Are closed at the toe. Do not wear sandals. If you use a stepladder: Make sure that it is fully opened. Do not climb a closed stepladder. Make sure that both sides of the stepladder are locked into place. Ask someone to hold it for you, if possible. Clearly mark and make sure that you can see: Any grab bars or handrails. First and last steps. Where the edge of each step is. Use tools that help you move around (mobility aids) if they are needed. These include: Canes. Walkers. Scooters. Crutches. Turn on  the lights when you go into a dark area. Replace any light  bulbs as soon as they burn out. Set up your furniture so you have a clear path. Avoid moving your furniture around. If any of your floors are uneven, fix them. If there are any pets around you, be aware of where they are. Review your medicines with your doctor. Some medicines can make you feel dizzy. This can increase your chance of falling. Ask your doctor what other things that you can do to help prevent falls. This information is not intended to replace advice given to you by your health care provider. Make sure you discuss any questions you have with your health care provider. Document Released: 05/17/2009 Document Revised: 12/27/2015 Document Reviewed: 08/25/2014 Elsevier Interactive Patient Education  2017 [employer].

## 2022-12-31 NOTE — Progress Notes (Signed)
I connected with  Orlin Hilding on 12/31/22 by a audio enabled telemedicine application and verified that I am speaking with the correct person using two identifiers.  Patient Location: Home  Provider Location: Office/Clinic  I discussed the limitations of evaluation and management by telemedicine. The patient expressed understanding and agreed to proceed.  Subjective:   Wanda [name] is a 77 y.o. female who presents for Medicare Annual (Subsequent) preventive examination.  Review of Systems      Cardiac Risk Factors include: advanced age (>39men, >9 women);hypertension;diabetes mellitus;sedentary lifestyle     Objective:    Today's Vitals   12/31/22 1425  Weight: 148 lb (67.1 kg)  Height: 5\' 4"  (1.626 m)   Body mass index is 25.4 kg/m.     12/31/2022    2:39 PM 12/17/2021    2:57 PM 02/19/2021    2:03 PM 02/20/2016   11:09 AM  Advanced Directives  Does Patient Have a Medical Advance Directive? Yes Yes Yes Yes  Type of Estate agent of [place];Living will Healthcare Power of [place];Living will Healthcare Power of [place];Living will Living will;Healthcare Power of Attorney  Does patient want to make changes to medical advance directive?  No - Patient declined No - Patient declined Yes - information given  Copy of Healthcare Power of Attorney in Chart? No - copy requested No - copy requested      Current Medications (verified) Outpatient Encounter Medications as of 12/31/2022  Medication Sig   Accu-Chek Softclix Lancets lancets 2 (two) times daily.   acetaminophen (TYLENOL) 325 MG tablet Take 2 tablets by mouth as needed.   Blood Glucose Monitoring Suppl (ACCU-CHEK GUIDE ME) w/Device KIT USE AS DIRECTED TO CHECK BLOOD SUGAR TWICE DAILY   busPIRone (BUSPAR) 15 MG tablet Take 0.5 tablets (7.5 mg total) by mouth 2 (two) times daily.   insulin degludec (TRESIBA FLEXTOUCH) 100 UNIT/ML FlexTouch Pen Inject 12 Units into the skin daily.   Insulin Pen Needle  32G X 4 MM MISC 1 Device by Does not apply route daily in the afternoon.   metFORMIN (GLUCOPHAGE-XR) 500 MG 24 hr tablet Take 2 tablets (1,000 mg total) by mouth daily with breakfast.   pravastatin (PRAVACHOL) 20 MG tablet TAKE 1 TABLET BY MOUTH DAILY WITH EVENING MEAL   repaglinide (PRANDIN) 2 MG tablet Take 1 tablet (2 mg total) by mouth 2 (two) times daily before a meal.   omeprazole (PRILOSEC) 20 MG capsule Take 1 capsule (20 mg total) by mouth every other day. (Patient not taking: Reported on 12/31/2022)   No facility-administered encounter medications on file as of 12/31/2022.    Allergies (verified) Niacin and Sulfamethoxazole-trimethoprim   History: Past Medical History:  Diagnosis Date   Anxiety    Colon polyps    Depression    DM (diabetes mellitus) (HCC)    Female bladder prolapse    Fibromyalgia    "neurofibromyalgia"   Headache    HTN (hypertension)    Hyperlipidemia    Past Surgical History:  Procedure Laterality Date   COLONOSCOPY  03/28/2019   Dr Loreta Ave   WRIST SURGERY  1969   Family History  Problem Relation Age of Onset   Diabetes Mother    Hypertension Mother    Hyperlipidemia Mother    Stroke Mother    Heart disease Mother    Heart disease Father    Diabetes Sister    Diabetes Sister    Breast cancer Sister    Parkinson's disease Brother  Heart disease Maternal Grandmother    Diabetes Maternal Grandfather    Heart attack Other    Colon cancer Neg Hx    Esophageal cancer Neg Hx    Social History   Socioeconomic History   Marital status: Widowed    Spouse name: Not on file   Number of children: 2   Years of education: Master's degree   Highest education level: Not on file  Occupational History    Comment: retired Runner, broadcasting/film/video  Tobacco Use   Smoking status: Never   Smokeless tobacco: Never  Vaping Use   Vaping Use: Never used  Substance and Sexual Activity   Alcohol use: No    Alcohol/week: 0.0 standard drinks of alcohol   Drug use: No    Sexual activity: Not Currently  Other Topics Concern   Not on file  Social History Narrative   Lives with husband    caffeine - none   01/16/20   From: [place]    Living: alone   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) - has gone back some and may return parttime      Family: Public house manager (mosiacism down's syndrome, lives in garage apartment) and Jeannett Senior       Enjoys: pickle-ball prior to covid, traveling with sisters, house projects      Exercise: not currently - wants to get back to walking   Diet: trying to reduce portions, limit soda      Safety   Seat belts: Yes    Guns: No   Safe in relationships: Yes    Social Determinants of Health   Financial Resource Strain: Low Risk  (12/31/2022)   Overall Financial Resource Strain (CARDIA)    Difficulty of Paying Living Expenses: Not hard at all  Food Insecurity: No Food Insecurity (12/31/2022)   Hunger Vital Sign    Worried About Running Out of Food in the Last Year: Never true    Ran Out of Food in the Last Year: Never true  Transportation Needs: No Transportation Needs (12/31/2022)   PRAPARE - Administrator, Civil Service (Medical): No    Lack of Transportation (Non-Medical): No  Physical Activity: Inactive (12/31/2022)   Exercise Vital Sign    Days of Exercise per Week: 0 days    Minutes of Exercise per Session: 0 min  Stress: No Stress Concern Present (12/31/2022)   [employer] of Occupational Health - Occupational Stress Questionnaire    Feeling of Stress : Not at all  Social Connections: Moderately Integrated (12/31/2022)   Social Connection and Isolation Panel [NHANES]    Frequency of Communication with Friends and Family: More than three times a week    Frequency of Social Gatherings with Friends and Family: More than three times a week    Attends Religious Services: More than 4 times per year    Active Member of [employer] or Organizations: Yes    Attends Banker Meetings: More  than 4 times per year    Marital Status: Widowed    Tobacco Counseling Counseling given: Not Answered   Clinical Intake:  Pre-visit preparation completed: Yes  Pain : No/denies pain     Nutritional Risks: None Diabetes: Yes CBG done?: Yes (169) CBG resulted in Enter/ Edit results?: No Did pt. bring in CBG monitor from home?: No  How often do you need to have someone help you when you read instructions, pamphlets, or other written materials from your doctor or pharmacy?: 1 - Never  Diabetic? Nutrition Risk Assessment:  Has the patient had any N/V/D within the last 2 months?  No  Does the patient have any non-healing wounds?  No  Has the patient had any unintentional weight loss or weight gain?  No   Diabetes:  Is the patient diabetic?  Yes  If diabetic, was a CBG obtained today?  Yes , 169 per pt Did the patient bring in their glucometer from home?  No  How often do you monitor your CBG's? Continuous monitor.   Financial Strains and Diabetes Management:  Are you having any financial strains with the device, your supplies or your medication? No .  Does the patient want to be seen by Chronic Care Management for management of their diabetes?  No  Would the patient like to be referred to a Nutritionist or for Diabetic Management?  No   Diabetic Exams:  Diabetic Eye Exam: Completed 11/01/21 [facility] Diabetic Foot Exam: Completed 05/19/22 PCP    Interpreter Needed?: No  Information entered by :: C.Truly Stankiewicz LPN   Activities of Daily Living    12/31/2022    2:39 PM  In your present state of health, do you have any difficulty performing the following activities:  Hearing? 0  Vision? 0  Difficulty concentrating or making decisions? 1  Comment occasionally  Walking or climbing stairs? 0  Dressing or bathing? 0  Doing errands, shopping? 0  Preparing Food and eating ? N  Using the Toilet? N  In the past six months, have you accidently leaked urine? Y   Comment occasionally  Do you have problems with loss of bowel control? N  Managing your Medications? N  Managing your Finances? N  Housekeeping or managing your Housekeeping? N    Patient Care Team: Excell Seltzer, MD as PCP - General (Family Medicine) Antony Contras, MD as Consulting Physician (Ophthalmology) [facility], Konrad Dolores, MD as Consulting Physician (Endocrinology) Meredith Pel, NP as Nurse Practitioner (Gastroenterology) Kathyrn Sheriff, [facility] as Pharmacist (Pharmacist)  Indicate any recent Medical Services you may have received from other than [facility] providers in the past year (date may be approximate).     Assessment:   This is a routine wellness examination for [place].  Hearing/Vision screen Hearing Screening - Comments:: No hearing issues Vision Screening - Comments:: Glasses - [facility]  Dietary issues and exercise activities discussed: Current Exercise Habits: The patient does not participate in regular exercise at present, Exercise limited by: None identified   Goals Addressed             This Visit's Progress    Patient Stated       Lose 10 pounds       Depression Screen    12/31/2022    2:38 PM 08/19/2022   11:21 AM 12/17/2021    2:51 PM 05/23/2021   12:52 PM 02/19/2021    2:04 PM 08/22/2019   11:00 AM 07/15/2018   11:06 AM  PHQ 2/9 Scores  PHQ - 2 Score 0 2 0 1 0 0 2  PHQ- 9 Score  5  4   11     Fall Risk    12/31/2022    2:39 PM 08/19/2022   11:20 AM 12/17/2021    2:55 PM 04/02/2021    3:22 PM 02/19/2021    2:03 PM  Fall Risk   Falls in the past year? 0 1 1 0 0  Number falls in past yr: 0 0 0    Injury with Fall?  0 0 0    Comment   No injury or medical attention needed    Risk for fall due to : No Fall Risks No Fall Risks No Fall Risks    Follow up Falls prevention discussed;Falls evaluation completed Falls evaluation completed       FALL RISK PREVENTION PERTAINING TO THE HOME:  Any stairs in or around the home?  No  If so, are there any without handrails? No  Home free of loose throw rugs in walkways, pet beds, electrical cords, etc? Yes  Adequate lighting in your home to reduce risk of falls? Yes   ASSISTIVE DEVICES UTILIZED TO PREVENT FALLS:  Life alert? No  Use of a cane, walker or w/c? No  Grab bars in the bathroom? No  Shower chair or bench in shower? No  Elevated toilet seat or a handicapped toilet? No    Cognitive Function:    02/20/2016   11:14 AM  MMSE - Mini Mental State Exam  Orientation to time 5  Orientation to Place 5  Registration 3  Attention/ Calculation 0  Recall 3  Language- name 2 objects 0  Language- repeat 1  Language- follow 3 step command 3  Language- read & follow direction 0  Write a sentence 0  Copy design 0  Total score 20        12/31/2022    2:45 PM 12/17/2021    2:57 PM  6CIT Screen  What Year? 0 points 0 points  What month? 0 points 0 points  What time? 0 points 0 points  Count back from 20 0 points 0 points  Months in reverse 0 points 0 points  Repeat phrase 0 points 0 points  Total Score 0 points 0 points    Immunizations Immunization History  Administered Date(s) Administered   Influenza Split 06/03/2012   Influenza,inj,Quad PF,6+ Mos 08/10/2013, 05/03/2014, 05/06/2017, 07/15/2018, 05/18/2019   PFIZER(Purple Top)SARS-COV-2 Vaccination 09/18/2019, 10/11/2019   PPD Test 04/19/2013, 04/14/2022   Pneumococcal Conjugate-13 10/02/2014   Pneumococcal Polysaccharide-23 04/25/2013   Td 08/19/2007    TDAP status: Due, Education has been provided regarding the importance of this vaccine. Advised may receive this vaccine at local pharmacy or Health Dept. Aware to provide a copy of the vaccination record if obtained from local pharmacy or Health Dept. Verbalized acceptance and understanding.  Flu Vaccine status: Up to date  Pneumococcal vaccine status: Up to date  Covid-19 vaccine status: Information provided on how to obtain vaccines.    Qualifies for Shingles Vaccine? Yes   Zostavax completed No   Shingrix Completed?: No.    Education has been provided regarding the importance of this vaccine. Patient has been advised to call insurance company to determine out of pocket expense if they have not yet received this vaccine. Advised may also receive vaccine at local pharmacy or Health Dept. Verbalized acceptance and understanding.  Screening Tests Health Maintenance  Topic Date Due   Zoster Vaccines- Shingrix (1 of 2) Never done   DTaP/Tdap/Td (2 - Tdap) 08/18/2017   Fecal DNA (Cologuard)  04/26/2021   COVID-19 Vaccine (3 - 2023-24 season) 04/04/2022   Diabetic kidney evaluation - Urine ACR  07/16/2022   OPHTHALMOLOGY EXAM  11/02/2022   DEXA SCAN  12/26/2022   INFLUENZA VACCINE  03/05/2023   HEMOGLOBIN A1C  03/25/2023   Diabetic kidney evaluation - eGFR measurement  04/29/2023   FOOT EXAM  05/20/2023   MAMMOGRAM  06/17/2023   Medicare Annual Wellness (AWV)  12/31/2023  Pneumonia Vaccine 73+ Years old  Completed   Hepatitis C Screening  Completed   HPV VACCINES  Aged Out    Health Maintenance  Health Maintenance Due  Topic Date Due   Zoster Vaccines- Shingrix (1 of 2) Never done   DTaP/Tdap/Td (2 - Tdap) 08/18/2017   Fecal DNA (Cologuard)  04/26/2021   COVID-19 Vaccine (3 - 2023-24 season) 04/04/2022   Diabetic kidney evaluation - Urine ACR  07/16/2022   OPHTHALMOLOGY EXAM  11/02/2022   DEXA SCAN  12/26/2022    Colorectal cancer screening: No longer required.   Mammogram status: Completed 06/16/2022. Repeat every year  Bone Density status: Completed 12/25/20. Results reflect: Bone density results: OSTEOPENIA. Repeat every 2 years. Patient declined.  Lung Cancer Screening: (Low Dose CT Chest recommended if Age 39-80 years, 30 pack-year currently smoking OR have quit w/in 15years.) does not qualify.   Lung Cancer Screening Referral: no  Additional Screening:  Hepatitis C Screening: does qualify;  Completed 02/20/16  Vision Screening: Recommended annual ophthalmology exams for early detection of glaucoma and other disorders of the eye. Is the patient up to date with their annual eye exam?  Yes  Who is the provider or what is the name of the office in which the patient attends annual eye exams? [facility] If pt is not established with a provider, would they like to be referred to a provider to establish care? Yes .   Dental Screening: Recommended annual dental exams for proper oral hygiene  Community Resource Referral / Chronic Care Management: CRR required this visit?  No   CCM required this visit?  No      Plan:     I have personally reviewed and noted the following in the patient's chart:   Medical and social history Use of alcohol, tobacco or illicit drugs  Current medications and supplements including opioid prescriptions. Patient is not currently taking opioid prescriptions. Functional ability and status Nutritional status Physical activity Advanced directives List of other physicians Hospitalizations, surgeries, and ER visits in previous 12 months Vitals Screenings to include cognitive, depression, and falls Referrals and appointments  In addition, I have reviewed and discussed with patient certain preventive protocols, quality metrics, and best practice recommendations. A written personalized care plan for preventive services as well as general preventive health recommendations were provided to patient.     Maryan Puls, LPN   1/61/0960   Nurse Notes: no concerns

## 2023-01-02 ENCOUNTER — Ambulatory Visit: Payer: Medicare PPO | Admitting: Internal Medicine

## 2023-01-08 DIAGNOSIS — Z794 Long term (current) use of insulin: Secondary | ICD-10-CM | POA: Diagnosis not present

## 2023-01-08 DIAGNOSIS — E1165 Type 2 diabetes mellitus with hyperglycemia: Secondary | ICD-10-CM | POA: Diagnosis not present

## 2023-01-12 ENCOUNTER — Encounter: Payer: Self-pay | Admitting: Family Medicine

## 2023-01-12 ENCOUNTER — Ambulatory Visit: Payer: Medicare PPO | Admitting: Family Medicine

## 2023-01-12 VITALS — BP 122/82 | HR 77 | Temp 97.7°F | Ht 64.0 in | Wt 142.4 lb

## 2023-01-12 DIAGNOSIS — W57XXXA Bitten or stung by nonvenomous insect and other nonvenomous arthropods, initial encounter: Secondary | ICD-10-CM | POA: Diagnosis not present

## 2023-01-12 DIAGNOSIS — S80862A Insect bite (nonvenomous), left lower leg, initial encounter: Secondary | ICD-10-CM | POA: Diagnosis not present

## 2023-01-12 MED ORDER — TRIAMCINOLONE ACETONIDE 0.1 % EX CREA: 1.0000 | TOPICAL_CREAM | Freq: Two times a day (BID) | CUTANEOUS | 0 refills | Status: DC

## 2023-01-12 NOTE — Progress Notes (Signed)
Marikay Alar, MD Phone: [phone]  Wanda [name] is a 77 y.o. female who presents today for same-day visit.  Left ankle swelling/pain: Patient reports she did some yard work on Thursday and does not remember getting bitten though early Friday morning she developed left ankle pain, swelling, and itching with an erythematous patch just above her ankle anteriorly.  Notes no fevers.  She feels well systemic at this time though does note she had some diarrhea late Thursday.  No blood in her stool.  Has not had any recurrent diarrhea.  She tried Benadryl cream and antibiotic ointment with little benefit.  Patient notes her ankle swelling has improved over the weekend.  She notes that the erythematous spot is a little larger and she feels as though that may be there because of how much itching she has been doing.  Social History   Tobacco Use  Smoking Status Never  Smokeless Tobacco Never    Current Outpatient Medications on File Prior to Visit  Medication Sig Dispense Refill   Accu-Chek Softclix Lancets lancets 2 (two) times daily.     acetaminophen (TYLENOL) 325 MG tablet Take 2 tablets by mouth as needed.     Blood Glucose Monitoring Suppl (ACCU-CHEK GUIDE ME) w/Device KIT USE AS DIRECTED TO CHECK BLOOD SUGAR TWICE DAILY     busPIRone (BUSPAR) 15 MG tablet Take 0.5 tablets (7.5 mg total) by mouth 2 (two) times daily. 90 tablet 1   insulin degludec (TRESIBA FLEXTOUCH) 100 UNIT/ML FlexTouch Pen Inject 12 Units into the skin daily. 15 mL 3   Insulin Pen Needle 32G X 4 MM MISC 1 Device by Does not apply route daily in the afternoon. 100 each 3   metFORMIN (GLUCOPHAGE-XR) 500 MG 24 hr tablet Take 2 tablets (1,000 mg total) by mouth daily with breakfast. 180 tablet 3   omeprazole (PRILOSEC) 20 MG capsule Take 1 capsule (20 mg total) by mouth every other day. 45 capsule 1   pravastatin (PRAVACHOL) 20 MG tablet TAKE 1 TABLET BY MOUTH DAILY WITH EVENING MEAL 90 tablet 3   repaglinide (PRANDIN) 2  MG tablet Take 1 tablet (2 mg total) by mouth 2 (two) times daily before a meal. 180 tablet 3   No current facility-administered medications on file prior to visit.     ROS see history of present illness  Objective  Physical Exam Vitals:   01/12/23 1544 01/12/23 1545  BP: (!) 148/88 122/82  Pulse: 77   Temp: 97.7 F (36.5 C)   SpO2: 98%     BP Readings from Last 3 Encounters:  01/12/23 122/82  09/24/22 134/80  08/19/22 138/72   Wt Readings from Last 3 Encounters:  01/12/23 142 lb 6.4 oz (64.6 kg)  12/31/22 148 lb (67.1 kg)  09/24/22 143 lb (64.9 kg)    Physical Exam  Left ankle is swollen, there is some tenderness at the erythematous site above   Assessment/Plan: Please see individual problem list.  Insect bite of left lower leg, initial encounter Assessment & Plan: Suspect this is a reaction to an insect bite.  The swelling has been improving which would argue against infection.  She has not had any spreading redness either.  Doubt skin infection as a cause of her symptoms.  Discussed a trial of topical triamcinolone and taking Claritin or Zyrtec over-the-counter.  She can also ice her ankle.  If she develops spreading redness or purulent drainage or if she has any worsening symptoms she will contact us immediately.  If  this is not improving by the end of the week she will let us know as well.   Other orders -     Triamcinolone Acetonide; Apply 1 Application topically 2 (two) times daily.  Dispense: 30 g; Refill: 0     Return if symptoms worsen or fail to improve.   Marikay Alar, MD [facility]

## 2023-01-12 NOTE — Assessment & Plan Note (Signed)
Suspect this is a reaction to an insect bite.  The swelling has been improving which would argue against infection.  She has not had any spreading redness either.  Doubt skin infection as a cause of her symptoms.  Discussed a trial of topical triamcinolone and taking Claritin or Zyrtec over-the-counter.  She can also ice her ankle.  If she develops spreading redness or purulent drainage or if she has any worsening symptoms she will contact us immediately.  If this is not improving by the end of the week she will let us know as well.

## 2023-01-12 NOTE — Patient Instructions (Signed)
Nice to see you. Please try the triamcinolone on your rash.  You can ice your ankle.  You can also take Claritin or Zyrtec over-the-counter to help with itching. If you develop worsening symptoms please let us know right away.  If you develop spreading redness or drainage of pus please let us know immediately.

## 2023-01-27 DIAGNOSIS — E119 Type 2 diabetes mellitus without complications: Secondary | ICD-10-CM | POA: Diagnosis not present

## 2023-01-27 DIAGNOSIS — K219 Gastro-esophageal reflux disease without esophagitis: Secondary | ICD-10-CM | POA: Diagnosis not present

## 2023-01-27 DIAGNOSIS — Z7984 Long term (current) use of oral hypoglycemic drugs: Secondary | ICD-10-CM | POA: Diagnosis not present

## 2023-01-27 DIAGNOSIS — Z883 Allergy status to other anti-infective agents status: Secondary | ICD-10-CM | POA: Diagnosis not present

## 2023-01-27 DIAGNOSIS — F411 Generalized anxiety disorder: Secondary | ICD-10-CM | POA: Diagnosis not present

## 2023-01-27 DIAGNOSIS — R32 Unspecified urinary incontinence: Secondary | ICD-10-CM | POA: Diagnosis not present

## 2023-01-27 DIAGNOSIS — Z794 Long term (current) use of insulin: Secondary | ICD-10-CM | POA: Diagnosis not present

## 2023-01-27 DIAGNOSIS — I10 Essential (primary) hypertension: Secondary | ICD-10-CM | POA: Diagnosis not present

## 2023-01-27 DIAGNOSIS — E785 Hyperlipidemia, unspecified: Secondary | ICD-10-CM | POA: Diagnosis not present

## 2023-02-07 DIAGNOSIS — Z794 Long term (current) use of insulin: Secondary | ICD-10-CM | POA: Diagnosis not present

## 2023-02-07 DIAGNOSIS — E1165 Type 2 diabetes mellitus with hyperglycemia: Secondary | ICD-10-CM | POA: Diagnosis not present

## 2023-03-02 ENCOUNTER — Telehealth: Payer: Self-pay

## 2023-03-02 ENCOUNTER — Encounter: Payer: Self-pay | Admitting: Internal Medicine

## 2023-03-02 ENCOUNTER — Other Ambulatory Visit: Payer: Self-pay | Admitting: *Deleted

## 2023-03-02 MED ORDER — PRAVASTATIN SODIUM 20 MG PO TABS: ORAL_TABLET | ORAL | 0 refills | Status: DC

## 2023-03-02 MED ORDER — REPAGLINIDE 2 MG PO TABS: ORAL_TABLET | ORAL | 3 refills | Status: DC

## 2023-03-02 NOTE — Telephone Encounter (Signed)
Patient has been advised and will make necessary changes

## 2023-03-02 NOTE — Telephone Encounter (Signed)
Patient states that her sugars have been running in the 250 range. She has been taking [place] 12 units once daily. Metformin 500mg  2 tabs once a day, and Repaglinide 2mg   BID. Patient also states that her foot swelling has gotten worse.  [facility] report has been printed for review.

## 2023-03-09 DIAGNOSIS — Z794 Long term (current) use of insulin: Secondary | ICD-10-CM | POA: Diagnosis not present

## 2023-03-09 DIAGNOSIS — E1165 Type 2 diabetes mellitus with hyperglycemia: Secondary | ICD-10-CM | POA: Diagnosis not present

## 2023-03-18 ENCOUNTER — Telehealth: Payer: Self-pay | Admitting: *Deleted

## 2023-03-18 ENCOUNTER — Ambulatory Visit: Payer: Medicare PPO | Admitting: Family Medicine

## 2023-03-18 ENCOUNTER — Encounter: Payer: Self-pay | Admitting: Family Medicine

## 2023-03-18 VITALS — BP 130/72 | HR 74 | Temp 98.6°F | Ht 64.0 in | Wt 146.0 lb

## 2023-03-18 DIAGNOSIS — E538 Deficiency of other specified B group vitamins: Secondary | ICD-10-CM

## 2023-03-18 DIAGNOSIS — R1031 Right lower quadrant pain: Secondary | ICD-10-CM | POA: Diagnosis not present

## 2023-03-18 DIAGNOSIS — M79672 Pain in left foot: Secondary | ICD-10-CM | POA: Diagnosis not present

## 2023-03-18 DIAGNOSIS — N1831 Chronic kidney disease, stage 3a: Secondary | ICD-10-CM

## 2023-03-18 DIAGNOSIS — E1122 Type 2 diabetes mellitus with diabetic chronic kidney disease: Secondary | ICD-10-CM

## 2023-03-18 DIAGNOSIS — Z794 Long term (current) use of insulin: Secondary | ICD-10-CM | POA: Diagnosis not present

## 2023-03-18 DIAGNOSIS — M79671 Pain in right foot: Secondary | ICD-10-CM | POA: Diagnosis not present

## 2023-03-18 DIAGNOSIS — E1169 Type 2 diabetes mellitus with other specified complication: Secondary | ICD-10-CM

## 2023-03-18 LAB — MICROALBUMIN / CREATININE URINE RATIO
Creatinine,U: 88.4 mg/dL
Microalb Creat Ratio: 3 mg/g (ref 0.0–30.0)
Microalb, Ur: 2.6 mg/dL — ABNORMAL HIGH (ref 0.0–1.9)

## 2023-03-18 NOTE — Assessment & Plan Note (Signed)
Acute, symptoms most consistent with plantar fasciitis.  She does have some cold feeling in her feet at night that could be due to diabetic neuropathy but for her heel plain does not clearly seem associated with this.  Treat with home physical therapy, ice massage and topical Voltaren gel 1% 4 times daily as needed to heal.  If symptoms or not improving as expected, follow-up.  Return and ER precautions provided.

## 2023-03-18 NOTE — Telephone Encounter (Signed)
-----   Message from Alvina Chou sent at 03/18/2023  4:06 PM EDT ----- Regarding: Lab orders for Monday, 8.26.24 Patient is scheduled for CPX labs, please order future labs, Thanks , Camelia Eng

## 2023-03-18 NOTE — Assessment & Plan Note (Addendum)
Acute, evaluate for ovarian pathology with ultrasound pelvic/transpelvic.  Otherwise pain may be from GI tract possible IBS given her description of chronically alternating diarrhea and constipation.  Return and ER precautions provided.

## 2023-03-18 NOTE — Patient Instructions (Signed)
We will set up  pelvic /transvaginal  to look into right lower quadrant pain.  Start home stretching, ice massage and diclofenac gel 4 times daily on anterior heels.

## 2023-03-18 NOTE — Assessment & Plan Note (Signed)
Chronic, due for urine microalbumin.  She will return for lab work.  She has upcoming follow-up with endocrinology.

## 2023-03-18 NOTE — Progress Notes (Signed)
Patient ID: Wanda [name], female    DOB: Dec 07, 1945, 77 y.o.   MRN: [id number]  This visit was conducted in person.  BP 130/72   Pulse 74   Temp 98.6 F (37 C) (Temporal)   Ht 5\' 4"  (1.626 m)   Wt 146 lb (66.2 kg)   SpO2 99%   BMI 25.06 kg/m    CC:  Chief Complaint  Patient presents with   Pain    B/L foot pain   Abdominal Pain    LRQ x 3 weeks off an on     Subjective:   HPI: Wanda [name] is a 77 y.o. female with HTN and Dm presenting on 03/18/2023 for Pain (B/L foot pain) and Abdominal Pain (LRQ x 3 weeks off an on )   New onset bilateral foot pain ongoing in last several months.  >.R.  pain on sole of foot at heel after sitting for a while, in mornings  No burning, ache in anterior foot. She has moved to new house in May dso doing more yard work.  Reviewed  acute OV 01/12/23 [place] swelling in right ankle medial..    Has also noted abdominal pain intermittently.RLQ pain... in last 4-8 weeks.  Pain is  moderate, describes as ache,  constant when occurring, lasts 24 hours.  She chronically has issues with alternating D and C.   Feels some better with good BM.  No N/V.  Nml appetite No weight loss.  No blood in stool.   Has trid OTC pain cream.   Wt Readings from Last 3 Encounters:  03/18/23 146 lb (66.2 kg)  01/12/23 142 lb 6.4 oz (64.6 kg)  12/31/22 148 lb (67.1 kg)      DM followed by ENDO. Lab Results  Component Value Date   HGBA1C 7.8 (A) 09/24/2022        Relevant past medical, surgical, family and social history reviewed and updated as indicated. Interim medical history since our last visit reviewed. Allergies and medications reviewed and updated. Outpatient Medications Prior to Visit  Medication Sig Dispense Refill   Accu-Chek Softclix Lancets lancets 2 (two) times daily.     acetaminophen (TYLENOL) 325 MG tablet Take 2 tablets by mouth as needed.     Blood Glucose Monitoring Suppl (ACCU-CHEK GUIDE ME) w/Device KIT USE AS DIRECTED TO  CHECK BLOOD SUGAR TWICE DAILY     insulin degludec (TRESIBA FLEXTOUCH) 100 UNIT/ML FlexTouch Pen Inject 12 Units into the skin daily. 15 mL 3   Insulin Pen Needle 32G X 4 MM MISC 1 Device by Does not apply route daily in the afternoon. 100 each 3   metFORMIN (GLUCOPHAGE-XR) 500 MG 24 hr tablet Take 2 tablets (1,000 mg total) by mouth daily with breakfast. 180 tablet 3   omeprazole (PRILOSEC) 20 MG capsule Take 1 capsule (20 mg total) by mouth every other day. 45 capsule 1   pravastatin (PRAVACHOL) 20 MG tablet TAKE 1 TABLET BY MOUTH DAILY WITH EVENING MEAL 90 tablet 0   repaglinide (PRANDIN) 2 MG tablet Take 1 tablet (2 mg total) by mouth daily before breakfast AND 2 tablets (4 mg total) daily before supper. 270 tablet 3   triamcinolone cream (KENALOG) 0.1 % Apply 1 Application topically 2 (two) times daily. 30 g 0   busPIRone (BUSPAR) 15 MG tablet Take 0.5 tablets (7.5 mg total) by mouth 2 (two) times daily. (Patient not taking: Reported on 03/18/2023) 90 tablet 1   No facility-administered medications prior to visit.  Per HPI unless specifically indicated in ROS section below Review of Systems  Constitutional:  Negative for fatigue and fever.  HENT:  Negative for congestion.   Eyes:  Negative for pain.  Respiratory:  Negative for cough and shortness of breath.   Cardiovascular:  Negative for chest pain, palpitations and leg swelling.  Gastrointestinal:  Negative for abdominal pain.  Genitourinary:  Negative for dysuria and vaginal bleeding.  Musculoskeletal:  Negative for back pain.  Neurological:  Negative for syncope, light-headedness and headaches.  Psychiatric/Behavioral:  Negative for dysphoric mood.    Objective:  BP 130/72   Pulse 74   Temp 98.6 F (37 C) (Temporal)   Ht 5\' 4"  (1.626 m)   Wt 146 lb (66.2 kg)   SpO2 99%   BMI 25.06 kg/m   Wt Readings from Last 3 Encounters:  03/18/23 146 lb (66.2 kg)  01/12/23 142 lb 6.4 oz (64.6 kg)  12/31/22 148 lb (67.1 kg)       Physical Exam Constitutional:      General: She is not in acute distress.    Appearance: Normal appearance. She is well-developed. She is not ill-appearing or toxic-appearing.  HENT:     Head: Normocephalic.     Right Ear: Hearing, tympanic membrane, ear canal and external ear normal. Tympanic membrane is not erythematous, retracted or bulging.     Left Ear: Hearing, tympanic membrane, ear canal and external ear normal. Tympanic membrane is not erythematous, retracted or bulging.     Nose: No mucosal edema or rhinorrhea.     Right Sinus: No maxillary sinus tenderness or frontal sinus tenderness.     Left Sinus: No maxillary sinus tenderness or frontal sinus tenderness.     Mouth/Throat:     Mouth: Oropharynx is clear and moist and mucous membranes are normal.     Pharynx: Uvula midline.  Eyes:     General: Lids are normal. Lids are everted, no foreign bodies appreciated.     Extraocular Movements: EOM normal.     Conjunctiva/sclera: Conjunctivae normal.     Pupils: Pupils are equal, round, and reactive to light.  Neck:     Thyroid: No thyroid mass or thyromegaly.     Vascular: No carotid bruit.     Trachea: Trachea normal.  Cardiovascular:     Rate and Rhythm: Normal rate and regular rhythm.     Pulses: Normal pulses.     Heart sounds: Normal heart sounds, S1 normal and S2 normal. No murmur heard.    No friction rub. No gallop.  Pulmonary:     Effort: Pulmonary effort is normal. No tachypnea or respiratory distress.     Breath sounds: Normal breath sounds. No decreased breath sounds, wheezing, rhonchi or rales.  Abdominal:     General: Bowel sounds are normal.     Palpations: Abdomen is soft.     Tenderness: There is abdominal tenderness in the right lower quadrant. There is no right CVA tenderness, left CVA tenderness, guarding or rebound. Negative signs include Murphy's sign.  Musculoskeletal:     Cervical back: Normal range of motion and neck supple.     Right foot: No  swelling.     Left foot: No swelling.     Comments: Tender to palpation at insertion of plantar fascia bilateral feet, some soreness with heel squeeze as well. Normal sensation to monofilament  She continues to have enlagement of right medial malleoli are prominence.. feel bony and not like edema  Skin:  General: Skin is warm, dry and intact.     Findings: No rash.  Neurological:     Mental Status: She is alert.  Psychiatric:        Mood and Affect: Mood is not anxious or depressed.        Speech: Speech normal.        Behavior: Behavior normal. Behavior is cooperative.        Thought Content: Thought content normal.        Cognition and Memory: Cognition and memory normal.        Judgment: Judgment normal.       Results for orders placed or performed in visit on 09/24/22  POCT glycosylated hemoglobin (Hb A1C)  Result Value Ref Range   Hemoglobin A1C 7.8 (A) 4.0 - 5.6 %   HbA1c POC (<> result, manual entry)     HbA1c, POC (prediabetic range)     HbA1c, POC (controlled diabetic range)      Assessment and Plan  RLQ abdominal pain Assessment & Plan: Acute, evaluate for ovarian pathology with ultrasound pelvic/transpelvic.  Otherwise pain may be from GI tract possible IBS given her description of chronically alternating diarrhea and constipation.  Return and ER precautions provided.  Orders: -     US PELVIC COMPLETE WITH TRANSVAGINAL; Future  Heel pain, bilateral Assessment & Plan: Acute, symptoms most consistent with plantar fasciitis.  She does have some cold feeling in her feet at night that could be due to diabetic neuropathy but for her heel plain does not clearly seem associated with this.  Treat with home physical therapy, ice massage and topical Voltaren gel 1% 4 times daily as needed to heal.  If symptoms or not improving as expected, follow-up.  Return and ER precautions provided.   Type 2 diabetes mellitus with stage 3a chronic kidney disease, with long-term  current use of insulin (HCC) Assessment & Plan: Chronic, due for urine microalbumin.  She will return for lab work.  She has upcoming follow-up with endocrinology.  Orders: -     Microalbumin / creatinine urine ratio    No follow-ups on file.   Kerby Nora, MD

## 2023-03-19 ENCOUNTER — Encounter (INDEPENDENT_AMBULATORY_CARE_PROVIDER_SITE_OTHER): Payer: Self-pay

## 2023-03-27 ENCOUNTER — Ambulatory Visit
Admission: RE | Admit: 2023-03-27 | Discharge: 2023-03-27 | Disposition: A | Discharge status: Home / Self Care | Payer: Medicare PPO | Source: Ambulatory Visit | Attending: Family Medicine | Admitting: Family Medicine

## 2023-03-27 DIAGNOSIS — R1031 Right lower quadrant pain: Secondary | ICD-10-CM | POA: Insufficient documentation

## 2023-03-27 DIAGNOSIS — R9389 Abnormal findings on diagnostic imaging of other specified body structures: Secondary | ICD-10-CM | POA: Diagnosis not present

## 2023-03-30 ENCOUNTER — Other Ambulatory Visit: Payer: Medicare PPO

## 2023-03-31 ENCOUNTER — Encounter: Payer: Self-pay | Admitting: Family Medicine

## 2023-04-01 ENCOUNTER — Encounter: Payer: Self-pay | Admitting: Internal Medicine

## 2023-04-01 ENCOUNTER — Ambulatory Visit: Payer: Medicare PPO | Admitting: Internal Medicine

## 2023-04-01 ENCOUNTER — Telehealth: Payer: Self-pay

## 2023-04-01 VITALS — BP 136/82 | HR 72 | Ht 64.0 in | Wt 144.8 lb

## 2023-04-01 DIAGNOSIS — N1832 Chronic kidney disease, stage 3b: Secondary | ICD-10-CM | POA: Diagnosis not present

## 2023-04-01 DIAGNOSIS — E1122 Type 2 diabetes mellitus with diabetic chronic kidney disease: Secondary | ICD-10-CM | POA: Diagnosis not present

## 2023-04-01 DIAGNOSIS — E782 Mixed hyperlipidemia: Secondary | ICD-10-CM

## 2023-04-01 DIAGNOSIS — M722 Plantar fascial fibromatosis: Secondary | ICD-10-CM

## 2023-04-01 DIAGNOSIS — Z794 Long term (current) use of insulin: Secondary | ICD-10-CM | POA: Diagnosis not present

## 2023-04-01 DIAGNOSIS — Z7984 Long term (current) use of oral hypoglycemic drugs: Secondary | ICD-10-CM

## 2023-04-01 DIAGNOSIS — E1165 Type 2 diabetes mellitus with hyperglycemia: Secondary | ICD-10-CM

## 2023-04-01 LAB — MICROALBUMIN / CREATININE URINE RATIO
Creatinine,U: 91 mg/dL
Microalb Creat Ratio: 1.7 mg/g (ref 0.0–30.0)
Microalb, Ur: 1.5 mg/dL (ref 0.0–1.9)

## 2023-04-01 LAB — BASIC METABOLIC PANEL
BUN: 12 mg/dL (ref 6–23)
CO2: 25 mEq/L (ref 19–32)
Calcium: 9.3 mg/dL (ref 8.4–10.5)
Chloride: 109 mEq/L (ref 96–112)
Creatinine, Ser: 0.8 mg/dL (ref 0.40–1.20)
GFR: 72.09 mL/min (ref >60.00)
Glucose, Bld: 152 mg/dL — ABNORMAL HIGH (ref 70–99)
Potassium: 4.2 mEq/L (ref 3.5–5.1)
Sodium: 143 mEq/L (ref 135–145)

## 2023-04-01 LAB — POCT GLYCOSYLATED HEMOGLOBIN (HGB A1C): Hemoglobin A1C: 8.1 % — AB (ref 4.0–5.6)

## 2023-04-01 LAB — LIPID PANEL
Cholesterol: 152 mg/dL (ref 0–200)
HDL: 35.1 mg/dL — ABNORMAL LOW (ref >39.00)
NonHDL: 116.56
Total CHOL/HDL Ratio: 4
Triglycerides: 305 mg/dL — ABNORMAL HIGH (ref 0.0–149.0)
VLDL: 61 mg/dL — ABNORMAL HIGH (ref 0.0–40.0)

## 2023-04-01 LAB — LDL CHOLESTEROL, DIRECT: Direct LDL: 90 mg/dL

## 2023-04-01 MED ORDER — REPAGLINIDE 2 MG PO TABS: 2.0000 mg | ORAL_TABLET | Freq: Three times a day (TID) | ORAL | 3 refills | Status: DC

## 2023-04-01 MED ORDER — TRESIBA FLEXTOUCH 100 UNIT/ML ~~LOC~~ SOPN: 10.0000 [IU] | PEN_INJECTOR | Freq: Every day | SUBCUTANEOUS | 3 refills | Status: DC

## 2023-04-01 MED ORDER — SEMAGLUTIDE(0.25 OR 0.5MG/DOS) 2 MG/3ML ~~LOC~~ SOPN: 0.5000 mg | PEN_INJECTOR | SUBCUTANEOUS | 2 refills | Status: DC

## 2023-04-01 MED ORDER — METFORMIN HCL ER 500 MG PO TB24: 1000.0000 mg | ORAL_TABLET | Freq: Every day | ORAL | 3 refills | Status: DC

## 2023-04-01 MED ORDER — INSULIN PEN NEEDLE 32G X 4 MM MISC: 1.0000 | Freq: Every day | 3 refills | Status: DC

## 2023-04-01 NOTE — Telephone Encounter (Signed)
Medication Samples have been provided to the patient.  Drug name: Ozempic        Strength: 0.25mg         Qty: 1 box  LOT: [id number]  Exp.Date: 06/03/2024  Dosing instructions: INJECT 0.25MG  WEEKLY FOR 6 WEEKS.  The patient has been instructed regarding the correct time, dose, and frequency of taking this medication, including desired effects and most common side effects.   Wanda [name] L Wanda [name] 10:05 AM 04/01/2023

## 2023-04-01 NOTE — Progress Notes (Unsigned)
Name: Wanda [name]  MRN/ DOB: [id number], 1946-05-28   Age/ Sex: 77 y.o., female    PCP: Excell Seltzer, MD   Reason for Endocrinology Evaluation: Type 2 Diabetes Mellitus     Date of Initial Endocrinology Visit: 09/30/2019    PATIENT IDENTIFIER: Wanda [name] is a 77 y.o. female with a past medical history of T2DM, Dyslipidemia and HTN. The patient presented for initial endocrinology clinic visit on 09/30/2019 for consultative assistance with her diabetes management.    HPI: Wanda [name] was    Diagnosed with DM > 30 yrs Prior Medications tried/Intolerance: repaglinide was started 05/2022, insulin was started 03/2022. Trulicity caused severe weight loss . Jardiance - yeast infection  .  Intolerant to higher doses of metformin Hemoglobin A1c has ranged from 6.9% in 2014, peaking at 9.6% in 2023.   Pt follows with urology for urinary frequency   Has burning sensation of the feet   Denies pancreatitis but has chronic abdominal  pain    On her initial visit to our clinic she had an A1c of 8.5%, she was on metformin, repaglinide, and basal insulin which we adjusted  SUBJECTIVE:   During the last visit (09/24/2022): A1c 7.8%  Today (04/01/23): Wanda [name] is here for a follow up on diabetes management. She checks her blood sugars multiple  times daily. The patient has  had hypoglycemic episodes since the last clinic visit, which typically occur at night   She has tingling/numbness  and planatar pain of both feet , worse on the left , improves with walking    HOME DIABETES REGIMEN: Metformin 500 mg XR 2 tabs daily  Repaglinide 2 mg, 1 tab before breakfast,  1 lunch and 2 tabs before supper Tresiba 12 units daily      Statin: yes ACE-I/ARB: no   CONTINUOUS GLUCOSE MONITORING RECORD INTERPRETATION    Dates of Recording: 8/15-8/28/2024  Sensor description:dexcom  Results statistics:   CGM use % of time 50  Average and SD 181/54  Time in range  56 %  % Time Above  180 32  % Time above 250 12  % Time Below target 0   Glycemic patterns summary: BG's trend down overnight and fluctuate during the day  Hyperglycemic episodes  post prandial  Hypoglycemic episodes occurred  n/a  Overnight periods: variable      DIABETIC COMPLICATIONS: Microvascular complications:  Neuropathy  Denies: retinopathy, CKD  Last eye exam: Completed 04/2022  Macrovascular complications:   Denies: CAD, PVD, CVA   PAST HISTORY: Past Medical History:  Past Medical History:  Diagnosis Date   Anxiety    Colon polyps    Depression    DM (diabetes mellitus) (HCC)    Female bladder prolapse    Fibromyalgia    "neurofibromyalgia"   Headache    HTN (hypertension)    Hyperlipidemia    Past Surgical History:  Past Surgical History:  Procedure Laterality Date   COLONOSCOPY  03/28/2019   Dr Loreta Ave   WRIST SURGERY  1969    Social History:  reports that she has never smoked. She has never used smokeless tobacco. She reports that she does not drink alcohol and does not use drugs. Family History:  Family History  Problem Relation Age of Onset   Diabetes Mother    Hypertension Mother    Hyperlipidemia Mother    Stroke Mother    Heart disease Mother    Heart disease Father    Diabetes Sister    Diabetes Sister  Breast cancer Sister    Parkinson's disease Brother    Heart disease Maternal Grandmother    Diabetes Maternal Grandfather    Heart attack Other    Colon cancer Neg Hx    Esophageal cancer Neg Hx      HOME MEDICATIONS: Allergies as of 04/01/2023       Reactions   Niacin    REACTION: unbearable hot flashes   Sulfamethoxazole-trimethoprim Itching        Medication List        Accurate as of April 01, 2023  9:00 AM. If you have any questions, ask your nurse or doctor.          Accu-Chek Guide Me w/Device Kit USE AS DIRECTED TO CHECK BLOOD SUGAR TWICE DAILY   Accu-Chek Softclix Lancets lancets 2 (two) times daily.    acetaminophen 325 MG tablet Commonly known as: TYLENOL Take 2 tablets by mouth as needed.   busPIRone 15 MG tablet Commonly known as: BUSPAR Take 0.5 tablets (7.5 mg total) by mouth 2 (two) times daily.   Insulin Pen Needle 32G X 4 MM Misc 1 Device by Does not apply route daily in the afternoon.   metFORMIN 500 MG 24 hr tablet Commonly known as: GLUCOPHAGE-XR Take 2 tablets (1,000 mg total) by mouth daily with breakfast.   omeprazole 20 MG capsule Commonly known as: PRILOSEC Take 1 capsule (20 mg total) by mouth every other day.   pravastatin 20 MG tablet Commonly known as: PRAVACHOL TAKE 1 TABLET BY MOUTH DAILY WITH EVENING MEAL   repaglinide 2 MG tablet Commonly known as: PRANDIN Take 1 tablet (2 mg total) by mouth daily before breakfast AND 2 tablets (4 mg total) daily before supper. What changed: See the new instructions.   Evaristo Bury FlexTouch 100 UNIT/ML FlexTouch Pen Generic drug: insulin degludec Inject 12 Units into the skin daily.   triamcinolone cream 0.1 % Commonly known as: KENALOG Apply 1 Application topically 2 (two) times daily.         ALLERGIES: Allergies  Allergen Reactions   Niacin     REACTION: unbearable hot flashes   Sulfamethoxazole-Trimethoprim Itching     REVIEW OF SYSTEMS: A comprehensive ROS was conducted with the patient and is negative except as per HPI   OBJECTIVE:   VITAL SIGNS: BP 136/82 (BP Location: Left Arm, Patient Position: Sitting, Cuff Size: Small)   Pulse 72   Ht 5\' 4"  (1.626 m)   Wt 144 lb 12.8 oz (65.7 kg)   SpO2 99%   BMI 24.85 kg/m    PHYSICAL EXAM:  General: Pt appears well and is in NAD  Neck: Thyroid: Thyroid size normal.    Lungs: Clear with good BS bilat   Heart: RRR   Extremities:  Lower extremities - No pretibial edema  Neuro: MS is good with appropriate affect, pt is alert and Ox3    DM foot exam: 04/01/2023  The skin of the feet is intact without sores or ulcerations. The pedal pulses are  2+ on right and 2+ on left. The sensation is intact to a screening 5.07, 10 gram monofilament bilaterally   DATA REVIEWED:  Lab Results  Component Value Date   HGBA1C 8.1 (A) 04/01/2023   HGBA1C 7.8 (A) 09/24/2022   HGBA1C 8.5 (H) 04/28/2022   Lab Results  Component Value Date   MICROALBUR 2.6 (H) 03/18/2023   LDLCALC 75 04/28/2022   CREATININE 1.00 04/28/2022   Lab Results  Component Value Date   MICRALBCREAT  3.0 03/18/2023    Lab Results  Component Value Date   CHOL 139 04/28/2022   HDL 43.70 04/28/2022   LDLCALC 75 04/28/2022   LDLDIRECT 96.0 07/06/2020   TRIG 100.0 04/28/2022   CHOLHDL 3 04/28/2022         ASSESSMENT / PLAN / RECOMMENDATIONS:   1) Type 2 Diabetes Mellitus, Poorly controlled, With Neuropathic and CKD III complications - Most recent A1c of 8.1 %. Goal A1c < 7.0 %.     - Intolerant to higher metformin -She developed yeast infection to Jardiance, and is currently under the care of urology for urinary frequency and prolapse -She believes she developed abdominal pain with Trulicity, we discussed trying Ozempic, caution against GI side effects -Will decrease Tresiba to prevent hypoglycemia -Will decrease repaglinide as below  MEDICATIONS:  Start Ozempic 0.25 mg weekly for 6 weeks then increase to 0.5 mg weekly Continue Metformin 500 mg, 2 tabs daily Decrease Tresiba 10 units daily Change repaglinide 2 mg , 1 tab before Breakfast , lunch and supper  EDUCATION / INSTRUCTIONS: BG monitoring instructions: Patient is instructed to check her blood sugars 3 times a day, before meals. Call [facility] if: BG persistently < 70  I reviewed the Rule of 15 for the treatment of hypoglycemia in detail with the patient. Literature supplied.   2) Diabetic complications:  Eye: Does not have known diabetic retinopathy.  Neuro/ Feet: Does  have known diabetic peripheral neuropathy. Renal: Patient does not have known baseline CKD. She is not on  an ACEI/ARB at present.  3) Dyslipidemia :  -TG elevated, will prefer LDL to be lower than 90 Mg/DL -Will switch pravastatin to atorvastatin as below  Medication Stop pravastatin 20 mg Start atorvastatin 20 mg daily   4) Left Plantar fasciitis:  -We discussed the importance of appropriate footwear -Patient to avoid NSAIDs but may use Tylenol as needed for the pain -We discussed stretching exercises as well as applying cold compress -A referral to podiatry has been placed   Follow-up in 4 months    Signed electronically by: Lyndle Herrlich, MD  [facility]  [facility] [address] [place] [place], [place] [address] Phone: [phone] FAX: [phone]   CC: Excell Seltzer, MD [address] [place] [address] Phone: [phone]  Fax: [phone]    Return to Endocrinology clinic as below: Future Appointments  Date Time Provider Department Center  04/20/2023  9:30 AM [facility] LAB [facility]  04/27/2023 10:40 AM Excell Seltzer, MD [facility]  01/04/2024  2:00 PM [facility] ANNUAL WELLNESS VISIT 1 [facility]

## 2023-04-01 NOTE — Patient Instructions (Addendum)
Start Ozempic 0.25 mg once weekly for 6 weeks, then increase to 0.5 mg once weekly Continue Metformin 500 mg XR 2 tablets  daily  Change Repaglinide 2 mg , 1 tablet before Breakfast, lunch and supper Decrease Tresiba 10 units daily      HOW TO TREAT LOW BLOOD SUGARS (Blood sugar LESS THAN 70 MG/DL) Please follow the RULE OF 15 for the treatment of hypoglycemia treatment (when your (blood sugars are less than 70 mg/dL)   STEP 1: Take 15 grams of carbohydrates when your blood sugar is low, which includes:  3-4 GLUCOSE TABS  OR 3-4 OZ OF JUICE OR REGULAR SODA OR ONE TUBE OF GLUCOSE GEL    STEP 2: RECHECK blood sugar in 15 MINUTES STEP 3: If your blood sugar is still low at the 15 minute recheck --> then, go back to STEP 1 and treat AGAIN with another 15 grams of carbohydrates.

## 2023-04-02 ENCOUNTER — Encounter: Payer: Self-pay | Admitting: Internal Medicine

## 2023-04-02 DIAGNOSIS — Z794 Long term (current) use of insulin: Secondary | ICD-10-CM

## 2023-04-02 DIAGNOSIS — E782 Mixed hyperlipidemia: Secondary | ICD-10-CM | POA: Insufficient documentation

## 2023-04-02 DIAGNOSIS — Z7984 Long term (current) use of oral hypoglycemic drugs: Secondary | ICD-10-CM | POA: Insufficient documentation

## 2023-04-02 DIAGNOSIS — E1122 Type 2 diabetes mellitus with diabetic chronic kidney disease: Secondary | ICD-10-CM

## 2023-04-02 MED ORDER — ATORVASTATIN CALCIUM 20 MG PO TABS: 20.0000 mg | ORAL_TABLET | Freq: Every day | ORAL | 3 refills | Status: DC

## 2023-04-08 DIAGNOSIS — E1165 Type 2 diabetes mellitus with hyperglycemia: Secondary | ICD-10-CM | POA: Diagnosis not present

## 2023-04-08 DIAGNOSIS — Z794 Long term (current) use of insulin: Secondary | ICD-10-CM | POA: Diagnosis not present

## 2023-04-10 ENCOUNTER — Other Ambulatory Visit: Payer: Medicare PPO

## 2023-04-10 DIAGNOSIS — E119 Type 2 diabetes mellitus without complications: Secondary | ICD-10-CM | POA: Diagnosis not present

## 2023-04-10 DIAGNOSIS — H524 Presbyopia: Secondary | ICD-10-CM | POA: Diagnosis not present

## 2023-04-10 DIAGNOSIS — H2513 Age-related nuclear cataract, bilateral: Secondary | ICD-10-CM | POA: Diagnosis not present

## 2023-04-10 LAB — HM DIABETES EYE EXAM

## 2023-04-13 ENCOUNTER — Encounter: Payer: Self-pay | Admitting: Internal Medicine

## 2023-04-14 ENCOUNTER — Ambulatory Visit: Payer: Medicare PPO | Admitting: Podiatry

## 2023-04-14 DIAGNOSIS — M722 Plantar fascial fibromatosis: Secondary | ICD-10-CM

## 2023-04-14 NOTE — Progress Notes (Signed)
Subjective:  Patient ID: Wanda [name], female    DOB: [birth year],  MRN: [id number]  Chief Complaint  Patient presents with   Foot Pain    77 y.o. female presents with the above complaint.  Patient presents with bilateral heel pain that has been going for quite some time is progressive gotten worse worse with ambulation worse with pressure she has not seen anyone else prior to seeing me denies any other acute complaints.  She would like to discuss treatment options pain scale 7 out of 10 dull achy in nature.   Review of Systems: Negative except as noted in the HPI. Denies N/V/F/Ch.  Past Medical History:  Diagnosis Date   Anxiety    Colon polyps    Depression    DM (diabetes mellitus) (HCC)    Female bladder prolapse    Fibromyalgia    "neurofibromyalgia"   Headache    HTN (hypertension)    Hyperlipidemia     Current Outpatient Medications:    Accu-Chek Softclix Lancets lancets, 2 (two) times daily., Disp: , Rfl:    acetaminophen (TYLENOL) 325 MG tablet, Take 2 tablets by mouth as needed., Disp: , Rfl:    atorvastatin (LIPITOR) 20 MG tablet, Take 1 tablet (20 mg total) by mouth daily., Disp: 90 tablet, Rfl: 3   Blood Glucose Monitoring Suppl (ACCU-CHEK GUIDE ME) w/Device KIT, USE AS DIRECTED TO CHECK BLOOD SUGAR TWICE DAILY, Disp: , Rfl:    busPIRone (BUSPAR) 15 MG tablet, Take 0.5 tablets (7.5 mg total) by mouth 2 (two) times daily., Disp: 90 tablet, Rfl: 1   insulin degludec (TRESIBA FLEXTOUCH) 100 UNIT/ML FlexTouch Pen, Inject 10 Units into the skin daily., Disp: 15 mL, Rfl: 3   Insulin Pen Needle 32G X 4 MM MISC, 1 Device by Does not apply route daily in the afternoon., Disp: 100 each, Rfl: 3   metFORMIN (GLUCOPHAGE-XR) 500 MG 24 hr tablet, Take 2 tablets (1,000 mg total) by mouth daily with breakfast., Disp: 180 tablet, Rfl: 3   omeprazole (PRILOSEC) 20 MG capsule, Take 1 capsule (20 mg total) by mouth every other day., Disp: 45 capsule, Rfl: 1   repaglinide (PRANDIN) 2 MG  tablet, Take 1 tablet (2 mg total) by mouth 3 (three) times daily before meals., Disp: 90 tablet, Rfl: 3   Semaglutide,0.25 or 0.5MG /DOS, 2 MG/3ML SOPN, Inject 0.5 mg into the skin once a week., Disp: 9 mL, Rfl: 2   triamcinolone cream (KENALOG) 0.1 %, Apply 1 Application topically 2 (two) times daily., Disp: 30 g, Rfl: 0  Social History   Tobacco Use  Smoking Status Never  Smokeless Tobacco Never    Allergies  Allergen Reactions   Niacin     REACTION: unbearable hot flashes   Sulfamethoxazole-Trimethoprim Itching   Objective:  There were no vitals filed for this visit. There is no height or weight on file to calculate BMI. Constitutional Well developed. Well nourished.  Vascular Dorsalis pedis pulses palpable bilaterally. Posterior tibial pulses palpable bilaterally. Capillary refill normal to all digits.  No cyanosis or clubbing noted. Pedal hair growth normal.  Neurologic Normal speech. Oriented to person, place, and time. Epicritic sensation to light touch grossly present bilaterally.  Dermatologic Nails well groomed and normal in appearance. No open wounds. No skin lesions.  Orthopedic: Normal joint ROM without pain or crepitus bilaterally. No visible deformities. Tender to palpation at the calcaneal tuber bilaterally. No pain with calcaneal squeeze bilaterally. Ankle ROM diminished range of motion, full range with pain bilaterally. [place]  Test: positive bilaterally.   Radiographs: None  Assessment:   1. Plantar fasciitis of right foot   2. Plantar fasciitis of left foot    Plan:  Patient was evaluated and treated and all questions answered.  Plantar Fasciitis, bilaterally - XR reviewed as above.  - Educated on icing and stretching. Instructions given.  - Injection delivered to the plantar fascia as below. - DME: Plantar fascial brace dispensed to support the medial longitudinal arch of the foot and offload pressure from the heel and prevent arch  collapse during weightbearing - Pharmacologic management: None  Procedure: Injection Tendon/Ligament Location: Bilateral plantar fascia at the glabrous junction; medial approach. Skin Prep: alcohol Injectate: 0.5 cc 0.5% marcaine plain, 0.5 cc of 1% Lidocaine, 0.5 cc kenalog 10. Disposition: Patient tolerated procedure well. Injection site dressed with a band-aid.  No follow-ups on file.

## 2023-04-17 ENCOUNTER — Encounter: Payer: Medicare PPO | Admitting: Family Medicine

## 2023-04-20 ENCOUNTER — Other Ambulatory Visit (INDEPENDENT_AMBULATORY_CARE_PROVIDER_SITE_OTHER): Payer: Medicare PPO

## 2023-04-20 DIAGNOSIS — Z794 Long term (current) use of insulin: Secondary | ICD-10-CM | POA: Diagnosis not present

## 2023-04-20 DIAGNOSIS — E1122 Type 2 diabetes mellitus with diabetic chronic kidney disease: Secondary | ICD-10-CM | POA: Diagnosis not present

## 2023-04-20 DIAGNOSIS — E875 Hyperkalemia: Secondary | ICD-10-CM

## 2023-04-20 DIAGNOSIS — E785 Hyperlipidemia, unspecified: Secondary | ICD-10-CM | POA: Diagnosis not present

## 2023-04-20 DIAGNOSIS — E1169 Type 2 diabetes mellitus with other specified complication: Secondary | ICD-10-CM | POA: Diagnosis not present

## 2023-04-20 DIAGNOSIS — N1831 Chronic kidney disease, stage 3a: Secondary | ICD-10-CM | POA: Diagnosis not present

## 2023-04-20 DIAGNOSIS — E538 Deficiency of other specified B group vitamins: Secondary | ICD-10-CM

## 2023-04-20 LAB — COMPREHENSIVE METABOLIC PANEL
ALT: 42 U/L — ABNORMAL HIGH (ref 0–35)
AST: 22 U/L (ref 0–37)
Albumin: 3.9 g/dL (ref 3.5–5.2)
Alkaline Phosphatase: 307 U/L — ABNORMAL HIGH (ref 39–117)
BUN: 22 mg/dL (ref 6–23)
CO2: 26 meq/L (ref 19–32)
Calcium: 9.5 mg/dL (ref 8.4–10.5)
Chloride: 107 meq/L (ref 96–112)
Creatinine, Ser: 0.95 mg/dL (ref 0.40–1.20)
GFR: 58.63 mL/min — ABNORMAL LOW (ref >60.00)
Glucose, Bld: 174 mg/dL — ABNORMAL HIGH (ref 70–99)
Potassium: 5.4 meq/L — ABNORMAL HIGH (ref 3.5–5.1)
Sodium: 142 mEq/L (ref 135–145)
Total Bilirubin: 0.9 mg/dL (ref 0.2–1.2)
Total Protein: 7.2 g/dL (ref 6.0–8.3)

## 2023-04-20 LAB — VITAMIN B12: Vitamin B-12: 263 pg/mL (ref 211–911)

## 2023-04-20 LAB — HEMOGLOBIN A1C: Hgb A1c MFr Bld: 8.3 % — ABNORMAL HIGH (ref 4.6–6.5)

## 2023-04-20 LAB — LIPID PANEL
Cholesterol: 150 mg/dL (ref 0–200)
HDL: 38.3 mg/dL — ABNORMAL LOW (ref >39.00)
LDL Cholesterol: 76 mg/dL (ref 0–99)
NonHDL: 111.53
Total CHOL/HDL Ratio: 4
Triglycerides: 179 mg/dL — ABNORMAL HIGH (ref 0.0–149.0)
VLDL: 35.8 mg/dL (ref 0.0–40.0)

## 2023-04-21 NOTE — Addendum Note (Signed)
Addended by: Damita Lack on: 04/21/2023 11:27 AM   Modules accepted: Orders

## 2023-04-23 ENCOUNTER — Other Ambulatory Visit (INDEPENDENT_AMBULATORY_CARE_PROVIDER_SITE_OTHER): Payer: Medicare PPO

## 2023-04-23 DIAGNOSIS — E875 Hyperkalemia: Secondary | ICD-10-CM | POA: Diagnosis not present

## 2023-04-23 LAB — COMPREHENSIVE METABOLIC PANEL
ALT: 38 U/L — ABNORMAL HIGH (ref 0–35)
AST: 37 U/L (ref 0–37)
Albumin: 3.9 g/dL (ref 3.5–5.2)
Alkaline Phosphatase: 299 U/L — ABNORMAL HIGH (ref 39–117)
BUN: 19 mg/dL (ref 6–23)
CO2: 28 mEq/L (ref 19–32)
Calcium: 9.4 mg/dL (ref 8.4–10.5)
Chloride: 106 mEq/L (ref 96–112)
Creatinine, Ser: 0.92 mg/dL (ref 0.40–1.20)
GFR: 60.93 mL/min (ref >60.00)
Glucose, Bld: 188 mg/dL — ABNORMAL HIGH (ref 70–99)
Potassium: 3.9 mEq/L (ref 3.5–5.1)
Sodium: 142 mEq/L (ref 135–145)
Total Bilirubin: 0.8 mg/dL (ref 0.2–1.2)
Total Protein: 7.6 g/dL (ref 6.0–8.3)

## 2023-04-23 NOTE — Progress Notes (Signed)
No critical labs need to be addressed urgently. We will discuss labs in detail at upcoming office visit.

## 2023-04-27 ENCOUNTER — Ambulatory Visit (INDEPENDENT_AMBULATORY_CARE_PROVIDER_SITE_OTHER): Payer: Medicare PPO | Admitting: Family Medicine

## 2023-04-27 ENCOUNTER — Encounter: Payer: Self-pay | Admitting: Family Medicine

## 2023-04-27 VITALS — BP 124/82 | HR 75 | Temp 97.0°F | Ht 64.25 in | Wt 139.0 lb

## 2023-04-27 DIAGNOSIS — E1169 Type 2 diabetes mellitus with other specified complication: Secondary | ICD-10-CM | POA: Diagnosis not present

## 2023-04-27 DIAGNOSIS — I152 Hypertension secondary to endocrine disorders: Secondary | ICD-10-CM | POA: Diagnosis not present

## 2023-04-27 DIAGNOSIS — E1122 Type 2 diabetes mellitus with diabetic chronic kidney disease: Secondary | ICD-10-CM

## 2023-04-27 DIAGNOSIS — E875 Hyperkalemia: Secondary | ICD-10-CM | POA: Diagnosis not present

## 2023-04-27 DIAGNOSIS — R1084 Generalized abdominal pain: Secondary | ICD-10-CM | POA: Diagnosis not present

## 2023-04-27 DIAGNOSIS — E785 Hyperlipidemia, unspecified: Secondary | ICD-10-CM

## 2023-04-27 DIAGNOSIS — E538 Deficiency of other specified B group vitamins: Secondary | ICD-10-CM | POA: Diagnosis not present

## 2023-04-27 DIAGNOSIS — Z794 Long term (current) use of insulin: Secondary | ICD-10-CM

## 2023-04-27 DIAGNOSIS — Z0000 Encounter for general adult medical examination without abnormal findings: Secondary | ICD-10-CM | POA: Diagnosis not present

## 2023-04-27 DIAGNOSIS — E1159 Type 2 diabetes mellitus with other circulatory complications: Secondary | ICD-10-CM | POA: Diagnosis not present

## 2023-04-27 DIAGNOSIS — R748 Abnormal levels of other serum enzymes: Secondary | ICD-10-CM | POA: Diagnosis not present

## 2023-04-27 DIAGNOSIS — E1121 Type 2 diabetes mellitus with diabetic nephropathy: Secondary | ICD-10-CM

## 2023-04-27 DIAGNOSIS — N1832 Chronic kidney disease, stage 3b: Secondary | ICD-10-CM

## 2023-04-27 DIAGNOSIS — E1143 Type 2 diabetes mellitus with diabetic autonomic (poly)neuropathy: Secondary | ICD-10-CM

## 2023-04-27 NOTE — Assessment & Plan Note (Addendum)
Associated with diabetes.  Followed by renal, Dr. Thedore Mins.

## 2023-04-27 NOTE — Assessment & Plan Note (Signed)
Resolved on recheck with water and potassium avoidance.

## 2023-04-27 NOTE — Assessment & Plan Note (Signed)
Acute, unclear cause  New med ozempic  Will eval further with RUQ [place].

## 2023-04-27 NOTE — Patient Instructions (Signed)
We will contact you to set up US liver.

## 2023-04-27 NOTE — Progress Notes (Signed)
Patient ID: Wanda [name], female    DOB: August 18, 1945, 77 y.o.   MRN: [id number]  This visit was conducted in person.  BP 124/82   Pulse 75   Temp (!) 97 F (36.1 C) (Temporal)   Ht 5' 4.25" (1.632 m)   Wt 139 lb (63 kg)   SpO2 98%   BMI 23.67 kg/m    CC:  Chief Complaint  Patient presents with   Annual Exam    Subjective:   HPI: Wanda [name] is a 77 y.o. female presenting on 04/27/2023 for Annual Exam  The patient presents for  complete physical and review of chronic health problems. He/She also has the following acute concerns today: She feels she has been more tired than she should over the last 2 month.  No SOB, no weakness.  Minimal insomnia at this point... sleeping  8 hours. No snoring.  Some  nocturia with OAB, has not tried anything for this in past. Followed by Dr. [facility].  The patient saw a LPN or RN for medicare wellness visit. 12/2022  Prevention and wellness was reviewed in detail. Note reviewed and important notes copied below.   Elevated  alk phos 299-307  Diffuse abd pain, after eating, lasts few hours  Does have some bloating or fullness even before eating  She still has her gallbladder.  03/27/2023 US pelvic nml.  New ozempic 0.25 mg weekly in last 4 weeks.   Hypertension: No longer on lisinopril  BP Readings from Last 3 Encounters:  04/27/23 124/82  04/01/23 136/82  03/18/23 130/72  Using medication without problems or lightheadedness:  none Chest pain with exertion: none Edema:none Short of breath: none Average home BPs: Other issues:  Diabetes:   Followed by Dr. Lonzo Cloud. On Metfomrin xr 2 tablets twice daily, Tresiba new start insulin in 05/2022. 12 units daily. Prandin 1 mg three times daily Was Trulicity stopped given weight loss. Lab Results  Component Value Date   HGBA1C 8.3 (H) 04/20/2023  Using medications without difficulties: Hypoglycemic episodes: none Hyperglycemic episodes: 20-400 after lunch Feet problems:  none Blood Sugars averaging: Dexcom: FBS 111 eye exam within last year: yes Associated with neuropathy and   Elevated Cholesterol:  At goal LDL <100.Marland Kitchen on pravastatin 20 mg daily. Lab Results  Component Value Date   CHOL 150 04/20/2023   HDL 38.30 (L) 04/20/2023   LDLCALC 76 04/20/2023   LDLDIRECT 90.0 04/01/2023   TRIG 179.0 (H) 04/20/2023   CHOLHDL 4 04/20/2023  Using medications without problems: Muscle aches:  Diet compliance: moderate Exercise: off and on Other complaints:  B12 deficiency: Resolved on supplementation on recent labs   Patient Care Team: Excell Seltzer, MD as PCP - General (Family Medicine) Antony Contras, MD as Consulting Physician (Ophthalmology) [facility], Konrad Dolores, MD as Consulting Physician (Endocrinology) Meredith Pel, NP as Nurse Practitioner (Gastroenterology) Kathyrn Sheriff, [facility] (Inactive) as Pharmacist (Pharmacist)    Relevant past medical, surgical, family and social history reviewed and updated as indicated. Interim medical history since our last visit reviewed. Allergies and medications reviewed and updated. Outpatient Medications Prior to Visit  Medication Sig Dispense Refill   Accu-Chek Softclix Lancets lancets 2 (two) times daily.     acetaminophen (TYLENOL) 325 MG tablet Take 2 tablets by mouth as needed.     atorvastatin (LIPITOR) 20 MG tablet Take 1 tablet (20 mg total) by mouth daily. 90 tablet 3   Blood Glucose Monitoring Suppl (ACCU-CHEK GUIDE ME) w/Device KIT USE AS DIRECTED  TO CHECK BLOOD SUGAR TWICE DAILY     insulin degludec (TRESIBA FLEXTOUCH) 100 UNIT/ML FlexTouch Pen Inject 10 Units into the skin daily. 15 mL 3   Insulin Pen Needle 32G X 4 MM MISC 1 Device by Does not apply route daily in the afternoon. 100 each 3   metFORMIN (GLUCOPHAGE-XR) 500 MG 24 hr tablet Take 2 tablets (1,000 mg total) by mouth daily with breakfast. 180 tablet 3   omeprazole (PRILOSEC) 20 MG capsule Take 1 capsule (20 mg total) by mouth  every other day. 45 capsule 1   repaglinide (PRANDIN) 2 MG tablet Take 1 tablet (2 mg total) by mouth 3 (three) times daily before meals. 90 tablet 3   Semaglutide,0.25 or 0.5MG /DOS, 2 MG/3ML SOPN Inject 0.5 mg into the skin once a week. 9 mL 2   busPIRone (BUSPAR) 15 MG tablet Take 0.5 tablets (7.5 mg total) by mouth 2 (two) times daily. (Patient not taking: Reported on 04/27/2023) 90 tablet 1   triamcinolone cream (KENALOG) 0.1 % Apply 1 Application topically 2 (two) times daily. (Patient not taking: Reported on 04/27/2023) 30 g 0   No facility-administered medications prior to visit.     Per HPI unless specifically indicated in ROS section below Review of Systems  Constitutional:  Negative for fatigue and fever.  HENT:  Negative for congestion.   Eyes:  Negative for pain.  Respiratory:  Negative for cough and shortness of breath.   Cardiovascular:  Negative for chest pain, palpitations and leg swelling.  Gastrointestinal:  Negative for abdominal pain.  Genitourinary:  Negative for dysuria and vaginal bleeding.  Musculoskeletal:  Negative for back pain.  Neurological:  Negative for syncope, light-headedness and headaches.  Psychiatric/Behavioral:  Negative for dysphoric mood.    Objective:  BP 124/82   Pulse 75   Temp (!) 97 F (36.1 C) (Temporal)   Ht 5' 4.25" (1.632 m)   Wt 139 lb (63 kg)   SpO2 98%   BMI 23.67 kg/m   Wt Readings from Last 3 Encounters:  04/27/23 139 lb (63 kg)  04/01/23 144 lb 12.8 oz (65.7 kg)  03/18/23 146 lb (66.2 kg)      Physical Exam Constitutional:      General: She is not in acute distress.    Appearance: Normal appearance. She is well-developed. She is not ill-appearing or toxic-appearing.  HENT:     Head: Normocephalic.     Right Ear: Hearing, tympanic membrane, ear canal and external ear normal. Tympanic membrane is not erythematous, retracted or bulging.     Left Ear: Hearing, tympanic membrane, ear canal and external ear normal. Tympanic  membrane is not erythematous, retracted or bulging.     Nose: No mucosal edema or rhinorrhea.     Right Sinus: No maxillary sinus tenderness or frontal sinus tenderness.     Left Sinus: No maxillary sinus tenderness or frontal sinus tenderness.     Mouth/Throat:     Pharynx: Uvula midline.  Eyes:     General: Lids are normal. Lids are everted, no foreign bodies appreciated.     Conjunctiva/sclera: Conjunctivae normal.     Pupils: Pupils are equal, round, and reactive to light.  Neck:     Thyroid: No thyroid mass or thyromegaly.     Vascular: No carotid bruit.     Trachea: Trachea normal.  Cardiovascular:     Rate and Rhythm: Normal rate and regular rhythm.     Pulses: Normal pulses.     Heart  sounds: Normal heart sounds, S1 normal and S2 normal. No murmur heard.    No friction rub. No gallop.  Pulmonary:     Effort: Pulmonary effort is normal. No tachypnea or respiratory distress.     Breath sounds: Normal breath sounds. No decreased breath sounds, wheezing, rhonchi or rales.  Abdominal:     General: Bowel sounds are normal.     Palpations: Abdomen is soft.     Tenderness: There is no abdominal tenderness.  Musculoskeletal:     Cervical back: Normal range of motion and neck supple.  Skin:    General: Skin is warm and dry.     Findings: No rash.  Neurological:     Mental Status: She is alert.  Psychiatric:        Mood and Affect: Mood is not anxious or depressed.        Speech: Speech normal.        Behavior: Behavior normal. Behavior is cooperative.        Thought Content: Thought content normal.        Judgment: Judgment normal.       Results for orders placed or performed in visit on 04/23/23  Comprehensive metabolic panel  Result Value Ref Range   Sodium 142 135 - 145 mEq/L   Potassium 3.9 3.5 - 5.1 mEq/L   Chloride 106 96 - 112 mEq/L   CO2 28 19 - 32 mEq/L   Glucose, Bld 188 (H) 70 - 99 mg/dL   BUN 19 6 - 23 mg/dL   Creatinine, Ser 3.66 0.40 - 1.20 mg/dL    Total Bilirubin 0.8 0.2 - 1.2 mg/dL   Alkaline Phosphatase 299 (H) 39 - 117 U/L   AST 37 0 - 37 U/L   ALT 38 (H) 0 - 35 U/L   Total Protein 7.6 6.0 - 8.3 g/dL   Albumin 3.9 3.5 - 5.2 g/dL   GFR 44.03 >47.42 mL/min   Calcium 9.4 8.4 - 10.5 mg/dL    Assessment and Plan The patient's preventative maintenance and recommended screening tests for an annual wellness exam were reviewed in full today. Brought up to date unless services declined.  Counselled on the importance of diet, exercise, and its role in overall health and mortality. The patient's FH and SH was reviewed, including their home life, tobacco status, and drug and alcohol status.   Vaccines: Due for Tdap, flu, COVID.  Consider shingles vaccine.  Up-to-date with pneumonia vaccines Pap/DVE: Not indicated due to age Mammo: Normal June 16, 2022, repeat November 2024 Bone Density: Osteopenia May 2022, due for reevaluation in 2 to 5 years Colon: Cologuard completed September 2019,  no further indicated Smoking Status: None ETOH/ drug use: None/none  Hep C: Done   Routine general medical examination at a health care facility  Hypertension associated with diabetes [facility]) Assessment & Plan:  Well controlled on no medication.    HYPERKALEMIA Assessment & Plan: Resolved on recheck with water and potassium avoidance.   Diabetic nephropathy associated with type 2 diabetes mellitus (HCC) Assessment & Plan: Associated with diabetes.  Followed by renal, Dr. Thedore Mins.   Hyperlipidemia associated with type 2 diabetes mellitus (HCC) Assessment & Plan: Stable, chronic.  Continue current medication.  Pravastatin 20 mg daily   Diabetic autonomic neuropathy associated with type 2 diabetes mellitus (HCC) Assessment & Plan:  Associated with DM, no treatment required as it is mild.   Type 2 diabetes mellitus with stage 3b chronic kidney disease, with long-term current use  of insulin (HCC) Assessment & Plan: Chronic,  uncontrolled, followed by endocrinology... has upcoming appt.  On metfomrin  Trieseba 10 units daily Ozempic 0.25 weekly.. new. Repaglinide  2 mg  BID.Marland Kitchen supposed to be taking every meal.. she plans to increase.   Type 2 diabetes mellitus with hyperglycemia, with long-term current use of insulin (HCC)  B12 deficiency Assessment & Plan: Chronic, resolved with supplementation on recent labs   Elevated alkaline phosphatase level Assessment & Plan:  Acute, unclear cause  New med ozempic  Will eval further with RUQ [place].  Orders: -     US ABDOMEN LIMITED RUQ (LIVER/GB); Future  Generalized abdominal pain -     US ABDOMEN LIMITED RUQ (LIVER/GB); Future     Return in about 1 year (around 04/26/2024) for phone AMW,  fasting labs then CPE with me.   Kerby Nora, MD

## 2023-04-27 NOTE — Assessment & Plan Note (Signed)
Well controlled on no medication

## 2023-04-27 NOTE — Assessment & Plan Note (Addendum)
Chronic, uncontrolled, followed by endocrinology... has upcoming appt.  On metfomrin  Trieseba 10 units daily Ozempic 0.25 weekly.. new. Repaglinide  2 mg  BID.Marland Kitchen supposed to be taking every meal.. she plans to increase.

## 2023-04-27 NOTE — Assessment & Plan Note (Signed)
Stable, chronic.  Continue current medication.  Pravastatin 20 mg daily

## 2023-04-27 NOTE — Assessment & Plan Note (Signed)
Associated with DM, no treatment required as it is mild.

## 2023-04-27 NOTE — Assessment & Plan Note (Signed)
Chronic, resolved with supplementation on recent labs

## 2023-04-30 ENCOUNTER — Encounter: Payer: Self-pay | Admitting: *Deleted

## 2023-05-08 DIAGNOSIS — Z794 Long term (current) use of insulin: Secondary | ICD-10-CM | POA: Diagnosis not present

## 2023-05-08 DIAGNOSIS — E1165 Type 2 diabetes mellitus with hyperglycemia: Secondary | ICD-10-CM | POA: Diagnosis not present

## 2023-05-18 ENCOUNTER — Ambulatory Visit
Admission: RE | Admit: 2023-05-18 | Discharge: 2023-05-18 | Disposition: A | Discharge status: Home / Self Care | Payer: Medicare PPO | Source: Ambulatory Visit | Attending: Family Medicine | Admitting: Family Medicine

## 2023-05-18 DIAGNOSIS — R748 Abnormal levels of other serum enzymes: Secondary | ICD-10-CM | POA: Insufficient documentation

## 2023-05-18 DIAGNOSIS — R1084 Generalized abdominal pain: Secondary | ICD-10-CM | POA: Diagnosis not present

## 2023-05-19 ENCOUNTER — Other Ambulatory Visit: Payer: Self-pay | Admitting: Family Medicine

## 2023-05-19 ENCOUNTER — Ambulatory Visit: Payer: Medicare PPO | Admitting: Podiatry

## 2023-05-19 DIAGNOSIS — R748 Abnormal levels of other serum enzymes: Secondary | ICD-10-CM

## 2023-05-20 ENCOUNTER — Other Ambulatory Visit: Payer: Medicare PPO

## 2023-05-20 ENCOUNTER — Other Ambulatory Visit (INDEPENDENT_AMBULATORY_CARE_PROVIDER_SITE_OTHER): Payer: Medicare PPO

## 2023-05-20 ENCOUNTER — Other Ambulatory Visit: Payer: Self-pay | Admitting: Family Medicine

## 2023-05-20 ENCOUNTER — Encounter: Payer: Self-pay | Admitting: Internal Medicine

## 2023-05-20 DIAGNOSIS — R748 Abnormal levels of other serum enzymes: Secondary | ICD-10-CM | POA: Diagnosis not present

## 2023-05-20 MED ORDER — OMEPRAZOLE 20 MG PO CPDR: 20.0000 mg | DELAYED_RELEASE_CAPSULE | ORAL | 1 refills | Status: DC

## 2023-05-20 MED ORDER — BUSPIRONE HCL 15 MG PO TABS: 7.5000 mg | ORAL_TABLET | Freq: Two times a day (BID) | ORAL | 1 refills | Status: DC

## 2023-05-20 NOTE — Addendum Note (Signed)
Addended by: Alvina Chou on: 05/20/2023 07:17 AM   Modules accepted: Orders

## 2023-05-20 NOTE — Addendum Note (Signed)
Addended by: Damita Lack on: 05/20/2023 03:31 PM   Modules accepted: Orders

## 2023-05-21 ENCOUNTER — Other Ambulatory Visit: Payer: Self-pay | Admitting: Family Medicine

## 2023-05-21 DIAGNOSIS — R1011 Right upper quadrant pain: Secondary | ICD-10-CM

## 2023-05-21 DIAGNOSIS — R7989 Other specified abnormal findings of blood chemistry: Secondary | ICD-10-CM

## 2023-05-21 LAB — HEPATIC FUNCTION PANEL
ALT: 92 U/L — ABNORMAL HIGH (ref 0–35)
AST: 70 U/L — ABNORMAL HIGH (ref 0–37)
Albumin: 4.2 g/dL (ref 3.5–5.2)
Alkaline Phosphatase: 237 U/L — ABNORMAL HIGH (ref 39–117)
Bilirubin, Direct: 0.1 mg/dL (ref 0.0–0.3)
Total Bilirubin: 0.4 mg/dL (ref 0.2–1.2)
Total Protein: 7.7 g/dL (ref 6.0–8.3)

## 2023-05-21 LAB — LIPASE: Lipase: 53 U/L (ref 11.0–59.0)

## 2023-05-21 LAB — GAMMA GT: GGT: 221 U/L — ABNORMAL HIGH (ref 7–51)

## 2023-05-21 LAB — ALKALINE PHOSPHATASE: Alkaline Phosphatase: 237 U/L — ABNORMAL HIGH (ref 39–117)

## 2023-05-22 ENCOUNTER — Other Ambulatory Visit: Payer: Self-pay | Admitting: Family Medicine

## 2023-05-22 ENCOUNTER — Other Ambulatory Visit (INDEPENDENT_AMBULATORY_CARE_PROVIDER_SITE_OTHER): Payer: Medicare PPO

## 2023-05-22 ENCOUNTER — Telehealth: Payer: Self-pay | Admitting: Family Medicine

## 2023-05-22 DIAGNOSIS — R748 Abnormal levels of other serum enzymes: Secondary | ICD-10-CM | POA: Diagnosis not present

## 2023-05-22 LAB — CBC WITH DIFFERENTIAL/PLATELET
Basophils Absolute: 0.1 10*3/uL (ref 0.0–0.1)
Basophils Relative: 0.9 % (ref 0.0–3.0)
Eosinophils Absolute: 0.2 10*3/uL (ref 0.0–0.7)
Eosinophils Relative: 3.8 % (ref 0.0–5.0)
HCT: 40.4 % (ref 36.0–46.0)
Hemoglobin: 12.9 g/dL (ref 12.0–15.0)
Lymphocytes Relative: 33.4 % (ref 12.0–46.0)
Lymphs Abs: 2.2 10*3/uL (ref 0.7–4.0)
MCHC: 31.8 g/dL (ref 30.0–36.0)
MCV: 94.2 fL (ref 78.0–100.0)
Monocytes Absolute: 0.9 10*3/uL (ref 0.1–1.0)
Monocytes Relative: 13.3 % — ABNORMAL HIGH (ref 3.0–12.0)
Neutro Abs: 3.1 10*3/uL (ref 1.4–7.7)
Neutrophils Relative %: 48.6 % (ref 43.0–77.0)
Platelets: 281 10*3/uL (ref 150.0–400.0)
RBC: 4.29 Mil/uL (ref 3.87–5.11)
RDW: 13.2 % (ref 11.5–15.5)
WBC: 6.5 10*3/uL (ref 4.0–10.5)

## 2023-05-22 NOTE — Telephone Encounter (Signed)
LVM for the patient to come in for additional lab test.

## 2023-05-23 LAB — HEPATITIS PANEL, ACUTE
Hep A IgM: NONREACTIVE
Hep B C IgM: NONREACTIVE
Hepatitis B Surface Ag: NONREACTIVE
Hepatitis C Ab: NONREACTIVE

## 2023-05-26 ENCOUNTER — Ambulatory Visit
Admission: RE | Admit: 2023-05-26 | Discharge: 2023-05-26 | Disposition: A | Discharge status: Home / Self Care | Payer: Medicare PPO | Source: Ambulatory Visit | Attending: Family Medicine | Admitting: Family Medicine

## 2023-05-26 DIAGNOSIS — R1013 Epigastric pain: Secondary | ICD-10-CM | POA: Diagnosis not present

## 2023-05-26 DIAGNOSIS — N281 Cyst of kidney, acquired: Secondary | ICD-10-CM | POA: Diagnosis not present

## 2023-05-26 DIAGNOSIS — R1011 Right upper quadrant pain: Secondary | ICD-10-CM

## 2023-05-26 DIAGNOSIS — R7989 Other specified abnormal findings of blood chemistry: Secondary | ICD-10-CM

## 2023-05-26 MED ORDER — IOPAMIDOL (ISOVUE-300) INJECTION 61%
100.0000 mL | Freq: Once | INTRAVENOUS | Status: AC | PRN
  Administered 2023-05-26: 100 mL via INTRAVENOUS

## 2023-06-01 ENCOUNTER — Other Ambulatory Visit: Payer: Self-pay | Admitting: Family Medicine

## 2023-06-01 ENCOUNTER — Encounter: Payer: Self-pay | Admitting: Family Medicine

## 2023-06-01 DIAGNOSIS — Z1231 Encounter for screening mammogram for malignant neoplasm of breast: Secondary | ICD-10-CM

## 2023-06-07 DIAGNOSIS — Z794 Long term (current) use of insulin: Secondary | ICD-10-CM | POA: Diagnosis not present

## 2023-06-07 DIAGNOSIS — E1165 Type 2 diabetes mellitus with hyperglycemia: Secondary | ICD-10-CM | POA: Diagnosis not present

## 2023-06-09 ENCOUNTER — Encounter: Payer: Self-pay | Admitting: Family Medicine

## 2023-06-10 ENCOUNTER — Other Ambulatory Visit: Payer: Self-pay | Admitting: Family Medicine

## 2023-06-10 DIAGNOSIS — Z794 Long term (current) use of insulin: Secondary | ICD-10-CM

## 2023-06-11 ENCOUNTER — Encounter: Payer: Self-pay | Admitting: *Deleted

## 2023-06-29 ENCOUNTER — Ambulatory Visit
Admission: RE | Admit: 2023-06-29 | Discharge: 2023-06-29 | Disposition: A | Discharge status: Home / Self Care | Payer: Medicare PPO | Source: Ambulatory Visit | Attending: Family Medicine | Admitting: Family Medicine

## 2023-06-29 DIAGNOSIS — Z1231 Encounter for screening mammogram for malignant neoplasm of breast: Secondary | ICD-10-CM

## 2023-07-07 DIAGNOSIS — E1165 Type 2 diabetes mellitus with hyperglycemia: Secondary | ICD-10-CM | POA: Diagnosis not present

## 2023-07-07 DIAGNOSIS — Z794 Long term (current) use of insulin: Secondary | ICD-10-CM | POA: Diagnosis not present

## 2023-07-30 ENCOUNTER — Ambulatory Visit: Payer: Medicare PPO | Admitting: Internal Medicine

## 2023-08-06 ENCOUNTER — Ambulatory Visit: Payer: Medicare PPO | Admitting: Dietician

## 2023-08-06 DIAGNOSIS — E1165 Type 2 diabetes mellitus with hyperglycemia: Secondary | ICD-10-CM | POA: Diagnosis not present

## 2023-08-06 DIAGNOSIS — Z794 Long term (current) use of insulin: Secondary | ICD-10-CM | POA: Diagnosis not present

## 2023-08-10 ENCOUNTER — Ambulatory Visit: Payer: Medicare PPO | Admitting: Dietician

## 2023-08-14 ENCOUNTER — Encounter: Payer: Self-pay | Admitting: Dietician

## 2023-08-14 ENCOUNTER — Encounter: Payer: Medicare PPO | Attending: Internal Medicine | Admitting: Dietician

## 2023-08-14 VITALS — Ht 64.0 in | Wt 138.0 lb

## 2023-08-14 DIAGNOSIS — E1165 Type 2 diabetes mellitus with hyperglycemia: Secondary | ICD-10-CM | POA: Diagnosis not present

## 2023-08-14 DIAGNOSIS — Z794 Long term (current) use of insulin: Secondary | ICD-10-CM | POA: Diagnosis not present

## 2023-08-14 NOTE — Patient Instructions (Addendum)
 Start a Vitamin B-12 supplement daily - choose one that is sublingual or dissolvable.  Consider getting your Vitamin B-12 rechecked.  Consider vitamin D  (2000 units/50 mg) as your vitamin D  was very low in 2021.  Consider getting your vitamin D  rechecked.  Discuss your liver labs with your doctor as you stated that these increased when you started your cholesterol medication.  Take you medication consistently - set an alarm, keep some in your purse  Aim to be active most every day for 30 minutes.

## 2023-08-14 NOTE — Progress Notes (Signed)
 Diabetes Self-Management Education  Visit Type: First/Initial  Appt. Start Time: 1045 Appt. End Time: 1200  08/14/2023  [name], identified by name and date of birth, is a 78 y.o. female with a diagnosis of Diabetes: Type 2.   ASSESSMENT Patient is here today alone.  She was last seen by an RD in another office in 2022 and for DSME classes in 2013.  Referral:  Type 2 Diabetes  History includes:  Type 2 Diabetes (1989), HLD, HTN, neuropathy Medications include:  Tresiba  10 units q am, metformin  ([phone]), repaglinide , Ozempic  (hasn't taken for the past 6 weeks), took vitamin B-12 shots in the past Labs noted to include:  A1C 8.3% 04/20/2023, Alkaline phosphate 237, AST 70, ALT 92 on 05/20/2023, BUN 19, Creatinine 0.92 04/23/2023, states that liver labs increased after starting atorvastatin  which she continues. Vitamin B-12 263 04/20/2023 with previous history of deficiency (154 01/2022) and vitamin B-12 shots. Vitamin D  21 08/23/2019 Lipid Panel     Component Value Date/Time   CHOL 150 04/20/2023 0926   TRIG 179.0 (H) 04/20/2023 0926   HDL 38.30 (L) 04/20/2023 0926   CHOLHDL 4 04/20/2023 0926   VLDL 35.8 04/20/2023 0926   LDLCALC 76 04/20/2023 0926   LDLDIRECT 90.0 04/01/2023 0939   CGM:  Dexcom G7  CGM Results from download: 08/14/2023  % Time CGM active:   50 %   (Goal >70%)  Average glucose:   167 mg/dL for 14 days  Glucose management indicator:   N/a %  Time in range (70-180 mg/dL):   61 %   (Goal >29%)  Time High (181-250 mg/dL):   27 %   (Goal < 74%)  Time Very High (>250 mg/dL):    12 %   (Goal < 5%)  Time Low (54-69 mg/dL):   0 %   (Goal <5%)  Time Very Low (<54 mg/dL):   0 %   (Goal <8%)  %CV (glucose variability)    65 %  (Goal <36%)  Fasting today 99  64 138 lbs 08/14/2023 - stable  Patient has a handicapped son who lives with her (he does not require care). 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  She continues to engineer, technical sales. Enjoys eating out. She  does not exercise.  Height 5' 4 (1.626 m), weight 138 lb (62.6 kg). Body mass index is 23.69 kg/m.   Diabetes Self-Management Education - 08/14/23 1142       Visit Information   Visit Type First/Initial      Initial Visit   Diabetes Type Type 2    Date Diagnosed 1989    Are you currently following a meal plan? No    Are you taking your medications as prescribed? Yes      Health Coping   How would you rate your overall health? Good      Psychosocial Assessment   Patient Belief/Attitude about Diabetes Defeat/Burnout   but motivated   What is the hardest part about your diabetes right now, causing you the most concern, or is the most worrisome to you about your diabetes?   Taking/obtaining medications    Self-care barriers None    Self-management support Doctor's office    Other persons present Patient    Patient Concerns Nutrition/Meal planning;Problem Solving;Healthy Lifestyle;Glycemic Control    Special Needs None    Preferred Learning Style No preference indicated    Learning Readiness Ready    How often do you need to have someone help you when  you read instructions, pamphlets, or other written materials from your doctor or pharmacy? 1 - Never    What is the last grade level you completed in school? Master's degree      Pre-Education Assessment   Patient understands the diabetes disease and treatment process. Needs Review    Patient understands incorporating nutritional management into lifestyle. Needs Review    Patient undertands incorporating physical activity into lifestyle. Needs Review    Patient understands using medications safely. Needs Review    Patient understands monitoring blood glucose, interpreting and using results Needs Review    Patient understands prevention, detection, and treatment of acute complications. Needs Review    Patient understands prevention, detection, and treatment of chronic complications. Needs Review    Patient understands how to develop  strategies to address psychosocial issues. Needs Review    Patient understands how to develop strategies to promote health/change behavior. Needs Review      Complications   Last HgB A1C per patient/outside source 8.3 %   04/20/23   How often do you check your blood sugar? > 4 times/day    Fasting Blood glucose range (mg/dL) 29-870    Postprandial Blood glucose range (mg/dL) 819-799;>799;869-820    Number of hypoglycemic episodes per month 2    Can you tell when your blood sugar is low? Yes    What do you do if your blood sugar is low? juice    Number of hyperglycemic episodes ( >200mg /dL): Daily    Can you tell when your blood sugar is high? Yes    What do you do if your blood sugar is high? drinks water    Have you had a dilated eye exam in the past 12 months? Yes    Have you had a dental exam in the past 12 months? Yes      Dietary Intake   Breakfast eggs, grits, toast OR waffle with sugar free syrup or PB and jelly with the waffle    Lunch salad with cheese    Snack (afternoon) walnuts (that she has to crack) OR apple with PB OR berries    Dinner 1/4 club sandwich with ham, turkey, roast beef, 1/2 baked potato with bacon, butter, and sour cream    Beverage(s) water, rare regular soda      Activity / Exercise   Activity / Exercise Type ADL's    How many days per week do you exercise? 0    How many minutes per day do you exercise? 0    Total minutes per week of exercise 0      Patient Education   Previous Diabetes Education Yes (please comment)    Disease Pathophysiology Explored patient's options for treatment of their diabetes    Healthy Eating Food label reading, portion sizes and measuring food.    Being Active Role of exercise on diabetes management, blood pressure control and cardiac health.    Medications Reviewed patients medication for diabetes, action, purpose, timing of dose and side effects.    Monitoring Taught/evaluated CGM (comment)    Acute complications  Discussed and identified patients' prevention, symptoms, and treatment of hyperglycemia.;Taught prevention, symptoms, and  treatment of hypoglycemia - the 15 rule.    Diabetes Stress and Support Identified and addressed patients feelings and concerns about diabetes;Worked with patient to identify barriers to care and solutions;Role of stress on diabetes      Individualized Goals (developed by patient)   Nutrition General guidelines for healthy choices and portions discussed  Physical Activity Exercise 5-7 days per week;30 minutes per day    Medications take my medication as prescribed    Monitoring  Consistenly use CGM    Problem Solving Medication consistency;Addressing barriers to behavior change    Reducing Risk examine blood glucose patterns;do foot checks daily;treat hypoglycemia with 15 grams of carbs if blood glucose less than 70mg /dL      Post-Education Assessment   Patient understands the diabetes disease and treatment process. Comprehends key points    Patient understands incorporating nutritional management into lifestyle. Comprehends key points    Patient undertands incorporating physical activity into lifestyle. Comprehends key points    Patient understands using medications safely. Comphrehends key points    Patient understands monitoring blood glucose, interpreting and using results Comprehends key points    Patient understands prevention, detection, and treatment of acute complications. Comprehends key points    Patient understands prevention, detection, and treatment of chronic complications. Comprehends key points    Patient understands how to develop strategies to address psychosocial issues. Comprehends key points    Patient understands how to develop strategies to promote health/change behavior. Needs Review      Outcomes   Expected Outcomes Demonstrated interest in learning. Expect positive outcomes    Future DMSE 2 months    Program Status Not Completed              Individualized Plan for Diabetes Self-Management Training:   Learning Objective:  Patient will have a greater understanding of diabetes self-management. Patient education plan is to attend individual and/or group sessions per assessed needs and concerns.   Plan:   Patient Instructions  Start a Vitamin B-12 supplement daily - choose one that is sublingual or dissolvable.  Consider getting your Vitamin B-12 rechecked.  Consider vitamin D  (2000 units/50 mg) as your vitamin D  was very low in 2021.  Consider getting your vitamin D  rechecked.  Discuss your liver labs with your doctor as you stated that these increased when you started your cholesterol medication.  Take you medication consistently - set an alarm, keep some in your purse  Aim to be active most every day for 30 minutes.    Expected Outcomes:  Demonstrated interest in learning. Expect positive outcomes  Education material provided: ADA - How to Thrive: A Guide for Your Journey with Diabetes, Meal plan card, and Diabetes Resources  If problems or questions, patient to contact team via:  Phone  Future DSME appointment: 2 months

## 2023-08-22 ENCOUNTER — Other Ambulatory Visit: Payer: Self-pay | Admitting: Internal Medicine

## 2023-08-24 ENCOUNTER — Ambulatory Visit: Payer: Self-pay | Admitting: Family Medicine

## 2023-08-24 NOTE — Telephone Encounter (Signed)
Copied from CRM [id number]. Topic: Clinical - Pink Word Triage >> Aug 24, 2023  8:31 AM Fredrich Romans wrote: Reason for Triage: cough,coughing up green mucus.low grade fever a few days ago,none now   Chief Complaint: Productive cough x 5 days Symptoms: greem phlegn Frequency: constant Pertinent Negatives: Patient denies fever Disposition: [] ED /[] Urgent Care (no appt availability in office) / [] Appointment(In office/virtual)/ []  [facility]/ [x] Home Care/ [] Refused Recommended Disposition /[] [facility]/ []  Follow-up with PCP Additional Notes: Patient reports cough x 6 days, temporary relief with Cough syrup. Home Care advice givven.   Reason for Disposition  Cough  Answer Assessment - Initial Assessment Questions 1. ONSET: "When did the cough begin?"      Started 5 days ago  2. SEVERITY: "How bad is the cough today?"      Seems to be getting worse  3. SPUTUM: "Describe the color of your sputum" (none, dry cough; clear, white, yellow, green)     Green mucus  4. HEMOPTYSIS: "Are you coughing up any blood?" If so ask: "How much?" (flecks, streaks, tablespoons, etc.)     No blood  5. DIFFICULTY BREATHING: "Are you having difficulty breathing?" If Yes, ask: "How bad is it?" (e.g., mild, moderate, severe)    - MILD: No SOB at rest, mild SOB with walking, speaks normally in sentences, can lie down, no retractions, pulse < 100.    - MODERATE: SOB at rest, SOB with minimal exertion and prefers to sit, cannot lie down flat, speaks in phrases, mild retractions, audible wheezing, pulse 100-120.    - SEVERE: Very SOB at rest, speaks in single words, struggling to breathe, sitting hunched forward, retractions, pulse > 120      No difficulty breathing  6. FEVER: "Do you have a fever?" If Yes, ask: "What is your temperature, how was it measured, and when did it start?"     Had a fever over the weekend  7. CARDIAC HISTORY: "Do you have any history of heart disease?" (e.g., heart  attack, congestive heart failure)      No  8. LUNG HISTORY: "Do you have any history of lung disease?"  (e.g., pulmonary embolus, asthma, emphysema)     No  9. PE RISK FACTORS: "Do you have a history of blood clots?" (or: recent major surgery, recent prolonged travel, bedridden)     No  10. OTHER SYMPTOMS: "Do you have any other symptoms?" (e.g., runny nose, wheezing, chest pain)       Back pain; sneezing  11. PREGNANCY: "Is there any chance you are pregnant?" "When was your last menstrual period?"       N/a  12. TRAVEL: "Have you traveled out of the country in the last month?" (e.g., travel history, exposures)       No  Protocols used: Cough - Acute Productive-A-AH

## 2023-08-24 NOTE — Telephone Encounter (Signed)
Prandin refill request complete

## 2023-08-25 ENCOUNTER — Ambulatory Visit: Payer: Medicare PPO | Admitting: Family

## 2023-08-25 ENCOUNTER — Encounter: Payer: Self-pay | Admitting: Family

## 2023-08-25 VITALS — BP 118/76 | HR 78 | Temp 98.6°F | Ht 63.5 in | Wt 138.2 lb

## 2023-08-25 DIAGNOSIS — R509 Fever, unspecified: Secondary | ICD-10-CM

## 2023-08-25 DIAGNOSIS — J069 Acute upper respiratory infection, unspecified: Secondary | ICD-10-CM | POA: Diagnosis not present

## 2023-08-25 DIAGNOSIS — R059 Cough, unspecified: Secondary | ICD-10-CM

## 2023-08-25 DIAGNOSIS — M62838 Other muscle spasm: Secondary | ICD-10-CM

## 2023-08-25 DIAGNOSIS — Z20822 Contact with and (suspected) exposure to covid-19: Secondary | ICD-10-CM

## 2023-08-25 DIAGNOSIS — R051 Acute cough: Secondary | ICD-10-CM | POA: Diagnosis not present

## 2023-08-25 DIAGNOSIS — R091 Pleurisy: Secondary | ICD-10-CM | POA: Diagnosis not present

## 2023-08-25 DIAGNOSIS — R0602 Shortness of breath: Secondary | ICD-10-CM | POA: Diagnosis not present

## 2023-08-25 LAB — POCT INFLUENZA A/B
Influenza A, POC: NEGATIVE
Influenza B, POC: NEGATIVE

## 2023-08-25 LAB — POC COVID19 BINAXNOW: SARS Coronavirus 2 Ag: NEGATIVE

## 2023-08-25 MED ORDER — BENZONATATE 200 MG PO CAPS: 200.0000 mg | ORAL_CAPSULE | Freq: Two times a day (BID) | ORAL | 0 refills | Status: DC | PRN

## 2023-08-25 MED ORDER — CYCLOBENZAPRINE HCL 5 MG PO TABS
5.0000 mg | ORAL_TABLET | Freq: Three times a day (TID) | ORAL | 1 refills | Status: DC | PRN
Start: 2023-08-25 — End: 2023-11-10

## 2023-08-25 MED ORDER — IBUPROFEN 600 MG PO TABS: ORAL_TABLET | ORAL | 0 refills | Status: DC

## 2023-08-25 NOTE — Assessment & Plan Note (Addendum)
Suspect viral but pleurisy due to physical exam Prefer nsaid over prednisone due to diabetes A1c 8.1  Ibuprofen 600 mg tid until 24 hours resolution of symptoms.  Advised to take ibuprofen with food and monitor for stomach upset.  Advised patient on supportive measures:  Be sure to rest, drink plenty of fluids, and use tylenol or ibuprofen as needed for pain. Follow up if fever >101, if symptoms worsen or if symptoms are not improved in 3 days. Patient verbalizes understanding.   Red flag symptoms discussed with patient.   Covid flu negative Rx tessalon perrles 200 mg tid

## 2023-08-25 NOTE — Progress Notes (Signed)
Established Patient Office Visit  Subjective:   Patient ID: Wanda [name], female    DOB: [birth year]  Age: 78 y.o. MRN: [id number]  CC:  Chief Complaint  Patient presents with   Cough    Reports cough with production of yellow/green mucus x3 days. Has not developed back pain from coughing.    HPI: Wanda [name] is a 78 y.o. female presenting on 08/25/2023 for Cough (Reports cough with production of yellow/green mucus x3 days. Has not developed back pain from coughing.)  Six days ago started with cough and sputum with chest congestion. She does feel some aching in her back that started yesterday, feels a 'catch in her upper back right around right shoulder area. Even with talking she is coughing.    No fever but she did have a slight one last week not in the last few days.  No wheezing. No sore throat. With ear fullness and nasal congestion.  Is taking mucinex DM without much relief.         ROS: Negative unless specifically indicated above in HPI.   Relevant past medical history reviewed and updated as indicated.   Allergies and medications reviewed and updated.   Current Outpatient Medications:    Accu-Chek Softclix Lancets lancets, 2 (two) times daily., Disp: , Rfl:    acetaminophen (TYLENOL) 325 MG tablet, Take 2 tablets by mouth as needed., Disp: , Rfl:    atorvastatin (LIPITOR) 20 MG tablet, Take 1 tablet (20 mg total) by mouth daily., Disp: 90 tablet, Rfl: 3   benzonatate (TESSALON) 200 MG capsule, Take 1 capsule (200 mg total) by mouth 2 (two) times daily as needed for cough., Disp: 20 capsule, Rfl: 0   Blood Glucose Monitoring Suppl (ACCU-CHEK GUIDE ME) w/Device KIT, USE AS DIRECTED TO CHECK BLOOD SUGAR TWICE DAILY, Disp: , Rfl:    cyclobenzaprine (FLEXERIL) 5 MG tablet, Take 1 tablet (5 mg total) by mouth 3 (three) times daily as needed for muscle spasms., Disp: 30 tablet, Rfl: 1   ibuprofen (ADVIL) 600 MG tablet, Take one tablet every 8 hours until resolution of  symptoms by at least 24 hours., Disp: 30 tablet, Rfl: 0   insulin degludec (TRESIBA FLEXTOUCH) 100 UNIT/ML FlexTouch Pen, Inject 10 Units into the skin daily., Disp: 15 mL, Rfl: 3   Insulin Pen Needle 32G X 4 MM MISC, 1 Device by Does not apply route daily in the afternoon., Disp: 100 each, Rfl: 3   metFORMIN (GLUCOPHAGE-XR) 500 MG 24 hr tablet, Take 2 tablets (1,000 mg total) by mouth daily with breakfast., Disp: 180 tablet, Rfl: 3   omeprazole (PRILOSEC) 20 MG capsule, Take 1 capsule (20 mg total) by mouth every other day., Disp: 45 capsule, Rfl: 1   repaglinide (PRANDIN) 2 MG tablet, TAKE 1 TABLET (2 MG TOTAL) BY MOUTH 3 (THREE) TIMES DAILY BEFORE MEALS., Disp: 270 tablet, Rfl: 1   Semaglutide,0.25 or 0.5MG /DOS, 2 MG/3ML SOPN, Inject 0.5 mg into the skin once a week., Disp: 9 mL, Rfl: 2   triamcinolone cream (KENALOG) 0.1 %, Apply 1 Application topically 2 (two) times daily., Disp: 30 g, Rfl: 0  Allergies  Allergen Reactions   Niacin     REACTION: unbearable hot flashes   Sulfamethoxazole-Trimethoprim Itching    Objective:   BP 118/76 (BP Location: Left Arm, Patient Position: Sitting, Cuff Size: Normal)   Pulse 78   Temp 98.6 F (37 C) (Temporal)   Ht 5' 3.5" (1.613 m)   Wt 138 lb 3.2 oz (  62.7 kg)   SpO2 98%   BMI 24.10 kg/m    Physical Exam Constitutional:      General: She is not in acute distress.    Appearance: Normal appearance. She is normal weight. She is not ill-appearing, toxic-appearing or diaphoretic.  HENT:     Head: Normocephalic.     Right Ear: Tympanic membrane normal.     Left Ear: Tympanic membrane normal.     Nose: Nose normal.     Mouth/Throat:     Mouth: Mucous membranes are dry.     Pharynx: No oropharyngeal exudate or posterior oropharyngeal erythema.  Eyes:     Extraocular Movements: Extraocular movements intact.     Pupils: Pupils are equal, round, and reactive to light.  Cardiovascular:     Rate and Rhythm: Normal rate and regular rhythm.      Pulses: Normal pulses.     Heart sounds: Normal heart sounds.  Pulmonary:     Effort: Pulmonary effort is normal.     Breath sounds: Decreased breath sounds (painful for pt to take a deep breath) present. No wheezing, rhonchi or rales.  Musculoskeletal:     Cervical back: Normal range of motion.  Neurological:     General: No focal deficit present.     Mental Status: She is alert and oriented to person, place, and time. Mental status is at baseline.  Psychiatric:        Mood and Affect: Mood normal.        Behavior: Behavior normal.        Thought Content: Thought content normal.        Judgment: Judgment normal.     Assessment & Plan:  Suspected COVID-19 virus infection -     POC COVID-19 BinaxNow  Cough, unspecified type -     POC COVID-19 BinaxNow -     POCT Influenza A/B  Fever, unspecified fever cause -     POCT Influenza A/B  SOB (shortness of breath)  Acute cough -     Benzonatate; Take 1 capsule (200 mg total) by mouth 2 (two) times daily as needed for cough.  Dispense: 20 capsule; Refill: 0 -     Ibuprofen; Take one tablet every 8 hours until resolution of symptoms by at least 24 hours.  Dispense: 30 tablet; Refill: 0  Viral upper respiratory infection Assessment & Plan: Covid and flu negative  Suspected viral infection  Tenderness right upper back suspected to be muscular from coughing (heat on site, tylenol prn)  Advised patient on supportive measures:  Be sure to rest, drink plenty of fluids, and use tylenol or ibuprofen as needed for pain. Follow up if fever >101, if symptoms worsen or if symptoms are not improved in 3 days. Patient verbalizes understanding. ;   Pleurisy Assessment & Plan: Suspect viral but pleurisy due to physical exam Prefer nsaid over prednisone due to diabetes A1c 8.1  Ibuprofen 600 mg tid until 24 hours resolution of symptoms.  Advised to take ibuprofen with food and monitor for stomach upset.  Advised patient on supportive  measures:  Be sure to rest, drink plenty of fluids, and use tylenol or ibuprofen as needed for pain. Follow up if fever >101, if symptoms worsen or if symptoms are not improved in 3 days. Patient verbalizes understanding.   Red flag symptoms discussed with patient.   Covid flu negative Rx tessalon perrles 200 mg tid     Muscle spasm -     Cyclobenzaprine HCl;  Take 1 tablet (5 mg total) by mouth 3 (three) times daily as needed for muscle spasms.  Dispense: 30 tablet; Refill: 1     Follow up plan: Return for f/u PCP if no improvement in symptoms.  Mort Sawyers, FNP

## 2023-08-25 NOTE — Assessment & Plan Note (Signed)
Covid and flu negative  Suspected viral infection  Tenderness right upper back suspected to be muscular from coughing (heat on site, tylenol prn)  Advised patient on supportive measures:  Be sure to rest, drink plenty of fluids, and use tylenol or ibuprofen as needed for pain. Follow up if fever >101, if symptoms worsen or if symptoms are not improved in 3 days. Patient verbalizes understanding. ;

## 2023-08-25 NOTE — Telephone Encounter (Signed)
Noted  

## 2023-08-27 ENCOUNTER — Encounter: Payer: Self-pay | Admitting: Family Medicine

## 2023-08-27 ENCOUNTER — Ambulatory Visit: Payer: Medicare PPO | Admitting: Family Medicine

## 2023-08-27 ENCOUNTER — Ambulatory Visit (INDEPENDENT_AMBULATORY_CARE_PROVIDER_SITE_OTHER)
Admission: RE | Admit: 2023-08-27 | Discharge: 2023-08-27 | Disposition: A | Discharge status: Home / Self Care | Payer: Medicare PPO | Source: Ambulatory Visit | Attending: Family Medicine | Admitting: Family Medicine

## 2023-08-27 VITALS — BP 132/72 | HR 73 | Temp 97.9°F | Ht 63.5 in | Wt 142.1 lb

## 2023-08-27 DIAGNOSIS — R051 Acute cough: Secondary | ICD-10-CM | POA: Diagnosis not present

## 2023-08-27 DIAGNOSIS — E1122 Type 2 diabetes mellitus with diabetic chronic kidney disease: Secondary | ICD-10-CM | POA: Diagnosis not present

## 2023-08-27 DIAGNOSIS — R7989 Other specified abnormal findings of blood chemistry: Secondary | ICD-10-CM | POA: Diagnosis not present

## 2023-08-27 DIAGNOSIS — E785 Hyperlipidemia, unspecified: Secondary | ICD-10-CM | POA: Diagnosis not present

## 2023-08-27 DIAGNOSIS — R0781 Pleurodynia: Secondary | ICD-10-CM

## 2023-08-27 DIAGNOSIS — R059 Cough, unspecified: Secondary | ICD-10-CM | POA: Diagnosis not present

## 2023-08-27 DIAGNOSIS — E1169 Type 2 diabetes mellitus with other specified complication: Secondary | ICD-10-CM

## 2023-08-27 DIAGNOSIS — N1832 Chronic kidney disease, stage 3b: Secondary | ICD-10-CM

## 2023-08-27 DIAGNOSIS — Z794 Long term (current) use of insulin: Secondary | ICD-10-CM | POA: Diagnosis not present

## 2023-08-27 DIAGNOSIS — R0989 Other specified symptoms and signs involving the circulatory and respiratory systems: Secondary | ICD-10-CM | POA: Diagnosis not present

## 2023-08-27 NOTE — Progress Notes (Signed)
Patient ID: Wanda [name], female    DOB: 02/07/1946, 77 y.o.   MRN: [id number]  This visit was conducted in person.  BP 132/72   Pulse 73   Temp 97.9 F (36.6 C) (Oral)   Ht 5' 3.5" (1.613 m)   Wt 142 lb 2 oz (64.5 kg)   SpO2 98%   BMI 24.78 kg/m    CC:  Chief Complaint  Patient presents with   Cough    Here for f/u. Seen 08/25/23, by Wyatt Mage, for same sxs. Also, wants to discuss atorvastatin and semaglutide.    Subjective:   HPI: Wanda [name] is a 78 y.o. female presenting on 08/27/2023 for Cough (Here for f/u. Seen 08/25/23, by Wyatt Mage, for same sxs. Also, wants to discuss atorvastatin and semaglutide.)  Recent OV on 1/21 for cough.. suspected COVID, although testing negative on all day 6 of illness. patient with symptoms of pleurisy, suspected... had pain in right low back radiating to right leg... describes pain as constant, worse with moving, deep breath Recommended NSAID instead of prednisone given diabetes with A1c 8.1.  Ibuprofen started 600 mg 3 times daily.  Today she reports continued cough but less, also improvement in pleuritic pain, less back pain. 50% better overall.  Mucus is dirty yellow.  No fever. No ear pain, no ST. No wheeze. Now on day 7 of illness  Mild SOB., no wheeze. Wt Readings from Last 3 Encounters:  08/27/23 142 lb 2 oz (64.5 kg)  08/25/23 138 lb 3.2 oz (62.7 kg)  08/14/23 138 lb (62.6 kg)     DM... she has questions about her meds Lab Results  Component Value Date   HGBA1C 8.0 (H) 08/27/2023    Dexcom  179 fasting, 248  post prandial.   No history of asthma, COPD.   Has held atorvastatin for now for 3 week given concern about liver issues.  Was on pravastain before.   HAs upcoming OV with GI reguarding liver tests.      Relevant past medical, surgical, family and social history reviewed and updated as indicated. Interim medical history since our last visit reviewed. Allergies and medications reviewed and updated. Outpatient  Medications Prior to Visit  Medication Sig Dispense Refill   Accu-Chek Softclix Lancets lancets 2 (two) times daily.     acetaminophen (TYLENOL) 325 MG tablet Take 2 tablets by mouth as needed.     benzonatate (TESSALON) 200 MG capsule Take 1 capsule (200 mg total) by mouth 2 (two) times daily as needed for cough. 20 capsule 0   Blood Glucose Monitoring Suppl (ACCU-CHEK GUIDE ME) w/Device KIT USE AS DIRECTED TO CHECK BLOOD SUGAR TWICE DAILY     cyclobenzaprine (FLEXERIL) 5 MG tablet Take 1 tablet (5 mg total) by mouth 3 (three) times daily as needed for muscle spasms. 30 tablet 1   ibuprofen (ADVIL) 600 MG tablet Take one tablet every 8 hours until resolution of symptoms by at least 24 hours. 30 tablet 0   insulin degludec (TRESIBA FLEXTOUCH) 100 UNIT/ML FlexTouch Pen Inject 10 Units into the skin daily. 15 mL 3   Insulin Pen Needle 32G X 4 MM MISC 1 Device by Does not apply route daily in the afternoon. 100 each 3   metFORMIN (GLUCOPHAGE-XR) 500 MG 24 hr tablet Take 2 tablets (1,000 mg total) by mouth daily with breakfast. 180 tablet 3   omeprazole (PRILOSEC) 20 MG capsule Take 1 capsule (20 mg total) by mouth every other day. 45 capsule 1  repaglinide (PRANDIN) 2 MG tablet TAKE 1 TABLET (2 MG TOTAL) BY MOUTH 3 (THREE) TIMES DAILY BEFORE MEALS. 270 tablet 1   Semaglutide,0.25 or 0.5MG /DOS, 2 MG/3ML SOPN Inject 0.5 mg into the skin once a week. 9 mL 2   triamcinolone cream (KENALOG) 0.1 % Apply 1 Application topically 2 (two) times daily. 30 g 0   atorvastatin (LIPITOR) 20 MG tablet Take 1 tablet (20 mg total) by mouth daily. (Patient not taking: Reported on 08/27/2023) 90 tablet 3   busPIRone (BUSPAR) 15 MG tablet Take 0.5 tablets (7.5 mg total) by mouth 2 (two) times daily.     No facility-administered medications prior to visit.     Per HPI unless specifically indicated in ROS section below Review of Systems  Constitutional:  Negative for fatigue and fever.  HENT:  Positive for congestion.    Eyes:  Negative for pain.  Respiratory:  Positive for cough. Negative for shortness of breath.   Cardiovascular:  Negative for chest pain, palpitations and leg swelling.  Gastrointestinal:  Negative for abdominal pain.  Genitourinary:  Negative for dysuria and vaginal bleeding.  Musculoskeletal:  Positive for back pain.  Neurological:  Negative for syncope, light-headedness and headaches.  Psychiatric/Behavioral:  Negative for dysphoric mood.    Objective:  BP 132/72   Pulse 73   Temp 97.9 F (36.6 C) (Oral)   Ht 5' 3.5" (1.613 m)   Wt 142 lb 2 oz (64.5 kg)   SpO2 98%   BMI 24.78 kg/m   Wt Readings from Last 3 Encounters:  08/27/23 142 lb 2 oz (64.5 kg)  08/25/23 138 lb 3.2 oz (62.7 kg)  08/14/23 138 lb (62.6 kg)      Physical Exam Constitutional:      General: She is not in acute distress.    Appearance: Normal appearance. She is well-developed. She is not ill-appearing or toxic-appearing.  HENT:     Head: Normocephalic.     Right Ear: Hearing, tympanic membrane, ear canal and external ear normal. Tympanic membrane is not erythematous, retracted or bulging.     Left Ear: Hearing, tympanic membrane, ear canal and external ear normal. Tympanic membrane is not erythematous, retracted or bulging.     Nose: No mucosal edema or rhinorrhea.     Right Sinus: No maxillary sinus tenderness or frontal sinus tenderness.     Left Sinus: No maxillary sinus tenderness or frontal sinus tenderness.     Mouth/Throat:     Pharynx: Uvula midline.  Eyes:     General: Lids are normal. Lids are everted, no foreign bodies appreciated.     Conjunctiva/sclera: Conjunctivae normal.     Pupils: Pupils are equal, round, and reactive to light.  Neck:     Thyroid: No thyroid mass or thyromegaly.     Vascular: No carotid bruit.     Trachea: Trachea normal.  Cardiovascular:     Rate and Rhythm: Normal rate and regular rhythm.     Pulses: Normal pulses.     Heart sounds: Normal heart sounds, S1  normal and S2 normal. No murmur heard.    No friction rub. No gallop.  Pulmonary:     Effort: Pulmonary effort is normal. No tachypnea or respiratory distress.     Breath sounds: Normal breath sounds. No decreased breath sounds, wheezing, rhonchi or rales.  Abdominal:     General: Bowel sounds are normal.     Palpations: Abdomen is soft.     Tenderness: There is no abdominal tenderness.  Musculoskeletal:     Cervical back: Normal range of motion and neck supple.     Thoracic back: Tenderness present.  Skin:    General: Skin is warm and dry.     Findings: No rash.  Neurological:     Mental Status: She is alert.  Psychiatric:        Mood and Affect: Mood is not anxious or depressed.        Speech: Speech normal.        Behavior: Behavior normal. Behavior is cooperative.        Thought Content: Thought content normal.        Judgment: Judgment normal.       Results for orders placed or performed in visit on 08/27/23  Comprehensive metabolic panel   Collection Time: 08/27/23  4:23 PM  Result Value Ref Range   Sodium 138 135 - 145 mEq/L   Potassium 4.8 3.5 - 5.1 mEq/L   Chloride 105 96 - 112 mEq/L   CO2 26 19 - 32 mEq/L   Glucose, Bld 274 (H) 70 - 99 mg/dL   BUN 20 6 - 23 mg/dL   Creatinine, Ser 9.14 0.40 - 1.20 mg/dL   Total Bilirubin 0.3 0.2 - 1.2 mg/dL   Alkaline Phosphatase 120 (H) 39 - 117 U/L   AST 16 0 - 37 U/L   ALT 16 0 - 35 U/L   Total Protein 7.4 6.0 - 8.3 g/dL   Albumin 3.9 3.5 - 5.2 g/dL   GFR 78.29 (L) >56.21 mL/min   Calcium 9.2 8.4 - 10.5 mg/dL  Hemoglobin H0Q   Collection Time: 08/27/23  4:23 PM  Result Value Ref Range   Hgb A1c MFr Bld 8.0 (H) 4.6 - 6.5 %  Lipid panel   Collection Time: 08/27/23  4:23 PM  Result Value Ref Range   Cholesterol 162 0 - 200 mg/dL   Triglycerides 657.8 (H) 0.0 - 149.0 mg/dL   HDL 46.96 (L) >29.52 mg/dL   VLDL 84.1 (H) 0.0 - 32.4 mg/dL   LDL Cholesterol 69 0 - 99 mg/dL   Total CHOL/HDL Ratio 6    NonHDL 132.40   CBC  with Differential/Platelet   Collection Time: 08/27/23  4:23 PM  Result Value Ref Range   WBC 8.0 4.0 - 10.5 K/uL   RBC 4.01 3.87 - 5.11 Mil/uL   Hemoglobin 12.5 12.0 - 15.0 g/dL   HCT 40.1 02.7 - 25.3 %   MCV 94.9 78.0 - 100.0 fl   MCHC 32.8 30.0 - 36.0 g/dL   RDW 66.4 40.3 - 47.4 %   Platelets 298.0 150.0 - 400.0 K/uL   Neutrophils Relative % 61.4 43.0 - 77.0 %   Lymphocytes Relative 24.1 12.0 - 46.0 %   Monocytes Relative 9.4 3.0 - 12.0 %   Eosinophils Relative 4.3 0.0 - 5.0 %   Basophils Relative 0.8 0.0 - 3.0 %   Neutro Abs 4.9 1.4 - 7.7 K/uL   Lymphs Abs 1.9 0.7 - 4.0 K/uL   Monocytes Absolute 0.8 0.1 - 1.0 K/uL   Eosinophils Absolute 0.3 0.0 - 0.7 K/uL   Basophils Absolute 0.1 0.0 - 0.1 K/uL    Assessment and Plan  Acute cough Assessment & Plan: Acute, likely viral upper respiratory tract infection but given pleuritic component of chest wall back pain will evaluate with chest x-ray.  Orders: -     CBC with Differential/Platelet -     DG Chest 2 View; Future  Type 2  diabetes mellitus with stage 3b chronic kidney disease, with long-term current use of insulin (HCC) Assessment & Plan: Chronic, uncontrolled, followed by endocrinology.... has appt next week. Due for reevaluation of labs, will draw while she is in the office today and this can be reviewed by her endocrinologist.   On metfomrin  Trieseba 10 units daily Ozempic 0.5 weekly..  has not started higher dose yet... encourgaed her to do so.  Repaglinide  2 mg  BID.Marland Kitchen supposed to be taking every meal.. she plans to increase.  Orders: -     Comprehensive metabolic panel -     Hemoglobin A1c -     Lipid panel  Pleuritic chest pain Assessment & Plan: Acute, has improved significantly with ibuprofen.  Pain is more back related than chest wall at this point.  There is still is a large pleuritic component though.   Hyperlipidemia associated with type 2 diabetes mellitus (HCC) Assessment & Plan: Stable,  chronic.  Due for reevaluation on atorvastatin.     Elevated liver function tests Assessment & Plan: Acute, negative hepatitis testing, improvement over time, negative ultrasound and CT.  No family history of liver issue. Potentially due to atorvastatin.  Patient has held this and we will recheck liver function test today.     No follow-ups on file.   Kerby Nora, MD

## 2023-08-27 NOTE — Assessment & Plan Note (Addendum)
Chronic, uncontrolled, followed by endocrinology.... has appt next week. Due for reevaluation of labs, will draw while she is in the office today and this can be reviewed by her endocrinologist.   On metfomrin  Trieseba 10 units daily Ozempic 0.5 weekly..  has not started higher dose yet... encourgaed her to do so.  Repaglinide  2 mg  BID.Marland Kitchen supposed to be taking every meal.. she plans to increase.

## 2023-08-28 ENCOUNTER — Encounter: Payer: Self-pay | Admitting: Family Medicine

## 2023-08-28 DIAGNOSIS — R7989 Other specified abnormal findings of blood chemistry: Secondary | ICD-10-CM | POA: Insufficient documentation

## 2023-08-28 LAB — CBC WITH DIFFERENTIAL/PLATELET
Basophils Absolute: 0.1 10*3/uL (ref 0.0–0.1)
Basophils Relative: 0.8 % (ref 0.0–3.0)
Eosinophils Absolute: 0.3 10*3/uL (ref 0.0–0.7)
Eosinophils Relative: 4.3 % (ref 0.0–5.0)
HCT: 38.1 % (ref 36.0–46.0)
Hemoglobin: 12.5 g/dL (ref 12.0–15.0)
Lymphocytes Relative: 24.1 % (ref 12.0–46.0)
Lymphs Abs: 1.9 10*3/uL (ref 0.7–4.0)
MCHC: 32.8 g/dL (ref 30.0–36.0)
MCV: 94.9 fL (ref 78.0–100.0)
Monocytes Absolute: 0.8 10*3/uL (ref 0.1–1.0)
Monocytes Relative: 9.4 % (ref 3.0–12.0)
Neutro Abs: 4.9 10*3/uL (ref 1.4–7.7)
Neutrophils Relative %: 61.4 % (ref 43.0–77.0)
Platelets: 298 10*3/uL (ref 150.0–400.0)
RBC: 4.01 Mil/uL (ref 3.87–5.11)
RDW: 13.5 % (ref 11.5–15.5)
WBC: 8 10*3/uL (ref 4.0–10.5)

## 2023-08-28 LAB — COMPREHENSIVE METABOLIC PANEL
ALT: 16 U/L (ref 0–35)
AST: 16 U/L (ref 0–37)
Albumin: 3.9 g/dL (ref 3.5–5.2)
Alkaline Phosphatase: 120 U/L — ABNORMAL HIGH (ref 39–117)
BUN: 20 mg/dL (ref 6–23)
CO2: 26 meq/L (ref 19–32)
Calcium: 9.2 mg/dL (ref 8.4–10.5)
Chloride: 105 meq/L (ref 96–112)
Creatinine, Ser: 1.01 mg/dL (ref 0.40–1.20)
GFR: 54.34 mL/min — ABNORMAL LOW (ref >60.00)
Glucose, Bld: 274 mg/dL — ABNORMAL HIGH (ref 70–99)
Potassium: 4.8 meq/L (ref 3.5–5.1)
Sodium: 138 meq/L (ref 135–145)
Total Bilirubin: 0.3 mg/dL (ref 0.2–1.2)
Total Protein: 7.4 g/dL (ref 6.0–8.3)

## 2023-08-28 LAB — LIPID PANEL
Cholesterol: 162 mg/dL (ref 0–200)
HDL: 29.3 mg/dL — ABNORMAL LOW (ref >39.00)
LDL Cholesterol: 69 mg/dL (ref 0–99)
NonHDL: 132.4
Total CHOL/HDL Ratio: 6
Triglycerides: 315 mg/dL — ABNORMAL HIGH (ref 0.0–149.0)
VLDL: 63 mg/dL — ABNORMAL HIGH (ref 0.0–40.0)

## 2023-08-28 LAB — HEMOGLOBIN A1C: Hgb A1c MFr Bld: 8 % — ABNORMAL HIGH (ref 4.6–6.5)

## 2023-08-28 NOTE — Assessment & Plan Note (Signed)
Acute, likely viral upper respiratory tract infection but given pleuritic component of chest wall back pain will evaluate with chest x-ray.

## 2023-08-28 NOTE — Assessment & Plan Note (Signed)
Acute, has improved significantly with ibuprofen.  Pain is more back related than chest wall at this point.  There is still is a large pleuritic component though.

## 2023-08-28 NOTE — Assessment & Plan Note (Signed)
Stable, chronic.  Due for reevaluation on atorvastatin.

## 2023-08-28 NOTE — Assessment & Plan Note (Signed)
Acute, negative hepatitis testing, improvement over time, negative ultrasound and CT.  No family history of liver issue. Potentially due to atorvastatin.  Patient has held this and we will recheck liver function test today.

## 2023-09-01 ENCOUNTER — Encounter: Payer: Self-pay | Admitting: Internal Medicine

## 2023-09-01 ENCOUNTER — Ambulatory Visit: Payer: Medicare PPO | Admitting: Internal Medicine

## 2023-09-01 VITALS — BP 118/70 | HR 90 | Ht 63.5 in | Wt 141.8 lb

## 2023-09-01 DIAGNOSIS — N1832 Chronic kidney disease, stage 3b: Secondary | ICD-10-CM

## 2023-09-01 DIAGNOSIS — E782 Mixed hyperlipidemia: Secondary | ICD-10-CM | POA: Diagnosis not present

## 2023-09-01 DIAGNOSIS — E1165 Type 2 diabetes mellitus with hyperglycemia: Secondary | ICD-10-CM

## 2023-09-01 DIAGNOSIS — E1122 Type 2 diabetes mellitus with diabetic chronic kidney disease: Secondary | ICD-10-CM | POA: Diagnosis not present

## 2023-09-01 DIAGNOSIS — E1142 Type 2 diabetes mellitus with diabetic polyneuropathy: Secondary | ICD-10-CM | POA: Diagnosis not present

## 2023-09-01 DIAGNOSIS — Z794 Long term (current) use of insulin: Secondary | ICD-10-CM | POA: Diagnosis not present

## 2023-09-01 MED ORDER — TRESIBA FLEXTOUCH 100 UNIT/ML ~~LOC~~ SOPN: 6.0000 [IU] | PEN_INJECTOR | Freq: Every day | SUBCUTANEOUS | 3 refills | Status: AC

## 2023-09-01 MED ORDER — GLIPIZIDE 5 MG PO TABS: 5.0000 mg | ORAL_TABLET | Freq: Two times a day (BID) | ORAL | 3 refills | Status: DC

## 2023-09-01 MED ORDER — SEMAGLUTIDE(0.25 OR 0.5MG/DOS) 2 MG/3ML ~~LOC~~ SOPN: 0.5000 mg | PEN_INJECTOR | SUBCUTANEOUS | 2 refills | Status: DC

## 2023-09-01 MED ORDER — METFORMIN HCL ER 500 MG PO TB24: 1000.0000 mg | ORAL_TABLET | Freq: Every day | ORAL | 3 refills | Status: AC

## 2023-09-01 MED ORDER — INSULIN PEN NEEDLE 32G X 4 MM MISC
1.0000 | Freq: Every day | 3 refills | Status: AC
Start: 2023-09-01 — End: ?

## 2023-09-01 MED ORDER — PRAVASTATIN SODIUM 20 MG PO TABS: 20.0000 mg | ORAL_TABLET | Freq: Every day | ORAL | 3 refills | Status: DC

## 2023-09-01 NOTE — Progress Notes (Signed)
Name: Wanda [name]  MRN/ DOB: [id number], 16-Jan-1946   Age/ Sex: 78 y.o., female    PCP: Excell Seltzer, MD   Reason for Endocrinology Evaluation: Type 2 Diabetes Mellitus     Date of Initial Endocrinology Visit: 09/30/2019    PATIENT IDENTIFIER: Wanda [name] is a 78 y.o. female with a past medical history of T2DM, Dyslipidemia and HTN. The patient presented for initial endocrinology clinic visit on 09/30/2019 for consultative assistance with her diabetes management.    HPI: [name] was    Diagnosed with DM > 30 yrs Prior Medications tried/Intolerance: repaglinide was started 05/2022, insulin was started 03/2022. Trulicity caused severe weight loss . Jardiance - yeast infection  .  Intolerant to higher doses of metformin Hemoglobin A1c has ranged from 6.9% in 2014, peaking at 9.6% in 2023.   Pt follows with urology for urinary frequency   Has burning sensation of the feet   Denies pancreatitis but has chronic abdominal  pain    On her initial visit to our clinic she had an A1c of 8.5%, she was on metformin, repaglinide, and basal insulin which we adjusted  Started Ozempic 03/2023   She met with our CDE 08/14/2023  SUBJECTIVE:   During the last visit (04/01/2023): A1c 8.1 %     Today (09/01/23): Wanda [name] is here for a follow up on diabetes management. She checks her blood sugars multiple  times daily. The patient has  had hypoglycemic episodes since the last clinic visit, which typically occur at night   I had switched pravastatin to atorvastatin in 03/2023 due to elevated LDL and TG 305 mg/dL .  By September her alkaline phosphatase was elevated at 307 U/L , atorvastatin has been held and alkaline phosphatase is trending down, Tg trended down 179 mg/dL   She has ongoing cough , on medications  Denies fever  She lost weight on Ozempic but she ran out of it and restarted 2 weeks. She was off of if for 3 weeks Denies nausea or vomiting  Has constipation while sick  but this has resolved  and no diarrhea   HOME DIABETES REGIMEN: Metformin 500 mg XR 2 tabs daily  Repaglinide 2 mg, 1 tab TID Ozempic 0.5 mg weekly Tresiba 10 units daily  Atorvastatin 20 mg daily - on hold     Statin: yes ACE-I/ARB: no   CONTINUOUS GLUCOSE MONITORING RECORD INTERPRETATION    Dates of Recording: 1/15-1/28/2025  Sensor description:dexcom  Results statistics:   CGM use % of time 92  Average and SD 211/63  Time in range 32 %  % Time Above 180 39  % Time above 250 29  % Time Below target 0   Glycemic patterns summary: BG's trend down overnight and fluctuate during the day  Hyperglycemic episodes  post prandial  Hypoglycemic episodes occurred  n/a  Overnight periods: variable      DIABETIC COMPLICATIONS: Microvascular complications:  Neuropathy  Denies: retinopathy, CKD  Last eye exam: Completed 04/2022  Macrovascular complications:   Denies: CAD, PVD, CVA   PAST HISTORY: Past Medical History:  Past Medical History:  Diagnosis Date   Anxiety    Colon polyps    Depression    DM (diabetes mellitus) (HCC)    Female bladder prolapse    Fibromyalgia    "neurofibromyalgia"   Headache    HTN (hypertension)    Hyperlipidemia    Past Surgical History:  Past Surgical History:  Procedure Laterality Date   COLONOSCOPY  03/28/2019   Dr Loreta Ave   WRIST SURGERY  1969    Social History:  reports that she has never smoked. She has never used smokeless tobacco. She reports that she does not drink alcohol and does not use drugs. Family History:  Family History  Problem Relation Age of Onset   Diabetes Mother    Hypertension Mother    Hyperlipidemia Mother    Stroke Mother    Heart disease Mother    Heart disease Father    Diabetes Sister    Diabetes Sister    Breast cancer Sister 44 - 70   Heart disease Maternal Grandmother    Diabetes Maternal Grandfather    Parkinson's disease Brother    Heart attack Other    Colon cancer Neg Hx     Esophageal cancer Neg Hx      HOME MEDICATIONS: Allergies as of 09/01/2023       Reactions   Niacin    REACTION: unbearable hot flashes   Sulfamethoxazole-trimethoprim Itching        Medication List        Accurate as of September 01, 2023 12:30 PM. If you have any questions, ask your nurse or doctor.          Accu-Chek Guide Me w/Device Kit USE AS DIRECTED TO CHECK BLOOD SUGAR TWICE DAILY   Accu-Chek Softclix Lancets lancets 2 (two) times daily.   acetaminophen 325 MG tablet Commonly known as: TYLENOL Take 2 tablets by mouth as needed.   atorvastatin 20 MG tablet Commonly known as: LIPITOR Take 1 tablet (20 mg total) by mouth daily.   benzonatate 200 MG capsule Commonly known as: TESSALON Take 1 capsule (200 mg total) by mouth 2 (two) times daily as needed for cough.   busPIRone 15 MG tablet Commonly known as: BUSPAR Take 0.5 tablets (7.5 mg total) by mouth 2 (two) times daily.   cyclobenzaprine 5 MG tablet Commonly known as: FLEXERIL Take 1 tablet (5 mg total) by mouth 3 (three) times daily as needed for muscle spasms.   ibuprofen 600 MG tablet Commonly known as: ADVIL Take one tablet every 8 hours until resolution of symptoms by at least 24 hours.   Insulin Pen Needle 32G X 4 MM Misc 1 Device by Does not apply route daily in the afternoon.   metFORMIN 500 MG 24 hr tablet Commonly known as: GLUCOPHAGE-XR Take 2 tablets (1,000 mg total) by mouth daily with breakfast.   omeprazole 20 MG capsule Commonly known as: PRILOSEC Take 1 capsule (20 mg total) by mouth every other day.   repaglinide 2 MG tablet Commonly known as: PRANDIN TAKE 1 TABLET (2 MG TOTAL) BY MOUTH 3 (THREE) TIMES DAILY BEFORE MEALS.   Semaglutide(0.25 or 0.5MG /DOS) 2 MG/3ML Sopn Inject 0.5 mg into the skin once a week.   Evaristo Bury FlexTouch 100 UNIT/ML FlexTouch Pen Generic drug: insulin degludec Inject 10 Units into the skin daily.   triamcinolone cream 0.1 % Commonly known as:  KENALOG Apply 1 Application topically 2 (two) times daily.         ALLERGIES: Allergies  Allergen Reactions   Niacin     REACTION: unbearable hot flashes   Sulfamethoxazole-Trimethoprim Itching     REVIEW OF SYSTEMS: A comprehensive ROS was conducted with the patient and is negative except as per HPI   OBJECTIVE:   VITAL SIGNS: BP 118/70 (BP Location: Left Arm, Patient Position: Sitting, Cuff Size: Small)   Pulse 90   Ht 5' 3.5" (1.613  m)   Wt 141 lb 12.8 oz (64.3 kg)   SpO2 99%   BMI 24.72 kg/m    PHYSICAL EXAM:  General: Pt appears well and is in NAD  Neck: Thyroid: Thyroid size normal.    Lungs: Clear with good BS bilat   Heart: RRR   Extremities:  Lower extremities - No pretibial edema  Neuro: MS is good with appropriate affect, pt is alert and Ox3    DM foot exam: 04/01/2023  The skin of the feet is intact without sores or ulcerations. The pedal pulses are 2+ on right and 2+ on left. The sensation is intact to a screening 5.07, 10 gram monofilament bilaterally   DATA REVIEWED:  Lab Results  Component Value Date   HGBA1C 8.0 (H) 08/27/2023   HGBA1C 8.3 (H) 04/20/2023   HGBA1C 8.1 (A) 04/01/2023   Lab Results  Component Value Date   MICROALBUR 1.5 04/01/2023   LDLCALC 69 08/27/2023   CREATININE 1.01 08/27/2023   Lab Results  Component Value Date   MICRALBCREAT 1.7 04/01/2023    Lab Results  Component Value Date   CHOL 162 08/27/2023   HDL 29.30 (L) 08/27/2023   LDLCALC 69 08/27/2023   LDLDIRECT 90.0 04/01/2023   TRIG 315.0 (H) 08/27/2023   CHOLHDL 6 08/27/2023       Old records , labs and images have been reviewed.    ASSESSMENT / PLAN / RECOMMENDATIONS:   1) Type 2 Diabetes Mellitus, Poorly controlled, With Neuropathic and CKD III complications - Most recent A1c of 8.0 %. Goal A1c < 7.0 %.    -A1c continues to be above goal -Patient admits to imperfect adherence to medication, for example last night she forgot to take her  repaglinide with supper.  She was out of Ozempic for approximately 3 weeks in December, despite having refills - Intolerant to higher metformin -She developed yeast infection to [place], and is currently under the care of urology for urinary frequency and prolapse -She believes she developed abdominal pain with Trulicity -Patient is having hardship maintaining repaglinide 3 times a day, I have recommended switching this to glipizide, discussed cross reaction with Bactrim, discussed importance of taking this 15 minutes before breakfast and supper and not to take it with lunch. -Patient opted to remain on the same dose of Ozempic at this time -I will decrease Evaristo Bury, as she has been noted with hypoglycemia overnight   MEDICATIONS:  Continue Ozempic  0.5 mg weekly Continue Metformin 500 mg, 2 tabs daily Decrease Tresiba 6 units daily Stop repaglinide Start glipizide 5 mg twice daily   EDUCATION / INSTRUCTIONS: BG monitoring instructions: Patient is instructed to check her blood sugars 3 times a day, before meals. Call [facility] if: BG persistently < 70  I reviewed the Rule of 15 for the treatment of hypoglycemia in detail with the patient. Literature supplied.   2) Diabetic complications:  Eye: Does not have known diabetic retinopathy.  Neuro/ Feet: Does  have known diabetic peripheral neuropathy. Renal: Patient does not have known baseline CKD. She is not on an ACEI/ARB at present.  3) Dyslipidemia :   -I had switched pravastatin 20 mg to atorvastatin 20 mg 03/2023 with a triglyceride level of 305 mg/dL, and an LDL of 90 Mg/DL but unfortunately her alkaline phosphatase has increased and atorvastatin was put on hold -She is currently having her LFTs monitored through PCPs office which has trended down -Once alkaline phosphatase normalizes, patient may return to taking pravastatin  20 mg daily    Medication Restart pravastatin 20 mg Stop atorvastatin 20 mg  daily     Follow-up in 4 months    Signed electronically by: Lyndle Herrlich, MD  [facility] End[facility]  [facility] [address] [place] [place], [place] [address] Phone: [phone] FAX: [phone]   CC: Excell Seltzer, MD [address] [place] [address] Phone: [phone]  Fax: [phone]    Return to Endocrinology clinic as below: Future Appointments  Date Time Provider Department Center  09/02/2023  9:00 AM Meredith Pel, NP [facility]  10/09/2023 11:15 AM Bonnita Levan, RD [facility]  01/04/2024  2:20 PM [facility] ANNUAL WELLNESS VISIT 1 [facility] P[facility]

## 2023-09-01 NOTE — Patient Instructions (Addendum)
STOP Repaglinide Start Glipizide 5 mg , 1 tablet before Breakfast and 1 tablet before Supper Continue Ozempic 0.5 mg once weekly Continue Metformin 500 mg XR 2 tablets  daily  Decrease Tresiba 6 units daily     HOW TO TREAT LOW BLOOD SUGARS (Blood sugar LESS THAN 70 MG/DL) Please follow the RULE OF 15 for the treatment of hypoglycemia treatment (when your (blood sugars are less than 70 mg/dL)   STEP 1: Take 15 grams of carbohydrates when your blood sugar is low, which includes:  3-4 GLUCOSE TABS  OR 3-4 OZ OF JUICE OR REGULAR SODA OR ONE TUBE OF GLUCOSE GEL    STEP 2: RECHECK blood sugar in 15 MINUTES STEP 3: If your blood sugar is still low at the 15 minute recheck --> then, go back to STEP 1 and treat AGAIN with another 15 grams of carbohydrates.

## 2023-09-02 ENCOUNTER — Encounter: Payer: Self-pay | Admitting: Internal Medicine

## 2023-09-02 ENCOUNTER — Ambulatory Visit: Payer: Medicare PPO | Admitting: Nurse Practitioner

## 2023-09-02 ENCOUNTER — Encounter: Payer: Self-pay | Admitting: Nurse Practitioner

## 2023-09-02 VITALS — BP 130/80 | HR 95 | Ht 64.0 in | Wt 140.1 lb

## 2023-09-02 DIAGNOSIS — K219 Gastro-esophageal reflux disease without esophagitis: Secondary | ICD-10-CM | POA: Diagnosis not present

## 2023-09-02 DIAGNOSIS — R7989 Other specified abnormal findings of blood chemistry: Secondary | ICD-10-CM

## 2023-09-02 DIAGNOSIS — R1031 Right lower quadrant pain: Secondary | ICD-10-CM

## 2023-09-02 DIAGNOSIS — G8929 Other chronic pain: Secondary | ICD-10-CM

## 2023-09-02 DIAGNOSIS — K5900 Constipation, unspecified: Secondary | ICD-10-CM

## 2023-09-02 NOTE — Progress Notes (Signed)
ASSESSMENT    Brief Narrative:  78 y.o.  female known to Dr. Lavon Paganini  with a past medical history not limited to GERD, adenomatous colon polyps, diabetes, CKD, hypertension, anxiety, fibromyalgia.  Patient was last seen in the office June 2023 for evaluation of multiple GI symptoms.  PCP has referred her for abdominal pain and elevated alk phos ( now resolving). Referral was made in October . Patient unsure why she is here today.   Elevated liver chemistries ( Alk phos, GGT, ALT). Resolving and was possibly  secondary to atorvastatin.  Alk phos now nearly back to normal ( 307 > 120) and ALT has normalized off Atorvastatin. RUQ [place] and CT AP with contrast without any hepatobiliary findings.   Intermittent RLQ pain. Suspect due in part to constipation, also musculoskeletal component.  No acute findings on CT AP with contrast in October   Intermittent constipation.  Sometimes she can go days , even weeks without a bowel movement .   GERD Takes Prilosec as needed (about once a week).    See [facility] for any additional medical & surgical history   PLAN   --Discussed anti-reflux measures such as avoidance of late meals / bedtime snacks, HOB elevation (or use of wedge pillow), weight reduction ( if applicable)  / maintaining a healthy BMI ( body mass index),  and avoidance of trigger foods and caffeine.  -- Continue Prilosec as needed though with antireflux measures she may eventually not require any medication for reflux. Contact our office if having increased GERD symptoms.  -- Increase water intake to 60 ounces daily -- We discussed adding a daily fiber supplement.  She prefers Metamucil fiber thins.  Also high-fiber diet provided -- If still struggling with constipation despite above measures then she can take daily MiraLAX as needed. -- Patient has now started pravastatin (which she apparently took in the past without any effect on liver chemistries). If however liver tests become abnormal  again then she will need to follow up so we can weight benefits / risks of continuing treatment and also evaluate for any competing causes of elevated liver tests.      HPI   Chief complaint : Patient isn't clear about why she is here today    Brief GI History:  Patient was last seen in the office 01/23/2022 by Mike Gip, PA  for evaluation of episodic diarrhea with associated abdominal discomfort, decreased appetite, early satiety, and weight loss.  Please refer to that dictation for details.  In summary she was scheduled for CT scan, given dietary advice for diarrhea and advised to try Imodium.  There was also discussion about possibly needing to hold Trulicity in the future to see if GI symptoms improve.  CT scan 01/30/2022 showed moderate to large stool burden.  No acute findings.   Epic records reviewed.  Patient was started on Atorvastatin several months ago and alk phos rose. Saw PCP late September for physical exam.  She had a new elevation in alk phos at 307 and mild elevation of ALT. GGT was elevated as well. She was sent for an RUQ ultrasound on 05/18/2023 which was unremarkable.  Then on 05/26/2023 a CT scan of the abdomen and pelvis with contrast showed no acute abnormalities. Alk phos has slowly trended down.   Interval history :  Alkaline phosphatase down to 120 on most recent labs 08/27/2023.  ALT now normal.  Total bilirubin has remained normal. Yesterday she was started on Pravastatin. She had  previously taken this before without any liver test abnormalities   Kaisey has intermittent chronic constipation. Typically has 2-3 BMs / week but sometimes has gone  2-3 weeks.without a BM. Now takes 1-2 Dulcolax tablets if goes more than a week without a BM but Dulcolax can cause explosive BMs. . She doubts that her fiber intake is sufficient.  She doubts that her water intake is insufficient.  She has not seen any blood in her stool  Carley has intermittent RLQ discomfort. The pain is  often relieved with a bowel movement.  The pain is not worse with movement/physical activity though she has had recent problems with lower back pain/spasm which radiates around to her RLQ.  Flexeril helps  Masiya has a history of GERD.  She takes Prilosec as needed which is about once a week.  She is not really following any specific antireflux precautions   GI History / Pertinent GI Studies   **All endoscopic studies may not be included here    Last colonoscopy was done August 2020 per Dr. Charna Elizabeth.  She had 2 small polyps removed, the largest was 7 mm.   Per Dr. Kenna Gilbert notes no repeat planned due to age.  Patient did a Cologuard May 2023 which was negative.   Component     Latest Ref Rng 04/20/2023 04/23/2023 05/20/2023 08/27/2023  CO2     19 - 32 mEq/L 26  28   26    Total Bilirubin     0.2 - 1.2 mg/dL 0.9  0.8  0.4  0.3   Alkaline Phosphatase     39 - 117 U/L 307 (H)  299 (H)  237 (H)  120 (H)   Alkaline Phosphatase        237 (H)    AST     0 - 37 U/L 22  37  70 (H)  16   ALT     0 - 35 U/L 42 (H)  38 (H)  92 (H)  16   Total Protein     6.0 - 8.3 g/dL 7.2  7.6  7.7  7.4   Albumin     3.5 - 5.2 g/dL 3.9  3.9  4.2  3.9         Latest Ref Rng & Units 08/27/2023    4:23 PM 05/22/2023   10:07 AM 01/02/2022   10:04 AM  CBC  WBC 4.0 - 10.5 K/uL 8.0  6.5  6.8   Hemoglobin 12.0 - 15.0 g/dL 65.7  84.6  96.2   Hematocrit 36.0 - 46.0 % 38.1  40.4  40.4   Platelets 150.0 - 400.0 K/uL 298.0  281.0  274.0      Past Medical History:  Diagnosis Date   Anxiety    Colon polyps    Depression    DM (diabetes mellitus) (HCC)    Female bladder prolapse    Fibromyalgia    "neurofibromyalgia"   Headache    HTN (hypertension)    Hyperlipidemia     Past Surgical History:  Procedure Laterality Date   COLONOSCOPY  03/28/2019   Dr Loreta Ave   WRIST SURGERY  1969    Family History  Problem Relation Age of Onset   Diabetes Mother    Hypertension Mother    Hyperlipidemia Mother     Stroke Mother    Heart disease Mother    Heart disease Father    Diabetes Sister    Diabetes Sister    Breast cancer Sister 7 -  59   Heart disease Maternal Grandmother    Diabetes Maternal Grandfather    Parkinson's disease Brother    Heart attack Other    Colon cancer Neg Hx    Esophageal cancer Neg Hx     Current Medications, Allergies, Family History and Social History were reviewed in [employer] record.     Current Outpatient Medications  Medication Sig Dispense Refill   Accu-Chek Softclix Lancets lancets 2 (two) times daily.     acetaminophen (TYLENOL) 325 MG tablet Take 2 tablets by mouth as needed.     benzonatate (TESSALON) 200 MG capsule Take 1 capsule (200 mg total) by mouth 2 (two) times daily as needed for cough. (Patient not taking: Reported on 09/01/2023) 20 capsule 0   Blood Glucose Monitoring Suppl (ACCU-CHEK GUIDE ME) w/Device KIT USE AS DIRECTED TO CHECK BLOOD SUGAR TWICE DAILY     busPIRone (BUSPAR) 15 MG tablet Take 0.5 tablets (7.5 mg total) by mouth 2 (two) times daily.     cyclobenzaprine (FLEXERIL) 5 MG tablet Take 1 tablet (5 mg total) by mouth 3 (three) times daily as needed for muscle spasms. 30 tablet 1   glipiZIDE (GLUCOTROL) 5 MG tablet Take 1 tablet (5 mg total) by mouth 2 (two) times daily before a meal. 180 tablet 3   ibuprofen (ADVIL) 600 MG tablet Take one tablet every 8 hours until resolution of symptoms by at least 24 hours. 30 tablet 0   insulin degludec (TRESIBA FLEXTOUCH) 100 UNIT/ML FlexTouch Pen Inject 6 Units into the skin daily. 15 mL 3   Insulin Pen Needle 32G X 4 MM MISC 1 Device by Does not apply route daily in the afternoon. 100 each 3   metFORMIN (GLUCOPHAGE-XR) 500 MG 24 hr tablet Take 2 tablets (1,000 mg total) by mouth daily with breakfast. 180 tablet 3   omeprazole (PRILOSEC) 20 MG capsule Take 1 capsule (20 mg total) by mouth every other day. 45 capsule 1   pravastatin (PRAVACHOL) 20 MG tablet Take 1  tablet (20 mg total) by mouth daily. 90 tablet 3   Semaglutide,0.25 or 0.5MG /DOS, 2 MG/3ML SOPN Inject 0.5 mg into the skin once a week. 9 mL 2   triamcinolone cream (KENALOG) 0.1 % Apply 1 Application topically 2 (two) times daily. 30 g 0   No current facility-administered medications for this visit.    Review of Systems: No chest pain. No shortness of breath. No urinary complaints.    Physical Exam  There were no vitals filed for this visit. Wt Readings from Last 3 Encounters:  09/01/23 141 lb 12.8 oz (64.3 kg)  08/27/23 142 lb 2 oz (64.5 kg)  08/25/23 138 lb 3.2 oz (62.7 kg)    BP 130/80 (BP Location: Left Arm, Patient Position: Sitting, Cuff Size: Normal)   Pulse 95   Ht 5\' 4"  (1.626 m)   Wt 140 lb 2 oz (63.6 kg)   BMI 24.05 kg/m  Constitutional:  Pleasant, generally well appearing female in no acute distress. Psychiatric: Normal mood and affect. Behavior is normal. EENT: Pupils normal.  Conjunctivae are normal. No scleral icterus. Neck supple.  Cardiovascular: Normal rate, regular rhythm.  Pulmonary/chest: Effort normal and breath sounds normal. No wheezing, rales or rhonchi. Abdominal: Soft, nondistended, nontender. Bowel sounds active throughout. There are no masses palpable. No hepatomegaly. Neurological: Alert and oriented to person place and time.   I spent 30 minutes total reviewing records, obtaining history, performing exam, counseling patient and documenting visit /  findings.   Willette Cluster, NP  09/02/2023, 8:47 AM  Cc:  Excell Seltzer, MD

## 2023-09-02 NOTE — Patient Instructions (Addendum)
Acid Reflux  Below are some measures you can take to hopefully improve acid reflux symptoms . We may have discussed some of these today in the office. Not everything on this list may apply to you   --If you are taking anti-reflux ( GERD) medication be sure to take it 30 minutes to one hour before breakfast or dinner if you take it in the afternoon.  --Avoid late meals / bedtime snacks.   --Avoid trigger foods ( foods which you know tend to aggravate you reflux symptoms). Some common trigger foods include spicy foods, fatty foods, acidic foods, chocolate and caffeine.  --Elevate the head of bed 6-8 inches on blocks or bricks. If not able to elevate the head of the bed consider purchasing a wedge pillow to sleep on.    --Weight reduction / maintain a healthy BMI ( body mass index) may be help with reflux symptoms  --Sometimes with the above mentioned "lifestyle changes" patients are able to reduce the amount of GERD medications they take. Our goal is to have you on the lowest effective dose of medication  Constipation: --Trial of daily Fiber supplement. We discussed Metamucil fiber thins. Also pick foods to eat from high fiber diet provided to you today.  --Increase water intake to 60 oz daily  --If you go more than 3 days without a BM take Miralax 1 capful mixed in 8 ounces of water daily until having BMs again.  --If constipation doesn't improve with above then let us know

## 2023-09-05 DIAGNOSIS — E1165 Type 2 diabetes mellitus with hyperglycemia: Secondary | ICD-10-CM | POA: Diagnosis not present

## 2023-09-05 DIAGNOSIS — Z794 Long term (current) use of insulin: Secondary | ICD-10-CM | POA: Diagnosis not present

## 2023-09-24 ENCOUNTER — Other Ambulatory Visit: Payer: Self-pay | Admitting: Family Medicine

## 2023-10-05 DIAGNOSIS — E1165 Type 2 diabetes mellitus with hyperglycemia: Secondary | ICD-10-CM | POA: Diagnosis not present

## 2023-10-05 DIAGNOSIS — Z794 Long term (current) use of insulin: Secondary | ICD-10-CM | POA: Diagnosis not present

## 2023-10-09 ENCOUNTER — Ambulatory Visit: Payer: Medicare PPO | Admitting: Dietician

## 2023-10-14 DIAGNOSIS — E1165 Type 2 diabetes mellitus with hyperglycemia: Secondary | ICD-10-CM | POA: Diagnosis not present

## 2023-10-14 DIAGNOSIS — E782 Mixed hyperlipidemia: Secondary | ICD-10-CM | POA: Diagnosis not present

## 2023-10-14 DIAGNOSIS — E162 Hypoglycemia, unspecified: Secondary | ICD-10-CM | POA: Diagnosis not present

## 2023-10-14 DIAGNOSIS — E1142 Type 2 diabetes mellitus with diabetic polyneuropathy: Secondary | ICD-10-CM | POA: Diagnosis not present

## 2023-10-27 ENCOUNTER — Ambulatory Visit: Admitting: Podiatry

## 2023-11-04 DIAGNOSIS — E1165 Type 2 diabetes mellitus with hyperglycemia: Secondary | ICD-10-CM | POA: Diagnosis not present

## 2023-11-04 DIAGNOSIS — Z794 Long term (current) use of insulin: Secondary | ICD-10-CM | POA: Diagnosis not present

## 2023-11-10 ENCOUNTER — Encounter: Payer: Self-pay | Admitting: Family Medicine

## 2023-11-10 ENCOUNTER — Ambulatory Visit: Admitting: Family Medicine

## 2023-11-10 VITALS — BP 141/72 | HR 81 | Temp 97.3°F | Ht 63.5 in | Wt 135.2 lb

## 2023-11-10 DIAGNOSIS — K529 Noninfective gastroenteritis and colitis, unspecified: Secondary | ICD-10-CM

## 2023-11-10 DIAGNOSIS — R5383 Other fatigue: Secondary | ICD-10-CM | POA: Diagnosis not present

## 2023-11-10 DIAGNOSIS — E538 Deficiency of other specified B group vitamins: Secondary | ICD-10-CM

## 2023-11-10 NOTE — Assessment & Plan Note (Signed)
 Chronic, resolved with supplementation on recent labs.. give monthly B12

## 2023-11-10 NOTE — Assessment & Plan Note (Signed)
 Chronic, ongoing 6 to 9 months.  Symptoms are described as primarily diarrhea but then alternating with no bowel movement and regular bowel movements, associated with abdominal bloating and cramping. In workup for elevated liver function test she did have a normal CT scan of abdomen and normal ultrasound.  Elevated liver function tests returned to normal after stopping atorvastatin.  She has been on metformin for many years but this could potentially be contributing to GI symptoms.  I have asked her to discuss with her endocrinologist if she can try a trial off of this medication. Her description of her symptoms today sounds possibly due to irritable bowel syndrome diarrhea predominant.  We discussed continue to work on increasing fiber and water in diet.  She can retry a probiotic.  She can look into avoidance of FODMAP foods. Last colonoscopy 2020 showed polyps but otherwise unremarkable. Encouraged her to consider following up with GI if her symptoms or not improving as expected.

## 2023-11-10 NOTE — Progress Notes (Signed)
 Patient ID: Wanda [name], female    DOB: 1946/07/07, 78 y.o.   MRN: [id number]  This visit was conducted in person.  BP (!) 141/72 (BP Location: Left Arm, Patient Position: Sitting, Cuff Size: Normal)   Pulse 81   Temp (!) 97.3 F (36.3 C) (Temporal)   Ht 5' 3.5" (1.613 m)   Wt 135 lb 4 oz (61.3 kg)   SpO2 98%   BMI 23.58 kg/m    CC:  Chief Complaint  Patient presents with   Fatigue    Needs B12 injections   Muscle Pull    Feels muscle pull from buttocks and down back on legs   Abdominal Pain    Always feels bad after she eats   Diarrhea    Subjective:   HPI: Wanda [name] is a 78 y.o. female presenting on 11/10/2023 for Fatigue (Needs B12 injections), Muscle Pull (Feels muscle pull from buttocks and down back on legs), Abdominal Pain (Always feels bad after she eats), and Diarrhea   Now off atorvastatin  ( stopped by ENDO) given LFTs  elevated.. nml at last check... ALk phos down to 120.  1 year ago low B12 154... on recheck 04/2023 263 Taking B12 1000 mg daily to several days a week. She reports she fatigued even with B12   Nml CBC in 1.2025    NmlCT abd: 05/26/2023  [place] abd:  In last 6-9 months... She noted BM sometime frequent stool 5-6 a day with cramping,  then no BM for 2-3 days, then nml BMs. Feeling bloated with eating. No emesis.  Having spells of thi every 2-3 weeks.  Colonoscopy 2020 polyps.  ? If metformin causing SE? Has been on for 35 years... anytime dose increase.. has diarrhea.  Relevant past medical, surgical, family and social history reviewed and updated as indicated. Interim medical history since our last visit reviewed. Allergies and medications reviewed and updated. Outpatient Medications Prior to Visit  Medication Sig Dispense Refill   Accu-Chek Softclix Lancets lancets 2 (two) times daily.     acetaminophen (TYLENOL) 325 MG tablet Take 2 tablets by mouth as needed.     Blood Glucose Monitoring Suppl (ACCU-CHEK GUIDE ME) w/Device KIT USE AS  DIRECTED TO CHECK BLOOD SUGAR TWICE DAILY     busPIRone (BUSPAR) 15 MG tablet Take 0.5 tablets (7.5 mg total) by mouth 2 (two) times daily.     ezetimibe (ZETIA) 10 MG tablet Take 1 tablet by mouth daily.     Insulin Aspart FlexPen (NOVOLOG) 100 UNIT/ML Inject 4 Units into the skin daily before breakfast.     insulin degludec (TRESIBA FLEXTOUCH) 100 UNIT/ML FlexTouch Pen Inject 6 Units into the skin daily. 15 mL 3   Insulin Pen Needle 32G X 4 MM MISC 1 Device by Does not apply route daily in the afternoon. 100 each 3   metFORMIN (GLUCOPHAGE-XR) 500 MG 24 hr tablet Take 2 tablets (1,000 mg total) by mouth daily with breakfast. 180 tablet 3   omeprazole (PRILOSEC) 20 MG capsule TAKE 1 CAPSULE (20 MG TOTAL) BY MOUTH EVERY OTHER DAY. 45 capsule 1   Semaglutide,0.25 or 0.5MG /DOS, 2 MG/3ML SOPN Inject 0.5 mg into the skin once a week. 9 mL 2   benzonatate (TESSALON) 200 MG capsule Take 1 capsule (200 mg total) by mouth 2 (two) times daily as needed for cough. 20 capsule 0   cyclobenzaprine (FLEXERIL) 5 MG tablet Take 1 tablet (5 mg total) by mouth 3 (three) times daily as needed  for muscle spasms. (Patient not taking: Reported on 09/02/2023) 30 tablet 1   glipiZIDE (GLUCOTROL) 5 MG tablet Take 1 tablet (5 mg total) by mouth 2 (two) times daily before a meal. 180 tablet 3   ibuprofen (ADVIL) 600 MG tablet Take one tablet every 8 hours until resolution of symptoms by at least 24 hours. 30 tablet 0   pravastatin (PRAVACHOL) 20 MG tablet Take 1 tablet (20 mg total) by mouth daily. 90 tablet 3   triamcinolone cream (KENALOG) 0.1 % Apply 1 Application topically 2 (two) times daily. 30 g 0   No facility-administered medications prior to visit.     Per HPI unless specifically indicated in ROS section below Review of Systems  Constitutional:  Negative for fatigue and fever.  HENT:  Negative for congestion.   Eyes:  Negative for pain.  Respiratory:  Negative for cough and shortness of breath.    Cardiovascular:  Negative for chest pain, palpitations and leg swelling.  Gastrointestinal:  Positive for abdominal distention, diarrhea and nausea. Negative for abdominal pain and blood in stool.  Genitourinary:  Negative for dysuria and vaginal bleeding.  Musculoskeletal:  Negative for back pain.       Complains of tight muscles that pull when she leans forward in her buttocks.  Information on low back and buttock stretching exercises.  Neurological:  Negative for syncope, light-headedness and headaches.  Psychiatric/Behavioral:  Negative for dysphoric mood.    Objective:  BP (!) 141/72 (BP Location: Left Arm, Patient Position: Sitting, Cuff Size: Normal)   Pulse 81   Temp (!) 97.3 F (36.3 C) (Temporal)   Ht 5' 3.5" (1.613 m)   Wt 135 lb 4 oz (61.3 kg)   SpO2 98%   BMI 23.58 kg/m   Wt Readings from Last 3 Encounters:  11/10/23 135 lb 4 oz (61.3 kg)  09/02/23 140 lb 2 oz (63.6 kg)  09/01/23 141 lb 12.8 oz (64.3 kg)      Physical Exam Constitutional:      General: She is not in acute distress.    Appearance: Normal appearance. She is well-developed. She is not ill-appearing or toxic-appearing.  HENT:     Head: Normocephalic.     Right Ear: Hearing, tympanic membrane, ear canal and external ear normal. Tympanic membrane is not erythematous, retracted or bulging.     Left Ear: Hearing, tympanic membrane, ear canal and external ear normal. Tympanic membrane is not erythematous, retracted or bulging.     Nose: No mucosal edema or rhinorrhea.     Right Sinus: No maxillary sinus tenderness or frontal sinus tenderness.     Left Sinus: No maxillary sinus tenderness or frontal sinus tenderness.     Mouth/Throat:     Pharynx: Uvula midline.  Eyes:     General: Lids are normal. Lids are everted, no foreign bodies appreciated.     Conjunctiva/sclera: Conjunctivae normal.     Pupils: Pupils are equal, round, and reactive to light.  Neck:     Thyroid: No thyroid mass or thyromegaly.      Vascular: No carotid bruit.     Trachea: Trachea normal.  Cardiovascular:     Rate and Rhythm: Normal rate and regular rhythm.     Pulses: Normal pulses.     Heart sounds: Normal heart sounds, S1 normal and S2 normal. No murmur heard.    No friction rub. No gallop.  Pulmonary:     Effort: Pulmonary effort is normal. No tachypnea or respiratory distress.  Breath sounds: Normal breath sounds. No decreased breath sounds, wheezing, rhonchi or rales.  Abdominal:     General: Bowel sounds are normal.     Palpations: Abdomen is soft.     Tenderness: There is no abdominal tenderness.  Musculoskeletal:     Cervical back: Normal range of motion and neck supple.  Skin:    General: Skin is warm and dry.     Findings: No rash.  Neurological:     Mental Status: She is alert.  Psychiatric:        Mood and Affect: Mood is not anxious or depressed.        Speech: Speech normal.        Behavior: Behavior normal. Behavior is cooperative.        Thought Content: Thought content normal.        Judgment: Judgment normal.       Results for orders placed or performed in visit on 08/27/23  Comprehensive metabolic panel   Collection Time: 08/27/23  4:23 PM  Result Value Ref Range   Sodium 138 135 - 145 mEq/L   Potassium 4.8 3.5 - 5.1 mEq/L   Chloride 105 96 - 112 mEq/L   CO2 26 19 - 32 mEq/L   Glucose, Bld 274 (H) 70 - 99 mg/dL   BUN 20 6 - 23 mg/dL   Creatinine, Ser 8.65 0.40 - 1.20 mg/dL   Total Bilirubin 0.3 0.2 - 1.2 mg/dL   Alkaline Phosphatase 120 (H) 39 - 117 U/L   AST 16 0 - 37 U/L   ALT 16 0 - 35 U/L   Total Protein 7.4 6.0 - 8.3 g/dL   Albumin 3.9 3.5 - 5.2 g/dL   GFR 78.46 (L) >96.29 mL/min   Calcium 9.2 8.4 - 10.5 mg/dL  Hemoglobin B2W   Collection Time: 08/27/23  4:23 PM  Result Value Ref Range   Hgb A1c MFr Bld 8.0 (H) 4.6 - 6.5 %  Lipid panel   Collection Time: 08/27/23  4:23 PM  Result Value Ref Range   Cholesterol 162 0 - 200 mg/dL   Triglycerides 413.2 (H) 0.0 -  149.0 mg/dL   HDL 44.01 (L) >02.72 mg/dL   VLDL 53.6 (H) 0.0 - 64.4 mg/dL   LDL Cholesterol 69 0 - 99 mg/dL   Total CHOL/HDL Ratio 6    NonHDL 132.40   CBC with Differential/Platelet   Collection Time: 08/27/23  4:23 PM  Result Value Ref Range   WBC 8.0 4.0 - 10.5 K/uL   RBC 4.01 3.87 - 5.11 Mil/uL   Hemoglobin 12.5 12.0 - 15.0 g/dL   HCT 03.4 74.2 - 59.5 %   MCV 94.9 78.0 - 100.0 fl   MCHC 32.8 30.0 - 36.0 g/dL   RDW 63.8 75.6 - 43.3 %   Platelets 298.0 150.0 - 400.0 K/uL   Neutrophils Relative % 61.4 43.0 - 77.0 %   Lymphocytes Relative 24.1 12.0 - 46.0 %   Monocytes Relative 9.4 3.0 - 12.0 %   Eosinophils Relative 4.3 0.0 - 5.0 %   Basophils Relative 0.8 0.0 - 3.0 %   Neutro Abs 4.9 1.4 - 7.7 K/uL   Lymphs Abs 1.9 0.7 - 4.0 K/uL   Monocytes Absolute 0.8 0.1 - 1.0 K/uL   Eosinophils Absolute 0.3 0.0 - 0.7 K/uL   Basophils Absolute 0.1 0.0 - 0.1 K/uL    Assessment and Plan  B12 deficiency Assessment & Plan: Chronic, resolved with supplementation on recent labs.. give monthly  B12    Other fatigue Assessment & Plan: Will evaluate with lab workup.  Orders: -     TSH; Future -     Vitamin B12; Future -     VITAMIN D 25 Hydroxy (Vit-D Deficiency, Fractures); Future  Chronic diarrhea Assessment & Plan: Chronic, ongoing 6 to 9 months.  Symptoms are described as primarily diarrhea but then alternating with no bowel movement and regular bowel movements, associated with abdominal bloating and cramping. In workup for elevated liver function test she did have a normal CT scan of abdomen and normal ultrasound.  Elevated liver function tests returned to normal after stopping atorvastatin.  She has been on metformin for many years but this could potentially be contributing to GI symptoms.  I have asked her to discuss with her endocrinologist if she can try a trial off of this medication. Her description of her symptoms today sounds possibly due to irritable bowel syndrome  diarrhea predominant.  We discussed continue to work on increasing fiber and water in diet.  She can retry a probiotic.  She can look into avoidance of FODMAP foods. Last colonoscopy 2020 showed polyps but otherwise unremarkable. Encouraged her to consider following up with GI if her symptoms or not improving as expected.     Return for lab only.   Kerby Nora, MD

## 2023-11-10 NOTE — Assessment & Plan Note (Signed)
 Will evaluate with lab workup.

## 2023-11-10 NOTE — Patient Instructions (Addendum)
 Follow up with GI for further evaluation of possible IBS.  Increase  fiber in diet, water and retry probiotic.  Can try  FODMAP diet.  Discuss with ENDO.,. trying a trial off metformin and incread increasing [place] or semaglutide.

## 2023-11-11 ENCOUNTER — Other Ambulatory Visit: Payer: Self-pay | Admitting: Internal Medicine

## 2023-11-11 ENCOUNTER — Other Ambulatory Visit (INDEPENDENT_AMBULATORY_CARE_PROVIDER_SITE_OTHER)

## 2023-11-11 DIAGNOSIS — R5383 Other fatigue: Secondary | ICD-10-CM

## 2023-11-12 ENCOUNTER — Other Ambulatory Visit: Payer: Self-pay | Admitting: Family Medicine

## 2023-11-12 ENCOUNTER — Encounter: Payer: Self-pay | Admitting: Family Medicine

## 2023-11-12 LAB — VITAMIN D 25 HYDROXY (VIT D DEFICIENCY, FRACTURES): VITD: 18.08 ng/mL — ABNORMAL LOW (ref 30.00–100.00)

## 2023-11-12 LAB — TSH: TSH: 3.81 u[IU]/mL (ref 0.35–5.50)

## 2023-11-12 LAB — VITAMIN B12: Vitamin B-12: 301 pg/mL (ref 211–911)

## 2023-11-12 MED ORDER — VITAMIN D3 1.25 MG (50000 UT) PO CAPS: 1.0000 | ORAL_CAPSULE | ORAL | 0 refills | Status: DC

## 2023-12-02 ENCOUNTER — Ambulatory Visit: Payer: Medicare PPO | Admitting: Internal Medicine

## 2023-12-04 DIAGNOSIS — Z794 Long term (current) use of insulin: Secondary | ICD-10-CM | POA: Diagnosis not present

## 2023-12-04 DIAGNOSIS — E1165 Type 2 diabetes mellitus with hyperglycemia: Secondary | ICD-10-CM | POA: Diagnosis not present

## 2024-01-04 ENCOUNTER — Ambulatory Visit (INDEPENDENT_AMBULATORY_CARE_PROVIDER_SITE_OTHER): Payer: Medicare PPO

## 2024-01-04 VITALS — Ht 63.5 in | Wt 125.0 lb

## 2024-01-04 DIAGNOSIS — Z0000 Encounter for general adult medical examination without abnormal findings: Secondary | ICD-10-CM | POA: Diagnosis not present

## 2024-01-04 NOTE — Progress Notes (Signed)
 Subjective:   Wanda [name] is a 78 y.o. who presents for a Medicare Wellness preventive visit.  As a reminder, Annual Wellness Visits don't include a physical exam, and some assessments may be limited, especially if this visit is performed virtually. We may recommend an in-person follow-up visit with your provider if needed.  Visit Complete: Virtual I connected with  Dannie Duval on 01/04/24 by a audio enabled telemedicine application and verified that I am speaking with the correct person using two identifiers.  Patient Location: Home  Provider Location: Home Office  I discussed the limitations of evaluation and management by telemedicine. The patient expressed understanding and agreed to proceed.  Vital Signs: Because this visit was a virtual/telehealth visit, some criteria may be missing or patient reported. Any vitals not documented were not able to be obtained and vitals that have been documented are patient reported.  VideoDeclined- This patient declined Librarian, academic. Therefore the visit was completed with audio only.  Persons Participating in Visit: Patient.  AWV Questionnaire: No: Patient Medicare AWV questionnaire was not completed prior to this visit.  Cardiac Risk Factors include: advanced age (>86men, >43 women);diabetes mellitus;hypertension     Objective:     Today's Vitals   01/04/24 1452  Weight: 125 lb (56.7 kg)  Height: 5' 3.5" (1.613 m)   Body mass index is 21.8 kg/m.     01/04/2024    2:57 PM 08/14/2023   10:55 AM 12/31/2022    2:39 PM 12/17/2021    2:57 PM 02/19/2021    2:03 PM 02/20/2016   11:09 AM  Advanced Directives  Does Patient Have a Medical Advance Directive? No Yes Yes Yes Yes Yes  Type of Best boy of [place];Living will Healthcare Power of [place];Living will Healthcare Power of [place];Living will Living will;Healthcare Power of Attorney  Does patient want to make changes to  medical advance directive?    No - Patient declined No - Patient declined Yes - information given  Copy of Healthcare Power of Attorney in Chart?   No - copy requested No - copy requested    Would patient like information on creating a medical advance directive? Yes (MAU/Ambulatory/Procedural Areas - Information given)         Current Medications (verified) Outpatient Encounter Medications as of 01/04/2024  Medication Sig   Accu-Chek Softclix Lancets lancets 2 (two) times daily.   acetaminophen  (TYLENOL ) 325 MG tablet Take 2 tablets by mouth as needed.   Blood Glucose Monitoring Suppl (ACCU-CHEK GUIDE ME) w/Device KIT USE AS DIRECTED TO CHECK BLOOD SUGAR TWICE DAILY   busPIRone  (BUSPAR ) 15 MG tablet Take 0.5 tablets (7.5 mg total) by mouth 2 (two) times daily.   Cholecalciferol (VITAMIN D3) 1.25 MG (50000 UT) CAPS Take 1 capsule (1.25 mg total) by mouth once a week.   ezetimibe (ZETIA) 10 MG tablet Take 1 tablet by mouth daily.   Insulin  Aspart FlexPen (NOVOLOG) 100 UNIT/ML Inject 4 Units into the skin daily before breakfast.   insulin  degludec (TRESIBA  FLEXTOUCH) 100 UNIT/ML FlexTouch Pen Inject 6 Units into the skin daily.   Insulin  Pen Needle 32G X 4 MM MISC 1 Device by Does not apply route daily in the afternoon.   metFORMIN  (GLUCOPHAGE -XR) 500 MG 24 hr tablet Take 2 tablets (1,000 mg total) by mouth daily with breakfast.   omeprazole  (PRILOSEC) 20 MG capsule TAKE 1 CAPSULE (20 MG TOTAL) BY MOUTH EVERY OTHER DAY.   Semaglutide ,0.25 or 0.5MG /DOS, 2 MG/3ML  SOPN Inject 0.5 mg into the skin once a week.   No facility-administered encounter medications on file as of 01/04/2024.    Allergies (verified) Atorvastatin , Empagliflozin , Niacin, and Sulfamethoxazole-trimethoprim   History: Past Medical History:  Diagnosis Date   Anxiety    CKD (chronic kidney disease)    Colon polyps    Depression    DM (diabetes mellitus) (HCC)    Female bladder prolapse    Fibromyalgia     "neurofibromyalgia"   Headache    HTN (hypertension)    Hyperlipidemia    Past Surgical History:  Procedure Laterality Date   COLONOSCOPY  03/28/2019   Dr Tova Fresh   WRIST SURGERY  1969   Family History  Problem Relation Age of Onset   Diabetes Mother    Hypertension Mother    Hyperlipidemia Mother    Stroke Mother    Heart disease Mother    Heart disease Father    Diabetes Sister    Diabetes Sister    Breast cancer Sister 23 - 38   Heart disease Maternal Grandmother    Diabetes Maternal Grandfather    Parkinson's disease Brother    Heart attack Other    Colon cancer Neg Hx    Esophageal cancer Neg Hx    Social History   Socioeconomic History   Marital status: Widowed    Spouse name: Not on file   Number of children: 2   Years of education: Master's degree   Highest education level: Master's degree (e.g., MA, MS, MEng, MEd, MSW, MBA)  Occupational History    Comment: retired Runner, broadcasting/film/video  Tobacco Use   Smoking status: Never   Smokeless tobacco: Never  Vaping Use   Vaping status: Never Used  Substance and Sexual Activity   Alcohol use: No    Alcohol/week: 0.0 standard drinks of alcohol   Drug use: No   Sexual activity: Not Currently  Other Topics Concern   Not on file  Social History Narrative   Lives with husband    caffeine - none   01/16/20   From: [place]    Living: alone   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) - has gone back some and may return parttime      Family: Public house manager (mosiacism down's syndrome, lives in garage apartment) and Mara [place]       Enjoys: pickle-ball prior to covid, traveling with sisters, house projects      Exercise: not currently - wants to get back to walking   Diet: trying to reduce portions, limit soda      Safety   Seat belts: Yes    Guns: No   Safe in relationships: Yes    Social Drivers of Corporate investment banker Strain: Low Risk  (01/04/2024)   Overall Financial Resource Strain (CARDIA)     Difficulty of Paying Living Expenses: Not hard at all  Food Insecurity: No Food Insecurity (01/04/2024)   Hunger Vital Sign    Worried About Running Out of Food in the Last Year: Never true    Ran Out of Food in the Last Year: Never true  Transportation Needs: No Transportation Needs (01/04/2024)   PRAPARE - Administrator, Civil Service (Medical): No    Lack of Transportation (Non-Medical): No  Physical Activity: Inactive (01/04/2024)   Exercise Vital Sign    Days of Exercise per Week: 0 days    Minutes of Exercise per Session: 0 min  Stress: Stress Concern Present (01/04/2024)  [employer] of Occupational Health - Occupational Stress Questionnaire    Feeling of Stress : To some extent  Social Connections: Moderately Integrated (01/04/2024)   Social Connection and Isolation Panel [NHANES]    Frequency of Communication with Friends and Family: More than three times a week    Frequency of Social Gatherings with Friends and Family: More than three times a week    Attends Religious Services: More than 4 times per year    Active Member of [employer] or Organizations: Yes    Attends Banker Meetings: 1 to 4 times per year    Marital Status: Widowed    Tobacco Counseling Counseling given: Not Answered    Clinical Intake:  Pre-visit preparation completed: Yes  Pain : No/denies pain     Diabetes: Yes CBG done?: No Did pt. bring in CBG monitor from home?: No  Lab Results  Component Value Date   HGBA1C 8.0 (H) 08/27/2023   HGBA1C 8.3 (H) 04/20/2023   HGBA1C 8.1 (A) 04/01/2023     How often do you need to have someone help you when you read instructions, pamphlets, or other written materials from your doctor or pharmacy?: 1 - Never  Interpreter Needed?: No  Information entered by :: Seabron Cypress LPN   Activities of Daily Living     01/04/2024    2:56 PM 03/18/2023   11:34 AM  In your present state of health, do you have any difficulty performing the  following activities:  Hearing? 0 0  Vision? 0 0  Difficulty concentrating or making decisions? 0 0  Walking or climbing stairs? 0 0  Dressing or bathing? 0 0  Doing errands, shopping? 0 0  Preparing Food and eating ? N   Using the Toilet? N   In the past six months, have you accidently leaked urine? N   Do you have problems with loss of bowel control? N   Managing your Medications? N   Managing your Finances? N   Housekeeping or managing your Housekeeping? N     Patient Care Team: Judithann Novas, MD as PCP - General (Family Medicine) Alvina Axon, MD as Consulting Physician (Ophthalmology) [facility], Julian Obey, MD as Consulting Physician (Endocrinology) Arlee Bellows, NP as Nurse Practitioner (Gastroenterology) Cindra Cree, MD as Consulting Physician (Ophthalmology) Tere Felts Arlyn Bergeron, MD as Referring Physician (Endocrinology) Graciella Lavender, [place] as Physician Assistant (Gastroenterology)  I have updated your Care Teams any recent Medical Services you may have received from other providers in the past year.     Assessment:    This is a routine wellness examination for Wanda [name].  Hearing/Vision screen Hearing Screening - Comments:: Denies hearing difficulties   Vision Screening - Comments:: Wears rx glasses - up to date with routine eye exams with Dr. Ambrosio Junker    Goals Addressed             This Visit's Progress    Patient Stated   On track    Lose 10 pounds       Depression Screen     01/04/2024    2:54 PM 11/10/2023    3:57 PM 08/27/2023    3:33 PM 08/14/2023   10:55 AM 04/27/2023   10:42 AM 03/18/2023   11:34 AM 01/12/2023    3:53 PM  PHQ 2/9 Scores  PHQ - 2 Score 0 0 0 0 0 0 0  PHQ- 9 Score   3  1 2  0    Fall Risk  01/04/2024    2:56 PM 11/10/2023    3:57 PM 08/27/2023    3:33 PM 08/14/2023   10:55 AM 04/27/2023   10:42 AM  Fall Risk   Falls in the past year? 0 0 0 0 0  Number falls in past yr: 0 0   0  Injury with Fall? 0 0   0  Risk  for fall due to : No Fall Risks No Fall Risks   No Fall Risks  Follow up Falls prevention discussed;Education provided;Falls evaluation completed Falls evaluation completed   Falls evaluation completed    MEDICARE RISK AT HOME:  Medicare Risk at Home Any stairs in or around the home?: Yes If so, are there any without handrails?: No Home free of loose throw rugs in walkways, pet beds, electrical cords, etc?: Yes Adequate lighting in your home to reduce risk of falls?: Yes Life alert?: No Use of a cane, walker or w/c?: No Grab bars in the bathroom?: Yes Shower chair or bench in shower?: No Elevated toilet seat or a handicapped toilet?: Yes  TIMED UP AND GO:  Was the test performed?  No  Cognitive Function: 6CIT completed    02/20/2016   11:14 AM  MMSE - Mini Mental State Exam  Orientation to time 5  Orientation to Place 5  Registration 3  Attention/ Calculation 0  Recall 3  Language- name 2 objects 0  Language- repeat 1  Language- follow 3 step command 3  Language- read & follow direction 0  Write a sentence 0  Copy design 0  Total score 20        01/04/2024    2:57 PM 12/31/2022    2:45 PM 12/17/2021    2:57 PM  6CIT Screen  What Year? 0 points 0 points 0 points  What month? 0 points 0 points 0 points  What time? 0 points 0 points 0 points  Count back from 20 0 points 0 points 0 points  Months in reverse 0 points 0 points 0 points  Repeat phrase 0 points 0 points 0 points  Total Score 0 points 0 points 0 points    Immunizations Immunization History  Administered Date(s) Administered   Influenza Split 06/03/2012   Influenza,inj,Quad PF,6+ Mos 08/10/2013, 05/03/2014, 05/06/2017, 07/15/2018, 05/18/2019   PFIZER(Purple Top)SARS-COV-2 Vaccination 09/18/2019, 10/11/2019   PPD Test 04/19/2013, 04/14/2022   Pneumococcal Conjugate-13 10/02/2014   Pneumococcal Polysaccharide-23 04/25/2013   Td 08/19/2007    Screening Tests Health Maintenance  Topic Date Due    Zoster Vaccines- Shingrix (1 of 2) Never done   DTaP/Tdap/Td (2 - Tdap) 08/18/2017   DEXA SCAN  12/26/2022   COVID-19 Vaccine (3 - 2024-25 season) 04/05/2023   HEMOGLOBIN A1C  02/24/2024   INFLUENZA VACCINE  03/04/2024   Diabetic kidney evaluation - Urine ACR  03/31/2024   FOOT EXAM  03/31/2024   OPHTHALMOLOGY EXAM  04/09/2024   MAMMOGRAM  06/28/2024   Diabetic kidney evaluation - eGFR measurement  08/26/2024   Medicare Annual Wellness (AWV)  01/03/2025   Pneumonia Vaccine 31+ Years old  Completed   Hepatitis C Screening  Completed   HPV VACCINES  Aged Out   Meningococcal B Vaccine  Aged Out   Fecal DNA (Cologuard)  Discontinued    Health Maintenance  Health Maintenance Due  Topic Date Due   Zoster Vaccines- Shingrix (1 of 2) Never done   DTaP/Tdap/Td (2 - Tdap) 08/18/2017   DEXA SCAN  12/26/2022   COVID-19 Vaccine (3 -  2024-25 season) 04/05/2023   Health Maintenance Items Addressed: Declines dexa at this time; information provided   Additional Screening:  Vision Screening: Recommended annual ophthalmology exams for early detection of glaucoma and other disorders of the eye. Would you like a referral to an eye doctor? No    Dental Screening: Recommended annual dental exams for proper oral hygiene  Community Resource Referral / Chronic Care Management: CRR required this visit?  No   CCM required this visit?  No   Plan:    I have personally reviewed and noted the following in the patient's chart:   Medical and social history Use of alcohol, tobacco or illicit drugs  Current medications and supplements including opioid prescriptions. Patient is not currently taking opioid prescriptions. Functional ability and status Nutritional status Physical activity Advanced directives List of other physicians Hospitalizations, surgeries, and ER visits in previous 12 months Vitals Screenings to include cognitive, depression, and falls Referrals and appointments  In  addition, I have reviewed and discussed with patient certain preventive protocols, quality metrics, and best practice recommendations. A written personalized care plan for preventive services as well as general preventive health recommendations were provided to patient.   Seabron Cypress [place], [place]   04/08/2840   After Visit Summary: (MyChart) Due to this being a telephonic visit, the after visit summary with patients personalized plan was offered to patient via MyChart   Notes: Nothing significant to report at this time.

## 2024-01-04 NOTE — Patient Instructions (Signed)
 [name] , Thank you for taking time out of your busy schedule to complete your Annual Wellness Visit with me. I enjoyed our conversation and look forward to speaking with you again next year. I, as well as your care team,  appreciate your ongoing commitment to your health goals. Please review the following plan we discussed and let me know if I can assist you in the future. Your Game plan/ To Do List     Follow up Visits: Next Medicare AWV with our clinical staff: In  1 year    Have you seen your provider in the last 6 months (3 months if uncontrolled diabetes)? Yes Next Office Visit with your provider: To be scheduled   Clinician Recommendations:  Aim for 30 minutes of exercise or brisk walking, 6-8 glasses of water, and 5 servings of fruits and vegetables each day.       This is a list of the screening recommended for you and due dates:  Health Maintenance  Topic Date Due   Zoster (Shingles) Vaccine (1 of 2) Never done   DTaP/Tdap/Td vaccine (2 - Tdap) 08/18/2017   DEXA scan (bone density measurement)  12/26/2022   COVID-19 Vaccine (3 - 2024-25 season) 04/05/2023   Hemoglobin A1C  02/24/2024   Flu Shot  03/04/2024   Yearly kidney health urinalysis for diabetes  03/31/2024   Complete foot exam   03/31/2024   Eye exam for diabetics  04/09/2024   Mammogram  06/28/2024   Yearly kidney function blood test for diabetes  08/26/2024   Medicare Annual Wellness Visit  01/03/2025   Pneumonia Vaccine  Completed   Hepatitis C Screening  Completed   HPV Vaccine  Aged Out   Meningitis B Vaccine  Aged Out   Cologuard (Stool DNA test)  Discontinued    Advanced directives: (ACP Link)Information on Advanced Care Planning can be found at [place]  Secretary of [facility] Advance Health Care Directives Advance Health Care Directives. [email or web address]  Advance Care Planning is important because it:  [x]  Makes sure you receive the medical care that is consistent with your values, goals, and  preferences  [x]  It provides guidance to your family and loved ones and reduces their decisional burden about whether or not they are making the right decisions based on your wishes.  Follow the link provided in your after visit summary or read over the paperwork we have mailed to you to help you started getting your Advance Directives in place. If you need assistance in completing these, please reach out to us  so that we can help you!  See attachments for Preventive Care and Fall Prevention Tips.

## 2024-01-07 DIAGNOSIS — E1165 Type 2 diabetes mellitus with hyperglycemia: Secondary | ICD-10-CM | POA: Diagnosis not present

## 2024-01-14 ENCOUNTER — Encounter: Payer: Self-pay | Admitting: Family Medicine

## 2024-01-14 DIAGNOSIS — E1142 Type 2 diabetes mellitus with diabetic polyneuropathy: Secondary | ICD-10-CM | POA: Diagnosis not present

## 2024-01-14 DIAGNOSIS — E1165 Type 2 diabetes mellitus with hyperglycemia: Secondary | ICD-10-CM | POA: Diagnosis not present

## 2024-01-14 DIAGNOSIS — E782 Mixed hyperlipidemia: Secondary | ICD-10-CM | POA: Diagnosis not present

## 2024-01-27 ENCOUNTER — Encounter: Payer: Self-pay | Admitting: Physician Assistant

## 2024-01-27 ENCOUNTER — Ambulatory Visit: Admitting: Physician Assistant

## 2024-01-27 VITALS — BP 118/64 | HR 80 | Ht 63.5 in | Wt 129.0 lb

## 2024-01-27 DIAGNOSIS — K581 Irritable bowel syndrome with constipation: Secondary | ICD-10-CM

## 2024-01-27 MED ORDER — LINACLOTIDE 72 MCG PO CAPS
72.0000 ug | ORAL_CAPSULE | Freq: Every day | ORAL | 5 refills | Status: DC
Start: 2024-01-27 — End: 2024-04-28

## 2024-01-27 NOTE — Progress Notes (Signed)
 Chief Complaint: Diarrhea  HPI:    Wanda [name] is a 78 year old female with a past medical history as listed below including CKD, diabetes, anxiety, fibromyalgia and multiple others, known to Dr. Shila, who presents to clinic today with abdominal pain and some diarrhea as well as constipation.      03/2019 colonoscopy with Dr. Kristie with 2 small polyps removed, largest 7 mm.    12/2021 Cologuard negative.    01/30/2022 CT scan of the abdomen showed moderate to large stool burden.    09/02/2023 patient seen in clinic and at that time discussed that her elevated LFTs were resolving possibly secondary to atorvastatin , she still had some intermittent right lower quadrant pain suspected due to constipation +/- musculoskeletal component, intermittent constipation as well as GERD.  Recommend she continue MiraLAX as needed and Prilosec as needed.    Today, patient brings with her a calendar from the start of June, she has only had about 6 bowel movements since the beginning of June and has days of severe abdominal pain, one day so severe she could hardly move.  She is still using MiraLAX as needed and oftentimes forgets because she tells me she is going and busy from the second her feet to the floor in the morning and she just cannot make something up.  Does describe that occasionally though she will have at least 5 loose stools in the day.    Denies fever, chills or weight loss.  Past Medical History:  Diagnosis Date   Anxiety    CKD (chronic kidney disease)    Colon polyps    Depression    DM (diabetes mellitus) (HCC)    Female bladder prolapse    Fibromyalgia    neurofibromyalgia   Headache    HTN (hypertension)    Hyperlipidemia     Past Surgical History:  Procedure Laterality Date   COLONOSCOPY  03/28/2019   Dr Kristie   WRIST SURGERY  1969    Current Outpatient Medications  Medication Sig Dispense Refill   Accu-Chek Softclix Lancets lancets 2 (two) times daily.     acetaminophen   (TYLENOL ) 325 MG tablet Take 2 tablets by mouth as needed.     Blood Glucose Monitoring Suppl (ACCU-CHEK GUIDE ME) w/Device KIT USE AS DIRECTED TO CHECK BLOOD SUGAR TWICE DAILY     busPIRone  (BUSPAR ) 15 MG tablet Take 0.5 tablets (7.5 mg total) by mouth 2 (two) times daily.     Cholecalciferol (VITAMIN D3) 1.25 MG (50000 UT) CAPS Take 1 capsule (1.25 mg total) by mouth once a week. 12 capsule 0   ezetimibe (ZETIA) 10 MG tablet Take 1 tablet by mouth daily.     Insulin  Aspart FlexPen (NOVOLOG) 100 UNIT/ML Inject 4 Units into the skin daily before breakfast.     insulin  degludec (TRESIBA  FLEXTOUCH) 100 UNIT/ML FlexTouch Pen Inject 6 Units into the skin daily. 15 mL 3   Insulin  Pen Needle 32G X 4 MM MISC 1 Device by Does not apply route daily in the afternoon. 100 each 3   metFORMIN  (GLUCOPHAGE -XR) 500 MG 24 hr tablet Take 2 tablets (1,000 mg total) by mouth daily with breakfast. 180 tablet 3   MOUNJARO 2.5 MG/0.5ML Pen Inject 2.5 mg into the skin.     omeprazole  (PRILOSEC) 20 MG capsule TAKE 1 CAPSULE (20 MG TOTAL) BY MOUTH EVERY OTHER DAY. 45 capsule 1   Semaglutide ,0.25 or 0.5MG /DOS, 2 MG/3ML SOPN Inject 0.5 mg into the skin once a week. 9 mL 2  No current facility-administered medications for this visit.    Allergies as of 01/27/2024 - Review Complete 01/27/2024  Allergen Reaction Noted   Atorvastatin  Other (See Comments) 09/01/2023   Empagliflozin  Other (See Comments) 10/14/2023   Niacin  06/03/2010   Sulfamethoxazole-trimethoprim Itching 02/26/2021    Family History  Problem Relation Age of Onset   Diabetes Mother    Hypertension Mother    Hyperlipidemia Mother    Stroke Mother    Heart disease Mother    Heart disease Father    Diabetes Sister    Diabetes Sister    Breast cancer Sister 40 - 39   Heart disease Maternal Grandmother    Diabetes Maternal Grandfather    Parkinson's disease Brother    Heart attack Other    Colon cancer Neg Hx    Esophageal cancer Neg Hx      Social History   Socioeconomic History   Marital status: Widowed    Spouse name: Not on file   Number of children: 2   Years of education: Master's degree   Highest education level: Master's degree (e.g., MA, MS, MEng, MEd, MSW, MBA)  Occupational History    Comment: retired Runner, broadcasting/film/video  Tobacco Use   Smoking status: Never   Smokeless tobacco: Never  Vaping Use   Vaping status: Never Used  Substance and Sexual Activity   Alcohol use: No    Alcohol/week: 0.0 standard drinks of alcohol   Drug use: No   Sexual activity: Not Currently  Other Topics Concern   Not on file  Social History Narrative   Lives with husband    caffeine - none   01/16/20   From: [place]    Living: alone   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) - has gone back some and may return parttime      Family: Public house manager (mosiacism down's syndrome, lives in garage apartment) and Garnette       Enjoys: pickle-ball prior to covid, traveling with sisters, house projects      Exercise: not currently - wants to get back to walking   Diet: trying to reduce portions, limit soda      Safety   Seat belts: Yes    Guns: No   Safe in relationships: Yes    Social Drivers of Corporate investment banker Strain: Low Risk  (01/07/2024)   Received from [facility]   Overall Financial Resource Strain (CARDIA)    Difficulty of Paying Living Expenses: Not hard at all  Food Insecurity: No Food Insecurity (01/07/2024)   Received from [facility]   Hunger Vital Sign    Within the past 12 months, you worried that your food would run out before you got the money to buy more.: Never true    Within the past 12 months, the food you bought just didn't last and you didn't have money to get more.: Never true  Transportation Needs: No Transportation Needs (01/07/2024)   Received from [facility]    In the past 12 months, has lack of  transportation kept you from medical appointments or from getting medications?: No    Lack of Transportation (Non-Medical): No  Physical Activity: Inactive (01/04/2024)   Exercise Vital Sign    Days of Exercise per Week: 0 days    Minutes of Exercise per Session: 0 min  Stress: Stress Concern Present (01/04/2024)   [employer] of Occupational Health -  Occupational Stress Questionnaire    Feeling of Stress : To some extent  Social Connections: Moderately Integrated (01/04/2024)   Social Connection and Isolation Panel    Frequency of Communication with Friends and Family: More than three times a week    Frequency of Social Gatherings with Friends and Family: More than three times a week    Attends Religious Services: More than 4 times per year    Active Member of [employer] or Organizations: Yes    Attends Banker Meetings: 1 to 4 times per year    Marital Status: Widowed  Intimate Partner Violence: Not At Risk (01/04/2024)   Humiliation, Afraid, Rape, and Kick questionnaire    Fear of Current or Ex-Partner: No    Emotionally Abused: No    Physically Abused: No    Sexually Abused: No    Review of Systems:    Constitutional: No weight loss, fever, chills, weakness or fatigue Cardiovascular: No chest pain Respiratory: No SOB  Gastrointestinal: See HPI and otherwise negative   Physical Exam:  Vital signs: BP 118/64   Pulse 80   Ht 5' 3.5 (1.613 m)   Wt 129 lb (58.5 kg)   BMI 22.49 kg/m    Constitutional:   Pleasant AA female appears to be in NAD, Well developed, Well nourished, alert and cooperative Respiratory: Respirations even and unlabored. Lungs clear to auscultation bilaterally.   No wheezes, crackles, or rhonchi.  Cardiovascular: Normal S1, S2. No MRG. Regular rate and rhythm. No peripheral edema, cyanosis or pallor.  Gastrointestinal:  Soft, nondistended, nontender. No rebound or guarding. Normal bowel sounds. No appreciable masses or hepatomegaly. Rectal:  Not  performed.  Psychiatric: Oriented to person, place and time. Demonstrates good judgement and reason without abnormal affect or behaviors.  RELEVANT LABS AND IMAGING: CBC    Component Value Date/Time   WBC 8.0 08/27/2023 1623   RBC 4.01 08/27/2023 1623   HGB 12.5 08/27/2023 1623   HCT 38.1 08/27/2023 1623   PLT 298.0 08/27/2023 1623   MCV 94.9 08/27/2023 1623   MCH 30.1 06/23/2014 1520   MCHC 32.8 08/27/2023 1623   RDW 13.5 08/27/2023 1623   LYMPHSABS 1.9 08/27/2023 1623   MONOABS 0.8 08/27/2023 1623   EOSABS 0.3 08/27/2023 1623   BASOSABS 0.1 08/27/2023 1623    CMP     Component Value Date/Time   NA 138 08/27/2023 1623   NA 142 07/16/2021 0000   K 4.8 08/27/2023 1623   CL 105 08/27/2023 1623   CO2 26 08/27/2023 1623   GLUCOSE 274 (H) 08/27/2023 1623   BUN 20 08/27/2023 1623   BUN 22 (A) 07/16/2021 0000   CREATININE 1.01 08/27/2023 1623   CALCIUM  9.2 08/27/2023 1623   PROT 7.4 08/27/2023 1623   ALBUMIN 3.9 08/27/2023 1623   AST 16 08/27/2023 1623   ALT 16 08/27/2023 1623   ALKPHOS 120 (H) 08/27/2023 1623   BILITOT 0.3 08/27/2023 1623   GFRNONAA 57 (L) 06/23/2014 1520   GFRAA 67 (L) 06/23/2014 1520    Assessment: 1.  IBS-C: Likely diagnosis of IBS-C given abdominal cramping and sometimes a week in between bowel movements, patient does get occasional loose stools which are likely overflow constipation, we discussed this in detail today  Plan: 1.  Patient tells me that MiraLAX is too hard to take given that she has to mix this in liquid and she is just a busy person.  Will trial Linzess 72 mcg every morning, 30 minutes for breakfast #30  with 3 refills. 2.  Answered all the patient's questions. 3.  Patient to follow in clinic with me in 2 to 3 months or sooner if necessary.  Wanda Failing, PA-C [facility] 01/27/2024, 2:55 PM  Cc: Avelina Greig BRAVO, MD

## 2024-01-27 NOTE — Patient Instructions (Signed)
 We have sent the following medications to your pharmacy for you to pick up at your convenience: Linzess 72 mcg daily before breakfast.   _______________________________________________________  If your blood pressure at your visit was 140/90 or greater, please contact your primary care physician to follow up on this.  _______________________________________________________  If you are age 76 or older, your body mass index should be between 23-30. Your Body mass index is 22.49 kg/m. If this is out of the aforementioned range listed, please consider follow up with your Primary Care Provider.  If you are age 53 or younger, your body mass index should be between 19-25. Your Body mass index is 22.49 kg/m. If this is out of the aformentioned range listed, please consider follow up with your Primary Care Provider.   ________________________________________________________  The [facility] providers would like to encourage you to use MYCHART to communicate with providers for non-urgent requests or questions.  Due to long hold times on the telephone, sending your provider a message by [facility] may be a faster and more efficient way to get a response.  Please allow 48 business hours for a response.  Please remember that this is for non-urgent requests.  _______________________________________________________

## 2024-02-02 ENCOUNTER — Other Ambulatory Visit: Payer: Self-pay | Admitting: Family Medicine

## 2024-02-02 NOTE — Telephone Encounter (Signed)
 Last office visit 11/10/23 for B12 deficiency, fatigue and chronic diarrhea.  Last refilled 11/12/23 for #12 with no refills.  Vit D level 11/11/23 low at 18.08 ng/mL.  Next Appt: No future appointments with PCP.

## 2024-04-07 DIAGNOSIS — E782 Mixed hyperlipidemia: Secondary | ICD-10-CM | POA: Diagnosis not present

## 2024-04-07 DIAGNOSIS — E1165 Type 2 diabetes mellitus with hyperglycemia: Secondary | ICD-10-CM | POA: Diagnosis not present

## 2024-04-07 DIAGNOSIS — E1142 Type 2 diabetes mellitus with diabetic polyneuropathy: Secondary | ICD-10-CM | POA: Diagnosis not present

## 2024-04-28 ENCOUNTER — Encounter: Payer: Self-pay | Admitting: Physician Assistant

## 2024-04-28 ENCOUNTER — Ambulatory Visit: Admitting: Physician Assistant

## 2024-04-28 VITALS — BP 114/78 | HR 83 | Ht 63.5 in | Wt 136.6 lb

## 2024-04-28 DIAGNOSIS — K581 Irritable bowel syndrome with constipation: Secondary | ICD-10-CM

## 2024-04-28 NOTE — Progress Notes (Signed)
 Chief Complaint: Constipation and abdominal pain  HPI:    Wanda [name] is a 78 year old female with a past medical history as listed below including CKD, diabetes, anxiety, fibromyalgia and IBS, known to Dr. Shila, who returns to clinic today for follow-up of constipation and abdominal pain.  03/2019 colonoscopy with Dr. Kristie with 2 small polyps removed, largest 7 mm.    12/2021 Cologuard negative.    01/30/2022 CT scan of the abdomen showed moderate to large stool burden.    09/02/2023 patient seen in clinic and at that time discussed that her elevated LFTs were resolving possibly secondary to atorvastatin , she still had some intermittent right lower quadrant pain suspected due to constipation +/- musculoskeletal component, intermittent constipation as well as GERD.  Recommend she continue MiraLAX as needed and Prilosec as needed.    01/07/2024 normal TSH and T4.    01/27/2024 patient seen in clinic and at that time discussed only having 6 bowel movements over a span of 25 days.  She is using MiraLAX as needed and often forgot.  Discussed likely IBS-C.  Trialed Linzess  72 mcg every morning as Linzess  as MiraLAX is too hard to manage.    04/07/24 hemoglobin A1c 7.1.    Today, the patient presents to clinic and unfortunately she took 1 dose of Linzess  72 mcg and I kept her in the bathroom for almost 24 hours with liquid urgent stools.  She did not take anymore and is back to her regular constipation with occasional pain and cramping in her abdomen related to this.  Again discusses that it is hard for her to remember medications, but she is now aware she may need to try in order to make things better for herself.  Also does not do a great job at drinking water for her.    Denies fever, chills or weight loss.  Past Medical History:  Diagnosis Date   Anxiety    CKD (chronic kidney disease)    Colon polyps    Depression    DM (diabetes mellitus) (HCC)    Female bladder prolapse    Fibromyalgia     neurofibromyalgia   Headache    HTN (hypertension)    Hyperlipidemia     Past Surgical History:  Procedure Laterality Date   COLONOSCOPY  03/28/2019   Dr Kristie   WRIST SURGERY  1969    Current Outpatient Medications  Medication Sig Dispense Refill   Accu-Chek Softclix Lancets lancets 2 (two) times daily.     acetaminophen  (TYLENOL ) 325 MG tablet Take 2 tablets by mouth as needed.     Blood Glucose Monitoring Suppl (ACCU-CHEK GUIDE ME) w/Device KIT USE AS DIRECTED TO CHECK BLOOD SUGAR TWICE DAILY     busPIRone  (BUSPAR ) 15 MG tablet Take 0.5 tablets (7.5 mg total) by mouth 2 (two) times daily.     Cholecalciferol (VITAMIN D3) 1.25 MG (50000 UT) CAPS TAKE 1 CAPSULE BY MOUTH ONE TIME PER WEEK 12 capsule 0   ezetimibe (ZETIA) 10 MG tablet Take 1 tablet by mouth daily.     Insulin  Aspart FlexPen (NOVOLOG) 100 UNIT/ML Inject 4 Units into the skin daily before breakfast.     insulin  degludec (TRESIBA  FLEXTOUCH) 100 UNIT/ML FlexTouch Pen Inject 6 Units into the skin daily. 15 mL 3   Insulin  Pen Needle 32G X 4 MM MISC 1 Device by Does not apply route daily in the afternoon. 100 each 3   linaclotide  (LINZESS ) 72 MCG capsule Take 1 capsule (72 mcg total)  by mouth daily before breakfast. 30 capsule 5   metFORMIN  (GLUCOPHAGE -XR) 500 MG 24 hr tablet Take 2 tablets (1,000 mg total) by mouth daily with breakfast. 180 tablet 3   MOUNJARO 2.5 MG/0.5ML Pen Inject 2.5 mg into the skin.     omeprazole  (PRILOSEC) 20 MG capsule TAKE 1 CAPSULE (20 MG TOTAL) BY MOUTH EVERY OTHER DAY. 45 capsule 1   Semaglutide ,0.25 or 0.5MG /DOS, 2 MG/3ML SOPN Inject 0.5 mg into the skin once a week. 9 mL 2   No current facility-administered medications for this visit.    Allergies as of 04/28/2024 - Review Complete 04/28/2024  Allergen Reaction Noted   Atorvastatin  Other (See Comments) 09/01/2023   Empagliflozin  Other (See Comments) 10/14/2023   Niacin  06/03/2010   Sulfamethoxazole-trimethoprim Itching 02/26/2021     Family History  Problem Relation Age of Onset   Diabetes Mother    Hypertension Mother    Hyperlipidemia Mother    Stroke Mother    Heart disease Mother    Heart disease Father    Diabetes Sister    Diabetes Sister    Breast cancer Sister 43 - 70   Heart disease Maternal Grandmother    Diabetes Maternal Grandfather    Parkinson's disease Brother    Heart attack Other    Colon cancer Neg Hx    Esophageal cancer Neg Hx     Social History   Socioeconomic History   Marital status: Widowed    Spouse name: Not on file   Number of children: 2   Years of education: Master's degree   Highest education level: Master's degree (e.g., MA, MS, MEng, MEd, MSW, MBA)  Occupational History    Comment: retired Runner, broadcasting/film/video  Tobacco Use   Smoking status: Never   Smokeless tobacco: Never  Vaping Use   Vaping status: Never Used  Substance and Sexual Activity   Alcohol use: No    Alcohol/week: 0.0 standard drinks of alcohol   Drug use: No   Sexual activity: Not Currently  Other Topics Concern   Not on file  Social History Narrative   Lives with husband    caffeine - none   01/16/20   From: [place]    Living: alone   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   Overall Financial Resource Strain (CARDIA)    Difficulty of Paying Living Expenses: Not very hard  Food Insecurity: No Food Insecurity (04/06/2024)   Received from [facility]    Hunger Vital Sign    Within the past 12 months, you worried that your food would run out before you got the money to buy more.: Never true    Within the past 12 months, the food you bought just didn't last and you didn't have money to get more.: Never true  Transportation Needs: No Transportation Needs (04/06/2024)  Received from [facility] Transportation    In the past 12 months, has lack of transportation kept you from medical appointments or from getting medications?: No    Lack of Transportation (Non-Medical): No  Physical Activity: Inactive (01/04/2024)   Exercise Vital Sign    Days of Exercise per Week: 0 days    Minutes of Exercise per Session: 0 min  Stress: Stress Concern Present (01/04/2024)   [employer] of Occupational Health - Occupational Stress Questionnaire    Feeling of Stress : To some extent  Social Connections: Moderately Integrated (01/04/2024)   Social Connection and Isolation Panel    Frequency of Communication with Friends and Family: More than three times a week    Frequency of Social Gatherings with Friends and Family: More than three times a week    Attends Religious Services: More than 4 times per year    Active Member of [employer] or Organizations: Yes    Attends Banker Meetings: 1 to 4 times per year    Marital Status: Widowed  Intimate Partner Violence: Not At Risk (01/04/2024)   Humiliation, Afraid, Rape, and Kick questionnaire    Fear of Current or Ex-Partner: No    Emotionally Abused: No    Physically Abused: No    Sexually Abused: No    Review of Systems:    Constitutional: No weight loss, fever or chills Cardiovascular: No chest pain Respiratory: No SOB  Gastrointestinal: See HPI and otherwise negative   Physical Exam:  Vital signs: BP 114/78   Pulse 83   Ht 5' 3.5 (1.613 m)   Wt 136 lb 9.6 oz (62 kg)   BMI 23.82 kg/m    Constitutional:   Pleasant Caucasian female appears to be in NAD, Well  developed, Well nourished, alert and cooperative Respiratory: Respirations even and unlabored. Lungs clear to auscultation bilaterally.   No wheezes, crackles, or rhonchi.  Cardiovascular: Normal S1, S2. No MRG. Regular rate and rhythm. No peripheral edema, cyanosis or pallor.  Gastrointestinal:  Soft, nondistended, mild bilateral lower abdominal TTP. No rebound or guarding. Normal bowel sounds. No appreciable masses or hepatomegaly. Rectal:  Not performed.  Psychiatric: Demonstrates good judgement and reason without abnormal affect or behaviors.  No recent labs or imaging.  Assessment: 1.  IBS-C: describes abdominal cramping and gas pains with a bowel movement maybe once a week, could not take daily MiraLAX due to not liking this and not remembering it, tried Linzess  72 mcg which was too strong for her, up-to-date with colonoscopy last in 2020  Plan: 1.  Discussed with patient that the only way to get better is to be more diligent about her water intake at least 6-8 ounce glasses a day and a laxative.  She definitely does not want to use MiraLAX.  Explained that I think all of the prescription medications will be too strong for her.  It really just will require her to be more diligent and consistent with something. 2.  Patient would like to try Colace daily, discussed that if once daily is not enough then could increase this to twice daily.  If Colace does not work then could try just a Metamucil wafer which sounds more appealing to her. 3.  Patient will stay in touch via MyChart in the interim so we can try and navigate some of this without having her to come back in clinic.  Otherwise scheduled her for follow-up in 3 months.  Wanda Failing, PA-C  [facility] 04/28/2024, 2:48 PM  Cc: Wanda Greig BRAVO, MD

## 2024-04-28 NOTE — Patient Instructions (Signed)
 Take Colace everyday.   Drink 64 oz of water daily as tolerated.   Follow-up in 3 months. Office will contact you to schedule at a later time.   _______________________________________________________  If your blood pressure at your visit was 140/90 or greater, please contact your primary care physician to follow up on this.  _______________________________________________________  If you are age 76 or older, your body mass index should be between 23-30. Your Body mass index is 23.82 kg/m. If this is out of the aforementioned range listed, please consider follow up with your Primary Care Provider.  If you are age 20 or younger, your body mass index should be between 19-25. Your Body mass index is 23.82 kg/m. If this is out of the aformentioned range listed, please consider follow up with your Primary Care Provider.   ________________________________________________________  The [facility] providers would like to encourage you to use MYCHART to communicate with providers for non-urgent requests or questions.  Due to long hold times on the telephone, sending your provider a message by [facility] may be a faster and more efficient way to get a response.  Please allow 48 business hours for a response.  Please remember that this is for non-urgent requests.  _______________________________________________________  [facility] is using a team-based approach to care.  Your team is made up of your doctor and two to three APPS. Our APPS (Nurse Practitioners and Physician Assistants) work with your physician to ensure care continuity for you. They are fully qualified to address your health concerns and develop a treatment plan. They communicate directly with your gastroenterologist to care for you. Seeing the Advanced Practice Practitioners on your physician's team can help you by facilitating care more promptly, often allowing for earlier appointments, access to diagnostic testing, procedures,  and other specialty referrals.   Thank you for choosing me and  Gastroenterology.  Delon Failing, PA-C

## 2024-05-02 DIAGNOSIS — E1165 Type 2 diabetes mellitus with hyperglycemia: Secondary | ICD-10-CM | POA: Diagnosis not present

## 2024-05-06 DIAGNOSIS — H25813 Combined forms of age-related cataract, bilateral: Secondary | ICD-10-CM | POA: Diagnosis not present

## 2024-05-06 DIAGNOSIS — E109 Type 1 diabetes mellitus without complications: Secondary | ICD-10-CM | POA: Diagnosis not present

## 2024-05-06 DIAGNOSIS — H524 Presbyopia: Secondary | ICD-10-CM | POA: Diagnosis not present

## 2024-05-06 DIAGNOSIS — H5213 Myopia, bilateral: Secondary | ICD-10-CM | POA: Diagnosis not present

## 2024-05-18 ENCOUNTER — Other Ambulatory Visit: Payer: Self-pay | Admitting: Family Medicine

## 2024-05-18 DIAGNOSIS — Z1231 Encounter for screening mammogram for malignant neoplasm of breast: Secondary | ICD-10-CM

## 2024-06-13 DIAGNOSIS — F411 Generalized anxiety disorder: Secondary | ICD-10-CM | POA: Diagnosis not present

## 2024-06-13 DIAGNOSIS — E1122 Type 2 diabetes mellitus with diabetic chronic kidney disease: Secondary | ICD-10-CM | POA: Diagnosis not present

## 2024-06-13 DIAGNOSIS — E785 Hyperlipidemia, unspecified: Secondary | ICD-10-CM | POA: Diagnosis not present

## 2024-06-13 DIAGNOSIS — E1121 Type 2 diabetes mellitus with diabetic nephropathy: Secondary | ICD-10-CM | POA: Diagnosis not present

## 2024-06-13 DIAGNOSIS — Z833 Family history of diabetes mellitus: Secondary | ICD-10-CM | POA: Diagnosis not present

## 2024-06-13 DIAGNOSIS — E1136 Type 2 diabetes mellitus with diabetic cataract: Secondary | ICD-10-CM | POA: Diagnosis not present

## 2024-06-13 DIAGNOSIS — E1142 Type 2 diabetes mellitus with diabetic polyneuropathy: Secondary | ICD-10-CM | POA: Diagnosis not present

## 2024-06-13 DIAGNOSIS — Z8249 Family history of ischemic heart disease and other diseases of the circulatory system: Secondary | ICD-10-CM | POA: Diagnosis not present

## 2024-06-13 DIAGNOSIS — Z794 Long term (current) use of insulin: Secondary | ICD-10-CM | POA: Diagnosis not present

## 2024-06-17 ENCOUNTER — Other Ambulatory Visit: Payer: Self-pay | Admitting: Family Medicine

## 2024-06-17 NOTE — Telephone Encounter (Signed)
 Please schedule CPE with fasting labs prior for Dr. Ermalene Searing.

## 2024-07-08 ENCOUNTER — Ambulatory Visit

## 2024-08-03 ENCOUNTER — Ambulatory Visit

## 2024-08-09 ENCOUNTER — Ambulatory Visit
Admission: RE | Admit: 2024-08-09 | Discharge: 2024-08-09 | Disposition: A | Source: Ambulatory Visit | Attending: Family Medicine | Admitting: Family Medicine

## 2024-08-09 DIAGNOSIS — Z1231 Encounter for screening mammogram for malignant neoplasm of breast: Secondary | ICD-10-CM

## 2024-08-11 ENCOUNTER — Ambulatory Visit: Payer: Self-pay | Admitting: Family Medicine

## 2025-01-04 ENCOUNTER — Ambulatory Visit
